# Patient Record
Sex: Female | Born: 1940 | Race: White | Hispanic: No | Marital: Married | Smoking: Never smoker
Health system: Southern US, Community
[De-identification: ages and names within clinical notes are randomized; demographics above are authoritative.]

## PROBLEM LIST (undated history)

## (undated) DIAGNOSIS — K449 Diaphragmatic hernia without obstruction or gangrene: Secondary | ICD-10-CM

## (undated) DIAGNOSIS — N3010 Interstitial cystitis (chronic) without hematuria: Secondary | ICD-10-CM

## (undated) DIAGNOSIS — G20A1 Parkinson's disease without dyskinesia, without mention of fluctuations: Secondary | ICD-10-CM

## (undated) DIAGNOSIS — N200 Calculus of kidney: Secondary | ICD-10-CM

## (undated) DIAGNOSIS — D649 Anemia, unspecified: Secondary | ICD-10-CM

## (undated) DIAGNOSIS — M545 Low back pain, unspecified: Secondary | ICD-10-CM

## (undated) DIAGNOSIS — R002 Palpitations: Secondary | ICD-10-CM

## (undated) DIAGNOSIS — T8131XA Disruption of external operation (surgical) wound, not elsewhere classified, initial encounter: Secondary | ICD-10-CM

## (undated) DIAGNOSIS — F988 Other specified behavioral and emotional disorders with onset usually occurring in childhood and adolescence: Secondary | ICD-10-CM

## (undated) DIAGNOSIS — Z8719 Personal history of other diseases of the digestive system: Secondary | ICD-10-CM

## (undated) DIAGNOSIS — G20 Parkinson's disease: Secondary | ICD-10-CM

## (undated) DIAGNOSIS — I639 Cerebral infarction, unspecified: Secondary | ICD-10-CM

## (undated) DIAGNOSIS — K225 Diverticulum of esophagus, acquired: Secondary | ICD-10-CM

## (undated) DIAGNOSIS — Z87440 Personal history of urinary (tract) infections: Secondary | ICD-10-CM

## (undated) DIAGNOSIS — J189 Pneumonia, unspecified organism: Secondary | ICD-10-CM

## (undated) DIAGNOSIS — R634 Abnormal weight loss: Secondary | ICD-10-CM

## (undated) DIAGNOSIS — K222 Esophageal obstruction: Secondary | ICD-10-CM

## (undated) DIAGNOSIS — K219 Gastro-esophageal reflux disease without esophagitis: Secondary | ICD-10-CM

## (undated) HISTORY — DX: Parkinson's disease: G20

## (undated) HISTORY — DX: Interstitial cystitis (chronic) without hematuria: N30.10

## (undated) HISTORY — DX: Low back pain: M54.5

## (undated) HISTORY — DX: Esophageal obstruction: K22.2

## (undated) HISTORY — PX: UPPER GASTROINTESTINAL ENDOSCOPY: SHX188

## (undated) HISTORY — PX: SEPTOPLASTY: SUR1290

## (undated) HISTORY — DX: Parkinson's disease without dyskinesia, without mention of fluctuations: G20.A1

## (undated) HISTORY — DX: Diverticulum of esophagus, acquired: K22.5

## (undated) HISTORY — DX: Personal history of urinary (tract) infections: Z87.440

## (undated) HISTORY — DX: Diaphragmatic hernia without obstruction or gangrene: K44.9

## (undated) HISTORY — DX: Other specified behavioral and emotional disorders with onset usually occurring in childhood and adolescence: F98.8

## (undated) HISTORY — PX: VAGINAL HYSTERECTOMY: SUR661

## (undated) HISTORY — DX: Anemia, unspecified: D64.9

## (undated) HISTORY — DX: Pneumonia, unspecified organism: J18.9

## (undated) HISTORY — DX: Disruption of external operation (surgical) wound, not elsewhere classified, initial encounter: T81.31XA

## (undated) HISTORY — DX: Palpitations: R00.2

## (undated) HISTORY — DX: Low back pain, unspecified: M54.50

## (undated) HISTORY — PX: ESOPHAGOGASTRODUODENOSCOPY: SHX1529

## (undated) HISTORY — PX: COLONOSCOPY: SHX174

## (undated) HISTORY — DX: Abnormal weight loss: R63.4

---

## 1998-02-15 ENCOUNTER — Other Ambulatory Visit: Admission: RE | Admit: 1998-02-15 | Discharge: 1998-02-15 | Payer: Self-pay | Admitting: Obstetrics and Gynecology

## 1999-02-01 ENCOUNTER — Encounter: Admission: RE | Admit: 1999-02-01 | Discharge: 1999-02-01 | Payer: Self-pay | Admitting: Neurosurgery

## 1999-02-01 ENCOUNTER — Encounter: Payer: Self-pay | Admitting: Neurosurgery

## 1999-02-03 ENCOUNTER — Encounter: Payer: Self-pay | Admitting: Neurosurgery

## 1999-02-03 ENCOUNTER — Inpatient Hospital Stay (HOSPITAL_COMMUNITY): Admission: RE | Admit: 1999-02-03 | Discharge: 1999-02-04 | Payer: Self-pay | Admitting: Neurosurgery

## 1999-05-30 ENCOUNTER — Other Ambulatory Visit: Admission: RE | Admit: 1999-05-30 | Discharge: 1999-05-30 | Payer: Self-pay | Admitting: Obstetrics and Gynecology

## 1999-12-22 ENCOUNTER — Encounter: Admission: RE | Admit: 1999-12-22 | Discharge: 1999-12-22 | Payer: Self-pay | Admitting: Neurosurgery

## 1999-12-22 ENCOUNTER — Encounter: Payer: Self-pay | Admitting: Neurosurgery

## 2000-08-03 ENCOUNTER — Encounter: Payer: Self-pay | Admitting: Obstetrics and Gynecology

## 2000-08-03 ENCOUNTER — Encounter: Admission: RE | Admit: 2000-08-03 | Discharge: 2000-08-03 | Payer: Self-pay | Admitting: Obstetrics and Gynecology

## 2000-10-05 ENCOUNTER — Ambulatory Visit (HOSPITAL_COMMUNITY): Admission: RE | Admit: 2000-10-05 | Discharge: 2000-10-05 | Payer: Self-pay | Admitting: *Deleted

## 2000-10-05 ENCOUNTER — Encounter: Payer: Self-pay | Admitting: *Deleted

## 2003-02-23 ENCOUNTER — Inpatient Hospital Stay (HOSPITAL_COMMUNITY): Admission: EM | Admit: 2003-02-23 | Discharge: 2003-02-24 | Payer: Self-pay | Admitting: Emergency Medicine

## 2003-07-05 ENCOUNTER — Ambulatory Visit (HOSPITAL_BASED_OUTPATIENT_CLINIC_OR_DEPARTMENT_OTHER): Admission: RE | Admit: 2003-07-05 | Discharge: 2003-07-05 | Payer: Self-pay | Admitting: Family Medicine

## 2003-08-07 ENCOUNTER — Encounter: Admission: RE | Admit: 2003-08-07 | Discharge: 2003-08-07 | Payer: Self-pay | Admitting: Family Medicine

## 2003-10-09 ENCOUNTER — Ambulatory Visit (HOSPITAL_COMMUNITY): Admission: RE | Admit: 2003-10-09 | Discharge: 2003-10-09 | Payer: Self-pay | Admitting: Otolaryngology

## 2005-02-07 ENCOUNTER — Ambulatory Visit: Payer: Self-pay | Admitting: Urology

## 2006-02-05 ENCOUNTER — Encounter: Admission: RE | Admit: 2006-02-05 | Discharge: 2006-02-05 | Payer: Self-pay | Admitting: Family Medicine

## 2006-02-10 ENCOUNTER — Emergency Department: Payer: Self-pay | Admitting: Emergency Medicine

## 2006-02-12 ENCOUNTER — Ambulatory Visit: Payer: Self-pay | Admitting: Family Medicine

## 2006-09-17 ENCOUNTER — Emergency Department: Payer: Self-pay | Admitting: Emergency Medicine

## 2007-01-04 ENCOUNTER — Ambulatory Visit: Payer: Self-pay | Admitting: Family Medicine

## 2007-01-11 ENCOUNTER — Ambulatory Visit: Payer: Self-pay | Admitting: Family Medicine

## 2009-08-18 ENCOUNTER — Ambulatory Visit: Payer: Self-pay | Admitting: Family Medicine

## 2011-01-30 ENCOUNTER — Encounter: Payer: Self-pay | Admitting: Internal Medicine

## 2011-02-22 ENCOUNTER — Encounter: Payer: Self-pay | Admitting: Internal Medicine

## 2011-02-22 ENCOUNTER — Ambulatory Visit (INDEPENDENT_AMBULATORY_CARE_PROVIDER_SITE_OTHER): Payer: Medicare Other | Admitting: Internal Medicine

## 2011-02-22 VITALS — BP 108/62 | HR 80 | Ht 65.5 in | Wt 195.2 lb

## 2011-02-22 DIAGNOSIS — K219 Gastro-esophageal reflux disease without esophagitis: Secondary | ICD-10-CM

## 2011-02-22 DIAGNOSIS — IMO0001 Reserved for inherently not codable concepts without codable children: Secondary | ICD-10-CM

## 2011-02-22 DIAGNOSIS — R6881 Early satiety: Secondary | ICD-10-CM

## 2011-02-22 DIAGNOSIS — R634 Abnormal weight loss: Secondary | ICD-10-CM

## 2011-02-22 NOTE — Progress Notes (Signed)
Subjective:    Patient ID: Stephanie [name], female    DOB: [birth year], 70 y.o.   MRN: [id number]  HPI This is a delightful 70 year old married white woman, a self-described tennis addict, who is here to discuss problems with reflux and early satiety. About 5 months ago, after a long history of good control of reflux using Prilosec 20 mg daily, she started having increasing regurgitation and reflux. She began belching more, and especially noticed this when she slept on her abdomen which she typically does. She tried diet modification. She began to notice early satiety and has lost about 12-15 pounds in the last several months. Sometimes she has a foul odor when she belches. Frankly she had been on Advil for a number of years, to help with her back pain, but her back pain felt better and she stopped Advil 5 months ago, about the time these problems began.  In the past she has had dysphagia and esophageal stricture dilated. She feels confident she has had a colonoscopy in the past 5 years though I don't have those records and cannot find them. She says there is minimal nausea and minimal diarrhea but overall no other GI symptoms, though I should say she does have some anorexia. She takes in minimal caffeine but says "I do eat chocolate".  .Review of Systems Positive for the back pain disappearing 5 months ago, she has some cough. She plays tennis without difficulty 5 days a week sometimes 2 times a day.  Allergies  Allergen Reactions  . Sulfa Antibiotics Rash   No outpatient prescriptions prior to visit.   Past Medical History  Diagnosis Date  . Interstitial cystitis   . Pneumonia   . Hx: UTI (urinary tract infection)    Past Surgical History  Procedure Date  . Back surgery     x 2  . Vaginal hysterectomy   . Cholecystectomy   . Septoplasty    History   Social History  . Marital Status: Married                 Social History Main Topics  . Smoking status: Never Smoker   .  Smokeless tobacco: Never Used  . Alcohol Use: Yes     4 ounces of wine daily  . Drug Use: No  .                 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 Colon cancer Neg Hx   . Ulcers Paternal Grandmother   . Ulcers Father   . Alcohol abuse Father           Objective:   Physical Exam General:  Well-developed, well-nourished and in no acute distress obese Eyes:  anicteric. ENT:   Mouth and posterior pharynx free of lesions.  Neck:   supple w/o thyromegaly or mass.  Lungs: Clear to auscultation bilaterally. Heart:  S1S2, no rubs, murmurs, gallops. Abdomen:  soft, non-tender, no hepatosplenomegaly, hernia, or mass and BS+. Small umbilical hernia Rectal: Lymph:  no cervical or supraclavicular adenopathy. Extremities:   no edema Skin   no rash. Neuro:  A&O x 3.  Psych:  appropriate mood and  Affect.   Data Reviewed: Prior operative notes hospitalization records        Assessment & Plan:  She has a constellation of early satiety and weight loss and increasing reflux symptoms. She needs an upper GI endoscopy  to look for the possibility of a gastric cancer, or other upper GI tract malignancy. It is statistically unlikely it certainly possible. She could have gastritis, peptic ulcer disease or some functional disturbance of the upper gut. We'll plan on possible esophageal dilation that she is not having overt dysphagia sometimes the symptoms can be a manifestation of an esophageal stricture though I don't think so.  The risks and benefits as well as alternatives of endoscopic procedure(s) have been discussed and reviewed. [name] questions answered. The patient agrees to proceed.

## 2011-02-22 NOTE — Patient Instructions (Signed)
You have been scheduled for an Endoscopy with separate instructions given.  

## 2011-02-23 ENCOUNTER — Ambulatory Visit (AMBULATORY_SURGERY_CENTER): Payer: Medicare Other | Admitting: Internal Medicine

## 2011-02-23 ENCOUNTER — Encounter: Payer: Self-pay | Admitting: Internal Medicine

## 2011-02-23 DIAGNOSIS — K259 Gastric ulcer, unspecified as acute or chronic, without hemorrhage or perforation: Secondary | ICD-10-CM

## 2011-02-23 DIAGNOSIS — R6881 Early satiety: Secondary | ICD-10-CM

## 2011-02-23 DIAGNOSIS — K2960 Other gastritis without bleeding: Secondary | ICD-10-CM

## 2011-02-23 DIAGNOSIS — K449 Diaphragmatic hernia without obstruction or gangrene: Secondary | ICD-10-CM

## 2011-02-23 DIAGNOSIS — K219 Gastro-esophageal reflux disease without esophagitis: Secondary | ICD-10-CM

## 2011-02-23 DIAGNOSIS — R634 Abnormal weight loss: Secondary | ICD-10-CM

## 2011-02-23 MED ORDER — SODIUM CHLORIDE 0.9 % IV SOLN: 500.0000 mL | INTRAVENOUS | Status: DC

## 2011-02-23 NOTE — Op Note (Signed)
[facility] [address] [place], [place]  [address]  ENDOSCOPY PROCEDURE REPORT  PATIENT:  Stephanie Bell, Stephanie Bell  MR#:  [id number] BIRTHDATE:  Jan 28, 1941, 70 yrs. old  GENDER:  female  ENDOSCOPIST:  Iva Boop, MD, [facility]  PROCEDURE DATE:  02/23/2011 PROCEDURE:  EGD, diagnostic [phone] ASA CLASS:  Class II INDICATIONS:  early satiety, weight loss, reflux symptoms despite therapy  MEDICATIONS:   These medications were titrated to patient response per physician's verbal order, Fentanyl 50 mcg IV, Versed 5 mg IV TOPICAL ANESTHETIC:  Cetacaine Spray  DESCRIPTION OF PROCEDURE:   After the risks benefits and alternatives of the procedure were thoroughly explained, informed consent was obtained.  The LB GIF-H180 [id number] endoscope was introduced through the mouth and advanced to the second portion of the duodenum, without limitations.  The instrument was slowly withdrawn as the mucosa was fully examined. <<PROCEDUREIMAGES>>  A hiatal hernia was found. It was 12 cm in size.  Multiple erosions were found in the body of the stomach. Linear erosions where diaphragm impinges upon gastric body (hiatal hernia). tortuous in the total esophagus.  Otherwise the examination was normal.    Retroflexed views revealed a hiatal hernia.    The scope was then withdrawn from the patient and the procedure completed.  COMPLICATIONS:  None  ENDOSCOPIC IMPRESSION: 1) 12 cm hiatal hernia 2) Erosions, multiple in the body of the stomach - related to diaphragmatic impingement ("Cameron's erosions") 3) Tortuous in the total esophagus 4) Otherwise normal examination RECOMMENDATIONS: Upper GI series to better characterize this hiatal hernia. Suspect she will need surgical evaluation to consider hiatal hernia repair.  Iva Boop, MD, [facility]  CC:  The Patient and [name], MD  n. Rosalie Doctor:   Iva Boop at 02/23/2011 03:09 PM  Everardo All, [phone]

## 2011-02-23 NOTE — Patient Instructions (Addendum)
You have a very large hiatal hernia. I think that may be the cause of your problems. My office will contact you about having a test called an upper GI series to get more information about this. It may be in your best interest to have this surgically repaired. I will let you know after the x-rays.  Iva Boop, MD, [facility]   FOLLOW DISCHARGE INSTRUCTIONS (BLUE & GREEN SHEETS).  Information on hiatal hernia given to you

## 2011-02-23 NOTE — Progress Notes (Signed)
Patient did not experience any of the following events: a burn prior to discharge; a fall within the facility; wrong site/side/patient/procedure/implant event; or a hospital transfer or hospital admission upon discharge from the facility. (G8907) Patient did not have preoperative order for IV antibiotic SSI prophylaxis. (G8918)  

## 2011-02-24 ENCOUNTER — Telehealth: Payer: Self-pay | Admitting: *Deleted

## 2011-02-24 NOTE — Telephone Encounter (Signed)
Follow up Call- Patient questions:  Do you have a fever, pain , or abdominal swelling? no Pain Score  0 *  Have you tolerated food without any problems? yes  Have you been able to return to your normal activities? yes  Do you have any questions about your discharge instructions: Diet   no Medications  no Follow up visit  no  Do you have questions or concerns about your Care? no  Actions: * If pain score is 4 or above: No action needed, pain <4.  Answered questions about the findings of the hiatal hernia and wanted to know if the Barium enema was invasive. Explained to her what the barium enema was and she verbalized understanding.

## 2011-02-27 ENCOUNTER — Telehealth: Payer: Self-pay | Admitting: *Deleted

## 2011-02-27 NOTE — Telephone Encounter (Signed)
Patient called in requesting a appointment with dr.magrinat patient has never seen the dr. Before patient stated she plays tennis with dr.magrinat and he stated for her to come in and see him explained to the patient that there is a process and I would get back with her after I talk to dr.magrinat

## 2011-02-28 ENCOUNTER — Other Ambulatory Visit: Payer: Medicare Other | Admitting: Lab

## 2011-02-28 ENCOUNTER — Ambulatory Visit: Payer: Medicare Other

## 2011-02-28 ENCOUNTER — Ambulatory Visit (HOSPITAL_BASED_OUTPATIENT_CLINIC_OR_DEPARTMENT_OTHER): Payer: Medicare Other | Admitting: Oncology

## 2011-02-28 VITALS — BP 135/78 | HR 80 | Temp 97.6°F | Ht 65.0 in | Wt 192.4 lb

## 2011-02-28 DIAGNOSIS — N61 Mastitis without abscess: Secondary | ICD-10-CM

## 2011-02-28 DIAGNOSIS — L0390 Cellulitis, unspecified: Secondary | ICD-10-CM

## 2011-02-28 NOTE — Progress Notes (Signed)
CC: Stephanie Bell    HPI: This is a 70 year old gets and the woman presenting today for concerns regarding a new chest wall mass.  The patient recently establish yourself with Stephanie Bell, who performed a colonoscopy and upper GI as detailed below. Shortly afterward, on December 6, she noted a new "bump" in the middle of her chest, which grew rapidly, was "black and blue and hot". She was very concerned that this might represent breast cancer. We were glad to be able to work her in today for further evaluation.  Past medical history:    Past Medical History  Diagnosis Date  . Interstitial cystitis   . Pneumonia   . Hx: UTI (urinary tract infection)    hiatal hernia with significant reflux symptoms, and history of esophageal stricture, dilated; history of peptic ulcer disease, H. pylori positive. History of nephrolithiasis, remote.  Past surgical history:     Past Surgical History  Procedure Date  . Back surgery     x 2  . Vaginal hysterectomy   . Cholecystectomy   . Septoplasty    also status post tonsillectomy and adenoidectomy and status post bilateral salpingo-oophorectomy at the time of her hysterectomy in 1992  Family history:    Family History  Problem Relation Age of Onset  . Colon cancer Neg Hx   . Ulcers Paternal Grandmother   . Ulcers Father   . Alcohol abuse Father    the patient's father died at the age of 76. He had a history of alcohol abuse. The patient's mother died at the age of 52 from "old age". The patient had no sisters. She had one brother who died from complications of chronic obstructive pulmonary disease March of 2011.  GYN history:  The patient is GX P3, when necessary age 51, first pregnancy to term age 35. She took Estratest before her hysterectomy, and not afterwards.  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 Her husband, Stephanie Bell, is a retired Chief Strategy Officer, working most recently at  [employer] for 21 years. Son Stephanie Bell, 52, lives in [place], where he used to work in Education officer, environmental, is now retired. Son Stephanie Bell, worked as a Chartered loss adjuster, is now retired. He is 50. Stephanie Bell, 47, lives in [place] where she works in [employer] The patient has 6 grandchildren she attends [place] [employer]  Health maintenance: Most recent mammogram more than a year ago. Never smoked. She used to drink a glass of wine a night, but has been avoiding that in the last few months because of reflux issues. She no longer gets Pap smears. Her bone densities have been uniformly normal. Most recent cholesterol was less than 200, and in any case she has a high HDL. She just had her colonoscopy and upper endoscopy under Stephanie Bell, showing no evidence of cancer. She has a living will in place although she has not reviewed it recently.  Allergies:  Allergies  Allergen Reactions  . Sulfa Antibiotics Rash    Current outpatient prescriptions:cholecalciferol (VITAMIN D) 1000 UNITS tablet, Take 2,000 Units by mouth daily.  , Disp: , Rfl: ;  methylphenidate (RITALIN) 10 MG tablet, 10 mg as needed. , Disp: , Rfl: ;  nitrofurantoin (MACRODANTIN) 100 MG capsule, Take 100 mg by mouth daily as needed.  , Disp: , Rfl: ;  omeprazole (PRILOSEC OTC) 20 MG tablet, Take 20 mg by mouth daily. , Disp: , Rfl:   ROS she has frequent urinary  tract infections, which she treats prophylactically. This has been evaluated with cystoscopy in the past, with no structural abnormality noted. She'll set has a reflux history, just evaluated by Stephanie Bell. She does have a significant hiatal hernia which may require surgical intervention. Otherwise an extensive review of systems today was noncontributory  Physical Exam: Middle-aged white woman who appears in no acute distress.  Blood pressure 135/78, pulse 80, temperature 97.6 F (36.4 C), height 5\' 5"  (1.651 m), weight 192 lb 6.4 oz (87.272 kg).  Sclerae unicteric Oropharynx  clear No peripheral adenopathy Lungs no rales or rhonchi Heart regular rate and rhythm Abd benign MSK no focal spinal tenderness, no peripheral edema; scars from lumbar laminectomy noted Neuro: nonfocal Breasts: In the medial aspect of the right breast there is an area that is indurated and swollen. There is frank pointing, with a slight purulent discharge. The erythematous area extends over 9 x 3 cm medially. The right breast itself is otherwise unremarkable as is the left breast. There is no nipple retraction or other suspicious masses.  LABS   No results found for this basename: wbc, rbc, hgb, hct, plt, mcv, mch, mchc, rdw, neutrabs, lymphsabs, monoabs, eosabs, basosabs      Chemistry   No results found for this basename: NA, K, CL, CO2, BUN, CREATININE, GLU   No results found for this basename: CALCIUM, ALKPHOS, AST, ALT, BILITOT     Studies:  Outside records reviewed.  Assessment: 70 year old [place] woman with an area of cellulitis involving the medial aspect of the right breast, emerging December 6, without apparent provocation.  Plan: We will go with Keflex 500 mg twice a day for 7 days. I will encourage the patient to use hot compresses and hot showers to help the drainage. Once the cellulitis has resolved, the patient should have bilateral mammography, which I expect to be unremarkable.  I am not making a return appointment for bed here, though of course I will be glad to see her at any point on an as-needed basis. I did ask her to check back with me regarding resolution of the current problem.  This plan is concordant with NCCN guidelines.   Stephanie Bell C 02/28/2011, 1:25 PM

## 2011-03-01 ENCOUNTER — Telehealth: Payer: Self-pay | Admitting: Internal Medicine

## 2011-03-01 ENCOUNTER — Ambulatory Visit: Payer: Medicare Other | Admitting: Oncology

## 2011-03-01 ENCOUNTER — Other Ambulatory Visit: Payer: Medicare Other | Admitting: Lab

## 2011-03-01 NOTE — Telephone Encounter (Signed)
Patient is scheduled for UGI 03/08/11 9:30, she is aware

## 2011-03-08 ENCOUNTER — Encounter: Payer: Self-pay | Admitting: Internal Medicine

## 2011-03-08 ENCOUNTER — Encounter: Payer: Self-pay | Admitting: *Deleted

## 2011-03-08 ENCOUNTER — Ambulatory Visit (HOSPITAL_COMMUNITY)
Admission: RE | Admit: 2011-03-08 | Discharge: 2011-03-08 | Disposition: A | Discharge status: Home / Self Care | Payer: Medicare Other | Source: Ambulatory Visit | Attending: Internal Medicine | Admitting: Internal Medicine

## 2011-03-08 ENCOUNTER — Telehealth: Payer: Self-pay | Admitting: Internal Medicine

## 2011-03-08 DIAGNOSIS — R1310 Dysphagia, unspecified: Secondary | ICD-10-CM | POA: Insufficient documentation

## 2011-03-08 DIAGNOSIS — K449 Diaphragmatic hernia without obstruction or gangrene: Secondary | ICD-10-CM | POA: Insufficient documentation

## 2011-03-08 NOTE — Progress Notes (Signed)
Quick Note:  Clarification - she is seeing Dr. Leonard Schwartz. Janee Morn tomorrow so he can discuss hernia repair with her (seeing him for other issue) - not sure he does lap hernia repairs  Manometry can wait til surgeon requests. ______

## 2011-03-08 NOTE — Progress Notes (Signed)
Quick Note:  Please let her know that this confirms that the hiatal hernia is large, it is twisting (next to esophagus) and is causing her problems. It will require surgery to repair this. Please make the referral for her - reason is to evaluate and treat paraesophageal hernia. Go ahead and schedule, and explain a pre-op esophageal manometry also, please ______

## 2011-03-08 NOTE — Progress Notes (Unsigned)
Per MD request this RN called to [facility] and scheduled pt to be seen by Dr Janee Morn 03/09/2011 at 345 to be at clinic at 315.  This RN informed Kendell Bane pt's information is updated in EPIC.  Pt given written instructions including address, directions and phone number of office.

## 2011-03-08 NOTE — Telephone Encounter (Signed)
If patient needs to be seen by a surgeon for her hiatal hernia, she wants to see Dr Elwyn Lade at [facility].  She has an appt tomorrow with him for another matter.  She wanted him to take a look at UGI results.  I have advised her that if he needs to take a look he will be able to see the results and I will call her with Dr Marvell Fuller recommendations.

## 2011-03-09 ENCOUNTER — Other Ambulatory Visit (INDEPENDENT_AMBULATORY_CARE_PROVIDER_SITE_OTHER): Payer: Self-pay | Admitting: General Surgery

## 2011-03-09 ENCOUNTER — Encounter (INDEPENDENT_AMBULATORY_CARE_PROVIDER_SITE_OTHER): Payer: Self-pay | Admitting: General Surgery

## 2011-03-09 ENCOUNTER — Ambulatory Visit (INDEPENDENT_AMBULATORY_CARE_PROVIDER_SITE_OTHER): Payer: Medicare Other | Admitting: General Surgery

## 2011-03-09 DIAGNOSIS — K449 Diaphragmatic hernia without obstruction or gangrene: Secondary | ICD-10-CM

## 2011-03-09 DIAGNOSIS — L089 Local infection of the skin and subcutaneous tissue, unspecified: Secondary | ICD-10-CM

## 2011-03-09 DIAGNOSIS — L723 Sebaceous cyst: Secondary | ICD-10-CM

## 2011-03-09 NOTE — Progress Notes (Signed)
Subjective:     Patient ID: Stephanie Bell, female   DOB: 1941-03-19, 70 y.o.   MRN: [id number]  HPI Patient is known to me status post laparoscopic cholecystectomy in the past. She developed a rash on her chest after endoscopy. She then developed an area of infection on her chest wall. This has been draining purulent material. She's also been undergoing workup for epigastric discomfort. Barium swallow revealed a hiatal hernia which is symptomatic. This would be addressed surgically. Dr. [name] has requested we have her see someone for a laparoscopic approach for that.  Review of Systems     Objective:   Physical Exam  Cardiovascular: Normal rate and normal heart sounds.   Pulmonary/Chest: Effort normal and breath sounds normal. No respiratory distress. She has no wheezes.  Abdominal: Soft. She exhibits no distension. There is no tenderness.  Chest wall near the sternum on the edge of the right breast has an inflamed infected sebaceous cyst. There are a couple of sinuses over the top on the skin. There was prepped in sterile fashion. Local was injected. The area was incised and drained. Purulent sebum was removed. I was also able to debride some of the cyst wall. The wound is cleaned out. It was packed with iodoform gauze. A gauze dressing was applied.     Assessment:     #1 infected sebaceous cyst #2 symptomatic hiatal hernia    Plan:     In regards to her cyst, instructions were given. She will continue her doxycycline. We will followup the culture. She will remove the packing and Saturday. We will have her seen back in our urgent clinic on Monday morning. He regards to her hiatal hernia, or have her see Dr. Daphine Deutscher next month to evaluate for surgery. She will need this infection to resolve prior to the procedure.

## 2011-03-09 NOTE — Patient Instructions (Signed)
Keep area covered with gauze. Remove packing Saturday.

## 2011-03-09 NOTE — Progress Notes (Signed)
Addended by: Latricia Heft on: 03/09/2011 04:00 PM   Modules accepted: Orders

## 2011-03-12 LAB — WOUND CULTURE
Gram Stain: NONE SEEN
Organism ID, Bacteria: NO GROWTH

## 2011-03-13 ENCOUNTER — Encounter (INDEPENDENT_AMBULATORY_CARE_PROVIDER_SITE_OTHER): Payer: Self-pay | Admitting: Surgery

## 2011-03-13 ENCOUNTER — Ambulatory Visit (INDEPENDENT_AMBULATORY_CARE_PROVIDER_SITE_OTHER): Payer: Medicare Other | Admitting: Surgery

## 2011-03-13 VITALS — BP 125/68 | HR 78 | Temp 98.6°F | Resp 18 | Ht 65.5 in | Wt 191.8 lb

## 2011-03-13 DIAGNOSIS — Z09 Encounter for follow-up examination after completed treatment for conditions other than malignant neoplasm: Secondary | ICD-10-CM

## 2011-03-13 NOTE — Progress Notes (Signed)
Subjective:     Patient ID: Stephanie Bell, female   DOB: 1940/08/30, 70 y.o.   MRN: [id number]  HPI She is here for a visit status post incision and drainage of infected sebaceous cyst on her chest by Dr. Janee Morn last week. She is still on antibiotics. 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 She has no complaints  Review of Systems     Objective:   Physical Exam On exam, the wound is clean. There is no erythema or purulence.    Assessment:     Patient status post incision and drainage of infected sebaceous cyst    Plan:     She will continue her local wound care. She is being set up to see Dr. Daphine Deutscher next month regarding her hiatal hernia. She will come back and see [place] should the cyst flareup

## 2011-04-07 ENCOUNTER — Other Ambulatory Visit (INDEPENDENT_AMBULATORY_CARE_PROVIDER_SITE_OTHER): Payer: Self-pay | Admitting: Surgery

## 2011-04-07 ENCOUNTER — Ambulatory Visit (INDEPENDENT_AMBULATORY_CARE_PROVIDER_SITE_OTHER): Payer: Medicare Other | Admitting: Surgery

## 2011-04-07 VITALS — BP 118/70 | HR 100 | Temp 97.0°F | Resp 16 | Ht 60.0 in | Wt 196.2 lb

## 2011-04-07 DIAGNOSIS — K440 Diaphragmatic hernia with obstruction, without gangrene: Secondary | ICD-10-CM

## 2011-04-07 NOTE — Patient Instructions (Signed)
Continue GER treatment until surgery

## 2011-04-07 NOTE — Progress Notes (Signed)
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 [name].  She is no stranger to our practice as we had done a laparoscopic cholecystectomy on her back in 2004 by Dr. Janee Morn and recently she had an infected sebaceous cyst between her breast that was drained by him as well. She is seen [name] because of pressure and swallowing issues and also chronic cough that probably is related to a large paraesophageal hiatal hernia. I reviewed her upper GI and she has probably a third of her stomach up in her chest. Prior to this appointment she has observed the procedure on U. Tube.  She is aware of the seriousness of the operation which I went ahead and described graphically to her including drawings and a booklet to we have for patient's. 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  She has a residual sebaceous cyst between her breasts and would like to have that excised at the same time as her hiatal hernia repair and probable Nissen fundoplication. We'll schedule both.  Past Medical History  Diagnosis Date  . Interstitial cystitis   . Pneumonia   . Hx: UTI (urinary tract infection)   . Paraesophageal hernia   . Weight loss   . Wound disruption, post-op, skin     Past Surgical History  Procedure Date  . Back surgery     x 2  . Vaginal hysterectomy   . Cholecystectomy   . Septoplasty     Current Outpatient Prescriptions  Medication Sig Dispense Refill  . cholecalciferol (VITAMIN D) 1000 UNITS tablet Take 2,000 Units by mouth daily.        . methylphenidate (RITALIN) 10 MG tablet 10 mg as needed.       . nitrofurantoin (MACRODANTIN) 100 MG capsule Take 100 mg by mouth daily as needed.        Marland Kitchen omeprazole (PRILOSEC OTC) 20 MG tablet Take 20  mg by mouth daily.       Marland Kitchen doxycycline (DORYX) 100 MG DR capsule Take 100 mg by mouth 2 (two) times daily.         Sulfa antibiotics Family History  Problem Relation Age of Onset  . Colon cancer Neg Hx   . Ulcers Paternal Grandmother   . Ulcers Father   . Alcohol abuse Father    Social History:   reports that she has never smoked. She has never used smokeless tobacco. She reports that she drinks alcohol. She reports that she does not use illicit drugs.   REVIEW OF SYSTEMS - PERTINENT POSITIVES ONLY: noncontributory  Physical Exam:   Blood pressure 118/70, pulse 100, temperature 97 F (36.1 C), resp. rate 16, height 5' (1.524 m), weight 196 lb 3.2 oz (88.996 kg). Body mass index is 38.32 kg/(m^2).  Gen:  WDWN white female NAD  Neurological: Alert and oriented to person, place, and time. Motor and sensory function is grossly intact  Head: Normocephalic and atraumatic.  Eyes: Conjunctivae are normal. Pupils are equal, round, and reactive to light. No scleral icterus.  Neck: Normal range of motion. Neck supple. No tracheal deviation or thyromegaly present.  Cardiovascular:  SR without murmurs or gallops.  No carotid bruits Respiratory: Effort normal.  No respiratory distress. No  chest wall tenderness. Breath sounds normal.  No wheezes, rales or rhonchi.  Sebaceous cyst remnant between breasts with punctum Abdomen:  nontender GU: Musculoskeletal: Normal range of motion. Extremities are nontender. No cyanosis, edema or clubbing noted Lymphadenopathy: No cervical, preauricular, postauricular or axillary adenopathy is present Skin: Skin is warm and dry. No rash noted. No diaphoresis. No erythema. No pallor. Pscyh: Normal mood and affect. Behavior is normal. Judgment and thought content normal.   LABORATORY RESULTS: No results found for this or any previous visit (from the past 48 hour(s)).  RADIOLOGY RESULTS: No results found.  Problem List: Patient Active Problem List  Diagnoses    . Paraesophageal hernia  . Infected sebaceous cyst  . Hiatal hernia    Assessment & Plan: Moderately large mixed3 hiatal hernia. Sebaceous cyst Plan laparoscopic repair of hiatal hernia and sebaceous cyst removal. Patient aware of the procedure might have to be done open for planned to do this laparoscopically.    [name], MD, [facility] [phone] beeper [phone]  04/07/2011 9:53 AM

## 2011-04-26 ENCOUNTER — Telehealth (INDEPENDENT_AMBULATORY_CARE_PROVIDER_SITE_OTHER): Payer: Self-pay | Admitting: Surgery

## 2011-05-08 ENCOUNTER — Encounter (HOSPITAL_COMMUNITY): Payer: Self-pay | Admitting: Pharmacy Technician

## 2011-05-09 ENCOUNTER — Encounter (HOSPITAL_COMMUNITY)
Admission: RE | Admit: 2011-05-09 | Discharge: 2011-05-09 | Disposition: A | Discharge status: Home / Self Care | Payer: Medicare Other | Source: Ambulatory Visit | Attending: Surgery | Admitting: Surgery

## 2011-05-09 ENCOUNTER — Encounter (HOSPITAL_COMMUNITY): Payer: Self-pay

## 2011-05-09 DIAGNOSIS — J189 Pneumonia, unspecified organism: Secondary | ICD-10-CM

## 2011-05-09 HISTORY — PX: CHOLECYSTECTOMY: SHX55

## 2011-05-09 HISTORY — PX: TONSILLECTOMY: SUR1361

## 2011-05-09 HISTORY — PX: BACK SURGERY: SHX140

## 2011-05-09 HISTORY — DX: Pneumonia, unspecified organism: J18.9

## 2011-05-09 LAB — SURGICAL PCR SCREEN
MRSA, PCR: NEGATIVE
Staphylococcus aureus: NEGATIVE

## 2011-05-09 LAB — CBC
HCT: 33.6 % — ABNORMAL LOW (ref 36.0–46.0)
Hemoglobin: 10.8 g/dL — ABNORMAL LOW (ref 12.0–15.0)
MCH: 23.4 pg — ABNORMAL LOW (ref 26.0–34.0)
MCHC: 32.1 g/dL (ref 30.0–36.0)
MCV: 72.7 fL — ABNORMAL LOW (ref 78.0–100.0)
Platelets: 152 10*3/uL (ref 150–400)
RBC: 4.62 MIL/uL (ref 3.87–5.11)
RDW: 15.3 % (ref 11.5–15.5)
WBC: 5.5 10*3/uL (ref 4.0–10.5)

## 2011-05-09 MED ORDER — CHLORHEXIDINE GLUCONATE 4 % EX LIQD
1.0000 "application " | Freq: Once | CUTANEOUS | Status: DC
  Filled 2011-05-09: qty 15

## 2011-05-09 NOTE — Patient Instructions (Signed)
20 Stephanie Bell  05/09/2011   Your procedure is scheduled on: 05-16-11  Report to [facility] at        0515 AM.  Call this number if you have problems the morning of surgery: [phone]   Remember: to bring copy  living will.   Do not eat food:After Midnight.  .  Take these medicines the morning of surgery with A SIP OF WATER: none.   Do not wear jewelry, make-up or nail polish.  Do not wear lotions, powders, or perfumes. You may wear deodorant.  Do not shave 48 hours prior to surgery.(no shaving of legs)  Do not bring valuables to the hospital.  Contacts, dentures or bridgework may not be worn into surgery.  Leave suitcase in the car. After surgery it may be brought to your room.  For patients admitted to the hospital, checkout time is 11:00 AM the day of discharge.   Patients discharged the day of surgery will not be allowed to drive home.  Name and phone number of your driver:   Special Instructions: CHG Shower Use Special Wash: 1/2 bottle night before surgery and 1/2 bottle morning of surgery.(donot use on face and genetalia) Follow Fleet enema instructions night before surgery.   Please read over the following fact sheets that you were given: MRSA Information

## 2011-05-09 NOTE — Pre-Procedure Instructions (Signed)
05-09-11 No EKG or CXR required per anesthesia.

## 2011-05-15 NOTE — H&P (Addendum)
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 [name]. She is no stranger to our practice as we had done a laparoscopic cholecystectomy on her back in 2004 by Dr. Janee Morn and recently she had an infected sebaceous cyst between her breast that was drained by him as well. She is seen [name] because of pressure and swallowing issues and also chronic cough that probably is related to a large paraesophageal hiatal hernia. I reviewed her upper GI and she has probably a third of her stomach up in her chest. Prior to this appointment she has observed the procedure on U. Tube. She is aware of the seriousness of the operation which I went ahead and described graphically to her including drawings and a booklet to we have for patient's. 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 She has a residual sebaceous cyst between her breasts and would like to have that excised at the same time as her hiatal hernia repair and probable Nissen fundoplication. We'll schedule both.  Past Medical History   Diagnosis  Date   .  Interstitial cystitis    .  Pneumonia    .  Hx: UTI (urinary tract infection)    .  Paraesophageal hernia    .  Weight loss    .  Wound disruption, post-op, skin     Past Surgical History   Procedure  Date   .  Back surgery      x 2   .  Vaginal hysterectomy    .  Cholecystectomy    .  Septoplasty     Current Outpatient Prescriptions   Medication  Sig  Dispense  Refill   .  cholecalciferol (VITAMIN D) 1000 UNITS tablet  Take 2,000 Units by mouth daily.     .  methylphenidate (RITALIN) 10 MG tablet  10 mg as needed.     .  nitrofurantoin (MACRODANTIN) 100 MG capsule  Take 100 mg by mouth daily as needed.     Marland Kitchen  omeprazole (PRILOSEC OTC)  20 MG tablet  Take 20 mg by mouth daily.     Marland Kitchen  doxycycline (DORYX) 100 MG DR capsule  Take 100 mg by mouth 2 (two) times daily.      Sulfa antibiotics  Family History   Problem  Relation  Age of Onset   .  Colon cancer  Neg Hx    .  Ulcers  Paternal Grandmother    .  Ulcers  Father    .  Alcohol abuse  Father     Social History: reports that she has never smoked. She has never used smokeless tobacco. She reports that she drinks alcohol. She reports that she does not use illicit drugs.  REVIEW OF SYSTEMS - PERTINENT POSITIVES ONLY:  noncontributory  Physical Exam:  Blood pressure 118/70, pulse 100, temperature 97 F (36.1 C), resp. rate 16, height 5' (1.524 m), weight 196 lb 3.2 oz (88.996 kg).  Body mass index is 38.32 kg/(m^2).  Gen: WDWN white female NAD  Neurological: Alert and oriented to person, place, and time. Motor and sensory function is grossly intact  Head: Normocephalic and atraumatic.  Eyes: Conjunctivae are normal. Pupils are equal, round, and reactive to light. No  scleral icterus.  Neck: Normal range of motion. Neck supple. No tracheal deviation or thyromegaly present.  Cardiovascular: SR without murmurs or gallops. No carotid bruits  Respiratory: Effort normal. No respiratory distress. No chest wall tenderness. Breath sounds normal. No wheezes, rales or rhonchi. Sebaceous cyst remnant between breasts with punctum  Abdomen: nontender  GU:  Musculoskeletal: Normal range of motion. Extremities are nontender. No cyanosis, edema or clubbing noted Lymphadenopathy: No cervical, preauricular, postauricular or axillary adenopathy is present Skin: Skin is warm and dry. No rash noted. No diaphoresis. No erythema. No pallor. Pscyh: Normal mood and affect. Behavior is normal. Judgment and thought content normal.  LABORATORY RESULTS:  No results found for this or any previous visit (from the past 48 hour(s)).  RADIOLOGY RESULTS:  No results found.  Problem List:  Patient Active  Problem List   Diagnoses   .  Paraesophageal hernia   .  Infected sebaceous cyst   .  Hiatal hernia    Assessment & Plan:  Moderately large mixed3 hiatal hernia. Sebaceous cyst  Plan laparoscopic repair of hiatal hernia and sebaceous cyst removal. Patient aware of the procedure might have to be done open for planned to do this laparoscopically.  [name], MD, [facility]  [phone] beeper  [phone]  There has been no change in the patient's past medical history or physical exam in the past 24 hours to the best of my knowledge. I examined the patient in the holding area and have made any changes to the history and physical exam report that is included above.   Expectations and outcome results have been discussed with the patient to include risks and benefits.  All questions have been answered and we will proceed with previously discussed procedure noted and signed in the consent form in the patient's record.    Adriana Quinby BMD FACS 7:32 AM  05/16/2011

## 2011-05-16 ENCOUNTER — Other Ambulatory Visit: Payer: Self-pay

## 2011-05-16 ENCOUNTER — Encounter (HOSPITAL_COMMUNITY): Payer: Self-pay | Admitting: *Deleted

## 2011-05-16 ENCOUNTER — Inpatient Hospital Stay (HOSPITAL_COMMUNITY): Payer: Medicare Other | Admitting: Anesthesiology

## 2011-05-16 ENCOUNTER — Inpatient Hospital Stay (HOSPITAL_COMMUNITY)
Admission: RE | Admit: 2011-05-16 | Discharge: 2011-05-18 | DRG: 328 | Disposition: A | Discharge status: Home / Self Care | Payer: Medicare Other | Source: Ambulatory Visit | Attending: Surgery | Admitting: Surgery

## 2011-05-16 ENCOUNTER — Encounter (HOSPITAL_COMMUNITY)
Admission: RE | Disposition: A | Discharge status: Home / Self Care | Payer: Self-pay | Source: Ambulatory Visit | Attending: Surgery

## 2011-05-16 ENCOUNTER — Encounter (HOSPITAL_COMMUNITY): Payer: Self-pay | Admitting: Anesthesiology

## 2011-05-16 DIAGNOSIS — Z8701 Personal history of pneumonia (recurrent): Secondary | ICD-10-CM

## 2011-05-16 DIAGNOSIS — K449 Diaphragmatic hernia without obstruction or gangrene: Principal | ICD-10-CM | POA: Diagnosis present

## 2011-05-16 DIAGNOSIS — R05 Cough: Secondary | ICD-10-CM | POA: Diagnosis present

## 2011-05-16 DIAGNOSIS — Z87440 Personal history of urinary (tract) infections: Secondary | ICD-10-CM

## 2011-05-16 DIAGNOSIS — K219 Gastro-esophageal reflux disease without esophagitis: Secondary | ICD-10-CM | POA: Diagnosis present

## 2011-05-16 DIAGNOSIS — N3010 Interstitial cystitis (chronic) without hematuria: Secondary | ICD-10-CM | POA: Diagnosis present

## 2011-05-16 DIAGNOSIS — Z01812 Encounter for preprocedural laboratory examination: Secondary | ICD-10-CM

## 2011-05-16 DIAGNOSIS — N6089 Other benign mammary dysplasias of unspecified breast: Secondary | ICD-10-CM | POA: Diagnosis present

## 2011-05-16 DIAGNOSIS — K429 Umbilical hernia without obstruction or gangrene: Secondary | ICD-10-CM | POA: Diagnosis present

## 2011-05-16 DIAGNOSIS — R059 Cough, unspecified: Secondary | ICD-10-CM | POA: Diagnosis present

## 2011-05-16 DIAGNOSIS — L723 Sebaceous cyst: Secondary | ICD-10-CM

## 2011-05-16 HISTORY — PX: IRRIGATION AND DEBRIDEMENT SEBACEOUS CYST: SHX5255

## 2011-05-16 HISTORY — PX: UMBILICAL HERNIA REPAIR: SHX196

## 2011-05-16 HISTORY — PX: HERNIA REPAIR: SHX51

## 2011-05-16 HISTORY — PX: LAPAROSCOPIC NISSEN FUNDOPLICATION: SHX1932

## 2011-05-16 LAB — CBC
HCT: 33.3 % — ABNORMAL LOW (ref 36.0–46.0)
Hemoglobin: 10.5 g/dL — ABNORMAL LOW (ref 12.0–15.0)
MCH: 23.3 pg — ABNORMAL LOW (ref 26.0–34.0)
MCHC: 31.5 g/dL (ref 30.0–36.0)

## 2011-05-16 SURGERY — FUNDOPLICATION, NISSEN, LAPAROSCOPIC
Anesthesia: General | Site: Chest | Wound class: Clean

## 2011-05-16 MED ORDER — CISATRACURIUM BESYLATE 2 MG/ML IV SOLN
INTRAVENOUS | Status: DC | PRN
  Administered 2011-05-16 (×2): 2 mg via INTRAVENOUS
  Administered 2011-05-16: 5 mg via INTRAVENOUS
  Administered 2011-05-16: 2 mg via INTRAVENOUS
  Administered 2011-05-16: 5 mg via INTRAVENOUS

## 2011-05-16 MED ORDER — DEXAMETHASONE SODIUM PHOSPHATE 10 MG/ML IJ SOLN
INTRAMUSCULAR | Status: DC | PRN
  Administered 2011-05-16: 10 mg via INTRAVENOUS

## 2011-05-16 MED ORDER — MORPHINE SULFATE 2 MG/ML IJ SOLN
2.0000 mg | INTRAMUSCULAR | Status: DC | PRN
  Administered 2011-05-16 – 2011-05-17 (×8): 2 mg via INTRAVENOUS
  Filled 2011-05-16 (×8): qty 1

## 2011-05-16 MED ORDER — BUPIVACAINE LIPOSOME 1.3 % IJ SUSP
20.0000 mL | Freq: Once | INTRAMUSCULAR | Status: DC
  Filled 2011-05-16: qty 20

## 2011-05-16 MED ORDER — GLYCOPYRROLATE 0.2 MG/ML IJ SOLN
INTRAMUSCULAR | Status: DC | PRN
  Administered 2011-05-16: .4 mg via INTRAVENOUS

## 2011-05-16 MED ORDER — ONDANSETRON HCL 4 MG/2ML IJ SOLN: 4.0000 mg | Freq: Four times a day (QID) | INTRAMUSCULAR | Status: DC | PRN

## 2011-05-16 MED ORDER — PROMETHAZINE HCL 25 MG/ML IJ SOLN
6.2500 mg | INTRAMUSCULAR | Status: DC | PRN
  Administered 2011-05-16: 6.25 mg via INTRAVENOUS

## 2011-05-16 MED ORDER — DROPERIDOL 2.5 MG/ML IJ SOLN
INTRAMUSCULAR | Status: DC | PRN
  Administered 2011-05-16: .5 mg via INTRAVENOUS

## 2011-05-16 MED ORDER — PROMETHAZINE HCL 25 MG/ML IJ SOLN
INTRAMUSCULAR | Status: AC
  Filled 2011-05-16: qty 1

## 2011-05-16 MED ORDER — LIDOCAINE HCL (CARDIAC) 20 MG/ML IV SOLN
INTRAVENOUS | Status: DC | PRN
  Administered 2011-05-16: 20 mg via INTRAVENOUS

## 2011-05-16 MED ORDER — RINGERS IRRIGATION IR SOLN
Status: DC | PRN
  Administered 2011-05-16: 1000 mL

## 2011-05-16 MED ORDER — ACETAMINOPHEN 10 MG/ML IV SOLN
INTRAVENOUS | Status: DC | PRN
  Administered 2011-05-16: 1000 mg via INTRAVENOUS

## 2011-05-16 MED ORDER — HEPARIN SODIUM (PORCINE) 5000 UNIT/ML IJ SOLN
5000.0000 [IU] | Freq: Three times a day (TID) | INTRAMUSCULAR | Status: DC
  Administered 2011-05-16 – 2011-05-18 (×5): 5000 [IU] via SUBCUTANEOUS
  Filled 2011-05-16 (×8): qty 1

## 2011-05-16 MED ORDER — KETAMINE HCL 10 MG/ML IJ SOLN
INTRAMUSCULAR | Status: DC | PRN
  Administered 2011-05-16 (×4): 5 mg via INTRAVENOUS

## 2011-05-16 MED ORDER — DEXTROSE 5 % IV SOLN
2.0000 g | INTRAVENOUS | Status: AC
  Administered 2011-05-16: 2 g via INTRAVENOUS
  Filled 2011-05-16: qty 2

## 2011-05-16 MED ORDER — KCL IN DEXTROSE-NACL 20-5-0.45 MEQ/L-%-% IV SOLN
INTRAVENOUS | Status: AC
  Filled 2011-05-16: qty 1000

## 2011-05-16 MED ORDER — KCL IN DEXTROSE-NACL 20-5-0.45 MEQ/L-%-% IV SOLN
INTRAVENOUS | Status: DC
  Administered 2011-05-16 – 2011-05-18 (×5): via INTRAVENOUS
  Filled 2011-05-16 (×7): qty 1000

## 2011-05-16 MED ORDER — HYDROMORPHONE HCL PF 1 MG/ML IJ SOLN
INTRAMUSCULAR | Status: DC | PRN
  Administered 2011-05-16: 0.5 mg via INTRAVENOUS
  Administered 2011-05-16 (×5): .2 mg via INTRAVENOUS
  Administered 2011-05-16: 0.5 mg via INTRAVENOUS

## 2011-05-16 MED ORDER — CEFOXITIN SODIUM-DEXTROSE 1-4 GM-% IV SOLR (PREMIX)
INTRAVENOUS | Status: AC
  Filled 2011-05-16: qty 100

## 2011-05-16 MED ORDER — FENTANYL CITRATE 0.05 MG/ML IJ SOLN
INTRAMUSCULAR | Status: DC | PRN
  Administered 2011-05-16: 50 ug via INTRAVENOUS
  Administered 2011-05-16: 25 ug via INTRAVENOUS
  Administered 2011-05-16: 75 ug via INTRAVENOUS
  Administered 2011-05-16: 25 ug via INTRAVENOUS
  Administered 2011-05-16: 75 ug via INTRAVENOUS

## 2011-05-16 MED ORDER — HEPARIN SODIUM (PORCINE) 5000 UNIT/ML IJ SOLN
5000.0000 [IU] | Freq: Once | INTRAMUSCULAR | Status: AC
  Administered 2011-05-16: 5000 [IU] via SUBCUTANEOUS

## 2011-05-16 MED ORDER — MORPHINE SULFATE 2 MG/ML IJ SOLN
1.0000 mg | INTRAMUSCULAR | Status: DC | PRN
  Administered 2011-05-16 (×3): 1 mg via INTRAVENOUS
  Filled 2011-05-16 (×3): qty 1

## 2011-05-16 MED ORDER — ACETAMINOPHEN 10 MG/ML IV SOLN
INTRAVENOUS | Status: AC
  Filled 2011-05-16: qty 100

## 2011-05-16 MED ORDER — HYDROMORPHONE HCL PF 1 MG/ML IJ SOLN: 0.2500 mg | INTRAMUSCULAR | Status: DC | PRN

## 2011-05-16 MED ORDER — SUCCINYLCHOLINE CHLORIDE 20 MG/ML IJ SOLN
INTRAMUSCULAR | Status: DC | PRN
  Administered 2011-05-16: 100 mg via INTRAVENOUS

## 2011-05-16 MED ORDER — ONDANSETRON HCL 4 MG PO TABS: 4.0000 mg | ORAL_TABLET | Freq: Four times a day (QID) | ORAL | Status: DC | PRN

## 2011-05-16 MED ORDER — LACTATED RINGERS IV SOLN
INTRAVENOUS | Status: DC | PRN
  Administered 2011-05-16 (×2): via INTRAVENOUS

## 2011-05-16 MED ORDER — ONDANSETRON HCL 4 MG/2ML IJ SOLN
INTRAMUSCULAR | Status: DC | PRN
  Administered 2011-05-16 (×2): 2 mg via INTRAVENOUS

## 2011-05-16 MED ORDER — EPHEDRINE SULFATE 50 MG/ML IJ SOLN
INTRAMUSCULAR | Status: DC | PRN
  Administered 2011-05-16 (×3): 5 mg via INTRAVENOUS
  Administered 2011-05-16: 10 mg via INTRAVENOUS

## 2011-05-16 MED ORDER — PROPOFOL 10 MG/ML IV EMUL
INTRAVENOUS | Status: DC | PRN
  Administered 2011-05-16: 150 mg via INTRAVENOUS

## 2011-05-16 MED ORDER — NEOSTIGMINE METHYLSULFATE 1 MG/ML IJ SOLN
INTRAMUSCULAR | Status: DC | PRN
  Administered 2011-05-16: 3 mg via INTRAVENOUS

## 2011-05-16 SURGICAL SUPPLY — 63 items
APL SKNCLS STERI-STRIP NONHPOA (GAUZE/BANDAGES/DRESSINGS) ×2
APPLIER CLIP ROT 10 11.4 M/L (STAPLE)
APR CLP MED LRG 11.4X10 (STAPLE)
BENZOIN TINCTURE PRP APPL 2/3 (GAUZE/BANDAGES/DRESSINGS) ×3 IMPLANT
CABLE HIGH FREQUENCY MONO STRZ (ELECTRODE) ×1 IMPLANT
CANISTER SUCTION 2500CC (MISCELLANEOUS) IMPLANT
CLAMP ENDO BABCK 10MM (STAPLE) IMPLANT
CLIP APPLIE ROT 10 11.4 M/L (STAPLE) IMPLANT
CLOSURE STERI STRIP 1/2 X4 (GAUZE/BANDAGES/DRESSINGS) ×1 IMPLANT
CLOTH BEACON ORANGE TIMEOUT ST (SAFETY) ×3 IMPLANT
COVER SURGICAL LIGHT HANDLE (MISCELLANEOUS) ×3 IMPLANT
DECANTER SPIKE VIAL GLASS SM (MISCELLANEOUS) ×3 IMPLANT
DEVICE SUT QUICK LOAD TK 5 (STAPLE) ×6 IMPLANT
DEVICE SUT TI-KNOT TK 5X26 (MISCELLANEOUS) ×1 IMPLANT
DEVICE SUTURE ENDOST 10MM (ENDOMECHANICALS) ×3 IMPLANT
DISSECTOR BLUNT TIP ENDO 5MM (MISCELLANEOUS) ×3 IMPLANT
DRAIN PENROSE 18X1/2 LTX STRL (DRAIN) ×3 IMPLANT
DRAPE LAPAROSCOPIC ABDOMINAL (DRAPES) ×3 IMPLANT
ELECT REM PT RETURN 9FT ADLT (ELECTROSURGICAL) ×3
ELECTRODE REM PT RTRN 9FT ADLT (ELECTROSURGICAL) ×2 IMPLANT
FELT TEFLON 4 X1 (Mesh General) ×3 IMPLANT
FILTER SMOKE EVAC LAPAROSHD (FILTER) ×1 IMPLANT
GLOVE BIOGEL M 8.0 STRL (GLOVE) ×3 IMPLANT
GLOVE BIOGEL PI IND STRL 7.0 (GLOVE) IMPLANT
GLOVE BIOGEL PI INDICATOR 7.0 (GLOVE)
GOWN STRL NON-REIN LRG LVL3 (GOWN DISPOSABLE) ×3 IMPLANT
GOWN STRL REIN XL XLG (GOWN DISPOSABLE) ×6 IMPLANT
GRASPER ENDO BABCOCK 10 (MISCELLANEOUS) IMPLANT
GRASPER ENDO BABCOCK 10MM (MISCELLANEOUS)
HAND ACTIVATED (MISCELLANEOUS) ×3 IMPLANT
KIT BASIN OR (CUSTOM PROCEDURE TRAY) ×3 IMPLANT
NS IRRIG 1000ML POUR BTL (IV SOLUTION) ×3 IMPLANT
PENCIL BUTTON HOLSTER BLD 10FT (ELECTRODE) IMPLANT
SCISSORS LAP 5X35 DISP (ENDOMECHANICALS) ×3 IMPLANT
SET IRRIG TUBING LAPAROSCOPIC (IRRIGATION / IRRIGATOR) ×3 IMPLANT
SLEEVE ADV FIXATION 5X100MM (TROCAR) IMPLANT
SLEEVE Z-THREAD 5X100MM (TROCAR) IMPLANT
SOLUTION ANTI FOG 6CC (MISCELLANEOUS) ×3 IMPLANT
SPONGE GAUZE 4X4 12PLY (GAUZE/BANDAGES/DRESSINGS) ×1 IMPLANT
STAPLER VISISTAT 35W (STAPLE) ×3 IMPLANT
STRIP CLOSURE SKIN 1/2X4 (GAUZE/BANDAGES/DRESSINGS) IMPLANT
SUT PROLENE 0 CT 1 CR/8 (SUTURE) ×1 IMPLANT
SUT SURGIDAC NAB ES-9 0 48 120 (SUTURE) ×17 IMPLANT
SUT VIC AB 4-0 SH 18 (SUTURE) ×3 IMPLANT
SYR 30ML LL (SYRINGE) ×3 IMPLANT
TAPE CLOTH SURG 4X10 WHT LF (GAUZE/BANDAGES/DRESSINGS) ×1 IMPLANT
TIP INNERVISION DETACH 40FR (MISCELLANEOUS) IMPLANT
TIP INNERVISION DETACH 50FR (MISCELLANEOUS) IMPLANT
TIP INNERVISION DETACH 56FR (MISCELLANEOUS) ×1 IMPLANT
TIPS INNERVISION DETACH 40FR (MISCELLANEOUS)
TRAY FOLEY CATH 14FRSI W/METER (CATHETERS) ×3 IMPLANT
TRAY LAP CHOLE (CUSTOM PROCEDURE TRAY) ×3 IMPLANT
TROCAR ADV FIXATION 11X100MM (TROCAR) IMPLANT
TROCAR ADV FIXATION 5X100MM (TROCAR) IMPLANT
TROCAR BLADELESS OPT 5 75 (ENDOMECHANICALS) ×1 IMPLANT
TROCAR HASSON GELL 12X100 (TROCAR) ×1 IMPLANT
TROCAR XCEL BLUNT TIP 100MML (ENDOMECHANICALS) IMPLANT
TROCAR XCEL NON-BLD 11X100MML (ENDOMECHANICALS) ×1 IMPLANT
TROCAR Z-THREAD FIOS 11X100 BL (TROCAR) ×2 IMPLANT
TROCAR Z-THREAD FIOS 5X100MM (TROCAR) ×5 IMPLANT
TROCAR Z-THREAD SLEEVE 11X100 (TROCAR) IMPLANT
TUBING FILTER THERMOFLATOR (ELECTROSURGICAL) ×3 IMPLANT
ventralex st hernia patch ×1 IMPLANT

## 2011-05-16 NOTE — Anesthesia Preprocedure Evaluation (Signed)
Anesthesia Evaluation  Patient identified by MRN, date of birth, ID band Patient awake    Reviewed: Allergy & Precautions, H&P , NPO status , Patient's Chart, lab work & pertinent test results, reviewed documented beta blocker date and time   Airway Mallampati: II TM Distance: >3 FB Neck ROM: Full    Dental  (+) Dental Advisory Given and Teeth Intact   Pulmonary neg pulmonary ROS,          Cardiovascular neg cardio ROS Regular Normal Denies cardiac symptoms   Neuro/Psych Negative Neurological ROS  Negative Psych ROS   GI/Hepatic Neg liver ROS, hiatal hernia,   Endo/Other  Negative Endocrine ROS  Renal/GU negative Renal ROS  Genitourinary negative   Musculoskeletal negative musculoskeletal ROS (+)   Abdominal   Peds negative pediatric ROS (+)  Hematology Anemia Hgb 10.8   Anesthesia Other Findings Upper front caps  Reproductive/Obstetrics negative OB ROS                           Anesthesia Physical Anesthesia Plan  ASA: II  Anesthesia Plan: General   Post-op Pain Management:    Induction: Intravenous and Cricoid pressure planned  Airway Management Planned: Oral ETT  Additional Equipment:   Intra-op Plan:   Post-operative Plan: Extubation in OR  Informed Consent: I have reviewed the patients History and Physical, chart, labs and discussed the procedure including the risks, benefits and alternatives for the proposed anesthesia with the patient or authorized representative who has indicated his/her understanding and acceptance.   Dental advisory given  Plan Discussed with: CRNA and Surgeon  Anesthesia Plan Comments:         Anesthesia Quick Evaluation

## 2011-05-16 NOTE — Anesthesia Procedure Notes (Signed)
Procedure Name: Intubation Date/Time: 05/16/2011 7:47 AM Performed by: Valeda Malm Pre-anesthesia Checklist: Patient identified Patient Re-evaluated:Patient Re-evaluated prior to inductionOxygen Delivery Method: Circle system utilized Preoxygenation: Pre-oxygenation with 100% oxygen Intubation Type: IV induction, Rapid sequence and Cricoid Pressure applied Laryngoscope Size: Mac and 4 Grade View: Grade III Tube type: Oral Tube size: 7.0 mm Number of attempts: 1 Airway Equipment and Method: Stylet Placement Confirmation: ETT inserted through vocal cords under direct vision and positive ETCO2 Tube secured with: Tape Dental Injury: Teeth and Oropharynx as per pre-operative assessment

## 2011-05-16 NOTE — Op Note (Signed)
Surgeon: Wenda Low, MD, FACS  Asst:  Ovidio Kin, MD, FACS  Anes:  general  Procedure: Laparoscopic take down and repair of hiatus hernia (type III with a third of stomach in chest), Nissen fundoplication over a #56 bougie, repair of umbilical hernia with a Bard medium Ventralex patch, excision of sebaceous cyst from between breasts  Diagnosis: Severe GERD, ventral hernia, chronic sebaceous cyst  Complications: None   EBL:   2 cc  Description of Procedure:  The patient was taken to room one and given general anesthesia.  PCMX prep and timeout perfromed.  The abdomen was entered with 0 degree optiview 5 mm through the left upper quadrant.  Standard trocar palcement with 5 mm except one 12 in the lower right position.    Dissection of the hiatus ensued and the stomach was taken down from the chest.  The excess sac was excised.  The posterior crura were closed with a total of 3 zero surgidecs on the Endo Stitch.  The posterior 2 had pledgets.  A 56 bougie was passed and the freed stomach was wrapped around the distal esophagus.  The Nissen was performed with 3 sutures of surgidec.  All sutures were secured with tyknnots.  The nathanson retractor was removed and the umbilical hernia was repaired by excising the sac and installing a medium ventralex patch secured by 0 prolene sutures around the perimenter.  The placement was checked laparoscopically.  Wounds were injected with Exparel and closed with 4-0 vicryl.  The sebaceous cyst was excised and closed with 4-0 vicryl and benzoin and steristrips.   [name], MD, [facility], [place] [phone]

## 2011-05-16 NOTE — Transfer of Care (Signed)
Immediate Anesthesia Transfer of Care Note  Patient: Everardo All  Procedure(s) Performed: Procedure(s) (LRB): LAPAROSCOPIC NISSEN FUNDOPLICATION (N/A) IRRIGATION AND DEBRIDEMENT SEBACEOUS CYST (N/A) HERNIA REPAIR UMBILICAL ADULT (N/A)  Patient Location: PACU  Anesthesia Type: General  Level of Consciousness: sedated  Airway & Oxygen Therapy: Patient Spontanous Breathing and Patient connected to face mask oxygen  Post-op Assessment: Report given to PACU RN and Post -op Vital signs reviewed and stable  Post vital signs: Reviewed and stable  Complications: No apparent anesthesia complications

## 2011-05-16 NOTE — Anesthesia Postprocedure Evaluation (Signed)
  Anesthesia Post-op Note  Patient: Stephanie Bell  Procedure(s) Performed: Procedure(s) (LRB): LAPAROSCOPIC NISSEN FUNDOPLICATION (N/A) IRRIGATION AND DEBRIDEMENT SEBACEOUS CYST (N/A) HERNIA REPAIR UMBILICAL ADULT (N/A)  Patient Location: PACU  Anesthesia Type: General  Level of Consciousness: oriented and sedated  Airway and Oxygen Therapy: Patient Spontanous Breathing and Patient connected to nasal cannula oxygen  Post-op Pain: mild  Post-op Assessment: Post-op Vital signs reviewed, Patient's Cardiovascular Status Stable, Respiratory Function Stable and Patent Airway  Post-op Vital Signs: stable  Complications: No apparent anesthesia complications

## 2011-05-17 ENCOUNTER — Inpatient Hospital Stay (HOSPITAL_COMMUNITY): Payer: Medicare Other

## 2011-05-17 LAB — COMPREHENSIVE METABOLIC PANEL
Alkaline Phosphatase: 74 U/L (ref 39–117)
BUN: 7 mg/dL (ref 6–23)
CO2: 28 mEq/L (ref 19–32)
Chloride: 101 mEq/L (ref 96–112)
GFR calc Af Amer: >90 mL/min (ref >90)
GFR calc non Af Amer: 86 mL/min — ABNORMAL LOW (ref >90)
Glucose, Bld: 152 mg/dL — ABNORMAL HIGH (ref 70–99)
Potassium: 4.2 mEq/L (ref 3.5–5.1)
Total Bilirubin: 0.5 mg/dL (ref 0.3–1.2)

## 2011-05-17 LAB — CBC
MCV: 73.9 fL — ABNORMAL LOW (ref 78.0–100.0)
Platelets: 143 10*3/uL — ABNORMAL LOW (ref 150–400)
RDW: 15.7 % — ABNORMAL HIGH (ref 11.5–15.5)
WBC: 11.2 10*3/uL — ABNORMAL HIGH (ref 4.0–10.5)

## 2011-05-17 MED ORDER — IOHEXOL 300 MG/ML  SOLN: 65.0000 mL | Freq: Once | INTRAMUSCULAR | Status: AC | PRN

## 2011-05-17 NOTE — Progress Notes (Signed)
Patient ID: Stephanie Bell, female   DOB: 1940-10-26, 71 y.o.   MRN: [id number] [facility] Progress Note:   1 Day Post-Op  Subjective: Mental status is alert and cheerful. She had some pain issues last night which were treated with titrating her IV pain meds. Is currently in no pain Objective: Vital signs in last 24 hours: Temp:  [96.9 F (36.1 C)-98.3 F (36.8 C)] 97.5 F (36.4 C) (02/27 0553) Pulse Rate:  [66-80] 79  (02/27 0553) Resp:  [7-20] 17  (02/27 0553) BP: (124-165)/(60-88) 124/66 mmHg (02/27 0553) SpO2:  [94 %-100 %] 97 % (02/27 0553) Weight:  [197 lb (89.359 kg)] 197 lb (89.359 kg) (02/26 1650)  Intake/Output from previous day: 02/26 0701 - 02/27 0700 In: 3000 [I.V.:3000] Out: 1230 [Urine:1200; Blood:30] Intake/Output this shift:    Physical Exam: Work of breathing is normal.  Pain is appropriate. Gastrografin swallow is pending. Pending on the outcome we will probably advance her to water only today.  Lab Results:  Results for orders placed during the hospital encounter of 05/16/11 (from the past 48 hour(s))  CBC     Status: Abnormal   Collection Time   05/16/11  5:05 PM      Component Value Range Comment   WBC 11.1 (*) 4.0 - 10.5 (K/uL)    RBC 4.51  3.87 - 5.11 (MIL/uL)    Hemoglobin 10.5 (*) 12.0 - 15.0 (g/dL)    HCT 41.3 (*) 24.4 - 46.0 (%)    MCV 73.8 (*) 78.0 - 100.0 (fL)    MCH 23.3 (*) 26.0 - 34.0 (pg)    MCHC 31.5  30.0 - 36.0 (g/dL)    RDW 01.0  27.2 - 53.6 (%)    Platelets 126 (*) 150 - 400 (K/uL)   CREATININE, SERUM     Status: Abnormal   Collection Time   05/16/11  5:05 PM      Component Value Range Comment   Creatinine, Ser 0.73  0.50 - 1.10 (mg/dL)    GFR calc non Af Amer 84 (*) >90 (mL/min)    GFR calc Af Amer >90  >90 (mL/min)   COMPREHENSIVE METABOLIC PANEL     Status: Abnormal   Collection Time   05/17/11  4:28 AM      Component Value Range Comment   Sodium 134 (*) 135 - 145 (mEq/L)    Potassium 4.2  3.5 - 5.1 (mEq/L)    Chloride 101  96 - 112 (mEq/L)    CO2 28  19 - 32 (mEq/L)    Glucose, Bld 152 (*) 70 - 99 (mg/dL)    BUN 7  6 - 23 (mg/dL)    Creatinine, Ser 6.44  0.50 - 1.10 (mg/dL)    Calcium 9.1  8.4 - 10.5 (mg/dL)    Total Protein 6.2  6.0 - 8.3 (g/dL)    Albumin 3.3 (*) 3.5 - 5.2 (g/dL)    AST 56 (*) 0 - 37 (U/L)    ALT 50 (*) 0 - 35 (U/L)    Alkaline Phosphatase 74  39 - 117 (U/L)    Total Bilirubin 0.5  0.3 - 1.2 (mg/dL)    GFR calc non Af Amer 86 (*) >90 (mL/min)    GFR calc Af Amer >90  >90 (mL/min)   CBC     Status: Abnormal   Collection Time   05/17/11  4:28 AM      Component Value Range Comment   WBC 11.2 (*) 4.0 - 10.5 (K/uL)  RBC 4.22  3.87 - 5.11 (MIL/uL)    Hemoglobin 9.8 (*) 12.0 - 15.0 (g/dL)    HCT 16.1 (*) 09.6 - 46.0 (%)    MCV 73.9 (*) 78.0 - 100.0 (fL)    MCH 23.2 (*) 26.0 - 34.0 (pg)    MCHC 31.4  30.0 - 36.0 (g/dL)    RDW 04.5 (*) 40.9 - 15.5 (%)    Platelets 143 (*) 150 - 400 (K/uL)     Radiology/Results: No results found.  Anti-infectives: Anti-infectives     Start     Dose/Rate Route Frequency Ordered Stop   05/16/11 0600   cefOXitin (MEFOXIN) 2 g in dextrose 5 % 50 mL IVPB        2 g 100 mL/hr over 30 Minutes Intravenous 60 min pre-op 05/16/11 0557 05/16/11 0745          Assessment/Plan: Problem List: Patient Active Problem List  Diagnoses  . Paraesophageal hernia  . Infected sebaceous cyst  . Hiatal hernia    Overall she looks good.  Hg  noted to be 9.8  await results of upper GI series. 1 Day Post-Op    LOS: 1 day   [name], MD, [facility] [phone] beeper [phone]  05/17/2011 8:21 AM

## 2011-05-18 MED ORDER — HYDROCODONE-ACETAMINOPHEN 7.5-500 MG/15ML PO SOLN: 10.0000 mL | ORAL | Status: AC | PRN

## 2011-05-18 MED ORDER — PROMETHAZINE HCL 25 MG RE SUPP: 25.0000 mg | Freq: Four times a day (QID) | RECTAL | Status: DC | PRN

## 2011-05-18 MED ORDER — HYDROCODONE-ACETAMINOPHEN 7.5-500 MG/15ML PO SOLN: 10.0000 mL | ORAL | Status: DC | PRN

## 2011-05-18 NOTE — Discharge Instructions (Signed)
Hiatal Hernia A hiatal hernia occurs when a part of the stomach slides above the diaphragm. The diaphragm is the thin muscle separating the belly (abdomen) from the chest. A hiatal hernia can be something you are born with or develop over time. Hiatal hernias may allow stomach acid to flow back into your esophagus, the tube which carries food from your mouth to your stomach. If this acid causes problems it is called GERD (gastro-esophageal reflux disease).  SYMPTOMS  Common symptoms of GERD are heartburn (burning in your chest). This is worse when lying down or bending over. It may also cause belching and indigestion. Some of the things which make GERD worse are:  Increased weight pushes on stomach making acid rise more easily.   Smoking markedly increases acid production.   Alcohol decreases lower esophageal sphincter pressure (valve between stomach and esophagus), allowing acid from stomach into esophagus.   Late evening meals and going to bed with a full stomach increases pressure.   Anything that causes an increase in acid production.   Lower esophageal sphincter incompetence.  DIAGNOSIS  Hiatal hernia is often diagnosed with x-rays of your stomach and small bowel. This is called an UGI (upper gastrointestinal x-ray). Sometimes a gastroscopic procedure is done. This is a procedure where your caregiver uses a flexible instrument to look into the stomach and small bowel. HOME CARE INSTRUCTIONS   Try to achieve and maintain an ideal body weight.   Avoid drinking alcoholic beverages.   Stop smoking.   Put the head of your bed on 4 to 6 inch blocks. This will keep your head and esophagus higher than your stomach. If you cannot use blocks, sleep with several pillows under your head and shoulders.   Over-the-counter medications will decrease acid production. Your caregiver can also prescribe medications for this. Take as directed.   1/2 to 1 teaspoon of an antacid taken every hour while  awake, with meals and at bedtime, will neutralize acid.   Do not take aspirin, ibuprofen (Advil or Motrin), or other nonsteroidal anti-inflammatory drugs.   Do not wear tight clothing around your chest or stomach.   Eat smaller meals and eat more frequently. This keeps your stomach from getting too full. Eat slowly.   Do not lie down for 2 or 3 hours after eating. Do not eat or drink anything 1 to 2 hours before going to bed.   Avoid caffeine beverages (colas, coffee, cocoa, tea), fatty foods, citrus fruits and all other foods and drinks that contain acid and that seem to increase the problems.   Avoid bending over, especially after eating. Also avoid straining during bowel movements or when urinating or lifting things. Anything that increases the pressure in your belly increases the amount of acid that may be pushed up into your esophagus.  SEEK IMMEDIATE MEDICAL CARE IF:  There is change in location (pain in arms, neck, jaw, teeth or back) of your pain, or the pain is getting worse.   You also experience nausea, vomiting, sweating (diaphoresis), or shortness of breath.   You develop continual vomiting, vomit blood or coffee ground material, have bright red blood in your stools, or have black tarry stools.  Some of these symptoms could signal other problems such as heart disease. MAKE SURE YOU:   Understand these instructions.   Monitor your condition.   Contact your caregiver if you are not doing well or are getting worse.  Document Released: 05/27/2003 Document Revised: 11/16/2010 Document Reviewed: 03/06/2005 [employer] Patient   Information 2012 [employer]. 

## 2011-05-18 NOTE — Progress Notes (Signed)
Incision care instructions given.  Instructed pt on showering.  Steri strip and derma bond care.

## 2011-05-18 NOTE — Discharge Summary (Signed)
Physician Discharge Summary  Patient ID: Stephanie Bell MRN: [id number] DOB/AGE: [birth year] 72 y.o.  Admit date: 05/16/2011 Discharge date: 05/18/2011  Admission Diagnoses:  GERD  Discharge Diagnoses:  same  Active Problems:  * No active hospital problems. *    Surgery:  Lap repair of large type III mixed hiatus hernia with Nissen over a 56 dilator  Discharged Condition: improved   Hospital Course:   Had Nissen.  PD 1 swallow looked good.  Ready for discharge on PD 2  Consults: none  Significant Diagnostic Studies: UGI. normal    Discharge Exam: Blood pressure 161/94, pulse 72, temperature 97.9 F (36.6 C), temperature source Oral, resp. rate 17, height 5\' 5"  (1.651 m), weight 197 lb (89.359 kg), SpO2 94.00%. Incisions bland  Disposition:   Discharge Orders    Future Appointments: Provider: Department: Dept Phone: Center:   06/02/2011 2:00 PM Valarie Merino, MD [facility] [phone] None     Future Orders Please Complete By Expires   Diet - low sodium heart healthy      Comments:   See post Nissen orders above.  Clears x 1 week then pureed x 4 weeks   Increase activity slowly      Discharge instructions      Scheduling Instructions:   Use phenergan if significant nausea.  Try to avoid vomitin   Comments:   Clear liquids for one week followed by pureed diet for 4 weeks   Driving Restrictions      Comments:   Drive when not taking pain meds   No wound care        Medication List  As of 05/18/2011  8:32 AM   STOP taking these medications         doxycycline 100 MG DR capsule      omeprazole 20 MG tablet         TAKE these medications         cholecalciferol 1000 UNITS tablet   Commonly known as: VITAMIN D   Take 2,000 Units by mouth daily.      HYDROcodone-acetaminophen 7.5-500 MG/15ML solution   Commonly known as: LORTAB   Take 10 mLs by mouth every 4 (four) hours as needed.      ibuprofen 200 MG tablet   Commonly known as: ADVIL,MOTRIN   Take 200-400 mg by mouth every 6 (six) hours as needed. Pain      methylphenidate 10 MG tablet   Commonly known as: RITALIN   Take 10 mg by mouth 2 (two) times daily as needed. When driving on long trips        nitrofurantoin 100 MG capsule   Commonly known as: MACRODANTIN   Take 100 mg by mouth daily.      promethazine 25 MG suppository   Commonly known as: PHENERGAN   Place 1 suppository (25 mg total) rectally every 6 (six) hours as needed.           Follow-up Information    Call Valarie Merino, MD.   Contact information:   [facility] [address] [place] [address] [phone]          Signed: Valarie Merino 05/18/2011, 8:32 AM

## 2011-05-19 ENCOUNTER — Encounter (HOSPITAL_COMMUNITY): Payer: Self-pay | Admitting: Surgery

## 2011-06-02 ENCOUNTER — Encounter (INDEPENDENT_AMBULATORY_CARE_PROVIDER_SITE_OTHER): Payer: Medicare Other | Admitting: Surgery

## 2011-06-05 ENCOUNTER — Emergency Department: Payer: Self-pay | Admitting: Emergency Medicine

## 2011-06-05 LAB — URINALYSIS, COMPLETE
Bacteria: NONE SEEN
Bilirubin,UR: NEGATIVE
Glucose,UR: NEGATIVE mg/dL (ref 0–75)
Ph: 8 (ref 4.5–8.0)
Protein: NEGATIVE
RBC,UR: <1 /HPF (ref 0–5)
Squamous Epithelial: NONE SEEN

## 2011-06-05 LAB — CBC WITH DIFFERENTIAL/PLATELET
Basophil #: 0 10*3/uL (ref 0.0–0.1)
Eosinophil #: 0.2 10*3/uL (ref 0.0–0.7)
Eosinophil %: 5.7 %
HCT: 33.1 % — ABNORMAL LOW (ref 35.0–47.0)
Lymphocyte #: 1.2 10*3/uL (ref 1.0–3.6)
MCH: 24.5 pg — ABNORMAL LOW (ref 26.0–34.0)
MCHC: 33.1 g/dL (ref 32.0–36.0)
MCV: 74 fL — ABNORMAL LOW (ref 80–100)
Monocyte %: 10.1 %
Platelet: 150 10*3/uL (ref 150–440)
RBC: 4.46 10*6/uL (ref 3.80–5.20)
RDW: 17.8 % — ABNORMAL HIGH (ref 11.5–14.5)

## 2011-06-05 LAB — TROPONIN I: Troponin-I: <0.02 ng/mL

## 2011-06-05 LAB — CK TOTAL AND CKMB (NOT AT ARMC)
CK, Total: 69 U/L (ref 21–215)
CK-MB: <0.5 ng/mL — ABNORMAL LOW (ref 0.5–3.6)

## 2011-06-05 LAB — COMPREHENSIVE METABOLIC PANEL
Albumin: 3.6 g/dL (ref 3.4–5.0)
Alkaline Phosphatase: 66 U/L (ref 50–136)
Anion Gap: 13 (ref 7–16)
BUN: 8 mg/dL (ref 7–18)
Calcium, Total: 9 mg/dL (ref 8.5–10.1)
Creatinine: 0.63 mg/dL (ref 0.60–1.30)
Glucose: 107 mg/dL — ABNORMAL HIGH (ref 65–99)
SGOT(AST): 30 U/L (ref 15–37)

## 2011-06-08 ENCOUNTER — Encounter (HOSPITAL_COMMUNITY): Payer: Self-pay

## 2011-06-08 ENCOUNTER — Emergency Department (HOSPITAL_COMMUNITY): Payer: Medicare Other

## 2011-06-08 ENCOUNTER — Other Ambulatory Visit: Payer: Self-pay

## 2011-06-08 ENCOUNTER — Ambulatory Visit (INDEPENDENT_AMBULATORY_CARE_PROVIDER_SITE_OTHER): Payer: Medicare Other | Admitting: Surgery

## 2011-06-08 ENCOUNTER — Emergency Department (HOSPITAL_COMMUNITY)
Admission: EM | Admit: 2011-06-08 | Discharge: 2011-06-08 | Disposition: A | Discharge status: Home / Self Care | Payer: Medicare Other | Attending: Emergency Medicine | Admitting: Emergency Medicine

## 2011-06-08 DIAGNOSIS — N3010 Interstitial cystitis (chronic) without hematuria: Secondary | ICD-10-CM | POA: Insufficient documentation

## 2011-06-08 DIAGNOSIS — R0600 Dyspnea, unspecified: Secondary | ICD-10-CM

## 2011-06-08 DIAGNOSIS — R0989 Other specified symptoms and signs involving the circulatory and respiratory systems: Secondary | ICD-10-CM | POA: Insufficient documentation

## 2011-06-08 DIAGNOSIS — Z9889 Other specified postprocedural states: Secondary | ICD-10-CM | POA: Insufficient documentation

## 2011-06-08 DIAGNOSIS — R0602 Shortness of breath: Secondary | ICD-10-CM | POA: Insufficient documentation

## 2011-06-08 DIAGNOSIS — R0609 Other forms of dyspnea: Secondary | ICD-10-CM | POA: Insufficient documentation

## 2011-06-08 DIAGNOSIS — Z09 Encounter for follow-up examination after completed treatment for conditions other than malignant neoplasm: Secondary | ICD-10-CM

## 2011-06-08 NOTE — Progress Notes (Signed)
I saw Stephanie Bell in the [facility] emergency room this morning. She had had a bout of procedure shortness of breath and what sounds like a panic attack in that she said her hands and feet when she felt like she was pass out. She was taken earlier this week by ambulance to [facility] when she felt like she couldn't the. I reviewed his CT scan report which showed no evidence of a pulmonary embolus, some bibasilar changes possibly atelectasis or pneumonia, and usual anticipated postsurgical changes from her recent Nissen fundoplication and hiatal hernia repair 3 weeks ago. In addition I were removed a sebaceous cyst of her chest 6 umbilical hernia.  Today she is feeling better. Her work of breathing looked normal. I told her not to worry about her morning office, with me and I would see her back in about 3 weeks. I discussed this with Dr. Patria Mane who was seeing her in the emergency department.  Impression three-week status post left Nissen and hiatal hernia repair, removal of sebaceous cyst of anterior chest, umbilical hernia repair. Plan return to the office in 3 weeks

## 2011-06-08 NOTE — ED Notes (Signed)
Pt c/o SOB, states dx with PNA on Monday, pt very anxious speaking in complete sentences no distress noted.

## 2011-06-08 NOTE — ED Provider Notes (Signed)
History     CSN: [id number]  Arrival date & time 06/08/11  4098   First MD Initiated Contact with Patient 06/08/11 0703      Chief Complaint  Patient presents with  . Shortness of Breath     HPI The patient reports transient shortness of breath 5 nights ago.  She was seen in outside ER was evaluated with chest x-ray labs EKG and CT scan all of which demonstrated possible atelectasis versus early bibasilar infiltrates.  She was started on antibiotics.  She reportedly did well over the past 3-4 days until last night when she would lay down at night and she became short of breath.  She has no dyspnea on exertion.  She reports she can lay down flat at this time without any difficulty in her breathing.  She denies orthopnea.  She denies chest pain fevers chills nausea vomiting or diaphoresis.  She has no prior cardiac history.  She denies recent illness.  The patient had a Nissen as well as umbilical hernia repair and debridement of a sebaceous cyst completed on 05/16/2011 by Dr. Daphine Deutscher.  She denies unilateral leg swelling.  She's had a prior DVT or pulmonary embolism.  She reports she is tolerating the antibiotics.   Past Medical History  Diagnosis Date  . Interstitial cystitis   . Hx: UTI (urinary tract infection)   . Paraesophageal hernia   . Weight loss   . Wound disruption, post-op, skin   . Pneumonia 05-09-11    2011 last time    Past Surgical History  Procedure Date  . Vaginal hysterectomy   . Septoplasty   . Back surgery 05-09-11    x 2 lumbar  . Cholecystectomy 05-09-11    laparoscopic  . Tonsillectomy 05-09-11    Tonsillectomy  . Kidney stone surgery 05-09-11    past hx. and cystoscopy for bladder issues  . Laparoscopic nissen fundoplication 05/16/2011    Procedure: LAPAROSCOPIC NISSEN FUNDOPLICATION;  Surgeon: Valarie Merino, MD;  Location: [facility];  Service: General;  Laterality: N/A;  will also remove sebaceous cyst of chest wall  . Irrigation and debridement sebaceous  cyst 05/16/2011    Procedure: IRRIGATION AND DEBRIDEMENT SEBACEOUS CYST;  Surgeon: Valarie Merino, MD;  Location: [facility];  Service: General;  Laterality: N/A;  middle of chest on sternum  . Umbilical hernia repair 05/16/2011    Procedure: HERNIA REPAIR UMBILICAL ADULT;  Surgeon: Valarie Merino, MD;  Location: [facility];  Service: General;  Laterality: N/A;  with mesh     Family History  Problem Relation Age of Onset  . Colon cancer Neg Hx   . Ulcers Paternal Grandmother   . Ulcers Father   . Alcohol abuse Father     History  Substance Use Topics  . Smoking status: Never Smoker   . Smokeless tobacco: Never Used  . Alcohol Use: Yes     4 ounces of wine daily    OB History    Grav Para Term Preterm Abortions TAB SAB Ect Mult Living                  Review of Systems  All other systems reviewed and are negative.    Allergies  Sulfa antibiotics  Home Medications   Current Outpatient Rx  Name Route Sig Dispense Refill  . VITAMIN D 1000 UNITS PO TABS Oral Take 2,000 Units by mouth daily.      . IBUPROFEN 200 MG PO TABS Oral Take 200-400  mg by mouth every 6 (six) hours as needed. Pain    . LEVOFLOXACIN 500 MG PO TABS Oral Take 500 mg by mouth daily.    . METHYLPHENIDATE HCL 10 MG PO TABS Oral Take 10 mg by mouth 2 (two) times daily as needed. When driving on long trips       BP 121/81  Pulse 87  Temp(Src) 97.8 F (36.6 C) (Oral)  Resp 20  SpO2 100%  Physical Exam  Nursing note and vitals reviewed. Constitutional: She is oriented to person, place, and time. She appears well-developed and well-nourished. No distress.  HENT:  Head: Normocephalic and atraumatic.  Eyes: EOM are normal.  Neck: Normal range of motion.  Cardiovascular: Normal rate, regular rhythm and normal heart sounds.   Pulmonary/Chest: Effort normal and breath sounds normal.  Abdominal: Soft. She exhibits no distension. There is no tenderness.  Musculoskeletal: Normal range of motion.    Neurological: She is alert and oriented to person, place, and time.  Skin: Skin is warm and dry.  Psychiatric: She has a normal mood and affect. Judgment normal.    ED Course  Procedures (including critical care time)   Date: 06/08/2011  Rate: 69  Rhythm: normal sinus rhythm  QRS Axis: normal  Intervals: normal  ST/T Wave abnormalities: normal  Conduction Disutrbances: none  Narrative Interpretation:   Old EKG Reviewed: No significant changes noted    Labs Reviewed - No data to display Dg Chest 2 View  06/08/2011  *RADIOLOGY REPORT*  Clinical Data: Short of breath  CHEST - 2 VIEW  Comparison: None.  Findings: Bronchitic changes and hyperaeration.  No consolidation. No pneumothorax or pleural effusion.  Upper normal heart size.  IMPRESSION: Bronchitic changes and hyperaeration.  Original Report Authenticated By: Donavan Burnet, M.D.     1. Dyspnea       MDM  Patient was seen several days ago and had a CT scan of her chest demonstrate no evidence of a pulmonary embolism and possible atelectasis in the bases.  His shortness breath is transient.  She has no shortness of breath at this time.  EKG and chest x-ray are normal.  I had attempted to obtain laboratory studies however her surgeon came and saw the patient and told the patient that she is having a panic attack and that she did not need a chest x-ray lab work.  The patient had to be convinced to have a chest x-ray.  She does not want any lab work completed.  My suspicion that this is ACS is very low.  Will DC home with close PCP in surgery followup.        Lyanne Co, MD 06/08/11 [phone]

## 2011-06-15 ENCOUNTER — Telehealth (INDEPENDENT_AMBULATORY_CARE_PROVIDER_SITE_OTHER): Payer: Self-pay | Admitting: General Surgery

## 2011-06-15 NOTE — Telephone Encounter (Signed)
Pt calling in with generalized questions about how to slowly progress her diet.  She also shared with me her 2 recent trips to the ER with panic attacks.  She seemed more in need for reassurance that her healing is on-track, which I tried to give her, than anything else.  She is waiting to hear from [facility] about her next appt with Dr. Daphine Deutscher.

## 2011-06-19 ENCOUNTER — Telehealth (INDEPENDENT_AMBULATORY_CARE_PROVIDER_SITE_OTHER): Payer: Self-pay | Admitting: General Surgery

## 2011-06-19 NOTE — Telephone Encounter (Signed)
Pt calling in with reports of severe gas pains.  She is post hiatal hernia repair.  Advised to avoid any foods that produce gas and all carbonated beverages.  Okay to try Gas-X, Tums or Rolaids.

## 2011-06-30 ENCOUNTER — Ambulatory Visit (INDEPENDENT_AMBULATORY_CARE_PROVIDER_SITE_OTHER): Payer: Medicare Other | Admitting: Surgery

## 2011-06-30 ENCOUNTER — Encounter (INDEPENDENT_AMBULATORY_CARE_PROVIDER_SITE_OTHER): Payer: Self-pay | Admitting: Surgery

## 2011-06-30 VITALS — BP 126/86 | HR 75 | Temp 96.8°F | Ht 66.0 in | Wt 174.6 lb

## 2011-06-30 DIAGNOSIS — Z09 Encounter for follow-up examination after completed treatment for conditions other than malignant neoplasm: Secondary | ICD-10-CM

## 2011-06-30 DIAGNOSIS — Z9889 Other specified postprocedural states: Secondary | ICD-10-CM

## 2011-06-30 NOTE — Progress Notes (Signed)
Stephanie Bell 71 y.o.  Body mass index is 28.18 kg/(m^2).  Patient Active Problem List  Diagnoses  . Paraesophageal hernia  . Infected sebaceous cyst  . Hiatal hernia  . S/P Nissen fundoplication (without gastrostomy tube) procedure    Allergies  Allergen Reactions  . Sulfa Antibiotics Rash    Past Surgical History  Procedure Date  . Vaginal hysterectomy   . Septoplasty   . Back surgery 05-09-11    x 2 lumbar  . Cholecystectomy 05-09-11    laparoscopic  . Tonsillectomy 05-09-11    Tonsillectomy  . Kidney stone surgery 05-09-11    past hx. and cystoscopy for bladder issues  . Laparoscopic nissen fundoplication 05/16/2011    Procedure: LAPAROSCOPIC NISSEN FUNDOPLICATION;  Surgeon: Valarie Merino, MD;  Location: [facility];  Service: General;  Laterality: N/A;  will also remove sebaceous cyst of chest wall  . Irrigation and debridement sebaceous cyst 05/16/2011    Procedure: IRRIGATION AND DEBRIDEMENT SEBACEOUS CYST;  Surgeon: Valarie Merino, MD;  Location: [facility];  Service: General;  Laterality: N/A;  middle of chest on sternum  . Umbilical hernia repair 05/16/2011    Procedure: HERNIA REPAIR UMBILICAL ADULT;  Surgeon: Valarie Merino, MD;  Location: [facility];  Service: General;  Laterality: N/A;  with mesh   . Hernia repair 05/16/2011    hiatal hernia   HEDRICK,JAMES, MD, MD No diagnosis found.  Doing very well.  Was having panic attacks postop.  Incisions and hernia repair intact.  Eating well at 6 weeks postop.  REturn 2 months [name], MD, [facility] [phone] beeper [phone]  06/30/2011 2:14 PM

## 2011-07-31 ENCOUNTER — Encounter: Payer: Self-pay | Admitting: Internal Medicine

## 2011-08-15 ENCOUNTER — Encounter: Payer: Self-pay | Admitting: Internal Medicine

## 2011-08-15 ENCOUNTER — Ambulatory Visit (AMBULATORY_SURGERY_CENTER): Payer: Medicare Other

## 2011-08-15 VITALS — Ht 66.0 in | Wt 172.8 lb

## 2011-08-15 DIAGNOSIS — Z1211 Encounter for screening for malignant neoplasm of colon: Secondary | ICD-10-CM

## 2011-08-15 DIAGNOSIS — Z86010 Personal history of colonic polyps: Secondary | ICD-10-CM

## 2011-08-15 MED ORDER — PEG-KCL-NACL-NASULF-NA ASC-C 100 G PO SOLR: 1.0000 | Freq: Once | ORAL | Status: AC

## 2011-08-18 ENCOUNTER — Ambulatory Visit (INDEPENDENT_AMBULATORY_CARE_PROVIDER_SITE_OTHER): Payer: Medicare Other | Admitting: Surgery

## 2011-08-18 ENCOUNTER — Encounter (INDEPENDENT_AMBULATORY_CARE_PROVIDER_SITE_OTHER): Payer: Self-pay | Admitting: Surgery

## 2011-08-18 VITALS — BP 136/86 | HR 72 | Temp 97.6°F | Resp 18 | Ht 66.0 in | Wt 174.0 lb

## 2011-08-18 DIAGNOSIS — Z09 Encounter for follow-up examination after completed treatment for conditions other than malignant neoplasm: Secondary | ICD-10-CM

## 2011-08-18 DIAGNOSIS — Z9889 Other specified postprocedural states: Secondary | ICD-10-CM

## 2011-08-18 NOTE — Progress Notes (Signed)
Patient is 13 weeks from surgery: Procedure: Laparoscopic take down and repair of hiatus hernia (type III with a third of stomach in chest), Nissen fundoplication over a #56 bougie, repair of umbilical hernia with a Bard medium Ventralex patch, excision of sebaceous cyst from between breasts    She reports some indigestion with certain foods and a lot of gas and bloating. She is taken some Zantac for that and urged her to try simethicone or TUMS and see if that helps his symptoms. She lost about 30 pounds looks good his back playing tennis. Her umbilical hernia and enters sebaceous cyst excision have all done nicely as well. I'll see her again in 6 weeks and see she's doing.

## 2011-08-18 NOTE — Patient Instructions (Signed)
Try Tums or simethicone for indigestion and note how it makes you feel

## 2011-08-29 ENCOUNTER — Encounter: Payer: Medicare Other | Admitting: Internal Medicine

## 2011-08-30 ENCOUNTER — Encounter: Payer: Medicare Other | Admitting: Internal Medicine

## 2011-09-01 ENCOUNTER — Telehealth: Payer: Self-pay | Admitting: Internal Medicine

## 2011-09-01 ENCOUNTER — Encounter: Payer: Medicare Other | Admitting: Internal Medicine

## 2011-09-01 ENCOUNTER — Encounter (INDEPENDENT_AMBULATORY_CARE_PROVIDER_SITE_OTHER): Payer: Medicare Other | Admitting: Surgery

## 2011-09-01 ENCOUNTER — Encounter: Payer: Self-pay | Admitting: Internal Medicine

## 2011-09-01 NOTE — Telephone Encounter (Signed)
No charge. 

## 2011-09-29 ENCOUNTER — Encounter: Payer: Self-pay | Admitting: Internal Medicine

## 2011-10-09 ENCOUNTER — Emergency Department (HOSPITAL_COMMUNITY)
Admission: EM | Admit: 2011-10-09 | Discharge: 2011-10-09 | Disposition: A | Discharge status: Home / Self Care | Payer: Medicare Other | Attending: Emergency Medicine | Admitting: Emergency Medicine

## 2011-10-09 ENCOUNTER — Emergency Department (HOSPITAL_COMMUNITY): Payer: Medicare Other

## 2011-10-09 ENCOUNTER — Encounter (HOSPITAL_COMMUNITY): Payer: Self-pay | Admitting: *Deleted

## 2011-10-09 DIAGNOSIS — R209 Unspecified disturbances of skin sensation: Secondary | ICD-10-CM | POA: Insufficient documentation

## 2011-10-09 DIAGNOSIS — R42 Dizziness and giddiness: Secondary | ICD-10-CM | POA: Insufficient documentation

## 2011-10-09 DIAGNOSIS — Z79899 Other long term (current) drug therapy: Secondary | ICD-10-CM | POA: Insufficient documentation

## 2011-10-09 DIAGNOSIS — R200 Anesthesia of skin: Secondary | ICD-10-CM

## 2011-10-09 DIAGNOSIS — N3010 Interstitial cystitis (chronic) without hematuria: Secondary | ICD-10-CM | POA: Insufficient documentation

## 2011-10-09 LAB — COMPREHENSIVE METABOLIC PANEL
ALT: 11 U/L (ref 0–35)
AST: 18 U/L (ref 0–37)
CO2: 27 mEq/L (ref 19–32)
Calcium: 9.3 mg/dL (ref 8.4–10.5)
Creatinine, Ser: 0.76 mg/dL (ref 0.50–1.10)
GFR calc Af Amer: >90 mL/min (ref >90)
GFR calc non Af Amer: 82 mL/min — ABNORMAL LOW (ref >90)
Glucose, Bld: 103 mg/dL — ABNORMAL HIGH (ref 70–99)
Sodium: 135 mEq/L (ref 135–145)
Total Protein: 7 g/dL (ref 6.0–8.3)

## 2011-10-09 LAB — CBC WITH DIFFERENTIAL/PLATELET
Basophils Absolute: 0 10*3/uL (ref 0.0–0.1)
Eosinophils Absolute: 0.2 10*3/uL (ref 0.0–0.7)
Eosinophils Relative: 3 % (ref 0–5)
HCT: 35.1 % — ABNORMAL LOW (ref 36.0–46.0)
Lymphocytes Relative: 19 % (ref 12–46)
MCH: 25 pg — ABNORMAL LOW (ref 26.0–34.0)
MCV: 76.3 fL — ABNORMAL LOW (ref 78.0–100.0)
Monocytes Absolute: 0.5 10*3/uL (ref 0.1–1.0)
Platelets: 160 10*3/uL (ref 150–400)
RDW: 14.2 % (ref 11.5–15.5)

## 2011-10-09 LAB — URINALYSIS, ROUTINE W REFLEX MICROSCOPIC
Bilirubin Urine: NEGATIVE
Glucose, UA: NEGATIVE mg/dL
Hgb urine dipstick: NEGATIVE
Ketones, ur: NEGATIVE mg/dL
Protein, ur: NEGATIVE mg/dL
Urobilinogen, UA: 1 mg/dL (ref 0.0–1.0)

## 2011-10-09 NOTE — ED Notes (Signed)
Dr. Jeraldine Loots at bedside. Pt states she feels a lot better like she is back to her normal self.

## 2011-10-09 NOTE — ED Provider Notes (Signed)
History     CSN: [id number]  Arrival date & time 10/09/11  2051   First MD Initiated Contact with Patient 10/09/11 2107      Chief Complaint  Patient presents with  . Numbness  . Dizziness    (Consider location/radiation/quality/duration/timing/severity/associated sxs/prior treatment) HPI The patient presents with concerns of right sided dysesthesia.  She notes over the past week she has had a URI like condition.  She has been taking Delsym as well as tussinex.  She notes that in the past 2 days her symptoms have generally been improving.  Today, approximately 3.5 hours prior to presentation the patient developed right sided dysesthesia, first in her distal right lateral foot, then ascending to the hip.  The sensation and was present in her shoulder and right face.  There is no sensation in her thorax, or abdomen. Symptoms lasted approximately 2 hours, on arrival the patient has no complaints.  She denies any ongoing, or prior weakness, pain, loss of proprioception, confusion, disorientation, fevers, chills or any other focal complaints. Notably, the patient was seen at urgent care, transferred here.  She has already taken two baby aspirin. Past Medical History  Diagnosis Date  . Interstitial cystitis   . Hx: UTI (urinary tract infection)   . Paraesophageal hernia   . Weight loss   . Wound disruption, post-op, skin   . Pneumonia 05-09-11    2011 last time    Past Surgical History  Procedure Date  . Vaginal hysterectomy   . Septoplasty   . Back surgery 05-09-11    x 2 lumbar  . Cholecystectomy 05-09-11    laparoscopic  . Tonsillectomy 05-09-11    Tonsillectomy  . Kidney stone surgery 05-09-11    past hx. and cystoscopy for bladder issues  . Laparoscopic nissen fundoplication 05/16/2011    Procedure: LAPAROSCOPIC NISSEN FUNDOPLICATION;  Surgeon: Valarie Merino, MD;  Location: [facility];  Service: General;  Laterality: N/A;  will also remove sebaceous cyst of chest wall  .  Irrigation and debridement sebaceous cyst 05/16/2011    Procedure: IRRIGATION AND DEBRIDEMENT SEBACEOUS CYST;  Surgeon: Valarie Merino, MD;  Location: [facility];  Service: General;  Laterality: N/A;  middle of chest on sternum  . Umbilical hernia repair 05/16/2011    Procedure: HERNIA REPAIR UMBILICAL ADULT;  Surgeon: Valarie Merino, MD;  Location: [facility];  Service: General;  Laterality: N/A;  with mesh   . Hernia repair 05/16/2011    hiatal hernia  . Colonoscopy     Family History  Problem Relation Age of Onset  . Colon cancer Neg Hx   . Ulcers Paternal Grandmother   . Ulcers Father   . Alcohol abuse Father     History  Substance Use Topics  . Smoking status: Never Smoker   . Smokeless tobacco: Never Used  . Alcohol Use: 1.0 oz/week    2 drink(s) per week     4 ounces of wine daily    OB History    Grav Para Term Preterm Abortions TAB SAB Ect Mult Living                  Review of Systems  Constitutional:       HPI  HENT:       HPI otherwise negative  Eyes: Negative.   Respiratory:       HPI, otherwise negative  Cardiovascular:       HPI, otherwise nmegative  Gastrointestinal: Negative for vomiting.  Genitourinary:  HPI, otherwise negative  Musculoskeletal:       HPI, otherwise negative  Skin: Negative.   Neurological: Negative for syncope.    Allergies  Sulfa antibiotics  Home Medications   Current Outpatient Rx  Name Route Sig Dispense Refill  . HYDROCOD POLST-CPM POLST ER 10-8 MG/5ML PO LQCR Oral Take 5 mLs by mouth every 12 (twelve) hours as needed. For cough/congestion.    Marland Kitchen DEXTROMETHORPHAN POLISTIREX ER 30 MG/5ML PO LQCR Oral Take 60 mg by mouth every 12 (twelve) hours as needed. For cough.    Marland Kitchen VITAMIN D 1000 UNITS PO TABS Oral Take 2,000 Units by mouth daily.      . IBUPROFEN 200 MG PO TABS Oral Take 200-400 mg by mouth every 6 (six) hours as needed. Pain    . METHYLPHENIDATE HCL 10 MG PO TABS Oral Take 10 mg by mouth 2 (two) times daily as  needed. When driving on long trips     . NITROFURANTOIN MACROCRYSTAL 100 MG PO CAPS Oral Take 100 mg by mouth as needed.      BP 147/90  Pulse 73  Resp 24  SpO2 90%  Physical Exam  Nursing note and vitals reviewed. Constitutional: She is oriented to person, place, and time. She appears well-developed and well-nourished. No distress.  HENT:  Head: Normocephalic and atraumatic.  Eyes: Conjunctivae and EOM are normal.  Cardiovascular: Normal rate and regular rhythm.   Pulmonary/Chest: Effort normal and breath sounds normal. No stridor. No respiratory distress.  Abdominal: She exhibits no distension.  Musculoskeletal: She exhibits no edema.  Neurological: She is alert and oriented to person, place, and time. She displays no atrophy and no tremor. No cranial nerve deficit or sensory deficit. She exhibits normal muscle tone. She displays no seizure activity. Coordination normal. GCS eye subscore is 4. GCS verbal subscore is 5. GCS motor subscore is 6.       Patient is symmetric strength in upper and lower extremities, no gross cerebellar changes, no facial asymmetry, no changes in speech  Skin: Skin is warm and dry.  Psychiatric: She has a normal mood and affect.    ED Course  Procedures (including critical care time)   Labs Reviewed  URINALYSIS, ROUTINE W REFLEX MICROSCOPIC  CBC WITH DIFFERENTIAL  COMPREHENSIVE METABOLIC PANEL  LACTIC ACID, PLASMA  CARDIAC PANEL(CRET KIN+CKTOT+MB+TROPI)   No results found.   No diagnosis found.   Cardiac 75 sinus rhythm normal Post oximetry 100% room air normal   Date: 10/09/2011  Rate: 76  Rhythm: normal sinus rhythm  QRS Axis: normal  Intervals: normal  ST/T Wave abnormalities: normal  Conduction Disutrbances: none  Narrative Interpretation: unremarkable   On reevaluation the patient notes that she felt asymptomatic.  MDM  This elderly F presents after several hours of unilateral dysesthesia.  Given her description of a change  in sensation, there was some immediate suspicion of TIA / CVA though the absence of Sx intermittently throughout her habitus would make this an atypical presentation.  The patient was in no distress, with no appreciable deficiencies on my exam.  The patient's ABC D2 score is 3 placing her at very low risk for acute stroke in the next 48 hours.  The patient would need an MRI to complete valuation of possible stroke, and the study is not currently available.  We, the patient, her husband, and I discussed all findings, thoughts, risks and benefits of overnight admission for MRI tomorrow versus outpatient followup tomorrow for MRI as an outpatient.  The patient elected to go home, she was provided return precautions, discharged in stable condition.  Gerhard Munch, MD 10/10/11 [phone]

## 2011-10-09 NOTE — ED Notes (Signed)
Pt states at 1745 drove to play tennis; within 15 mins developed right leg/thigh/buttock/arm/side of face numbness and felt bad all over; took two regular strength aspirin; pt then went to Urgent Care and was told to come to ER.   States continues to have dizziness and numbness on some areas on the right side.  Equal facial grimacing.  Lightheaded continues; feels weak all over; equal grips/equal strength arms/legs; no arm drift.  Cough x 1 wk

## 2011-10-09 NOTE — ED Notes (Signed)
Pt reports that at 17:45 she was driving to the club and when she got there and was getting out of her car began feeling very dizzy, nauseated, and began having numbness/tingling to the right side of her body. Husband and pt deny any slurred speech. No facial droop, slurred speech, or arm drift noted. Grip strength equal bilaterally. Pt reports feeling better now, but continues to have some tingling to right side. NAD noted at this time.

## 2011-10-10 ENCOUNTER — Ambulatory Visit: Payer: Self-pay | Admitting: Family Medicine

## 2011-10-11 ENCOUNTER — Encounter: Payer: Medicare Other | Admitting: Internal Medicine

## 2011-10-13 ENCOUNTER — Encounter (INDEPENDENT_AMBULATORY_CARE_PROVIDER_SITE_OTHER): Payer: Self-pay | Admitting: Surgery

## 2011-10-13 ENCOUNTER — Ambulatory Visit (INDEPENDENT_AMBULATORY_CARE_PROVIDER_SITE_OTHER): Payer: Medicare Other | Admitting: Surgery

## 2011-10-13 VITALS — BP 122/74 | HR 68 | Temp 97.6°F | Resp 14 | Ht 66.0 in | Wt 170.1 lb

## 2011-10-13 DIAGNOSIS — Z09 Encounter for follow-up examination after completed treatment for conditions other than malignant neoplasm: Secondary | ICD-10-CM

## 2011-10-13 DIAGNOSIS — Z9889 Other specified postprocedural states: Secondary | ICD-10-CM

## 2011-10-13 NOTE — Patient Instructions (Addendum)
Followup visit with Dr. Isabel Caprice for cystitis

## 2011-10-13 NOTE — Progress Notes (Signed)
Stephanie Bell 71 y.o.  Body mass index is 27.46 kg/(m^2).  Patient Active Problem List  Diagnosis  . Paraesophageal hernia  . Infected sebaceous cyst  . Hiatal hernia  . S/P Nissen fundoplication (without gastrostomy tube) procedure    Allergies  Allergen Reactions  . Sulfa Antibiotics Swelling and Rash    Past Surgical History  Procedure Date  . Vaginal hysterectomy   . Septoplasty   . Back surgery 05-09-11    x 2 lumbar  . Cholecystectomy 05-09-11    laparoscopic  . Tonsillectomy 05-09-11    Tonsillectomy  . Kidney stone surgery 05-09-11    past hx. and cystoscopy for bladder issues  . Laparoscopic nissen fundoplication 05/16/2011    Procedure: LAPAROSCOPIC NISSEN FUNDOPLICATION;  Surgeon: Valarie Merino, MD;  Location: [facility];  Service: General;  Laterality: N/A;  will also remove sebaceous cyst of chest wall  . Irrigation and debridement sebaceous cyst 05/16/2011    Procedure: IRRIGATION AND DEBRIDEMENT SEBACEOUS CYST;  Surgeon: Valarie Merino, MD;  Location: [facility];  Service: General;  Laterality: N/A;  middle of chest on sternum  . Umbilical hernia repair 05/16/2011    Procedure: HERNIA REPAIR UMBILICAL ADULT;  Surgeon: Valarie Merino, MD;  Location: [facility];  Service: General;  Laterality: N/A;  with mesh   . Hernia repair 05/16/2011    hiatal hernia  . Colonoscopy    HEDRICK,JAMES, MD No diagnosis found.  [name] came in today to discuss numerous medical problems she's had recently. She has had some cystitis with bleeding. She's had some refractory coughing and she had a neurologic episode that around her up in the ER for several hours. She is not complaining of any reflux issues and I don't have any recommendations for her regarding that other than she needs to get in to see a urologist sooner. Her urologist in [place] was going to see her in November I suggested she get in to see Dr. Isabel Caprice who she has seen before within the next few weeks. Matt B.  Daphine Deutscher, MD, [facility] [phone] beeper [phone]  10/13/2011 5:03 PM

## 2011-11-03 ENCOUNTER — Ambulatory Visit (AMBULATORY_SURGERY_CENTER): Payer: Medicare Other | Admitting: *Deleted

## 2011-11-03 VITALS — Ht 66.0 in | Wt 168.0 lb

## 2011-11-03 DIAGNOSIS — Z1211 Encounter for screening for malignant neoplasm of colon: Secondary | ICD-10-CM

## 2011-11-06 ENCOUNTER — Encounter: Payer: Self-pay | Admitting: Internal Medicine

## 2011-11-22 ENCOUNTER — Ambulatory Visit (INDEPENDENT_AMBULATORY_CARE_PROVIDER_SITE_OTHER): Payer: Medicare Other | Admitting: Surgery

## 2011-11-22 ENCOUNTER — Encounter (INDEPENDENT_AMBULATORY_CARE_PROVIDER_SITE_OTHER): Payer: Self-pay | Admitting: Surgery

## 2011-11-22 VITALS — BP 128/90 | HR 72 | Temp 97.6°F | Resp 18 | Ht 65.0 in | Wt 163.8 lb

## 2011-11-22 DIAGNOSIS — R110 Nausea: Secondary | ICD-10-CM

## 2011-11-22 DIAGNOSIS — Z09 Encounter for follow-up examination after completed treatment for conditions other than malignant neoplasm: Secondary | ICD-10-CM

## 2011-11-22 DIAGNOSIS — Z9889 Other specified postprocedural states: Secondary | ICD-10-CM

## 2011-11-22 MED ORDER — PROMETHAZINE HCL 6.25 MG/5ML PO SYRP: 12.5000 mg | ORAL_SOLUTION | Freq: Four times a day (QID) | ORAL | Status: DC | PRN

## 2011-11-22 NOTE — Progress Notes (Signed)
Whitley Yohe 71 y.o.  Body mass index is 27.26 kg/(m^2).  Patient Active Problem List  Diagnosis  . Paraesophageal hernia  . Infected sebaceous cyst  . Hiatal hernia  . S/P Nissen fundoplication (without gastrostomy tube) procedure    Allergies  Allergen Reactions  . Sulfa Antibiotics Swelling and Rash    Past Surgical History  Procedure Date  . Vaginal hysterectomy   . Septoplasty   . Back surgery 05-09-11    x 2 lumbar  . Cholecystectomy 05-09-11    laparoscopic  . Tonsillectomy 05-09-11    Tonsillectomy  . Kidney stone surgery 05-09-11    past hx. and cystoscopy for bladder issues  . Laparoscopic nissen fundoplication 05/16/2011    Procedure: LAPAROSCOPIC NISSEN FUNDOPLICATION;  Surgeon: Valarie Merino, MD;  Location: [facility];  Service: General;  Laterality: N/A;  will also remove sebaceous cyst of chest wall  . Irrigation and debridement sebaceous cyst 05/16/2011    Procedure: IRRIGATION AND DEBRIDEMENT SEBACEOUS CYST;  Surgeon: Valarie Merino, MD;  [facility]: [facility];  Service: General;  Laterality: N/A;  middle of chest on sternum  . Umbilical hernia repair 05/16/2011    Procedure: HERNIA REPAIR UMBILICAL ADULT;  Surgeon: Valarie Merino, MD;  [facility]: [facility];  Service: General;  Laterality: N/A;  with mesh   . Hernia repair 05/16/2011    hiatal hernia  . Colonoscopy    HEDRICK,JAMES, MD No diagnosis found.  Mr and Mrs Icard return today because of nausea.  She reports nausea with the smell of food and is not try D. March the last couple of days. She is passing some stool and gas although she describes her bowel movements as pellets. She denies any vomiting but just nausea. She's had a previous cholecystectomy.  On physical exam I don't feel any palpable abdominal wall hernias. She is not tachycardiac. Abdominal exam is nontender and she has bowel sounds. No rebound or guarding.  A CT scan would hopefully help [place] rule out any hernias in today we'll [place] look at her wrap.   Will try to schedule for tomorrow. [name], MD, [facility] [phone] beeper [phone]  11/22/2011 6:09 PM

## 2011-11-22 NOTE — Patient Instructions (Addendum)
Stay on Gatorade.  Will schedule CT abdomen/pelvis for tomorrow.  Nothing to eat after midnight

## 2011-11-23 ENCOUNTER — Encounter: Payer: Medicare Other | Admitting: Internal Medicine

## 2011-11-23 ENCOUNTER — Ambulatory Visit
Admission: RE | Admit: 2011-11-23 | Discharge: 2011-11-23 | Disposition: A | Discharge status: Home / Self Care | Payer: Medicare Other | Source: Ambulatory Visit | Attending: Surgery | Admitting: Surgery

## 2011-11-23 ENCOUNTER — Other Ambulatory Visit: Payer: Medicare Other

## 2011-11-23 DIAGNOSIS — R110 Nausea: Secondary | ICD-10-CM

## 2011-11-23 DIAGNOSIS — Z9889 Other specified postprocedural states: Secondary | ICD-10-CM

## 2011-11-23 MED ORDER — IOHEXOL 300 MG/ML  SOLN
100.0000 mL | Freq: Once | INTRAMUSCULAR | Status: AC | PRN
  Administered 2011-11-23: 100 mL via INTRAVENOUS

## 2011-11-23 MED ORDER — IOHEXOL 300 MG/ML  SOLN
10.0000 mL | Freq: Once | INTRAMUSCULAR | Status: AC | PRN
  Administered 2011-11-23: 10 mL via ORAL

## 2011-11-23 NOTE — Addendum Note (Signed)
Addended byLiliana Cline on: 11/23/2011 10:00 AM   Modules accepted: Orders

## 2011-11-24 ENCOUNTER — Other Ambulatory Visit (INDEPENDENT_AMBULATORY_CARE_PROVIDER_SITE_OTHER): Payer: Self-pay | Admitting: Surgery

## 2011-11-24 NOTE — Telephone Encounter (Signed)
PT CALLED REQUESTING A REFILL FOR PROMETHAZINE LIQUID THAT DR. MARTIN ORDERED AT LAST OFFICE VISIT. I REVIEWED THIS WITH DR. Warren Lacy AND HE OK'D REILL/ I CALLED [facility]/ [address], [address]/ [phone] AND APPROVED REFILL X1/ PT NOTIFIED/GY/PT [facility] APPT WITH DR.MARTIN 11-27-11.

## 2011-11-27 ENCOUNTER — Ambulatory Visit (INDEPENDENT_AMBULATORY_CARE_PROVIDER_SITE_OTHER): Payer: Medicare Other | Admitting: Surgery

## 2011-11-27 ENCOUNTER — Encounter (INDEPENDENT_AMBULATORY_CARE_PROVIDER_SITE_OTHER): Payer: Self-pay | Admitting: Surgery

## 2011-11-27 VITALS — BP 116/78 | HR 98 | Temp 96.8°F | Resp 18 | Ht 65.0 in | Wt 163.6 lb

## 2011-11-27 DIAGNOSIS — K449 Diaphragmatic hernia without obstruction or gangrene: Secondary | ICD-10-CM

## 2011-11-27 NOTE — Assessment & Plan Note (Signed)
With paroxsymal coughing and exercise, she appears to have pulled her wrap up into her chest.  Will get UGI to define

## 2011-11-27 NOTE — Patient Instructions (Signed)
Nissen Fundoplication Care After Please read the instructions outlined below and refer to this sheet for the next few weeks. These discharge instructions provide you with general information on caring for yourself after you leave the hospital. Your doctor may also give you specific instructions. While your treatment has been planned according to the most current medical practices available, unavoidable complications sometimes happen. If you have any problems or questions after discharge, please call your doctor. ACTIVITY  Take frequent rest periods throughout the day.   Take frequent walks throughout the day. This will help to prevent blood clots.   Continue to do your coughing and deep breathing exercises once you get home. This will help to prevent pneumonia.   No strenuous activities such as heavy lifting, pushing or pulling until after your follow-up visit with your doctor. Do not lift anything heavier than 10 pounds.   Talk with your caregiver about when you may return to work and your exercise routine.   You may shower 2 days after surgery. Pat incisions dry. Do not rub incisions with washcloth or towel.   Do not drive while taking prescription pain medication.  NUTRITION  Continue with a liquid diet, or the diet you were directed to take, until your first follow-up visit with your surgeon.   Drink fluids (6-8 glasses a day).   Call your caregiver for persistent nausea (feeling sick to your stomach), vomiting, bloating or difficulty swallowing.  ELIMINATION It is very important not to strain during bowel movements. If constipation should occur, you may:  Take a mild laxative (such as Milk of Magnesia).   Add fruit and bran to your diet.   Drink more fluids.   Call your caregiver if constipation is not relieved.  FEVER If you feel feverish or have shaking chills, take your temperature. If it is 102 F (38.9 C) or above, call your caregiver. The fever may mean there is an  infection. PAIN CONTROL  If a prescription was given for a pain reliever, please follow your caregiver's directions.   Only take over-the-counter or prescription medicines for pain, discomfort, or fever as directed by your caregiver.   If the pain is not relieved by your medicine, becomes worse, or you have difficulty breathing, call your doctor.  INCISION  It is normal for your cuts (incisions) from surgery to have a small amount of drainage for the first 1-2 days. Once the drainage has stopped, leave your incision(s) open to air.   Check your incision(s) and surrounding area daily for any redness, swelling, increased drainage or bleeding. If any of these are present or if the wound edges start to separate, call your doctor.   If you have small adhesive strips in place, they will peel and fall off. (If these strips are covered with a clear bandage, your doctor will tell you when to remove them.)   If you have staples, your caregiver will remove them at the follow-up appointment.  Document Released: 10/28/2003 Document Revised: 02/23/2011 Document Reviewed: 01/31/2007 [employer] Patient Information 2012 [employer]. 

## 2011-11-27 NOTE — Progress Notes (Signed)
Stephanie Bell 71 y.o.  Body mass index is 27.22 kg/(m^2).  Patient Active Problem List  Diagnosis  . Paraesophageal hernia  . Infected sebaceous cyst  . Hiatal hernia  . S/P Nissen fundoplication (without gastrostomy tube) procedure    Allergies  Allergen Reactions  . Sulfa Antibiotics Swelling and Rash    Past Surgical History  Procedure Date  . Vaginal hysterectomy   . Septoplasty   . Back surgery 05-09-11    x 2 lumbar  . Cholecystectomy 05-09-11    laparoscopic  . Tonsillectomy 05-09-11    Tonsillectomy  . Kidney stone surgery 05-09-11    past hx. and cystoscopy for bladder issues  . Laparoscopic nissen fundoplication 05/16/2011    Procedure: LAPAROSCOPIC NISSEN FUNDOPLICATION;  Surgeon: Valarie Merino, MD;  Location: [facility];  Service: General;  Laterality: N/A;  will also remove sebaceous cyst of chest wall  . Irrigation and debridement sebaceous cyst 05/16/2011    Procedure: IRRIGATION AND DEBRIDEMENT SEBACEOUS CYST;  Surgeon: Valarie Merino, MD;  Location: [facility];  Service: General;  Laterality: N/A;  middle of chest on sternum  . Umbilical hernia repair 05/16/2011    Procedure: HERNIA REPAIR UMBILICAL ADULT;  Surgeon: Valarie Merino, MD;  Location: [facility];  Service: General;  Laterality: N/A;  with mesh   . Hernia repair 05/16/2011    hiatal hernia  . Colonoscopy    HEDRICK,JAMES, MD 1. Paraesophageal hernia     CT scan suggests that portion of wrap is up in chest.  Need UGI to help define and will likely need repeat laparoscopy and repair of hiatus hernia.  Discussed with patient and her husband.  This may have been related to the coughing that she has done and may have been made worse by her intense tennis playing.  Will see back after UGI to discuss surgery. [name], MD, [facility] [phone] beeper [phone]  11/27/2011 9:54 AM

## 2011-11-28 ENCOUNTER — Ambulatory Visit
Admission: RE | Admit: 2011-11-28 | Discharge: 2011-11-28 | Disposition: A | Discharge status: Home / Self Care | Payer: Medicare Other | Source: Ambulatory Visit | Attending: Surgery | Admitting: Surgery

## 2011-11-28 DIAGNOSIS — K449 Diaphragmatic hernia without obstruction or gangrene: Secondary | ICD-10-CM

## 2011-11-30 ENCOUNTER — Telehealth (INDEPENDENT_AMBULATORY_CARE_PROVIDER_SITE_OTHER): Payer: Self-pay

## 2011-11-30 ENCOUNTER — Telehealth (INDEPENDENT_AMBULATORY_CARE_PROVIDER_SITE_OTHER): Payer: Self-pay | Admitting: General Surgery

## 2011-11-30 NOTE — Telephone Encounter (Signed)
Spoke with pt and informed her that she is not an emergency case for a hiatal hernia and it can wait until we see her on 12/06/11.  She was fine with this.  She did inform me that she had a better night then the one before so she was feeling a little better.

## 2011-11-30 NOTE — Telephone Encounter (Signed)
Patient called into office to report stomach discomfort with shortness of breath at nighttime, she states she's trying to eat but, continues to have nausea.  Patient would like to know if she's at risk for emergency surgery.  Patient would like to know her results of her UGI on 11/28/11.  Follow up appointment scheduled for 12/06/11 @ 11:30 w/Dr. Daphine Deutscher.

## 2011-12-06 ENCOUNTER — Encounter (INDEPENDENT_AMBULATORY_CARE_PROVIDER_SITE_OTHER): Payer: Self-pay | Admitting: Surgery

## 2011-12-06 ENCOUNTER — Ambulatory Visit (INDEPENDENT_AMBULATORY_CARE_PROVIDER_SITE_OTHER): Payer: Medicare Other | Admitting: Surgery

## 2011-12-06 ENCOUNTER — Other Ambulatory Visit (INDEPENDENT_AMBULATORY_CARE_PROVIDER_SITE_OTHER): Payer: Self-pay | Admitting: Surgery

## 2011-12-06 VITALS — BP 140/84 | HR 70 | Temp 97.4°F | Resp 16 | Ht 65.0 in | Wt 164.4 lb

## 2011-12-06 DIAGNOSIS — Z9889 Other specified postprocedural states: Secondary | ICD-10-CM

## 2011-12-06 DIAGNOSIS — Z09 Encounter for follow-up examination after completed treatment for conditions other than malignant neoplasm: Secondary | ICD-10-CM

## 2011-12-06 NOTE — Patient Instructions (Addendum)
   Will proceed with surgery to repair herniation above your wrap.  This will likely involve taking down the old wrap, repairing the diaphragm with a biologic mesh and revising the Nissen fundoplication.    Nissen Fundoplication Care After Please read the instructions outlined below and refer to this sheet for the next few weeks. These discharge instructions provide you with general information on caring for yourself after you leave the hospital. Your doctor may also give you specific instructions. While your treatment has been planned according to the most current medical practices available, unavoidable complications sometimes happen. If you have any problems or questions after discharge, please call your doctor. ACTIVITY  Take frequent rest periods throughout the day.   Take frequent walks throughout the day. This will help to prevent blood clots.   Continue to do your coughing and deep breathing exercises once you get home. This will help to prevent pneumonia.   No strenuous activities such as heavy lifting, pushing or pulling until after your follow-up visit with your doctor. Do not lift anything heavier than 10 pounds.   Talk with your caregiver about when you may return to work and your exercise routine.   You may shower 2 days after surgery. Pat incisions dry. Do not rub incisions with washcloth or towel.   Do not drive while taking prescription pain medication.  NUTRITION  Continue with a liquid diet, or the diet you were directed to take, until your first follow-up visit with your surgeon.   Drink fluids (6-8 glasses a day).   Call your caregiver for persistent nausea (feeling sick to your stomach), vomiting, bloating or difficulty swallowing.  ELIMINATION It is very important not to strain during bowel movements. If constipation should occur, you may:  Take a mild laxative (such as Milk of Magnesia).   Add fruit and bran to your diet.   Drink more fluids.   Call your  caregiver if constipation is not relieved.  FEVER If you feel feverish or have shaking chills, take your temperature. If it is 102 F (38.9 C) or above, call your caregiver. The fever may mean there is an infection. PAIN CONTROL  If a prescription was given for a pain reliever, please follow your caregiver's directions.   Only take over-the-counter or prescription medicines for pain, discomfort, or fever as directed by your caregiver.   If the pain is not relieved by your medicine, becomes worse, or you have difficulty breathing, call your doctor.  INCISION  It is normal for your cuts (incisions) from surgery to have a small amount of drainage for the first 1-2 days. Once the drainage has stopped, leave your incision(s) open to air.   Check your incision(s) and surrounding area daily for any redness, swelling, increased drainage or bleeding. If any of these are present or if the wound edges start to separate, call your doctor.   If you have small adhesive strips in place, they will peel and fall off. (If these strips are covered with a clear bandage, your doctor will tell you when to remove them.)   If you have staples, your caregiver will remove them at the follow-up appointment.  Document Released: 10/28/2003 Document Revised: 02/23/2011 Document Reviewed: 01/31/2007 [facility] Patient Information 2012 [place], [place].

## 2011-12-06 NOTE — Assessment & Plan Note (Signed)
Back to OR to try to repair diaphragm and re wrap patient

## 2011-12-06 NOTE — Progress Notes (Signed)
Chief Complaint:  Acid reflux  History of Present Illness:  Stephanie Bell is an 71 y.o. female who with repeat episodes of coughing and I think aggressive tennis play has caused her to herniated portion of her stomach and the wrap into her mediastinum. I spoke with her and her husband about this and she expressed her confidence in my caring for her. I told her that this would be the only way that we could try to get this resolved short taking medications. She wants to try to be able to get back on the tennis cords but I am concerned and expressed her that tennis may not be a good thing may have contributed to this recurring.  For the moment we will reschedule her for redo lap hiatal hernia repair and Nissen fundoplication. She's aware of the areas the chance of failure with this.  Past Medical History  Diagnosis Date  . Interstitial cystitis   . Hx: UTI (urinary tract infection)   . Weight loss   . Wound disruption, post-op, skin   . Pneumonia 05-09-11    2011 last time  . Paraesophageal hernia     Past Surgical History  Procedure Date  . Vaginal hysterectomy   . Septoplasty   . Back surgery 05-09-11    x 2 lumbar  . Cholecystectomy 05-09-11    laparoscopic  . Tonsillectomy 05-09-11    Tonsillectomy  . Kidney stone surgery 05-09-11    past hx. and cystoscopy for bladder issues  . Laparoscopic nissen fundoplication 05/16/2011    Procedure: LAPAROSCOPIC NISSEN FUNDOPLICATION;  Surgeon: Valarie Merino, MD;  Location: [facility];  Service: General;  Laterality: N/A;  will also remove sebaceous cyst of chest wall  . Irrigation and debridement sebaceous cyst 05/16/2011    Procedure: IRRIGATION AND DEBRIDEMENT SEBACEOUS CYST;  Surgeon: Valarie Merino, MD;  Location: [facility];  Service: General;  Laterality: N/A;  middle of chest on sternum  . Umbilical hernia repair 05/16/2011    Procedure: HERNIA REPAIR UMBILICAL ADULT;  Surgeon: Valarie Merino, MD;  Location: [facility];  Service: General;   Laterality: N/A;  with mesh   . Hernia repair 05/16/2011    hiatal hernia  . Colonoscopy     Current Outpatient Prescriptions  Medication Sig Dispense Refill  . aspirin 81 MG tablet Take 81 mg by mouth daily.      . cholecalciferol (VITAMIN D) 1000 UNITS tablet Take 2,000-3,000 Units by mouth daily.       . methylphenidate (RITALIN) 10 MG tablet Take 10 mg by mouth as needed. When driving on long trips       . nitrofurantoin (MACRODANTIN) 100 MG capsule Take 100 mg by mouth daily as needed. For bladder infections.      . promethazine (PHENERGAN) 6.25 MG/5ML syrup TAKE TWO TEASPOONSFULS BY MOUTH 4 TIMES DAILY AS NEEDED FOR NAUSEA  120 mL  0   Sulfa antibiotics Family History  Problem Relation Age of Onset  . Colon cancer Neg Hx   . Ulcers Paternal Grandmother   . Ulcers Father   . Alcohol abuse Father    Social History:   reports that she has never smoked. She has never used smokeless tobacco. She reports that she drinks about 4.2 ounces of alcohol per week. She reports that she does not use illicit drugs.   REVIEW OF SYSTEMS - PERTINENT POSITIVES ONLY: Noncontributory  Physical Exam:   Blood pressure 140/84, pulse 70, temperature 97.4 F (36.3 C), temperature  source Temporal, resp. rate 16, height 5\' 5"  (1.651 m), weight 164 lb 6 oz (74.56 kg). Body mass index is 27.35 kg/(m^2).  Gen:  WDWN WF NAD  Neurological: Alert and oriented to person, place, and time. Motor and sensory function is grossly intact  Head: Normocephalic and atraumatic.  Eyes: Conjunctivae are normal. Pupils are equal, round, and reactive to light. No scleral icterus.  Neck: Normal range of motion. Neck supple. No tracheal deviation or thyromegaly present.  Cardiovascular:  SR without murmurs or gallops.  No carotid bruits Respiratory: Effort normal.  No respiratory distress. No chest wall tenderness. Breath sounds normal.  No wheezes, rales or rhonchi.  Abdomen:  nontender GU: Musculoskeletal: Normal  range of motion. Extremities are nontender. No cyanosis, edema or clubbing noted Lymphadenopathy: No cervical, preauricular, postauricular or axillary adenopathy is present Skin: Skin is warm and dry. No rash noted. No diaphoresis. No erythema. No pallor. Pscyh: Normal mood and affect. Behavior is normal. Judgment and thought content normal.   LABORATORY RESULTS: No results found for this or any previous visit (from the past 48 hour(s)).  RADIOLOGY RESULTS: No results found.  Problem List: Patient Active Problem List  Diagnosis  . Paraesophageal hernia  . Infected sebaceous cyst  . Hiatal hernia  . S/P Nissen fundoplication (without gastrostomy tube) procedure    Assessment & Plan: Herniation of recent wrap (Feb 2013) that is symptomatic.  I have discussed and she wants to have repeat surgery.  I think that her coughing and lunges on the tennis court have promoted this.  Will likely need repeat surgery with mesh on diaphragm.      [name], MD, [facility] [phone] beeper [phone]  12/06/2011 12:01 PM

## 2011-12-12 ENCOUNTER — Other Ambulatory Visit: Payer: Self-pay | Admitting: Emergency Medicine

## 2011-12-12 ENCOUNTER — Telehealth: Payer: Self-pay | Admitting: *Deleted

## 2011-12-12 MED ORDER — CEPHALEXIN 500 MG PO CAPS: 500.0000 mg | ORAL_CAPSULE | Freq: Four times a day (QID) | ORAL | Status: DC

## 2011-12-12 NOTE — Telephone Encounter (Signed)
Patient called with concerns about area on right breast.  States red and painfull.  Denies any drainage.  Patient visiting family today and can not come into office.   Will prescribe Keflex per AB and have patient schedule appointment when back in town.

## 2011-12-12 NOTE — Telephone Encounter (Signed)
Patient called reporting she has a new area on her rt. Breast.  Started three to four days ago.  A nodule has come up on her right breast that's hot to touch, tender and sore.  Temperature = 97.0.  Reports her temperature goes down under when she is sick.  Denies drainage but it is red.  Would like Dr. Darnelle Catalan would like to see this.  Will notify providers and instructed her to call the surgeon she mentioned having seen for a previous problem with rt. breast.

## 2011-12-13 ENCOUNTER — Encounter (INDEPENDENT_AMBULATORY_CARE_PROVIDER_SITE_OTHER): Payer: Self-pay | Admitting: Surgery

## 2011-12-13 ENCOUNTER — Ambulatory Visit (HOSPITAL_BASED_OUTPATIENT_CLINIC_OR_DEPARTMENT_OTHER): Payer: Medicare Other | Admitting: Physician Assistant

## 2011-12-13 ENCOUNTER — Ambulatory Visit (INDEPENDENT_AMBULATORY_CARE_PROVIDER_SITE_OTHER): Payer: Medicare Other | Admitting: Surgery

## 2011-12-13 ENCOUNTER — Encounter: Payer: Self-pay | Admitting: Physician Assistant

## 2011-12-13 ENCOUNTER — Other Ambulatory Visit (HOSPITAL_BASED_OUTPATIENT_CLINIC_OR_DEPARTMENT_OTHER): Payer: Medicare Other | Admitting: Lab

## 2011-12-13 VITALS — BP 108/70 | HR 72 | Temp 98.0°F | Resp 16 | Ht 65.0 in | Wt 165.0 lb

## 2011-12-13 VITALS — BP 116/75 | HR 74 | Temp 97.8°F | Resp 20 | Ht 65.08 in | Wt 165.5 lb

## 2011-12-13 DIAGNOSIS — L089 Local infection of the skin and subcutaneous tissue, unspecified: Secondary | ICD-10-CM

## 2011-12-13 DIAGNOSIS — N6009 Solitary cyst of unspecified breast: Secondary | ICD-10-CM

## 2011-12-13 DIAGNOSIS — L0390 Cellulitis, unspecified: Secondary | ICD-10-CM

## 2011-12-13 DIAGNOSIS — C50919 Malignant neoplasm of unspecified site of unspecified female breast: Secondary | ICD-10-CM

## 2011-12-13 DIAGNOSIS — L723 Sebaceous cyst: Secondary | ICD-10-CM

## 2011-12-13 LAB — CBC WITH DIFFERENTIAL/PLATELET
Basophils Absolute: 0 10*3/uL (ref 0.0–0.1)
EOS%: 1.5 % (ref 0.0–7.0)
HCT: 37.9 % (ref 34.8–46.6)
HGB: 12.6 g/dL (ref 11.6–15.9)
MCH: 25.6 pg (ref 25.1–34.0)
MCV: 77.1 fL — ABNORMAL LOW (ref 79.5–101.0)
MONO%: 7.7 % (ref 0.0–14.0)
NEUT%: 65.3 % (ref 38.4–76.8)
lymph#: 1.4 10*3/uL (ref 0.9–3.3)

## 2011-12-13 NOTE — Progress Notes (Signed)
Subjective:     Patient ID: Stephanie Bell, female   DOB: [birth year], 71 y.o.   MRN: [id number]  HPI She is well known to our practice. She has developed what appears to be another infected sebaceous cyst at the excision site on the right chest. She has just been started on Keflex. She reports as flareup several days ago. She is scheduled to have a lap Nissen by Dr. Daphine Deutscher in November  Review of Systems     Objective:   Physical Exam There is indeed an area of fluctuance and erythema at the old incision on the right breast. I prepped this with Betadine, made an incision with a scalpel, and draining purulence from this area I then packed with gauze    Assessment:      infected sebaceous cyst    Plan:     Wound care instructions were given. I will have her follow up with Dr. Daphine Deutscher in the next week or 2.

## 2011-12-13 NOTE — Patient Instructions (Signed)
Remove packing in shower tomorrow a.m.  Repack with corner of a guaze daily

## 2011-12-13 NOTE — Progress Notes (Signed)
ID: Everardo All   DOB: 21-Nov-1940  MR#: [id number]  CSN#:[id number]  PCP: [name], MD GYN:  SU:  OTHER MD:   HISTORY OF PRESENT ILLNESS: Meleena noted a new "bump" in the middle of her chest in the summer 2012. She found it was growing rapidly and was hot. She 2 was concerned that this might represent breast cancer, and was subsequently evaluated by Dr. Darnelle Catalan.  This lesion was diagnosed as an infected sebaceous cyst. Dr. Janee Morn performed I and D in December 2012 and  the area healed well. While [name] was undergoing additional surgery in February of 2013 under the care of Dr. Daphine Deutscher, she had the sebaceous cyst excised, again with no complications.  INTERVAL HISTORY: [name] returns today for further evaluation of the area of redness on her right breast. She noted redness, tenderness, and swelling on the medial aspect of her right breast approximately 3 or 4 days ago. This is in the same area of the previous sebaceous cyst excision performed in February 2013.   She was out of town yesterday and could not make it into the office for evaluation, so we called in a prescription for Keflex which she started last night, 500 mg by mouth 4 times a day. She is already seeing a small amount of improvement after only 2 doses.   REVIEW OF SYSTEMS: [name] has had no fevers or chills. She denies any additional rashes or skin changes elsewhere. She does have some reflux, nausea, and problems eating due to a current issue with a hiatal hernia which is being followed by Dr. Daphine Deutscher. In fact she anticipates corrective surgery in November. She's had no cough or shortness of breath.  In fact a detailed review of systems is otherwise noncontributory.   PAST MEDICAL HISTORY: Past Medical History  Diagnosis Date  . Interstitial cystitis   . Hx: UTI (urinary tract infection)   . Weight loss   . Wound disruption, post-op, skin   . Pneumonia 05-09-11    2011 last time  . Paraesophageal hernia     PAST  SURGICAL HISTORY: Past Surgical History  Procedure Date  . Vaginal hysterectomy   . Septoplasty   . Back surgery 05-09-11    x 2 lumbar  . Cholecystectomy 05-09-11    laparoscopic  . Tonsillectomy 05-09-11    Tonsillectomy  . Kidney stone surgery 05-09-11    past hx. and cystoscopy for bladder issues  . Laparoscopic nissen fundoplication 05/16/2011    Procedure: LAPAROSCOPIC NISSEN FUNDOPLICATION;  Surgeon: Valarie Merino, MD;  Location: [facility];  Service: General;  Laterality: N/A;  will also remove sebaceous cyst of chest wall  . Irrigation and debridement sebaceous cyst 05/16/2011    Procedure: IRRIGATION AND DEBRIDEMENT SEBACEOUS CYST;  Surgeon: Valarie Merino, MD;  Location: [facility];  Service: General;  Laterality: N/A;  middle of chest on sternum  . Umbilical hernia repair 05/16/2011    Procedure: HERNIA REPAIR UMBILICAL ADULT;  Surgeon: Valarie Merino, MD;  Location: [facility];  Service: General;  Laterality: N/A;  with mesh   . Hernia repair 05/16/2011    hiatal hernia  . Colonoscopy     FAMILY HISTORY Family History  Problem Relation Age of Onset  . Colon cancer Neg Hx   . Ulcers Paternal Grandmother   . Ulcers Father   . Alcohol abuse Father     GYNECOLOGIC HISTORY: The patient is GX P3, when necessary age 8, first pregnancy to term age 51. She took [employer]  before her hysterectomy, and not afterwards.  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 Her husband, [name], is a retired Chief Strategy Officer, working most recently at [employer] for 21 years. Son [name], 52, lives in [place], where he used to work in Education officer, environmental, is now retired. Son [name], worked as a Chartered loss adjuster, is now retired. He is 50. [name], 47, lives in [place] where she works in [employer] The patient has 6 grandchildren she attends [place]    ADVANCED DIRECTIVES:  HEALTH MAINTENANCE: History  Substance Use Topics  .  Smoking status: Never Smoker   . Smokeless tobacco: Never Used  . Alcohol Use: 4.2 oz/week    7 Glasses of wine per week     4 ounces of wine daily    Mammogram: more than 1 year ago  Colonoscopy: Dr, [name], 2012  PAP:  Bone density:  Lipid panel:  Allergies  Allergen Reactions  . Sulfa Antibiotics Swelling and Rash    Current Outpatient Prescriptions  Medication Sig Dispense Refill  . cephALEXin (KEFLEX) 500 MG capsule Take 1 capsule (500 mg total) by mouth 4 (four) times daily.  40 capsule  0  . cholecalciferol (VITAMIN D) 1000 UNITS tablet Take 2,000-3,000 Units by mouth daily.       Marland Kitchen aspirin 81 MG tablet Take 81 mg by mouth daily.      . methylphenidate (RITALIN) 10 MG tablet Take 10 mg by mouth as needed. When driving on long trips       . nitrofurantoin (MACRODANTIN) 100 MG capsule Take 100 mg by mouth daily as needed. For bladder infections.      . promethazine (PHENERGAN) 6.25 MG/5ML syrup TAKE TWO TEASPOONSFULS BY MOUTH 4 TIMES DAILY AS NEEDED FOR NAUSEA  120 mL  0    OBJECTIVE: White female who appears comfortable and in no acute distress Filed Vitals:   12/13/11 1157  BP: 116/75  Pulse: 74  Temp: 97.8 F (36.6 C)  Resp: 20     Body mass index is 27.47 kg/(m^2).    ECOG FS: 1  Sclerae unicteric Oropharynx clear No peripheral adenopathy Lungs clear to auscultation with no rales or rhonchi Heart regular rate and rhythm Abd soft and nontender, with positive bowel sounds Breasts: There is an erythematous, slightly indurated area in the medial aspect of the right breast, in the incision area of the previous sebaceous cyst excision. The area is warm and tender to touch. No obvious drainage or discharge. The right breast is otherwise unremarkable.  LAB RESULTS: Lab Results  Component Value Date   WBC 5.7 12/13/2011   NEUTROABS 3.7 12/13/2011   HGB 12.6 12/13/2011   HCT 37.9 12/13/2011   MCV 77.1* 12/13/2011   PLT 143* 12/13/2011      Chemistry      Component  Value Date/Time   NA 135 10/09/2011 2119   K 3.9 10/09/2011 2119   CL 97 10/09/2011 2119   CO2 27 10/09/2011 2119   BUN 10 10/09/2011 2119   CREATININE 0.76 10/09/2011 2119      Component Value Date/Time   CALCIUM 9.3 10/09/2011 2119   ALKPHOS 93 10/09/2011 2119   AST 18 10/09/2011 2119   ALT 11 10/09/2011 2119   BILITOT 0.5 10/09/2011 2119       STUDIES: No applicable studies found.  ASSESSMENT: 71 y.o. [place] woman with   (1) a history of an infected sebaceous cyst involving the  medial aspect of the right breast, emerging February 23, 2011 without apparent provocation. I and D. was performed in December, followed by an excision of this sebaceous cyst in February 2013.   PLAN: Kasie will continue on Keflex as prescribed, 500 mg 4 times daily for total of 10 days. She's been scheduled to be seen by Dr. Rayburn Ma later this afternoon for further evaluation, to determine whether or not this will need additional treatment. She will call us back later this week with an update, but will call earlier with any changes or problems. Adilene voices understanding and agreement with this plan.   Anthone Prieur    12/13/2011

## 2011-12-20 ENCOUNTER — Encounter (INDEPENDENT_AMBULATORY_CARE_PROVIDER_SITE_OTHER): Payer: Self-pay | Admitting: Surgery

## 2011-12-20 ENCOUNTER — Ambulatory Visit (INDEPENDENT_AMBULATORY_CARE_PROVIDER_SITE_OTHER): Payer: Medicare Other | Admitting: Surgery

## 2011-12-20 VITALS — BP 132/76 | HR 74 | Temp 98.2°F | Resp 18 | Ht 65.0 in | Wt 167.6 lb

## 2011-12-20 DIAGNOSIS — N6089 Other benign mammary dysplasias of unspecified breast: Secondary | ICD-10-CM

## 2011-12-20 NOTE — Patient Instructions (Signed)
Thanks for your patience.  If you need further assistance after leaving the office, please call our office and speak with a [facility] nurse.  [phone].  If you want to leave a message for Dr. Maddalynn Barnard, please call his office phone at [phone]. 

## 2011-12-20 NOTE — Progress Notes (Signed)
Stephanie Bell 71 y.o.  Body mass index is 27.89 kg/(m^2).  Patient Active Problem List  Diagnosis  . Paraesophageal hernia  . Hiatal hernia  . S/P Nissen fundoplication (without gastrostomy tube) procedure  . Breast cancer    Allergies  Allergen Reactions  . Sulfa Antibiotics Swelling and Rash    Past Surgical History  Procedure Date  . Vaginal hysterectomy   . Septoplasty   . Back surgery 05-09-11    x 2 lumbar  . Cholecystectomy 05-09-11    laparoscopic  . Tonsillectomy 05-09-11    Tonsillectomy  . Kidney stone surgery 05-09-11    past hx. and cystoscopy for bladder issues  . Laparoscopic nissen fundoplication 05/16/2011    Procedure: LAPAROSCOPIC NISSEN FUNDOPLICATION;  Surgeon: Valarie Merino, MD;  Location: [facility];  Service: General;  Laterality: N/A;  will also remove sebaceous cyst of chest wall  . Irrigation and debridement sebaceous cyst 05/16/2011    Procedure: IRRIGATION AND DEBRIDEMENT SEBACEOUS CYST;  Surgeon: Valarie Merino, MD;  Location: [facility];  Service: General;  Laterality: N/A;  middle of chest on sternum  . Umbilical hernia repair 05/16/2011    Procedure: HERNIA REPAIR UMBILICAL ADULT;  Surgeon: Valarie Merino, MD;  Location: [facility];  Service: General;  Laterality: N/A;  with mesh   . Hernia repair 05/16/2011    hiatal hernia  . Colonoscopy    HEDRICK,JAMES, MD No diagnosis found.  Area of incised sebaceous cyst has healed.  She is still working to try to get her surgery earlier than Nov 22.  Asked to check weekly [name], MD, [facility] [phone] beeper [phone]  12/20/2011 6:25 PM

## 2011-12-31 ENCOUNTER — Other Ambulatory Visit (INDEPENDENT_AMBULATORY_CARE_PROVIDER_SITE_OTHER): Payer: Self-pay | Admitting: Surgery

## 2011-12-31 DIAGNOSIS — K219 Gastro-esophageal reflux disease without esophagitis: Secondary | ICD-10-CM

## 2012-01-07 ENCOUNTER — Emergency Department (HOSPITAL_COMMUNITY)
Admission: EM | Admit: 2012-01-07 | Discharge: 2012-01-07 | Disposition: A | Discharge status: Home / Self Care | Payer: Medicare Other | Attending: Emergency Medicine | Admitting: Emergency Medicine

## 2012-01-07 ENCOUNTER — Emergency Department (HOSPITAL_COMMUNITY): Payer: Medicare Other

## 2012-01-07 ENCOUNTER — Encounter (HOSPITAL_COMMUNITY): Payer: Self-pay | Admitting: *Deleted

## 2012-01-07 DIAGNOSIS — K219 Gastro-esophageal reflux disease without esophagitis: Secondary | ICD-10-CM | POA: Insufficient documentation

## 2012-01-07 DIAGNOSIS — Z9089 Acquired absence of other organs: Secondary | ICD-10-CM | POA: Insufficient documentation

## 2012-01-07 DIAGNOSIS — E876 Hypokalemia: Secondary | ICD-10-CM

## 2012-01-07 LAB — POCT I-STAT TROPONIN I: Troponin i, poc: 0 ng/mL (ref 0.00–0.08)

## 2012-01-07 LAB — CBC WITH DIFFERENTIAL/PLATELET
Basophils Absolute: 0 10*3/uL (ref 0.0–0.1)
HCT: 36.4 % (ref 36.0–46.0)
Lymphocytes Relative: 28 % (ref 12–46)
Lymphs Abs: 1.6 10*3/uL (ref 0.7–4.0)
MCV: 75.7 fL — ABNORMAL LOW (ref 78.0–100.0)
Monocytes Absolute: 0.6 10*3/uL (ref 0.1–1.0)
Neutro Abs: 3.5 10*3/uL (ref 1.7–7.7)
Platelets: 172 10*3/uL (ref 150–400)
RDW: 14.8 % (ref 11.5–15.5)

## 2012-01-07 LAB — COMPREHENSIVE METABOLIC PANEL
AST: 13 U/L (ref 0–37)
Albumin: 3.8 g/dL (ref 3.5–5.2)
Alkaline Phosphatase: 70 U/L (ref 39–117)
BUN: 10 mg/dL (ref 6–23)
Chloride: 103 mEq/L (ref 96–112)
Potassium: 3.3 mEq/L — ABNORMAL LOW (ref 3.5–5.1)
Sodium: 139 mEq/L (ref 135–145)
Total Protein: 6.5 g/dL (ref 6.0–8.3)

## 2012-01-07 MED ORDER — POTASSIUM CHLORIDE CRYS ER 20 MEQ PO TBCR
40.0000 meq | EXTENDED_RELEASE_TABLET | Freq: Once | ORAL | Status: AC
  Administered 2012-01-07: 40 meq via ORAL
  Filled 2012-01-07: qty 2

## 2012-01-07 NOTE — ED Provider Notes (Signed)
History     CSN: [id number]  Arrival date & time 01/07/12  1932   First MD Initiated Contact with Patient 01/07/12 2002      Chief Complaint  Patient presents with  . Abdominal Pain    (Consider location/radiation/quality/duration/timing/severity/associated sxs/prior treatment) HPI History provided by patient and prior chart.  Pt reports that she had a hiatal hernia repair 04/2011.  "Band ruptured" in July during a coughing a spell and she is scheduled to have procedure repeated in Nov.  Has had chronic nausea, early satiety and subsequent fatigue, generalized weakness and weight loss since then.  Comes to ED today because she developed regurgitation after a small meal at 5pm today.  Associated w/ nausea and SOB.  Took her simethicone and antacids and burning sensation in chest and nausea resolved, but she continued to feel SOB and heard a funny sound in her chest that sounded like the refluxed contents were spilling into her lungs.  Was afraid she would need an emergency tracheostomy en route to hospital.  She denies fever, cough, CP, abd pain, diarrhea and urinary sx.  Has had SOB over the past month, for which she has been evaluated by her PCP, but it was worse this evening.  No cardiac history.  Low risk PE and denies LE pain/edema.    Past Medical History  Diagnosis Date  . Interstitial cystitis   . Hx: UTI (urinary tract infection)   . Weight loss   . Wound disruption, post-op, skin   . Pneumonia 05-09-11    2011 last time  . Paraesophageal hernia     Past Surgical History  Procedure Date  . Vaginal hysterectomy   . Septoplasty   . Back surgery 05-09-11    x 2 lumbar  . Cholecystectomy 05-09-11    laparoscopic  . Tonsillectomy 05-09-11    Tonsillectomy  . Kidney stone surgery 05-09-11    past hx. and cystoscopy for bladder issues  . Laparoscopic nissen fundoplication 05/16/2011    Procedure: LAPAROSCOPIC NISSEN FUNDOPLICATION;  Surgeon: Valarie Merino, MD;  Location: [facility];  Service: General;  Laterality: N/A;  will also remove sebaceous cyst of chest wall  . Irrigation and debridement sebaceous cyst 05/16/2011    Procedure: IRRIGATION AND DEBRIDEMENT SEBACEOUS CYST;  Surgeon: Valarie Merino, MD;  Location: [facility];  Service: General;  Laterality: N/A;  middle of chest on sternum  . Umbilical hernia repair 05/16/2011    Procedure: HERNIA REPAIR UMBILICAL ADULT;  Surgeon: Valarie Merino, MD;  Location: [facility];  Service: General;  Laterality: N/A;  with mesh   . Hernia repair 05/16/2011    hiatal hernia  . Colonoscopy     Family History  Problem Relation Age of Onset  . Colon cancer Neg Hx   . Ulcers Paternal Grandmother   . Ulcers Father   . Alcohol abuse Father     History  Substance Use Topics  . Smoking status: Never Smoker   . Smokeless tobacco: Never Used  . Alcohol Use: 4.2 oz/week    7 Glasses of wine per week     4 ounces of wine daily    OB History    Grav Para Term Preterm Abortions TAB SAB Ect Mult Living                  Review of Systems  All other systems reviewed and are negative.    Allergies  Sulfa antibiotics  Home Medications   Current Outpatient  Rx  Name Route Sig Dispense Refill  . VITAMIN D 1000 UNITS PO TABS Oral Take 2,000-3,000 Units by mouth daily.     Marland Kitchen PROMETHAZINE HCL 6.25 MG/5ML PO SYRP Oral Take 12.5 mg by mouth 4 (four) times daily as needed. nausea    . SIMETHICONE 80 MG PO CHEW Oral Chew 80 mg by mouth once.      BP 140/104  Pulse 97  Temp 97.6 F (36.4 C)  Resp 24  SpO2 100%  Physical Exam  Nursing note and vitals reviewed. Constitutional: She is oriented to person, place, and time. She appears well-developed and well-nourished. No distress.       tearful  HENT:  Head: Normocephalic and atraumatic.  Eyes:       Normal appearance  Neck: Normal range of motion.  Cardiovascular: Normal rate and regular rhythm.   Pulmonary/Chest: Effort normal and breath sounds normal. No stridor. No  respiratory distress.       No coughing  Abdominal: Soft. Bowel sounds are normal. She exhibits no distension and no mass. There is no tenderness. There is no rebound and no guarding.  Genitourinary:       No CVA tenderness  Musculoskeletal: Normal range of motion.       No LE edema/ttp  Neurological: She is alert and oriented to person, place, and time.  Skin: Skin is warm and dry. No rash noted.  Psychiatric: Her behavior is normal.       Anxious appearing    ED Course  Procedures (including critical care time)  Date: 01/07/2012  Rate: 81  Rhythm: normal sinus rhythm  QRS Axis: normal  Intervals: normal  ST/T Wave abnormalities: normal  Conduction Disutrbances:none  Narrative Interpretation:   Old EKG Reviewed: unchanged   Labs Reviewed  CBC WITH DIFFERENTIAL - Abnormal; Notable for the following:    MCV 75.7 (*)     MCH 25.8 (*)     All other components within normal limits  COMPREHENSIVE METABOLIC PANEL - Abnormal; Notable for the following:    Potassium 3.3 (*)     Glucose, Bld 105 (*)     GFR calc non Af Amer 72 (*)     GFR calc Af Amer 83 (*)     All other components within normal limits  POCT I-STAT TROPONIN I   Dg Chest 2 View  01/07/2012  *RADIOLOGY REPORT*  Clinical Data: Abdominal pain  CHEST - 2 VIEW  Comparison: 11/23/2011 CT, 10/09/2011 chest radiograph  Findings: Mild hyperexpansion and interstitial coarsening.  No confluent airspace opacity.  No pleural effusion or pneumothorax. Aortic arch atherosclerosis. Small hiatal hernia.  Osteopenia.  IMPRESSION: Chronic changes.  No acute consolidation identified.   Original Report Authenticated By: Waneta Martins, M.D.      1. GERD (gastroesophageal reflux disease)   2. Hypokalemia       MDM  71yo F w/ h/o hiatal hernia repair, scheduled for revision next month d/t complications, presents w/ c/o regurgitation, nausea and SOB that started shortly after eating this evening.  She also had dizziness and  numbness, heard a funny sound in her throat and was afraid that her gastric contents were spilling into lungs.  All sx resolved w/ exception of SOB.  On exam, afebrile, VS w/in nml range, anxious appearing, no respiratory distress, lungs clear, abd benign, no signs of DVT.  CXR neg.  Labs sig for neg troponin and mild hypokalemia.  Results discussed w/ pt.  Her reflux is chronic and  sensation typical this evening but worse, and I suspect that patient became anxious which caused her SOB.  She has a prior h/o anxiety.  She has no cardiac history and reports that she never had exertional SOB/CP and her doctor is aware of the SOB she experiences in association w/ reflux.  Will treat hypokalemia and d/c home w/ strict return precautions.  Pt as well as Dr. Ranae Palms in agreement w/ assessment and plan.     Arie Sabina [place], [place] 01/07/12 2154

## 2012-01-07 NOTE — ED Notes (Signed)
Pt states she had hiatal hernia repair in February; "reruptured" in July; scheduled for repair again in November; for two hours pt having severe upper abd pain; feeling like fluid going into lungs; pale

## 2012-01-07 NOTE — ED Provider Notes (Signed)
Medical screening examination/treatment/procedure(s) were conducted as a shared visit with non-physician practitioner(s) and myself.  I personally evaluated the patient during the encounter   Stephanie Racer, MD 01/07/12 2338

## 2012-01-09 ENCOUNTER — Telehealth (INDEPENDENT_AMBULATORY_CARE_PROVIDER_SITE_OTHER): Payer: Self-pay

## 2012-01-09 NOTE — Telephone Encounter (Signed)
Pt called wanting to know if she can take halcion that was prescribed by her pcp to help with her sleep. I advised her to contact her pcp and review medication list for contraindications and any refills on this medication. Pt states she will call his office.

## 2012-01-12 ENCOUNTER — Encounter (INDEPENDENT_AMBULATORY_CARE_PROVIDER_SITE_OTHER): Payer: Self-pay

## 2012-01-15 ENCOUNTER — Other Ambulatory Visit (INDEPENDENT_AMBULATORY_CARE_PROVIDER_SITE_OTHER): Payer: Self-pay | Admitting: Surgery

## 2012-01-16 ENCOUNTER — Telehealth (INDEPENDENT_AMBULATORY_CARE_PROVIDER_SITE_OTHER): Payer: Self-pay | Admitting: General Surgery

## 2012-01-16 NOTE — Telephone Encounter (Signed)
Pt calling in advance of her lap nissen surgery in November.  She is concerned with inability to eat much, secondary to excessive gas.  Her weight is dropping considerably.  Pt states she has tried all the OTC medicines available.  Strongly advised pt to see her PCP as soon as possible.  She understands and will comply.

## 2012-02-01 ENCOUNTER — Encounter (HOSPITAL_COMMUNITY): Payer: Self-pay | Admitting: Pharmacy Technician

## 2012-02-02 ENCOUNTER — Telehealth (INDEPENDENT_AMBULATORY_CARE_PROVIDER_SITE_OTHER): Payer: Self-pay

## 2012-02-02 ENCOUNTER — Other Ambulatory Visit (INDEPENDENT_AMBULATORY_CARE_PROVIDER_SITE_OTHER): Payer: Self-pay

## 2012-02-02 DIAGNOSIS — K219 Gastro-esophageal reflux disease without esophagitis: Secondary | ICD-10-CM

## 2012-02-02 MED ORDER — PROMETHAZINE HCL 6.25 MG/5ML PO SYRP: 12.5000 mg | ORAL_SOLUTION | Freq: Four times a day (QID) | ORAL | Status: DC | PRN

## 2012-02-02 NOTE — Telephone Encounter (Signed)
Pt called to see if we received refill request on her promethazine. Pt advised we did receive request and it is with our urgent office MD Dr Biagio Quint to review.

## 2012-02-02 NOTE — Telephone Encounter (Signed)
The patient called to give a heads up that she is gonna need a refill on Promethazine 6.25/12ml.  Her Pharmacy should call from [facility] on [address] in [place].  She says it helps her rest.  I told her I would give Penni Bombard a heads up.  She knows Dr Daphine Deutscher is unavailable but Penni Bombard can check with another md.

## 2012-02-02 NOTE — Telephone Encounter (Signed)
Patient notified of refill Promethazine 6.25ML.  Refill request signed by Dr. Biagio Quint & faxed to [facility] [phone].

## 2012-02-05 ENCOUNTER — Telehealth (INDEPENDENT_AMBULATORY_CARE_PROVIDER_SITE_OTHER): Payer: Self-pay | Admitting: General Surgery

## 2012-02-05 ENCOUNTER — Other Ambulatory Visit (INDEPENDENT_AMBULATORY_CARE_PROVIDER_SITE_OTHER): Payer: Self-pay | Admitting: Surgery

## 2012-02-05 ENCOUNTER — Encounter (HOSPITAL_COMMUNITY): Payer: Self-pay

## 2012-02-05 ENCOUNTER — Encounter (HOSPITAL_COMMUNITY)
Admission: RE | Admit: 2012-02-05 | Discharge: 2012-02-05 | Disposition: A | Discharge status: Home / Self Care | Payer: Medicare Other | Source: Ambulatory Visit | Attending: Surgery | Admitting: Surgery

## 2012-02-05 DIAGNOSIS — K219 Gastro-esophageal reflux disease without esophagitis: Secondary | ICD-10-CM

## 2012-02-05 HISTORY — DX: Personal history of other diseases of the digestive system: Z87.19

## 2012-02-05 HISTORY — DX: Gastro-esophageal reflux disease without esophagitis: K21.9

## 2012-02-05 LAB — BASIC METABOLIC PANEL
BUN: 8 mg/dL (ref 6–23)
CO2: 27 mEq/L (ref 19–32)
Chloride: 101 mEq/L (ref 96–112)
GFR calc Af Amer: >90 mL/min (ref >90)
Potassium: 3.7 mEq/L (ref 3.5–5.1)

## 2012-02-05 LAB — SURGICAL PCR SCREEN: Staphylococcus aureus: NEGATIVE

## 2012-02-05 LAB — CBC
HCT: 37.9 % (ref 36.0–46.0)
Platelets: 140 10*3/uL — ABNORMAL LOW (ref 150–400)
RBC: 4.91 MIL/uL (ref 3.87–5.11)
RDW: 14.7 % (ref 11.5–15.5)
WBC: 6 10*3/uL (ref 4.0–10.5)

## 2012-02-05 MED ORDER — PROMETHAZINE HCL 6.25 MG/5ML PO SYRP: 12.5000 mg | ORAL_SOLUTION | Freq: Four times a day (QID) | ORAL | Status: DC | PRN

## 2012-02-05 NOTE — Patient Instructions (Signed)
20      Your procedure is scheduled on:  Friday 02/09/2012 at 0730 am  Report to [facility] at  0530 AM.  Call this number if you have problems the morning of surgery: [phone]   Remember:   Do not eat food or drink liquids after midnight!  Take these medicines the morning of surgery with A SIP OF WATER: Prilosec(Omeprazole)   Do not bring valuables to the hospital.  .  Leave suitcase in the car. After surgery it may be brought to your room.  For patients admitted to the hospital, checkout time is 11:00 AM the day of              Discharge.    Special Instructions: See [facility] Preparing  For Surgery Instruction Sheet. Do not wear jewelry, lotions powders, perfumes. Women do not shave legs or underarms for 12 hours before showers. Contacts, partial plates, or dentures may not be worn into surgery.                          Patients discharged the day of surgery will not be allowed to drive home.  If going home the same day of surgery, must have someone stay with you  first 24 hrs.at home and arrange for someone to drive you home from the Hospital.            YOUR DRIVER IS: Miles-spouse   Please read over the following fact sheets that you were given: MRSA INFORMATION,INCENTIVE SPIROMETRY SHEET, SLEEP APNEA SHEET                            Telford Nab.Munir Victorian,RN,BSN     [phone]

## 2012-02-05 NOTE — Telephone Encounter (Signed)
Spoke with pt and informed her that we refilled her promethazine.

## 2012-02-09 ENCOUNTER — Encounter (HOSPITAL_COMMUNITY): Payer: Self-pay | Admitting: Anesthesiology

## 2012-02-09 ENCOUNTER — Ambulatory Visit (HOSPITAL_COMMUNITY): Payer: Medicare Other | Admitting: Anesthesiology

## 2012-02-09 ENCOUNTER — Encounter (HOSPITAL_COMMUNITY): Payer: Self-pay | Admitting: *Deleted

## 2012-02-09 ENCOUNTER — Inpatient Hospital Stay (HOSPITAL_COMMUNITY)
Admission: RE | Admit: 2012-02-09 | Discharge: 2012-02-12 | DRG: 328 | Disposition: A | Discharge status: Home / Self Care | Payer: Medicare Other | Source: Ambulatory Visit | Attending: Surgery | Admitting: Surgery

## 2012-02-09 ENCOUNTER — Encounter (HOSPITAL_COMMUNITY): Payer: Self-pay | Admitting: Surgery

## 2012-02-09 ENCOUNTER — Telehealth (INDEPENDENT_AMBULATORY_CARE_PROVIDER_SITE_OTHER): Payer: Self-pay | Admitting: General Surgery

## 2012-02-09 ENCOUNTER — Encounter (HOSPITAL_COMMUNITY)
Admission: RE | Disposition: A | Discharge status: Home / Self Care | Payer: Self-pay | Source: Ambulatory Visit | Attending: Surgery

## 2012-02-09 DIAGNOSIS — K219 Gastro-esophageal reflux disease without esophagitis: Secondary | ICD-10-CM | POA: Diagnosis present

## 2012-02-09 DIAGNOSIS — Z87442 Personal history of urinary calculi: Secondary | ICD-10-CM

## 2012-02-09 DIAGNOSIS — I2510 Atherosclerotic heart disease of native coronary artery without angina pectoris: Secondary | ICD-10-CM | POA: Diagnosis present

## 2012-02-09 DIAGNOSIS — Z8701 Personal history of pneumonia (recurrent): Secondary | ICD-10-CM

## 2012-02-09 DIAGNOSIS — N6089 Other benign mammary dysplasias of unspecified breast: Secondary | ICD-10-CM | POA: Diagnosis present

## 2012-02-09 DIAGNOSIS — I252 Old myocardial infarction: Secondary | ICD-10-CM

## 2012-02-09 DIAGNOSIS — N3010 Interstitial cystitis (chronic) without hematuria: Secondary | ICD-10-CM | POA: Diagnosis present

## 2012-02-09 DIAGNOSIS — Z01812 Encounter for preprocedural laboratory examination: Secondary | ICD-10-CM

## 2012-02-09 DIAGNOSIS — K449 Diaphragmatic hernia without obstruction or gangrene: Secondary | ICD-10-CM

## 2012-02-09 DIAGNOSIS — K2100 Gastro-esophageal reflux disease with esophagitis, without bleeding: Secondary | ICD-10-CM

## 2012-02-09 DIAGNOSIS — D649 Anemia, unspecified: Secondary | ICD-10-CM | POA: Diagnosis present

## 2012-02-09 DIAGNOSIS — Z23 Encounter for immunization: Secondary | ICD-10-CM

## 2012-02-09 DIAGNOSIS — Z9089 Acquired absence of other organs: Secondary | ICD-10-CM

## 2012-02-09 DIAGNOSIS — Z90710 Acquired absence of both cervix and uterus: Secondary | ICD-10-CM

## 2012-02-09 DIAGNOSIS — Z79899 Other long term (current) drug therapy: Secondary | ICD-10-CM

## 2012-02-09 DIAGNOSIS — C50919 Malignant neoplasm of unspecified site of unspecified female breast: Secondary | ICD-10-CM | POA: Diagnosis present

## 2012-02-09 HISTORY — PX: LAPAROSCOPIC NISSEN FUNDOPLICATION: SHX1932

## 2012-02-09 HISTORY — PX: INSERTION OF MESH: SHX5868

## 2012-02-09 HISTORY — PX: HIATAL HERNIA REPAIR: SHX195

## 2012-02-09 LAB — CBC
HCT: 32.5 % — ABNORMAL LOW (ref 36.0–46.0)
MCH: 26.6 pg (ref 26.0–34.0)
MCV: 77.2 fL — ABNORMAL LOW (ref 78.0–100.0)
Platelets: 129 10*3/uL — ABNORMAL LOW (ref 150–400)
RBC: 4.21 MIL/uL (ref 3.87–5.11)
WBC: 11.2 10*3/uL — ABNORMAL HIGH (ref 4.0–10.5)

## 2012-02-09 LAB — CREATININE, SERUM: GFR calc Af Amer: >90 mL/min (ref >90)

## 2012-02-09 SURGERY — FUNDOPLICATION, NISSEN, LAPAROSCOPIC
Anesthesia: General | Site: Esophagus | Wound class: Clean Contaminated

## 2012-02-09 MED ORDER — OXYCODONE HCL 5 MG/5ML PO SOLN
5.0000 mg | Freq: Once | ORAL | Status: DC | PRN
  Filled 2012-02-09: qty 5

## 2012-02-09 MED ORDER — BUPIVACAINE LIPOSOME 1.3 % IJ SUSP
20.0000 mL | Freq: Once | INTRAMUSCULAR | Status: DC
  Filled 2012-02-09: qty 20

## 2012-02-09 MED ORDER — KCL IN DEXTROSE-NACL 20-5-0.45 MEQ/L-%-% IV SOLN
INTRAVENOUS | Status: DC
  Administered 2012-02-09 – 2012-02-12 (×7): via INTRAVENOUS
  Filled 2012-02-09 (×9): qty 1000

## 2012-02-09 MED ORDER — INFLUENZA VIRUS VACC SPLIT PF IM SUSP
0.5000 mL | INTRAMUSCULAR | Status: AC | PRN
  Administered 2012-02-12: 0.5 mL via INTRAMUSCULAR
  Filled 2012-02-09: qty 0.5

## 2012-02-09 MED ORDER — MEPERIDINE HCL 50 MG/ML IJ SOLN: 6.2500 mg | INTRAMUSCULAR | Status: DC | PRN

## 2012-02-09 MED ORDER — MORPHINE SULFATE 2 MG/ML IJ SOLN
1.0000 mg | INTRAMUSCULAR | Status: DC | PRN
  Administered 2012-02-09 – 2012-02-11 (×11): 1 mg via INTRAVENOUS
  Filled 2012-02-09 (×11): qty 1

## 2012-02-09 MED ORDER — DIPHENHYDRAMINE HCL 50 MG/ML IJ SOLN: 12.5000 mg | Freq: Four times a day (QID) | INTRAMUSCULAR | Status: DC | PRN

## 2012-02-09 MED ORDER — DEXTROSE 5 % IV SOLN
2.0000 g | INTRAVENOUS | Status: AC
  Administered 2012-02-09: 2 g via INTRAVENOUS

## 2012-02-09 MED ORDER — EPHEDRINE SULFATE 50 MG/ML IJ SOLN
INTRAMUSCULAR | Status: DC | PRN
  Administered 2012-02-09: 7.5 mg via INTRAVENOUS

## 2012-02-09 MED ORDER — GLYCOPYRROLATE 0.2 MG/ML IJ SOLN
INTRAMUSCULAR | Status: DC | PRN
  Administered 2012-02-09: 0.6 mg via INTRAVENOUS

## 2012-02-09 MED ORDER — DIPHENHYDRAMINE HCL 12.5 MG/5ML PO ELIX: 12.5000 mg | ORAL_SOLUTION | Freq: Four times a day (QID) | ORAL | Status: DC | PRN

## 2012-02-09 MED ORDER — LACTATED RINGERS IV SOLN
INTRAVENOUS | Status: DC | PRN
  Administered 2012-02-09: 1000 mL

## 2012-02-09 MED ORDER — OXYCODONE HCL 5 MG PO TABS: 5.0000 mg | ORAL_TABLET | Freq: Once | ORAL | Status: DC | PRN

## 2012-02-09 MED ORDER — KCL IN DEXTROSE-NACL 20-5-0.45 MEQ/L-%-% IV SOLN
INTRAVENOUS | Status: AC
  Filled 2012-02-09: qty 1000

## 2012-02-09 MED ORDER — ACETAMINOPHEN 10 MG/ML IV SOLN
INTRAVENOUS | Status: DC | PRN
  Administered 2012-02-09: 1000 mg via INTRAVENOUS

## 2012-02-09 MED ORDER — LACTATED RINGERS IV SOLN
INTRAVENOUS | Status: DC | PRN
  Administered 2012-02-09 (×3): via INTRAVENOUS

## 2012-02-09 MED ORDER — PROMETHAZINE HCL 25 MG/ML IJ SOLN
6.2500 mg | INTRAMUSCULAR | Status: DC | PRN
  Administered 2012-02-09: 6.25 mg via INTRAVENOUS

## 2012-02-09 MED ORDER — FENTANYL CITRATE 0.05 MG/ML IJ SOLN
INTRAMUSCULAR | Status: DC | PRN
  Administered 2012-02-09: 100 ug via INTRAVENOUS
  Administered 2012-02-09 (×2): 50 ug via INTRAVENOUS

## 2012-02-09 MED ORDER — NEOSTIGMINE METHYLSULFATE 1 MG/ML IJ SOLN
INTRAMUSCULAR | Status: DC | PRN
  Administered 2012-02-09: 4 mg via INTRAVENOUS

## 2012-02-09 MED ORDER — ACETAMINOPHEN 10 MG/ML IV SOLN
INTRAVENOUS | Status: AC
  Filled 2012-02-09: qty 100

## 2012-02-09 MED ORDER — SUCCINYLCHOLINE CHLORIDE 20 MG/ML IJ SOLN
INTRAMUSCULAR | Status: DC | PRN
  Administered 2012-02-09: 100 mg via INTRAVENOUS

## 2012-02-09 MED ORDER — HEPARIN SODIUM (PORCINE) 5000 UNIT/ML IJ SOLN
5000.0000 [IU] | Freq: Three times a day (TID) | INTRAMUSCULAR | Status: DC
  Administered 2012-02-09 – 2012-02-12 (×10): 5000 [IU] via SUBCUTANEOUS
  Filled 2012-02-09 (×12): qty 1

## 2012-02-09 MED ORDER — CISATRACURIUM BESYLATE (PF) 10 MG/5ML IV SOLN
INTRAVENOUS | Status: DC | PRN
  Administered 2012-02-09 (×2): 3 mg via INTRAVENOUS
  Administered 2012-02-09: 4 mg via INTRAVENOUS
  Administered 2012-02-09: 2 mg via INTRAVENOUS
  Administered 2012-02-09: 4 mg via INTRAVENOUS
  Administered 2012-02-09: 6 mg via INTRAVENOUS

## 2012-02-09 MED ORDER — PROMETHAZINE HCL 25 MG/ML IJ SOLN
INTRAMUSCULAR | Status: AC
  Filled 2012-02-09: qty 1

## 2012-02-09 MED ORDER — HYDROMORPHONE HCL PF 1 MG/ML IJ SOLN
0.2500 mg | INTRAMUSCULAR | Status: DC | PRN
  Administered 2012-02-09 (×2): 0.5 mg via INTRAVENOUS

## 2012-02-09 MED ORDER — CEFOXITIN SODIUM-DEXTROSE 1-4 GM-% IV SOLR (PREMIX)
INTRAVENOUS | Status: AC
  Filled 2012-02-09: qty 100

## 2012-02-09 MED ORDER — ONDANSETRON HCL 4 MG/2ML IJ SOLN
INTRAMUSCULAR | Status: DC | PRN
  Administered 2012-02-09: 4 mg via INTRAVENOUS

## 2012-02-09 MED ORDER — HYDROMORPHONE HCL PF 1 MG/ML IJ SOLN
INTRAMUSCULAR | Status: AC
  Filled 2012-02-09: qty 1

## 2012-02-09 MED ORDER — BUPIVACAINE LIPOSOME 1.3 % IJ SUSP
INTRAMUSCULAR | Status: DC | PRN
  Administered 2012-02-09: 20 mL

## 2012-02-09 MED ORDER — ACETAMINOPHEN 10 MG/ML IV SOLN: 1000.0000 mg | Freq: Once | INTRAVENOUS | Status: DC | PRN

## 2012-02-09 MED ORDER — HEPARIN SODIUM (PORCINE) 5000 UNIT/ML IJ SOLN
5000.0000 [IU] | Freq: Once | INTRAMUSCULAR | Status: AC
  Administered 2012-02-09: 5000 [IU] via SUBCUTANEOUS
  Filled 2012-02-09: qty 1

## 2012-02-09 MED ORDER — LIP MEDEX EX OINT
TOPICAL_OINTMENT | CUTANEOUS | Status: AC
  Filled 2012-02-09: qty 7

## 2012-02-09 MED ORDER — ONDANSETRON HCL 4 MG/2ML IJ SOLN
4.0000 mg | Freq: Four times a day (QID) | INTRAMUSCULAR | Status: DC | PRN
  Administered 2012-02-09 – 2012-02-12 (×11): 4 mg via INTRAVENOUS
  Filled 2012-02-09 (×12): qty 2

## 2012-02-09 MED ORDER — PANTOPRAZOLE SODIUM 40 MG IV SOLR
40.0000 mg | Freq: Every day | INTRAVENOUS | Status: DC
  Administered 2012-02-09 – 2012-02-11 (×3): 40 mg via INTRAVENOUS
  Filled 2012-02-09 (×4): qty 40

## 2012-02-09 MED ORDER — PROPOFOL 10 MG/ML IV BOLUS
INTRAVENOUS | Status: DC | PRN
  Administered 2012-02-09: 150 mg via INTRAVENOUS

## 2012-02-09 SURGICAL SUPPLY — 61 items
APL SKNCLS STERI-STRIP NONHPOA (GAUZE/BANDAGES/DRESSINGS) ×2
APPLIER CLIP ROT 10 11.4 M/L (STAPLE)
APR CLP MED LRG 11.4X10 (STAPLE)
BENZOIN TINCTURE PRP APPL 2/3 (GAUZE/BANDAGES/DRESSINGS) ×3 IMPLANT
CABLE HIGH FREQUENCY MONO STRZ (ELECTRODE) IMPLANT
CANISTER SUCTION 2500CC (MISCELLANEOUS) ×1 IMPLANT
CLAMP ENDO BABCK 10MM (STAPLE) IMPLANT
CLIP APPLIE ROT 10 11.4 M/L (STAPLE) IMPLANT
CLOTH BEACON ORANGE TIMEOUT ST (SAFETY) ×3 IMPLANT
COVER SURGICAL LIGHT HANDLE (MISCELLANEOUS) ×2 IMPLANT
DECANTER SPIKE VIAL GLASS SM (MISCELLANEOUS) ×3 IMPLANT
DEVICE SUT QUICK LOAD TK 5 (STAPLE) ×11 IMPLANT
DEVICE SUT TI-KNOT TK 5X26 (MISCELLANEOUS) ×1 IMPLANT
DEVICE SUTURE ENDOST 10MM (ENDOMECHANICALS) ×3 IMPLANT
DISSECTOR BLUNT TIP ENDO 5MM (MISCELLANEOUS) ×3 IMPLANT
DRAIN PENROSE 18X1/2 LTX STRL (DRAIN) ×3 IMPLANT
DRAPE LAPAROSCOPIC ABDOMINAL (DRAPES) ×3 IMPLANT
ELECT REM PT RETURN 9FT ADLT (ELECTROSURGICAL) ×3
ELECTRODE REM PT RTRN 9FT ADLT (ELECTROSURGICAL) ×2 IMPLANT
FELT TEFLON 4 X1 (Mesh General) ×3 IMPLANT
FILTER SMOKE EVAC LAPAROSHD (FILTER) IMPLANT
GLOVE BIOGEL M 8.0 STRL (GLOVE) ×4 IMPLANT
GLOVE BIOGEL PI IND STRL 7.0 (GLOVE) IMPLANT
GLOVE BIOGEL PI INDICATOR 7.0 (GLOVE) ×4
GOWN STRL NON-REIN LRG LVL3 (GOWN DISPOSABLE) ×2 IMPLANT
GOWN STRL REIN XL XLG (GOWN DISPOSABLE) ×9 IMPLANT
GRASPER ENDO BABCOCK 10 (MISCELLANEOUS) IMPLANT
GRASPER ENDO BABCOCK 10MM (MISCELLANEOUS)
HAND ACTIVATED (MISCELLANEOUS) ×3 IMPLANT
KIT BASIN OR (CUSTOM PROCEDURE TRAY) ×3 IMPLANT
MESH SURGISIS HERNIA ×1 IMPLANT
NS IRRIG 1000ML POUR BTL (IV SOLUTION) ×3 IMPLANT
PENCIL BUTTON HOLSTER BLD 10FT (ELECTRODE) IMPLANT
SCISSORS LAP 5X35 DISP (ENDOMECHANICALS) ×3 IMPLANT
SET IRRIG TUBING LAPAROSCOPIC (IRRIGATION / IRRIGATOR) ×3 IMPLANT
SLEEVE ADV FIXATION 5X100MM (TROCAR) IMPLANT
SLEEVE Z-THREAD 5X100MM (TROCAR) IMPLANT
SOLUTION ANTI FOG 6CC (MISCELLANEOUS) ×3 IMPLANT
STAPLER VISISTAT 35W (STAPLE) ×3 IMPLANT
STRIP CLOSURE SKIN 1/2X4 (GAUZE/BANDAGES/DRESSINGS) IMPLANT
SUT ETHIBOND 2 0 SH (SUTURE) ×15
SUT ETHIBOND 2 0 SH 36X2 (SUTURE) IMPLANT
SUT SURGIDAC NAB ES-9 0 48 120 (SUTURE) ×15 IMPLANT
SUT VIC AB 4-0 SH 18 (SUTURE) ×3 IMPLANT
SYR 30ML LL (SYRINGE) ×4 IMPLANT
TIP INNERVISION DETACH 40FR (MISCELLANEOUS) IMPLANT
TIP INNERVISION DETACH 50FR (MISCELLANEOUS) IMPLANT
TIP INNERVISION DETACH 56FR (MISCELLANEOUS) ×1 IMPLANT
TIPS INNERVISION DETACH 40FR (MISCELLANEOUS)
TRAY FOLEY CATH 14FRSI W/METER (CATHETERS) ×3 IMPLANT
TRAY LAP CHOLE (CUSTOM PROCEDURE TRAY) ×3 IMPLANT
TROCAR ADV FIXATION 11X100MM (TROCAR) IMPLANT
TROCAR ADV FIXATION 5X100MM (TROCAR) IMPLANT
TROCAR BLADELESS OPT 5 75 (ENDOMECHANICALS) ×3 IMPLANT
TROCAR XCEL BLUNT TIP 100MML (ENDOMECHANICALS) IMPLANT
TROCAR XCEL NON-BLD 11X100MML (ENDOMECHANICALS) IMPLANT
TROCAR Z-THREAD FIOS 11X100 BL (TROCAR) ×3 IMPLANT
TROCAR Z-THREAD FIOS 5X100MM (TROCAR) ×3 IMPLANT
TROCAR Z-THREAD SLEEVE 11X100 (TROCAR) IMPLANT
TUBING FILTER THERMOFLATOR (ELECTROSURGICAL) ×3 IMPLANT
WATER STERILE IRR 500ML POUR (IV SOLUTION) ×1 IMPLANT

## 2012-02-09 NOTE — Interval H&P Note (Signed)
History and Physical Interval Note:  02/09/2012 7:31 AM  Stephanie Bell  has presented today for surgery, with the diagnosis of hernintion of stomach   The various methods of treatment have been discussed with the patient and family. After consideration of risks, benefits and other options for treatment, the patient has consented to  Procedure(s) (LRB) with comments: LAPAROSCOPIC NISSEN FUNDOPLICATION (N/A) - Re-do Laparoscopic Nissen  LAPAROSCOPIC REPAIR OF HIATAL HERNIA (N/A) as a surgical intervention .  The patient's history has been reviewed, patient examined, no change in status, stable for surgery.  I have reviewed the patient's chart and labs.  Questions were answered to the patient's satisfaction.     Risa Auman B

## 2012-02-09 NOTE — Anesthesia Preprocedure Evaluation (Addendum)
Anesthesia Evaluation  Patient identified by MRN, date of birth, ID band Patient awake    Reviewed: Allergy & Precautions, H&P , NPO status , Patient's Chart, lab work & pertinent test results, reviewed documented beta blocker date and time   Airway Mallampati: II TM Distance: >3 FB Neck ROM: Full    Dental  (+) Dental Advisory Given, Teeth Intact and Caps   Pulmonary neg pulmonary ROS, pneumonia -, resolved,  breath sounds clear to auscultation  Pulmonary exam normal       Cardiovascular - CAD and - Past MI negative cardio ROS  Rhythm:Regular Rate:Normal  Denies cardiac symptoms   Neuro/Psych negative neurological ROS  negative psych ROS   GI/Hepatic Neg liver ROS, hiatal hernia, GERD-  ,  Endo/Other  negative endocrine ROS  Renal/GU negative Renal ROS  negative genitourinary   Musculoskeletal negative musculoskeletal ROS (+)   Abdominal   Peds negative pediatric ROS (+)  Hematology negative hematology ROS (+) Anemia Hgb 10.8   Anesthesia Other Findings Upper front caps  Reproductive/Obstetrics negative OB ROS                          Anesthesia Physical Anesthesia Plan  ASA: II  Anesthesia Plan: General   Post-op Pain Management:    Induction: Intravenous and Rapid sequence  Airway Management Planned: Oral ETT  Additional Equipment:   Intra-op Plan:   Post-operative Plan: Extubation in OR  Informed Consent: I have reviewed the patients History and Physical, chart, labs and discussed the procedure including the risks, benefits and alternatives for the proposed anesthesia with the patient or authorized representative who has indicated his/her understanding and acceptance.   Dental advisory given  Plan Discussed with: CRNA  Anesthesia Plan Comments:         Anesthesia Quick Evaluation                                   Anesthesia Evaluation  Patient identified by MRN,  date of birth, ID band Patient awake    Reviewed: Allergy & Precautions, H&P , NPO status , Patient's Chart, lab work & pertinent test results, reviewed documented beta blocker date and time   Airway Mallampati: II TM Distance: >3 FB Neck ROM: Full    Dental  (+) Dental Advisory Given and Teeth Intact   Pulmonary neg pulmonary ROS,          Cardiovascular neg cardio ROS Regular Normal Denies cardiac symptoms   Neuro/Psych Negative Neurological ROS  Negative Psych ROS   GI/Hepatic Neg liver ROS, hiatal hernia,   Endo/Other  Negative Endocrine ROS  Renal/GU negative Renal ROS  Genitourinary negative   Musculoskeletal negative musculoskeletal ROS (+)   Abdominal   Peds negative pediatric ROS (+)  Hematology Anemia Hgb 10.8   Anesthesia Other Findings Upper front caps  Reproductive/Obstetrics negative OB ROS                           Anesthesia Physical Anesthesia Plan  ASA: II  Anesthesia Plan: General   Post-op Pain Management:    Induction: Intravenous and Cricoid pressure planned  Airway Management Planned: Oral ETT  Additional Equipment:   Intra-op Plan:   Post-operative Plan: Extubation in OR  Informed Consent: I have reviewed the patients History and Physical, chart, labs and discussed the procedure including the risks, benefits  and alternatives for the proposed anesthesia with the patient or authorized representative who has indicated his/her understanding and acceptance.   Dental advisory given  Plan Discussed with: CRNA and Surgeon  Anesthesia Plan Comments:         Anesthesia Quick Evaluation

## 2012-02-09 NOTE — Transfer of Care (Signed)
Immediate Anesthesia Transfer of Care Note  Patient: Stephanie Bell  Procedure(s) Performed: Procedure(s) (LRB) with comments: LAPAROSCOPIC NISSEN FUNDOPLICATION (N/A) - Re-do Laparoscopic Nissen  LAPAROSCOPIC REPAIR OF HIATAL HERNIA (N/A) INSERTION OF MESH ()  Patient Location: PACU  Anesthesia Type:General  Level of Consciousness: awake, sedated and patient cooperative  Airway & Oxygen Therapy: Patient Spontanous Breathing and Patient connected to face mask oxygen  Post-op Assessment: Report given to PACU RN and Post -op Vital signs reviewed and stable  Post vital signs: Reviewed and stable  Complications: No apparent anesthesia complications

## 2012-02-09 NOTE — Anesthesia Postprocedure Evaluation (Signed)
Anesthesia Post Note  Patient: Stephanie Bell  Procedure(s) Performed: Procedure(s) (LRB): LAPAROSCOPIC NISSEN FUNDOPLICATION (N/A) LAPAROSCOPIC REPAIR OF HIATAL HERNIA (N/A) INSERTION OF MESH ()  Anesthesia type: General  Patient location: PACU  Post pain: Pain level controlled  Post assessment: Post-op Vital signs reviewed  Last Vitals: BP 160/83  Pulse 72  Temp 36.3 C  Resp 12  SpO2 99%  Post vital signs: Reviewed  Level of consciousness: sedated  Complications: No apparent anesthesia complications

## 2012-02-09 NOTE — Progress Notes (Signed)
Utilization review completed.  

## 2012-02-09 NOTE — Telephone Encounter (Signed)
LMOm letting pt know that her first PO will be on 12/12 at 12:10.

## 2012-02-09 NOTE — H&P (Signed)
Chief Complaint:  Recurrent GERD   History of Present Illness:  Stephanie Bell is an 71 y.o. female has a repair of a large type III hiatus hernia on 05/16/11.  She had the following: Laparoscopic take down and repair of hiatus hernia (type III with a third of stomach in chest), Nissen fundoplication over a #56 bougie, repair of umbilical hernia with a Bard medium Ventralex patch, excision of sebaceous cyst from between breasts UGI on 11/27/11 showed:  1. Small to moderate sized hiatal hernia in patient with  fundoplication. No obstruction to the passage of barium into the  stomach. A barium pill passes in the stomach without delay.  2. Moderate gastroesophageal reflux.  3. Mild tertiary contractions in the mid and distal esophagus.  4. Prominent cricopharyngeus muscle.  Her symptoms have partially returned and they are not satisfactorily controlled with PPIs.  Will return to OR for repair.   Past Medical History  Diagnosis Date  . Interstitial cystitis   . Hx: UTI (urinary tract infection)   . Weight loss   . Wound disruption, post-op, skin   . Pneumonia 05-09-11    2011 last time  . Paraesophageal hernia   . GERD (gastroesophageal reflux disease)   . H/O hiatal hernia     Past Surgical History  Procedure Date  . Vaginal hysterectomy   . Septoplasty   . Back surgery 05-09-11    x 2 lumbar  . Cholecystectomy 05-09-11    laparoscopic  . Tonsillectomy 05-09-11    Tonsillectomy  . Kidney stone surgery 05-09-11    past hx. and cystoscopy for bladder issues  . Laparoscopic nissen fundoplication 05/16/2011    Procedure: LAPAROSCOPIC NISSEN FUNDOPLICATION;  Surgeon: Valarie Merino, MD;  Location: [facility];  Service: General;  Laterality: N/A;  will also remove sebaceous cyst of chest wall  . Irrigation and debridement sebaceous cyst 05/16/2011    Procedure: IRRIGATION AND DEBRIDEMENT SEBACEOUS CYST;  Surgeon: Valarie Merino, MD;  Location: [facility];  Service: General;  Laterality: N/A;   middle of chest on sternum  . Umbilical hernia repair 05/16/2011    Procedure: HERNIA REPAIR UMBILICAL ADULT;  Surgeon: Valarie Merino, MD;  Location: [facility];  Service: General;  Laterality: N/A;  with mesh   . Hernia repair 05/16/2011    hiatal hernia  . Colonoscopy     Current Facility-Administered Medications  Medication Dose Route Frequency Provider Last Rate Last Dose  . cefOXitin (MEFOXIN) 2 g in dextrose 5 % 50 mL IVPB  2 g Intravenous 60 min Pre-Op Valarie Merino, MD      . [address] heparin injection 5,000 Units  5,000 Units Subcutaneous Once Valarie Merino, MD   5,000 Units at 02/09/12 0617   Sulfa antibiotics Family History  Problem Relation Age of Onset  . Colon cancer Neg Hx   . Ulcers Paternal Grandmother   . Ulcers Father   . Alcohol abuse Father    Social History:   reports that she has never smoked. She has never used smokeless tobacco. She reports that she does not drink alcohol or use illicit drugs.   REVIEW OF SYSTEMS - PERTINENT POSITIVES ONLY: Non contributory  Physical Exam:   Blood pressure 119/70, pulse 76, temperature 97.7 F (36.5 C), resp. rate 20, SpO2 100.00%. There is no height or weight on file to calculate BMI.  Gen:  WDWN WF NAD  Neurological: Alert and oriented to person, place, and time. Motor and sensory function is  grossly intact  Head: Normocephalic and atraumatic.  Eyes: Conjunctivae are normal. Pupils are equal, round, and reactive to light. No scleral icterus.  Neck: Normal range of motion. Neck supple. No tracheal deviation or thyromegaly present.  Cardiovascular:  SR without murmurs or gallops.  No carotid bruits Respiratory: Effort normal.  No respiratory distress. No chest wall tenderness. Breath sounds normal.  No wheezes, rales or rhonchi.  Abdomen:  nontender GU: Musculoskeletal: Normal range of motion. Extremities are nontender. No cyanosis, edema or clubbing noted Lymphadenopathy: No cervical, preauricular,  postauricular or axillary adenopathy is present Skin: Skin is warm and dry. No rash noted. No diaphoresis. No erythema. No pallor. Pscyh: Normal mood and affect. Behavior is normal. Judgment and thought content normal.   LABORATORY RESULTS: No results found for this or any previous visit (from the past 48 hour(s)).  RADIOLOGY RESULTS: No results found.  Problem List: Patient Active Problem List  Diagnosis  . Paraesophageal hernia  . Hiatal hernia  . S/P Nissen fundoplication (without gastrostomy tube) procedure  . Breast cancer  . Sebaceous cyst of breast    Assessment & Plan: Partial return of symptoms after lap Nissen.  Plan return to OR to repair    [name], MD, [facility] [phone] beeper [phone]  02/09/2012 6:44 AM

## 2012-02-09 NOTE — Care Management Note (Signed)
    Page 1 of 1   02/09/2012     2:21:09 PM   CARE MANAGEMENT NOTE 02/09/2012  Patient:  Stephanie Bell, Stephanie Bell   Account Number:  [id number]  Date Initiated:  02/09/2012  Documentation initiated by:  Lorenda Ishihara  Subjective/Objective Assessment:   71 yo female admitted s/p Laparoscopic Nissen Fundoplication. PTA lived at home with spouse.     Action/Plan:   home when stable   Anticipated DC Date:  02/12/2012   Anticipated DC Plan:  HOME/SELF CARE      DC Planning Services  CM consult      Choice offered to / List presented to:             Status of service:  Completed, signed off Medicare Important Message given?  NO (If response is "NO", the following Medicare IM given date fields will be blank) Date Medicare IM given:   Date Additional Medicare IM given:    Discharge Disposition:  HOME/SELF CARE  Per UR Regulation:  Reviewed for med. necessity/level of care/duration of stay  If discussed at Long Length of Stay Meetings, dates discussed:    Comments:

## 2012-02-09 NOTE — Brief Op Note (Signed)
02/09/2012  11:45 AM  PATIENT:  Stephanie Bell  71 y.o. female  PRE-OPERATIVE DIAGNOSIS:  hernintion of stomach   POST-OPERATIVE DIAGNOSIS:  hernintion of stomach  PROCEDURE:  Procedure(s) (LRB) with comments: LAPAROSCOPIC NISSEN FUNDOPLICATION (N/A) - Re-do Laparoscopic Nissen  LAPAROSCOPIC REPAIR OF HIATAL HERNIA (N/A) INSERTION OF MESH ()  SURGEON:  Surgeon(s) and Role:    * Valarie Merino, MD - Primary    * Kandis Cocking, MD - Assisting  PHYSICIAN ASSISTANT:   ASSISTANTS: Ovidio Kin, MD, FACS   ANESTHESIA:   general  EBL:  Total I/O In: 3400 [I.V.:3400] Out: 500 [Urine:500]  BLOOD ADMINISTERED:none  DRAINS: none   LOCAL MEDICATIONS USED:  MARCAINE     SPECIMEN:  No Specimen  DISPOSITION OF SPECIMEN:  N/A  COUNTS:  YES  TOURNIQUET:  * No tourniquets in log *  DICTATION: .Other Dictation: Dictation Number [id number]  PLAN OF CARE: Admit to inpatient   PATIENT DISPOSITION:  PACU - hemodynamically stable.   Delay start of Pharmacological VTE agent (>24hrs) due to surgical blood loss or risk of bleeding: no

## 2012-02-10 ENCOUNTER — Inpatient Hospital Stay (HOSPITAL_COMMUNITY): Payer: Medicare Other

## 2012-02-10 LAB — CBC
HCT: 30.1 % — ABNORMAL LOW (ref 36.0–46.0)
Hemoglobin: 10.4 g/dL — ABNORMAL LOW (ref 12.0–15.0)
MCV: 77.8 fL — ABNORMAL LOW (ref 78.0–100.0)
RBC: 3.87 MIL/uL (ref 3.87–5.11)
RDW: 14.9 % (ref 11.5–15.5)
WBC: 8.2 10*3/uL (ref 4.0–10.5)

## 2012-02-10 MED ORDER — PHENOL 1.4 % MT LIQD
1.0000 | OROMUCOSAL | Status: DC | PRN
  Filled 2012-02-10: qty 177

## 2012-02-10 MED ORDER — IOHEXOL 300 MG/ML  SOLN: 50.0000 mL | Freq: Once | INTRAMUSCULAR | Status: AC | PRN

## 2012-02-10 NOTE — Progress Notes (Signed)
Patient ID: Stephanie Bell, female   DOB: 28-Dec-1940, 71 y.o.   MRN: [id number] [facility] Progress Note:   1 Day Post-Op  Subjective: Mental status is clear Objective: Vital signs in last 24 hours: Temp:  [97.4 F (36.3 C)-100.1 F (37.8 C)] 98.5 F (36.9 C) (11/23 0950) Pulse Rate:  [67-89] 86  (11/23 0950) Resp:  [12-18] 18  (11/23 0950) BP: (114-180)/(67-88) 138/78 mmHg (11/23 0950) SpO2:  [94 %-100 %] 95 % (11/23 0950) Weight:  [156 lb (70.761 kg)] 156 lb (70.761 kg) (11/22 1330)  Intake/Output from previous day: 11/22 0701 - 11/23 0700 In: 5513.3 [I.V.:5513.3] Out: 1775 [Urine:1775] Intake/Output this shift:    Physical Exam: Work of breathing is normal.  UGI looks ok.  Will offer clears  Lab Results:  Results for orders placed during the hospital encounter of 02/09/12 (from the past 48 hour(s))  CBC     Status: Abnormal   Collection Time   02/09/12 12:40 PM      Component Value Range Comment   WBC 11.2 (*) 4.0 - 10.5 K/uL    RBC 4.21  3.87 - 5.11 MIL/uL    Hemoglobin 11.2 (*) 12.0 - 15.0 g/dL    HCT 40.9 (*) 81.1 - 46.0 %    MCV 77.2 (*) 78.0 - 100.0 fL    MCH 26.6  26.0 - 34.0 pg    MCHC 34.5  30.0 - 36.0 g/dL    RDW 91.4  78.2 - 95.6 %    Platelets 129 (*) 150 - 400 K/uL   CREATININE, SERUM     Status: Abnormal   Collection Time   02/09/12 12:40 PM      Component Value Range Comment   Creatinine, Ser 0.63  0.50 - 1.10 mg/dL    GFR calc non Af Amer 87 (*) >90 mL/min    GFR calc Af Amer >90  >90 mL/min   CBC     Status: Abnormal   Collection Time   02/10/12  4:35 AM      Component Value Range Comment   WBC 8.2  4.0 - 10.5 K/uL    RBC 3.87  3.87 - 5.11 MIL/uL    Hemoglobin 10.4 (*) 12.0 - 15.0 g/dL    HCT 21.3 (*) 08.6 - 46.0 %    MCV 77.8 (*) 78.0 - 100.0 fL    MCH 26.9  26.0 - 34.0 pg    MCHC 34.6  30.0 - 36.0 g/dL    RDW 57.8  46.9 - 62.9 %    Platelets SPECIMEN CHECKED FOR CLOTS  150 - 400 K/uL PLATELET CLUMPS NOTED ON SMEAR, COUNT  APPEARS ADEQUATE    Radiology/Results: Dg Ugi W/water Sol Cm  02/10/2012  *RADIOLOGY REPORT*  Clinical Data:  Status post redo Nissen fundoplication yesterday.  UPPER GI SERIES WITHOUT KUB  Technique:  Routine upper GI series was performed with  50 ml water- soluble contrast ( on the 300).  Fluoroscopy Time: 2.1 minutes  Comparison:  11/28/2011  Findings: The patient was only able to tolerate individual swallows, secondary to nausea.  Suspicion of small volume free intraperitoneal air, presumably postoperative.  No obstruction or contrast extravasation identified.  Normal proximal small bowel caliber.  Cholecystectomy clips.  IMPRESSION: No evidence of obstruction or contrast extravasation to suggest leak.  Exam of slightly decreased sensitivity, secondary the patient's ability to only take small swallows.   Original Report Authenticated By: Jeronimo Greaves, M.D.     Anti-infectives: Anti-infectives  Start     Dose/Rate Route Frequency Ordered Stop   02/09/12 0601   cefOXitin (MEFOXIN) 2 g in dextrose 5 % 50 mL IVPB        2 g 100 mL/hr over 30 Minutes Intravenous 60 min pre-op 02/09/12 0601 02/09/12 0750          Assessment/Plan: Problem List: Patient Active Problem List  Diagnosis  . Paraesophageal hernia  . Hiatal hernia  . S/P Nissen fundoplication (without gastrostomy tube) procedure  . Breast cancer  . Sebaceous cyst of breast    Swallow OK.  Will start clears PO 1 Day Post-Op    LOS: 1 day   [name], MD, [facility] [phone] beeper [phone]  02/10/2012 10:22 AM

## 2012-02-10 NOTE — Op Note (Signed)
NAMETORIANN, SPADONI NO.:  [id number]  MEDICAL RECORD NO.:  [id number]  LOCATION:  1522                         FACILITY:  [facility]  PHYSICIAN:  Thornton Park. Daphine Deutscher, MD  DATE OF BIRTH:  [birth year]  DATE OF PROCEDURE:  02/09/2012 DATE OF DISCHARGE:                              OPERATIVE REPORT   PREOPERATIVE DIAGNOSIS:  Recurrent symptoms of gastroesophageal reflux disease after prior repair of a hiatal hernia and Nissen done February. Symptoms recurred after a severe upper respiratory infection with a lot of coughing.  Swallow showed a small prolapse of gastric mucosa above the wrap and the chest.  SURGEON:  Thornton Park. Daphine Deutscher, MD  ASSISTANT:  Sandria Bales. Ezzard Standing, MD  ANESTHESIA:  General endotracheal.  PROCEDURE:  Laparoscopy with dissection of the foregut and takedown of prior Nissen, upper endoscopy to help identify the anatomy, closure of the hiatus with calibration to a 56 dilator.  Rewrapping with a 2 suture wrap taken from the prior wrap and closed over the 56 dilator.  DESCRIPTION OF PROCEDURE:  The patient was taken to room 1, given general anesthesia.  The abdomen was prepped with PCMX and draped sterilely.  I attempted to access the abdomen initially through the left upper quadrant through an old incision in the midline, but in both cases the Optiview did not get me in so I went to the right upper quadrant to another old incision and was able to access it that way.  The other trocars  were in even in the abdomen and so they were brought in.  Six trocar sites in toto were placed all 5s except for one 11 was slightly to the right of the midline.  Satira Mccallum was used to retract the liver. Sharp dissection was then used to take down the adhesions to the wrap between the wrap and the liver.  Then carried this up and  identified the right and left crura, and then dissected the hernia and where the wrap had herniated up into the chest.  When this was all freed I  then went to the midline and undid the 3 sutures holding the stomach together.  We went completely around the esophagogastric junction and I carefully took the 2 pledgets off the wrap, was able to free those up. When that was completed, I re-closed the posterior crura with interrupted Surgidac in tie knots and then put 1 suture anteriorly and waited until I passed a 56 bougie which completely occupied the resultant space.  This was then pulled back and with this closure, I then installed a piece of [employer] mesh with the eccentric opening which I laid down so that the waist of the mesh was anterior over the area which had previously recurred.  It was then brought down using limb and sutured to the crura and sutured up anteriorly.  The 56 dilator was then reinserted and I grasped the upper portion of the wrapped stomach and plicated with 2 sutures above the GE junction.  These were secured with tie knots, and then a third was used to plicate the stomach over to some of the fatty tissue to come over but still scarred down.  We felt  that we had a good 360 degree wrap that was above the EG junction in the abdomen.  There was no tension on the stomach and dissected areas.  The [facility] biological mesh seemed to be in good position.  We then removed the Nathanson retractor and irrigated and then injected all the port sites with Exparel, then closed with 4-0 Vicryl and Dermabond.     Thornton Park Daphine Deutscher, MD     MBM/MEDQ  D:  02/09/2012  T:  02/10/2012  Job:  [id number]

## 2012-02-10 NOTE — Progress Notes (Signed)
Pt up ad lib in room and to BR today. Moving well with steady gait. Encouraged to ambulate in hall several times today yet pt refused, " I"ll feel more like it tomorrow."

## 2012-02-11 NOTE — Progress Notes (Signed)
Patient ID: Stephanie Bell, female   DOB: 1941/03/01, 71 y.o.   MRN: [id number] [facility] Progress Note:   2 Days Post-Op  Subjective: Mental status is clear.   Objective: Vital signs in last 24 hours: Temp:  [98.3 F (36.8 C)-99.4 F (37.4 C)] 98.9 F (37.2 C) (11/24 0445) Pulse Rate:  [78-88] 87  (11/24 0445) Resp:  [18] 18  (11/24 0445) BP: (137-175)/(78-95) 137/87 mmHg (11/24 0445) SpO2:  [94 %-97 %] 96 % (11/24 0445)  Intake/Output from previous day: 11/23 0701 - 11/24 0700 In: 2860 [P.O.:260; I.V.:2600] Out: 3200 [Urine:3200] Intake/Output this shift:    Physical Exam: Work of breathing is normal.  Minimal pain.  Doing well  Lab Results:  Results for orders placed during the hospital encounter of 02/09/12 (from the past 48 hour(s))  CBC     Status: Abnormal   Collection Time   02/09/12 12:40 PM      Component Value Range Comment   WBC 11.2 (*) 4.0 - 10.5 K/uL    RBC 4.21  3.87 - 5.11 MIL/uL    Hemoglobin 11.2 (*) 12.0 - 15.0 g/dL    HCT 40.9 (*) 81.1 - 46.0 %    MCV 77.2 (*) 78.0 - 100.0 fL    MCH 26.6  26.0 - 34.0 pg    MCHC 34.5  30.0 - 36.0 g/dL    RDW 91.4  78.2 - 95.6 %    Platelets 129 (*) 150 - 400 K/uL   CREATININE, SERUM     Status: Abnormal   Collection Time   02/09/12 12:40 PM      Component Value Range Comment   Creatinine, Ser 0.63  0.50 - 1.10 mg/dL    GFR calc non Af Amer 87 (*) >90 mL/min    GFR calc Af Amer >90  >90 mL/min   CBC     Status: Abnormal   Collection Time   02/10/12  4:35 AM      Component Value Range Comment   WBC 8.2  4.0 - 10.5 K/uL    RBC 3.87  3.87 - 5.11 MIL/uL    Hemoglobin 10.4 (*) 12.0 - 15.0 g/dL    HCT 21.3 (*) 08.6 - 46.0 %    MCV 77.8 (*) 78.0 - 100.0 fL    MCH 26.9  26.0 - 34.0 pg    MCHC 34.6  30.0 - 36.0 g/dL    RDW 57.8  46.9 - 62.9 %    Platelets SPECIMEN CHECKED FOR CLOTS  150 - 400 K/uL PLATELET CLUMPS NOTED ON SMEAR, COUNT APPEARS ADEQUATE    Radiology/Results: Dg Ugi W/water Sol  Cm  02/10/2012  *RADIOLOGY REPORT*  Clinical Data:  Status post redo Nissen fundoplication yesterday.  UPPER GI SERIES WITHOUT KUB  Technique:  Routine upper GI series was performed with  50 ml water- soluble contrast ( on the 300).  Fluoroscopy Time: 2.1 minutes  Comparison:  11/28/2011  Findings: The patient was only able to tolerate individual swallows, secondary to nausea.  Suspicion of small volume free intraperitoneal air, presumably postoperative.  No obstruction or contrast extravasation identified.  Normal proximal small bowel caliber.  Cholecystectomy clips.  IMPRESSION: No evidence of obstruction or contrast extravasation to suggest leak.  Exam of slightly decreased sensitivity, secondary the patient's ability to only take small swallows.   Original Report Authenticated By: Jeronimo Greaves, M.D.     Anti-infectives: Anti-infectives     Start     Dose/Rate Route Frequency Ordered  Stop   02/09/12 0601   cefOXitin (MEFOXIN) 2 g in dextrose 5 % 50 mL IVPB        2 g 100 mL/hr over 30 Minutes Intravenous 60 min pre-op 02/09/12 0601 02/09/12 0750          Assessment/Plan: Problem List: Patient Active Problem List  Diagnosis  . Paraesophageal hernia  . Hiatal hernia  . S/P Nissen fundoplication (without gastrostomy tube) procedure  . Breast cancer  . Sebaceous cyst of breast    Advance to full liquids.  Decrease IV rate 2 Days Post-Op    LOS: 2 days   [name], MD, [facility] [phone] beeper [phone]  02/11/2012 7:43 AM

## 2012-02-12 ENCOUNTER — Telehealth (INDEPENDENT_AMBULATORY_CARE_PROVIDER_SITE_OTHER): Payer: Self-pay | Admitting: General Surgery

## 2012-02-12 ENCOUNTER — Encounter (HOSPITAL_COMMUNITY): Payer: Self-pay | Admitting: Surgery

## 2012-02-12 LAB — CBC WITH DIFFERENTIAL/PLATELET
Basophils Relative: 1 % (ref 0–1)
Eosinophils Absolute: 0.3 10*3/uL (ref 0.0–0.7)
Eosinophils Relative: 7 % — ABNORMAL HIGH (ref 0–5)
HCT: 30.3 % — ABNORMAL LOW (ref 36.0–46.0)
Hemoglobin: 10.3 g/dL — ABNORMAL LOW (ref 12.0–15.0)
Lymphs Abs: 1.2 10*3/uL (ref 0.7–4.0)
MCH: 26.8 pg (ref 26.0–34.0)
MCHC: 34 g/dL (ref 30.0–36.0)
MCV: 78.9 fL (ref 78.0–100.0)
Monocytes Absolute: 0.5 10*3/uL (ref 0.1–1.0)
Monocytes Relative: 10 % (ref 3–12)
Neutrophils Relative %: 55 % (ref 43–77)
RBC: 3.84 MIL/uL — ABNORMAL LOW (ref 3.87–5.11)

## 2012-02-12 MED ORDER — DIPHENHYDRAMINE HCL 12.5 MG/5ML PO ELIX: 12.5000 mg | ORAL_SOLUTION | Freq: Four times a day (QID) | ORAL | Status: DC | PRN

## 2012-02-12 MED ORDER — OXYCODONE-ACETAMINOPHEN 5-325 MG/5ML PO SOLN: 5.0000 mL | ORAL | Status: DC | PRN

## 2012-02-12 NOTE — Telephone Encounter (Signed)
Pt called in stating she has an appointment with Dr. Daphine Deutscher on 02/29/12/. She wanted to see if her husband could be scheduled to see Dr. Daphine Deutscher on the same day. Patient said she spoke to Dr. Daphine Deutscher who said he would see both of them at the same time. I advised a message would have to be sent to his nurse to see if that would be possible based on his schedule. She agreed.  Spouse is MRN # [id number] Rojelio Brenner Pita DOB 12/20/35.

## 2012-02-12 NOTE — Discharge Summary (Addendum)
Physician Discharge Summary  Patient ID: Stephanie Bell MRN: [id number] DOB/AGE: 11-26-1940 71 y.o.  Admit date: 02/09/2012 Discharge date: 02/12/2012  Admission Diagnoses:  Recurrent GERD with herniated Nissen  Discharge Diagnoses:  same  Active Problems:  * No active hospital problems. *    Surgery:  Redo repair of recurrent hiatus hernia and redo Nissen fundoplication  Discharged Condition: improved  Hospital Course:   Had surgery.  PD swallow OK.  Observed for following two nights to prevent Nausea/vomiting.  Ready for discharge Monday, 02/12/12  Consults: none  Significant Diagnostic Studies: UGI    Discharge Exam: Blood pressure 135/84, pulse 69, temperature 98.1 F (36.7 C), temperature source Oral, resp. rate 18, height 5\' 5"  (1.651 m), weight 156 lb (70.761 kg), SpO2 96.00%. Nontenderl; swallowing OK  Disposition: 01-Home or Self Care  Discharge Orders    Future Appointments: Provider: Department: Dept Phone: Center:   02/29/2012 12:10 PM Valarie Merino, MD [facility], [place] [phone] None     Future Orders Please Complete By Expires   Diet - low sodium heart healthy      Scheduling Instructions:   Pureed for 5 weeks with full liquids   Increase activity slowly      No dressing needed          Medication List     As of 02/12/2012 11:32 AM    TAKE these medications         cholecalciferol 1000 UNITS tablet   Commonly known as: VITAMIN D   Take 5,000 Units by mouth daily.      diphenhydrAMINE 12.5 MG/5ML elixir   Commonly known as: BENADRYL   Take 5 mLs (12.5 mg total) by mouth every 6 (six) hours as needed for itching.      nitrofurantoin 100 MG capsule   Commonly known as: MACRODANTIN   Take 100 mg by mouth 4 (four) times daily.      omeprazole 20 MG capsule   Commonly known as: PRILOSEC   Take 20 mg by mouth daily.      oxyCODONE-acetaminophen 5-325 MG/5ML solution   Commonly known as: ROXICET   Take 5 mLs by mouth  every 4 (four) hours as needed for pain.      promethazine 6.25 MG/5ML syrup   Commonly known as: PHENERGAN   Take 10 mLs (12.5 mg total) by mouth 4 (four) times daily as needed. nausea      Saline 0.65 % (SOLN) Soln   Place 1 spray into the nose daily as needed. For nasal congestion      simethicone 80 MG chewable tablet   Commonly known as: MYLICON   Chew 80 mg by mouth once.            Follow-up Information    Follow up with Jayvion Stefanski B, MD. In 4 weeks.   Contact information:   [address] [place] [address] [phone]          Signed: Valarie Merino 02/12/2012, 11:28 AM

## 2012-02-14 ENCOUNTER — Telehealth (INDEPENDENT_AMBULATORY_CARE_PROVIDER_SITE_OTHER): Payer: Self-pay

## 2012-02-14 NOTE — Telephone Encounter (Signed)
Spoke with pt and scheduled her husbands appointment

## 2012-02-23 ENCOUNTER — Telehealth (INDEPENDENT_AMBULATORY_CARE_PROVIDER_SITE_OTHER): Payer: Self-pay

## 2012-02-23 NOTE — Telephone Encounter (Signed)
The pt called wanting detailed instructions about what she can and can't do as far as activities.  She wants to know about house work too such as sweeping.  She had a redo repair of hiatal hernia on 11/25.

## 2012-02-27 ENCOUNTER — Telehealth (INDEPENDENT_AMBULATORY_CARE_PROVIDER_SITE_OTHER): Payer: Self-pay

## 2012-02-27 NOTE — Telephone Encounter (Signed)
Returned pt's call regarding house work.  Pt states that she swept her garage on Fri 12/6 and became nauseous.  She also stated that she had some upper left chest pain right after surgery, and that the nurse explained that it was probably just gas from Sx... Pt says she still has this pain and that she "wouldn't be surprised if the doctors cracked a rib or something."  She also stated that Dr. Michaell Cowing had spoken with her over the weekend and prescribed her a nausea suppository which seems to help.  She says she has had almost no pain since Sx.  She was very appreciative of my call and spoke highly of our staff.  She has an appointment with MM on Fri 12/13

## 2012-02-29 ENCOUNTER — Ambulatory Visit (INDEPENDENT_AMBULATORY_CARE_PROVIDER_SITE_OTHER): Payer: Medicare Other | Admitting: Surgery

## 2012-02-29 ENCOUNTER — Encounter (INDEPENDENT_AMBULATORY_CARE_PROVIDER_SITE_OTHER): Payer: Self-pay | Admitting: Surgery

## 2012-02-29 VITALS — BP 122/88 | HR 80 | Temp 97.0°F | Resp 16 | Ht 65.0 in | Wt 150.6 lb

## 2012-02-29 DIAGNOSIS — Z9889 Other specified postprocedural states: Secondary | ICD-10-CM

## 2012-02-29 DIAGNOSIS — Z09 Encounter for follow-up examination after completed treatment for conditions other than malignant neoplasm: Secondary | ICD-10-CM

## 2012-02-29 NOTE — Progress Notes (Signed)
Stephanie Bell 71 y.o.  Body mass index is 25.06 kg/(m^2).  Patient Active Problem List  Diagnosis  . S/P Nissen fundoplication (without gastrostomy tube) procedure  . Breast cancer  . Sebaceous cyst of breast    Allergies  Allergen Reactions  . Sulfa Antibiotics Swelling and Rash    Past Surgical History  Procedure Date  . Vaginal hysterectomy   . Septoplasty   . Back surgery 05-09-11    x 2 lumbar  . Cholecystectomy 05-09-11    laparoscopic  . Tonsillectomy 05-09-11    Tonsillectomy  . Kidney stone surgery 05-09-11    past hx. and cystoscopy for bladder issues  . Laparoscopic nissen fundoplication 05/16/2011    Procedure: LAPAROSCOPIC NISSEN FUNDOPLICATION;  Surgeon: Valarie Merino, MD;  Location: [facility];  Service: General;  Laterality: N/A;  will also remove sebaceous cyst of chest wall  . Irrigation and debridement sebaceous cyst 05/16/2011    Procedure: IRRIGATION AND DEBRIDEMENT SEBACEOUS CYST;  Surgeon: Valarie Merino, MD;  Location: [facility];  Service: General;  Laterality: N/A;  middle of chest on sternum  . Umbilical hernia repair 05/16/2011    Procedure: HERNIA REPAIR UMBILICAL ADULT;  Surgeon: Valarie Merino, MD;  Location: [facility];  Service: General;  Laterality: N/A;  with mesh   . Hernia repair 05/16/2011    hiatal hernia  . Colonoscopy   . Laparoscopic nissen fundoplication 02/09/2012    Procedure: LAPAROSCOPIC NISSEN FUNDOPLICATION;  Surgeon: Valarie Merino, MD;  Location: [facility];  Service: General;  Laterality: N/A;  Re-do Laparoscopic Nissen   . Hiatal hernia repair 02/09/2012    Procedure: LAPAROSCOPIC REPAIR OF HIATAL HERNIA;  Surgeon: Valarie Merino, MD;  Location: [facility];  Service: General;  Laterality: N/A;  . Insertion of mesh 02/09/2012    Procedure: INSERTION OF MESH;  Surgeon: Valarie Merino, MD;  Location: [facility];  Service: Windell Moment, MD No diagnosis found.  Doing well as far. Taking a very easy. Advancing diet slowly. She  is 3 weeks out from a redo Nissen fundoplication. Advise sheeting get in the hot tub. She can restart water aerobics. I'll see her back in 6 weeks [name], MD, [facility] [phone] beeper [phone]  02/29/2012 1:38 PM

## 2012-02-29 NOTE — Patient Instructions (Signed)
Thanks for your patience.  If you need further assistance after leaving the office, please call our office and speak with a [facility] nurse.  [phone].  If you want to leave a message for Dr. Temitope Griffing, please call his office phone at [phone]. 

## 2012-03-31 ENCOUNTER — Telehealth (INDEPENDENT_AMBULATORY_CARE_PROVIDER_SITE_OTHER): Payer: Self-pay | Admitting: General Surgery

## 2012-03-31 ENCOUNTER — Other Ambulatory Visit (INDEPENDENT_AMBULATORY_CARE_PROVIDER_SITE_OTHER): Payer: Self-pay | Admitting: General Surgery

## 2012-03-31 NOTE — Telephone Encounter (Signed)
She called complaining of extreme nausea that started about 3-4 days ago.  She felt something pull around the area of her redo hiatal hernia repair prior to having this.  I suggested a liquid diet and called in a prescription for Phenergan suppositories.  She may need an UGI.  Will discuss with Dr. Daphine Deutscher.

## 2012-04-01 ENCOUNTER — Telehealth (INDEPENDENT_AMBULATORY_CARE_PROVIDER_SITE_OTHER): Payer: Self-pay | Admitting: General Surgery

## 2012-04-01 NOTE — Telephone Encounter (Signed)
Patient called in stating that she has still been dealing with bad nausea and vomiting since surgery and the medication given last week does not seem to be helping. Patient is worried because she is starting to feel weak and having a hard time keeping anything down. She has been on liquids and very soft food (baby food). She cannot drink shakes or ensure because she will bring it back up. Patient wanted to know what testing could be done to figure out the cause of her nausea.   Per the notation on the chart by Dr. Abbey Chatters, she may need and UGI. I advised the patient that I would forward this information to Dr. Daphine Deutscher and his nurse and see if she can be scheduled for the UGI. Patient prefers to have the test done before coming to see Dr. Daphine Deutscher. She is worried, she cannot even take a multivitamin gummy because she brings that up as well. Right now the patient has a post op appointment to see Dr. Daphine Deutscher on 04/19/12 at 9:50.

## 2012-04-03 ENCOUNTER — Ambulatory Visit (INDEPENDENT_AMBULATORY_CARE_PROVIDER_SITE_OTHER): Payer: Medicare Other | Admitting: Surgery

## 2012-04-03 ENCOUNTER — Encounter (INDEPENDENT_AMBULATORY_CARE_PROVIDER_SITE_OTHER): Payer: Self-pay | Admitting: Surgery

## 2012-04-03 VITALS — BP 108/68 | HR 88 | Temp 97.4°F | Resp 14 | Ht 65.0 in | Wt 141.8 lb

## 2012-04-03 DIAGNOSIS — R110 Nausea: Secondary | ICD-10-CM

## 2012-04-03 NOTE — Progress Notes (Signed)
Stephanie Bell 72 y.o.  Body mass index is 23.60 kg/(m^2).  Patient Active Problem List  Diagnosis  . S/P Nissen fundoplication (without gastrostomy tube) procedure  . Breast cancer  . Sebaceous cyst of breast    Allergies  Allergen Reactions  . Sulfa Antibiotics Swelling and Rash    Past Surgical History  Procedure Date  . Vaginal hysterectomy   . Septoplasty   . Back surgery 05-09-11    x 2 lumbar  . Cholecystectomy 05-09-11    laparoscopic  . Tonsillectomy 05-09-11    Tonsillectomy  . Kidney stone surgery 05-09-11    past hx. and cystoscopy for bladder issues  . Laparoscopic nissen fundoplication 05/16/2011    Procedure: LAPAROSCOPIC NISSEN FUNDOPLICATION;  Surgeon: Valarie Merino, MD;  Location: [facility];  Service: General;  Laterality: N/A;  will also remove sebaceous cyst of chest wall  . Irrigation and debridement sebaceous cyst 05/16/2011    Procedure: IRRIGATION AND DEBRIDEMENT SEBACEOUS CYST;  Surgeon: Valarie Merino, MD;  Location: [facility];  Service: General;  Laterality: N/A;  middle of chest on sternum  . Umbilical hernia repair 05/16/2011    Procedure: HERNIA REPAIR UMBILICAL ADULT;  Surgeon: Valarie Merino, MD;  Location: [facility];  Service: General;  Laterality: N/A;  with mesh   . Hernia repair 05/16/2011    hiatal hernia  . Colonoscopy   . Laparoscopic nissen fundoplication 02/09/2012    Procedure: LAPAROSCOPIC NISSEN FUNDOPLICATION;  Surgeon: Valarie Merino, MD;  Location: [facility];  Service: General;  Laterality: N/A;  Re-do Laparoscopic Nissen   . Hiatal hernia repair 02/09/2012    Procedure: LAPAROSCOPIC REPAIR OF HIATAL HERNIA;  Surgeon: Valarie Merino, MD;  Location: [facility];  Service: General;  Laterality: N/A;  . Insertion of mesh 02/09/2012    Procedure: INSERTION OF MESH;  Surgeon: Valarie Merino, MD;  Location: [facility];  Service: Windell Moment, MD No diagnosis found.  [name] comes in today and is complaining of nausea. Since she  stayed  with her husband at the hospital after  his inguinal hernia repair she's been nauseated. She is afraid to eat.  She is taking Phenergan suppositories prescribed by Dr. Abbey Chatters.she describes a fall on her back on December 18 and wondered if she toward something. I think we will go and get an upper GI series on her to see what that looks like. [name], MD, [facility] [phone] beeper [phone]  04/03/2012 4:15 PM

## 2012-04-10 ENCOUNTER — Other Ambulatory Visit (INDEPENDENT_AMBULATORY_CARE_PROVIDER_SITE_OTHER): Payer: Self-pay | Admitting: Surgery

## 2012-04-10 ENCOUNTER — Ambulatory Visit
Admission: RE | Admit: 2012-04-10 | Discharge: 2012-04-10 | Disposition: A | Discharge status: Home / Self Care | Payer: Medicare Other | Source: Ambulatory Visit | Attending: Surgery | Admitting: Surgery

## 2012-04-10 DIAGNOSIS — R110 Nausea: Secondary | ICD-10-CM

## 2012-04-19 ENCOUNTER — Ambulatory Visit (INDEPENDENT_AMBULATORY_CARE_PROVIDER_SITE_OTHER): Payer: Medicare Other | Admitting: Surgery

## 2012-04-19 ENCOUNTER — Encounter (INDEPENDENT_AMBULATORY_CARE_PROVIDER_SITE_OTHER): Payer: Self-pay | Admitting: Surgery

## 2012-04-19 VITALS — BP 158/64 | HR 80 | Resp 16 | Ht 65.0 in | Wt 145.2 lb

## 2012-04-19 DIAGNOSIS — Z09 Encounter for follow-up examination after completed treatment for conditions other than malignant neoplasm: Secondary | ICD-10-CM

## 2012-04-19 DIAGNOSIS — Z9889 Other specified postprocedural states: Secondary | ICD-10-CM

## 2012-04-19 NOTE — Progress Notes (Signed)
Stephanie Bell 72 y.o.  Body mass index is 24.16 kg/(m^2).  Patient Active Problem List  Diagnosis  . S/P Nissen fundoplication (without gastrostomy tube) procedure  . Breast cancer  . Sebaceous cyst of breast    Allergies  Allergen Reactions  . Sulfa Antibiotics Swelling and Rash    Past Surgical History  Procedure Date  . Vaginal hysterectomy   . Septoplasty   . Back surgery 05-09-11    x 2 lumbar  . Cholecystectomy 05-09-11    laparoscopic  . Tonsillectomy 05-09-11    Tonsillectomy  . Kidney stone surgery 05-09-11    past hx. and cystoscopy for bladder issues  . Laparoscopic nissen fundoplication 05/16/2011    Procedure: LAPAROSCOPIC NISSEN FUNDOPLICATION;  Surgeon: Stephanie Merino, MD;  Location: [facility];  Service: General;  Laterality: N/A;  will also remove sebaceous cyst of chest wall  . Irrigation and debridement sebaceous cyst 05/16/2011    Procedure: IRRIGATION AND DEBRIDEMENT SEBACEOUS CYST;  Surgeon: Stephanie Merino, MD;  Location: [facility];  Service: General;  Laterality: N/A;  middle of chest on sternum  . Umbilical hernia repair 05/16/2011    Procedure: HERNIA REPAIR UMBILICAL ADULT;  Surgeon: Stephanie Merino, MD;  [facility]: [facility];  Service: General;  Laterality: N/A;  with mesh   . Hernia repair 05/16/2011    hiatal hernia  . Colonoscopy   . Laparoscopic nissen fundoplication 02/09/2012    Procedure: LAPAROSCOPIC NISSEN FUNDOPLICATION;  Surgeon: Stephanie Merino, MD;  [facility]: [facility];  Service: General;  Laterality: N/A;  Re-do Laparoscopic Nissen   . Hiatal hernia repair 02/09/2012    Procedure: LAPAROSCOPIC REPAIR OF HIATAL HERNIA;  Surgeon: Stephanie Merino, MD;  [facility];  Service: General;  Laterality: N/A;  . Insertion of mesh 02/09/2012    Procedure: INSERTION OF MESH;  Surgeon: Stephanie Merino, MD;  [facility];  Service: Windell Moment, MD No diagnosis found.  [name] comes in today and I reviewed her upper GI series. This  was essentially unchanged from her immediate postop 1. They did note that she was very constipated. I've encouraged her to use MiraLAX for constipation. She has been working with Stephanie Bell on her lower spine and then advised and I agree that she should work on her abdominal core muscles. I will see her again in 3 months. She may try to resume playing some tenderness although would not be terribly competitive in this effort. Plan return in 3 months Stephanie B. Daphine Deutscher, MD, [facility] [phone] beeper [phone]  04/19/2012 10:35 AM

## 2012-04-19 NOTE — Patient Instructions (Addendum)
May work on core exercises to build abdominal strength.   Use Miralax for constipation

## 2012-05-08 ENCOUNTER — Telehealth (INDEPENDENT_AMBULATORY_CARE_PROVIDER_SITE_OTHER): Payer: Self-pay | Admitting: General Surgery

## 2012-05-08 NOTE — Telephone Encounter (Signed)
Pt called to ask if okay to hit some tennis balls, not actually run about to play games.  She is S/P Nissan surgery and is receiving PT for her lower back at [facility] in [place].  Paged and updated Dr. Daphine Deutscher, who considers it too soon to be hitting tennis balls.  Called pt with his response and suggested she limit "strengthening her core" to daily walking 30-60 minutes.  She understands and stated her husband had suggested the try to hit some balls, but she wasn't sure she was ready for that yet.  I reminded her that she is rehab-ing not only from her Nissan repair, but also a low back injury and these will take quite a while to fully heal.

## 2012-06-28 ENCOUNTER — Encounter (INDEPENDENT_AMBULATORY_CARE_PROVIDER_SITE_OTHER): Payer: Medicare Other | Admitting: Surgery

## 2012-07-18 ENCOUNTER — Ambulatory Visit (INDEPENDENT_AMBULATORY_CARE_PROVIDER_SITE_OTHER): Payer: Medicare Other | Admitting: Surgery

## 2012-07-18 ENCOUNTER — Encounter (INDEPENDENT_AMBULATORY_CARE_PROVIDER_SITE_OTHER): Payer: Self-pay | Admitting: Surgery

## 2012-07-18 VITALS — BP 128/76 | HR 90 | Temp 98.0°F | Resp 16 | Ht 65.0 in | Wt 160.0 lb

## 2012-07-18 DIAGNOSIS — Z09 Encounter for follow-up examination after completed treatment for conditions other than malignant neoplasm: Secondary | ICD-10-CM

## 2012-07-18 DIAGNOSIS — Z9889 Other specified postprocedural states: Secondary | ICD-10-CM

## 2012-07-18 NOTE — Progress Notes (Signed)
Stephanie Bell 72 y.o.  Body mass index is 26.63 kg/(m^2).  Patient Active Problem List   Diagnosis Date Noted  . Sebaceous cyst of breast 12/20/2011  . Breast cancer 12/13/2011  . S/P Nissen fundoplication (without gastrostomy tube) procedure 06/08/2011    Allergies  Allergen Reactions  . Sulfa Antibiotics Swelling and Rash    Past Surgical History  Procedure Laterality Date  . Vaginal hysterectomy    . Septoplasty    . Back surgery  05-09-11    x 2 lumbar  . Cholecystectomy  05-09-11    laparoscopic  . Tonsillectomy  05-09-11    Tonsillectomy  . Kidney stone surgery  05-09-11    past hx. and cystoscopy for bladder issues  . Laparoscopic nissen fundoplication  05/16/2011    Procedure: LAPAROSCOPIC NISSEN FUNDOPLICATION;  Surgeon: Stephanie Merino, MD;  [facility];  Service: General;  Laterality: N/A;  will also remove sebaceous cyst of chest wall  . Irrigation and debridement sebaceous cyst  05/16/2011    Procedure: IRRIGATION AND DEBRIDEMENT SEBACEOUS CYST;  Surgeon: Stephanie Merino, MD;  [facility];  Service: General;  Laterality: N/A;  middle of chest on sternum  . Umbilical hernia repair  05/16/2011    Procedure: HERNIA REPAIR UMBILICAL ADULT;  Surgeon: Stephanie Merino, MD;  [facility];  Service: General;  Laterality: N/A;  with mesh   . Hernia repair  05/16/2011    hiatal hernia  . Colonoscopy    . Laparoscopic nissen fundoplication  02/09/2012    Procedure: LAPAROSCOPIC NISSEN FUNDOPLICATION;  Surgeon: Stephanie Merino, MD;  [facility];  Service: General;  Laterality: N/A;  Re-do Laparoscopic Nissen   . Hiatal hernia repair  02/09/2012    Procedure: LAPAROSCOPIC REPAIR OF HIATAL HERNIA;  Surgeon: Stephanie Merino, MD;  [facility];  Service: General;  Laterality: N/A;  . Insertion of mesh  02/09/2012    Procedure: INSERTION OF MESH;  Surgeon: Stephanie Merino, MD;  [facility];  Service: Stephanie Moment, MD No diagnosis  found.  She is doing great with water aerobics. This has helped her back immensely. She is not having any reflux issues. I told her she could gradually use in applying some non-competitive tennis. She Stephanie Bell her to be taking some RV trips. I'll plan to see her again in 6 months in routine followup. Stephanie B. Daphine Deutscher, MD, [facility] [phone] beeper [phone]  07/18/2012 10:28 AM

## 2012-07-29 ENCOUNTER — Encounter: Payer: Self-pay | Admitting: Internal Medicine

## 2012-09-05 ENCOUNTER — Ambulatory Visit (AMBULATORY_SURGERY_CENTER): Payer: Medicare Other | Admitting: *Deleted

## 2012-09-05 ENCOUNTER — Telehealth: Payer: Self-pay

## 2012-09-05 VITALS — Ht 65.0 in | Wt 161.4 lb

## 2012-09-05 DIAGNOSIS — Z1211 Encounter for screening for malignant neoplasm of colon: Secondary | ICD-10-CM

## 2012-09-05 MED ORDER — NA SULFATE-K SULFATE-MG SULF 17.5-3.13-1.6 GM/177ML PO SOLN: 1.0000 | Freq: Once | ORAL | Status: DC

## 2012-09-05 NOTE — Telephone Encounter (Signed)
Message copied by [place], Scarlett Portlock E on Thu Sep 05, 2012 12:26 PM ------      Message from: Alice Rieger      Created: Thu Sep 05, 2012 12:00 PM       Pt with history of previous colonoscopy 08/2006 reportedly with Dr. Laural Benes in [place]. On further thought from the patient she stated it may be Dr. Danise Edge with [facility]. Request for records release signed by patient and will give to PJ to request previous colonoscopy records for this patient.  ------

## 2012-09-05 NOTE — Progress Notes (Signed)
Denies allergies to eggs or soy products. Denies complications with anesthesia and sedation. 

## 2012-09-05 NOTE — Progress Notes (Signed)
Pt with history of previous colonoscopy 08/2006 reportedly with Dr. Laural Benes in [place]. On further thought from the patient she stated it may be Dr. Danise Edge with [facility]. Request for records release signed by patient and will give to PJ to request previous colonoscopy records for this patient.

## 2012-09-05 NOTE — Telephone Encounter (Signed)
Faxed ROI to Dr. Danise Edge at fax# [phone] to get GI records.

## 2012-09-06 ENCOUNTER — Encounter: Payer: Self-pay | Admitting: Internal Medicine

## 2012-09-11 ENCOUNTER — Encounter: Payer: Self-pay | Admitting: Internal Medicine

## 2012-09-12 ENCOUNTER — Telehealth: Payer: Self-pay

## 2012-09-12 ENCOUNTER — Telehealth: Payer: Self-pay | Admitting: *Deleted

## 2012-09-12 NOTE — Telephone Encounter (Signed)
The patient has been notified of this information and colonoscopy canceled for 10/16/12. Appointment made for 09/16/12 at 9:45 with Dr. [name] to discuss this further.

## 2012-09-12 NOTE — Telephone Encounter (Signed)
Message copied by [place], Chyla Schlender E on Thu Sep 12, 2012  7:28 PM ------      Message from: [name] E      Created: Wed Sep 11, 2012  4:54 PM       I did review records and would not recommend a routine repeat colonoscopy as she has not had adenomatous polyps            I suggest she see me in office and will have PJ call her about this and cancel the colonoscopy            [facility]      ----- Message -----         From: Alice Rieger, RN         Sent: 09/05/2012  12:00 PM           To: Iva Boop, MD            Dr. [name],            The previsit for the above named patient was completed. See notes, including progress notes about previous GI hx. Records release signed and given to PJ to request prior GI records. She does have complaints of abnormal bowel movements but states that has improved over the last two weeks with using All Bran. She relates a complicated history around her fundoplications. I left her colonoscopy as scheduled but wanted to check with you if you would prefer to see her in the office or proceed with direct colonoscopy October 16, 2012.       ------

## 2012-09-12 NOTE — Telephone Encounter (Signed)
Message copied by Alice Rieger on Thu Sep 12, 2012  7:10 AM ------      Message from: [name] E      Created: Wed Sep 11, 2012  4:54 PM       I did review records and would not recommend a routine repeat colonoscopy as she has not had adenomatous polyps            I suggest she see me in office and will have PJ call her about this and cancel the colonoscopy            CEG      ----- Message -----         From: Alice Rieger, RN         Sent: 09/05/2012  12:00 PM           To: Iva Boop, MD            Dr. [name],            The previsit for the above named patient was completed. See notes, including progress notes about previous GI hx. Records release signed and given to PJ to request prior GI records. She does have complaints of abnormal bowel movements but states that has improved over the last two weeks with using All Bran. She relates a complicated history around her fundoplications. I left her colonoscopy as scheduled but wanted to check with you if you would prefer to see her in the office or proceed with direct colonoscopy October 16, 2012.       ------

## 2012-09-12 NOTE — Telephone Encounter (Signed)
I did review records and would not recommend a routine repeat colonoscopy as she has not had adenomatous polyps  I suggest she see me in office and will have PJ call her about this and cancel the colonoscopy [facility]   ----- Message ----- From: Alice Rieger, RN Sent: 09/05/2012 12:00 PM To: Iva Boop, MD   Dr. [name], The previsit for the above named patient was completed. See notes, including progress notes about previous GI hx. Records release signed and given to PJ to request prior GI records. She does have complaints of abnormal bowel movements but states that has improved over the last two weeks with using All Bran. She relates a complicated history around her fundoplications. I left her colonoscopy as scheduled but wanted to check with you if you would prefer to see her in the office or proceed with direct colonoscopy October 16, 2012.

## 2012-09-16 ENCOUNTER — Encounter: Payer: Self-pay | Admitting: Internal Medicine

## 2012-09-16 ENCOUNTER — Other Ambulatory Visit: Payer: Self-pay | Admitting: Internal Medicine

## 2012-09-16 ENCOUNTER — Ambulatory Visit (INDEPENDENT_AMBULATORY_CARE_PROVIDER_SITE_OTHER): Payer: Medicare Other | Admitting: Internal Medicine

## 2012-09-16 VITALS — BP 130/80 | HR 68 | Ht 64.75 in | Wt 163.2 lb

## 2012-09-16 DIAGNOSIS — R194 Change in bowel habit: Secondary | ICD-10-CM

## 2012-09-16 DIAGNOSIS — R198 Other specified symptoms and signs involving the digestive system and abdomen: Secondary | ICD-10-CM

## 2012-09-16 MED ORDER — SOD PICOSULFATE-MAG OX-CIT ACD 10-3.5-12 MG-GM-GM PO PACK: 1.0000 | PACK | Freq: Once | ORAL | Status: DC

## 2012-09-16 NOTE — Progress Notes (Signed)
Subjective:    Patient ID: Stephanie Bell, female    DOB: 1941/02/17, 72 y.o.   MRN: [id number]  HPI The patient is a delightful woman I know from previous paraesophageal hiatal hernia problems, I had recommended surgery she had that and her repair slip so she had a redo with mesh. That has gone well. Since that time late last year she has been on a liquid diet for many months, and a slowly return to eating. Along the way she has noticed a change in bowel habits with constipation and small caliber stools. With the addition of some bulbar and she would have some normal caliber stools but not persistently. She has had 2 colonoscopies, one in 2002 and then one in 2008. On the initial when she had a diminutive rectal hyperplastic polyp. She had left-sided diverticulosis on the second colonoscopy and probably on the first as well. She has not had bleeding. She has lost a significant amount of weight though her diet changed. She was sent for a direct colonoscopy with the IV that this is repeat screening so we initially canceled the procedure. An office visit was requested so she is here. She is using a combination of milk of magnesia and a fiber and bran and also MiraLax at times to maintain regular bowel habits. Allergies  Allergen Reactions  . Sulfa Antibiotics Swelling and Rash   Outpatient Prescriptions Prior to Visit  Medication Sig Dispense Refill  . aspirin 81 MG tablet Take 81 mg by mouth daily.      . cholecalciferol (VITAMIN D) 1000 UNITS tablet Take 5,000 Units by mouth daily.       . methylphenidate (RITALIN) 10 MG tablet Take 10 mg by mouth 2 (two) times daily.      . Multiple Vitamin (MULTIVITAMIN) tablet Take 1 tablet by mouth daily.      . nitrofurantoin (MACRODANTIN) 100 MG capsule Take 100 mg by mouth 4 (four) times daily.      Marland Kitchen omeprazole (PRILOSEC) 20 MG capsule Take 20 mg by mouth daily.      . Saline 0.65 % (SOLN) SOLN Place 1 spray into the nose daily as needed. For nasal  congestion      . simethicone (MYLICON) 80 MG chewable tablet Chew 80 mg by mouth as needed.       . Na Sulfate-K Sulfate-Mg Sulf SOLN Take 1 kit by mouth once. suprep as directed. No substitutions.  354 mL  0  . benzonatate (TESSALON) 100 MG capsule Take 100 mg by mouth 3 (three) times daily as needed for cough.      . diphenhydrAMINE (BENADRYL) 12.5 MG/5ML elixir Take 5 mLs (12.5 mg total) by mouth every 6 (six) hours as needed for itching.  120 mL  1  . promethazine (PHENERGAN) 6.25 MG/5ML syrup Take 10 mLs (12.5 mg total) by mouth 4 (four) times daily as needed. nausea  120 mL  0   No facility-administered medications prior to visit.   Past Medical History  Diagnosis Date  . Interstitial cystitis   . Hx: UTI (urinary tract infection)   . Weight loss   . Wound disruption, post-op, skin   . Pneumonia 05-09-11    2011 last time  . Paraesophageal hernia   . GERD (gastroesophageal reflux disease)   . H/O hiatal hernia    Past Surgical History  Procedure Laterality Date  . Vaginal hysterectomy    . Septoplasty    . Back surgery  05-09-11  x 2 lumbar  . Cholecystectomy  05-09-11    laparoscopic  . Tonsillectomy  05-09-11    Tonsillectomy  . Kidney stone surgery  05-09-11    past hx. and cystoscopy for bladder issues  . Laparoscopic nissen fundoplication  05/16/2011    Procedure: LAPAROSCOPIC NISSEN FUNDOPLICATION;  Surgeon: Valarie Merino, MD;  [facility]: [facility];  Service: General;  Laterality: N/A;  will also remove sebaceous cyst of chest wall  . Irrigation and debridement sebaceous cyst  05/16/2011    Procedure: IRRIGATION AND DEBRIDEMENT SEBACEOUS CYST;  Surgeon: Valarie Merino, MD;  Location: [facility];  Service: General;  Laterality: N/A;  middle of chest on sternum  . Umbilical hernia repair  05/16/2011    Procedure: HERNIA REPAIR UMBILICAL ADULT;  Surgeon: Valarie Merino, MD;  [facility]: [facility];  Service: General;  Laterality: N/A;  with mesh   . Hernia repair  05/16/2011     hiatal hernia  . Colonoscopy  2002, 2008     rectal hyperplastic polyp 2002, diverticulosis 2008  . Laparoscopic nissen fundoplication  02/09/2012    Procedure: LAPAROSCOPIC NISSEN FUNDOPLICATION;  Surgeon: Valarie Merino, MD;  [facility]: [facility];  Service: General;  Laterality: N/A;  Re-do Laparoscopic Nissen   . Hiatal hernia repair  02/09/2012    Procedure: LAPAROSCOPIC REPAIR OF HIATAL HERNIA;  Surgeon: Valarie Merino, MD;  [facility];  Service: General;  Laterality: N/A;  . Insertion of mesh  02/09/2012    Procedure: INSERTION OF MESH;  Surgeon: Valarie Merino, MD;  [facility]: [facility];  Service: General;;   History   Social History  . Marital Status: Married    Spouse Name: N/A    Number of Children: N/A  . Years of Education: N/A   Social History Main Topics  . Smoking status: Never Smoker   . Smokeless tobacco: Never Used  . Alcohol Use: 1.8 oz/week    3 Glasses of wine per week  . Drug Use: No  . Sexually Active: Yes   Other Topics Concern  . None   Social History Narrative  . None   Family History  Problem Relation Age of Onset  . Colon cancer Neg Hx   . Ulcers Paternal Grandmother   . Ulcers Father   . Alcohol abuse Father     Review of Systems As above    Objective:   Physical Exam Well-developed well-nourished no acute distress       Assessment & Plan:   1. Change in bowel habits    1. Proceed with colonoscopy. 2. We will reschedule her and evaluate his change in bowel habits. 3. In order to minimize the chance of vomiting with her prep, will use Prep-o-Pik, a low-volume colonoscopy preparation.  CC: HEDRICK,JAMES, MD

## 2012-09-16 NOTE — Patient Instructions (Addendum)
You have been scheduled for a colonoscopy with propofol. Please follow written instructions given to you at your visit today.  Please use the prepopik sample we are going to obtain for you. If you use inhalers (even only as needed), please bring them with you on the day of your procedure. Your physician has requested that you go to www.[email or web address].com and enter the access code given to you at your visit today. This web site gives a general overview about your procedure. However, you should still follow specific instructions given to you by our office regarding your preparation for the procedure.  I appreciate the opportunity to care for you.

## 2012-09-17 ENCOUNTER — Encounter: Payer: Self-pay | Admitting: Internal Medicine

## 2012-09-19 ENCOUNTER — Encounter: Payer: Medicare Other | Admitting: Internal Medicine

## 2012-10-16 ENCOUNTER — Encounter: Payer: Self-pay | Admitting: Internal Medicine

## 2012-10-16 ENCOUNTER — Encounter: Payer: Medicare Other | Admitting: Internal Medicine

## 2012-10-16 ENCOUNTER — Ambulatory Visit (AMBULATORY_SURGERY_CENTER): Payer: Medicare Other | Admitting: Internal Medicine

## 2012-10-16 VITALS — BP 131/66 | HR 67 | Temp 97.5°F | Resp 23 | Ht 64.0 in | Wt 163.0 lb

## 2012-10-16 DIAGNOSIS — R198 Other specified symptoms and signs involving the digestive system and abdomen: Secondary | ICD-10-CM

## 2012-10-16 MED ORDER — SODIUM CHLORIDE 0.9 % IV SOLN
500.0000 mL | INTRAVENOUS | Status: DC
Start: 2012-10-16 — End: 2012-10-16

## 2012-10-16 NOTE — Op Note (Signed)
[facility] [address] [place], [address]   COLONOSCOPY PROCEDURE REPORT  PATIENT: Stephanie Bell, Stephanie Bell  MR#: [id number] BIRTHDATE: 1940-04-23 , 72  yrs. old GENDER: Female ENDOSCOPIST: Iva Boop, MD, [facility] PROCEDURE DATE:  10/16/2012 PROCEDURE:   Colonoscopy, diagnostic First Screening Colonoscopy - Avg.  risk and is 50 yrs.  old or older - No.  Prior Negative Screening - Now for repeat screening. N/A  History of Adenoma - Now for follow-up colonoscopy & has been > or = to 3 yrs.  N/A ASA CLASS:   Class II INDICATIONS:Change in bowel habits. MEDICATIONS: propofol (Diprivan) 200mg  IV, MAC sedation, administered by CRNA, and These medications were titrated to patient response per physician's verbal order  DESCRIPTION OF PROCEDURE:   After the risks benefits and alternatives of the procedure were thoroughly explained, informed consent was obtained.  A digital rectal exam revealed no abnormalities of the rectum.   The LB [id number]  endoscope was introduced through the anus and advanced to the cecum, which was identified by both the appendix and ileocecal valve. No adverse events experienced.   The quality of the prep was Prepopik good The instrument was then slowly withdrawn as the colon was fully examined.      COLON FINDINGS: There was a small nn-bleeding AVM in the cecum. otherwise there was a normal appearing cecum, ileocecal valve, and appendiceal orifice.  The ascending, hepatic flexure, transverse, splenic flexure, descending, sigmoid colon and rectum appeared unremarkable.  No polyps or cancers were seen.  Retroflexed views revealed internal/external hemorrhoids. The time to cecum=4 minutes 18 seconds.  Withdrawal time=7 minutes 53 seconds.  The scope was withdrawn and the procedure completed. COMPLICATIONS: There were no complications.  ENDOSCOPIC IMPRESSION: 1.   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   External  hemorrhoids 3.   Internal hemorrhoids  RECOMMENDATIONS: Continue fiber and MOM/MiraLx (recommend 2-3 tablespoons of Benefiber every day) If that is not working return to see Dr.  [name]   eSigned:  Iva Boop, MD, [facility] 10/16/2012 3:55 PM   cc: [name], MD, Luretha Murphy, MD, and The Patient

## 2012-10-16 NOTE — Progress Notes (Signed)
Patient did not experience any of the following events: a burn prior to discharge; a fall within the facility; wrong site/side/patient/procedure/implant event; or a hospital transfer or hospital admission upon discharge from the facility. (G8907) Patient did not have preoperative order for IV antibiotic SSI prophylaxis. (G8918)  

## 2012-10-16 NOTE — Patient Instructions (Addendum)
Your colon was normal. You do have so internal and external hemorrhoids.  Please continue fiber and MOM/MiraLax. Try Benefiber 2-3 tablespoons a day.  See me as needed.  I appreciate the opportunity to care for you. Iva Boop, MD, [facility]  Discharge instructions given with verbal understanding. Handout on hemorrhoids. Resume previous medications. YOU HAD AN ENDOSCOPIC PROCEDURE TODAY AT THE [facility]: Refer to the procedure report that was given to you for any specific questions about what was found during the examination.  If the procedure report does not answer your questions, please call your gastroenterologist to clarify.  If you requested that your care partner not be given the details of your procedure findings, then the procedure report has been included in a sealed envelope for you to review at your convenience later.  YOU SHOULD EXPECT: Some feelings of bloating in the abdomen. Passage of more gas than usual.  Walking can help get rid of the air that was put into your GI tract during the procedure and reduce the bloating. If you had a lower endoscopy (such as a colonoscopy or flexible sigmoidoscopy) you may notice spotting of blood in your stool or on the toilet paper. If you underwent a bowel prep for your procedure, then you may not have a normal bowel movement for a few days.  DIET: Your first meal following the procedure should be a light meal and then it is ok to progress to your normal diet.  A half-sandwich or bowl of soup is an example of a good first meal.  Heavy or fried foods are harder to digest and may make you feel nauseous or bloated.  Likewise meals heavy in dairy and vegetables can cause extra gas to form and this can also increase the bloating.  Drink plenty of fluids but you should avoid alcoholic beverages for 24 hours.  ACTIVITY: Your care partner should take you home directly after the procedure.  You should plan to take it easy, moving slowly for  the rest of the day.  You can resume normal activity the day after the procedure however you should NOT DRIVE or use heavy machinery for 24 hours (because of the sedation medicines used during the test).    SYMPTOMS TO REPORT IMMEDIATELY: A gastroenterologist can be reached at any hour.  During normal business hours, 8:30 AM to 5:00 PM Monday through Friday, call [phone].  After hours and on weekends, please call the GI answering service at [phone] who will take a message and have the physician on call contact you.   Following lower endoscopy (colonoscopy or flexible sigmoidoscopy):  Excessive amounts of blood in the stool  Significant tenderness or worsening of abdominal pains  Swelling of the abdomen that is new, acute  Fever of 100F or higher  FOLLOW UP: If any biopsies were taken you will be contacted by phone or by letter within the next 1-3 weeks.  Call your gastroenterologist if you have not heard about the biopsies in 3 weeks.  Our staff will call the home number listed on your records the next business day following your procedure to check on you and address any questions or concerns that you may have at that time regarding the information given to you following your procedure. This is a courtesy call and so if there is no answer at the home number and we have not heard from you through the emergency physician on call, we will assume that you have returned to  your regular daily activities without incident.  SIGNATURES/CONFIDENTIALITY: You and/or your care partner have signed paperwork which will be entered into your electronic medical record.  These signatures attest to the fact that that the information above on your After Visit Summary has been reviewed and is understood.  Full responsibility of the confidentiality of this discharge information lies with you and/or your care-partner. 

## 2012-10-17 ENCOUNTER — Telehealth: Payer: Self-pay

## 2012-10-17 NOTE — Telephone Encounter (Signed)
  Follow up Call-  Call back number 10/16/2012 02/23/2011  Post procedure Call Back phone  # [phone] ok mes  Permission to leave phone message Yes -     Patient questions:  Do you have a fever, pain , or abdominal swelling? no Pain Score  0 *  Have you tolerated food without any problems? yes  Have you been able to return to your normal activities? yes  Do you have any questions about your discharge instructions: Diet   no Medications  no Follow up visit  no  Do you have questions or concerns about your Care? no  Actions: * If pain score is 4 or above: No action needed, pain <4.

## 2012-10-23 ENCOUNTER — Other Ambulatory Visit: Payer: Self-pay

## 2013-01-06 ENCOUNTER — Telehealth (INDEPENDENT_AMBULATORY_CARE_PROVIDER_SITE_OTHER): Payer: Self-pay | Admitting: General Surgery

## 2013-01-06 NOTE — Telephone Encounter (Signed)
Patient called and wanted to know if Dr Daphine Deutscher would do labs and maybe a CT on her before her office visit on 01-17-13. Patient had a lab nissen and she states that she is not feeling well. Please call patient back on her cell [phone]

## 2013-01-06 NOTE — Telephone Encounter (Signed)
LMOM letting pt know that I am unsure if Dr. Daphine Deutscher wants her to have any labs or scans done prior to her next office visit.  Also said that if scans/labs need to be done, they would most likely be determined at next visit.

## 2013-01-17 ENCOUNTER — Encounter (INDEPENDENT_AMBULATORY_CARE_PROVIDER_SITE_OTHER): Payer: Self-pay | Admitting: Surgery

## 2013-01-17 ENCOUNTER — Ambulatory Visit (INDEPENDENT_AMBULATORY_CARE_PROVIDER_SITE_OTHER): Payer: Medicare Other | Admitting: Surgery

## 2013-01-17 VITALS — BP 122/88 | HR 72 | Temp 97.6°F | Resp 16 | Ht 65.0 in | Wt 166.4 lb

## 2013-01-17 DIAGNOSIS — K219 Gastro-esophageal reflux disease without esophagitis: Secondary | ICD-10-CM

## 2013-01-17 NOTE — Progress Notes (Signed)
Stephanie Bell 72 y.o.  Body mass index is 27.69 kg/(m^2).  Patient Active Problem List   Diagnosis Date Noted  . Sebaceous cyst of breast 12/20/2011  . Breast cancer 12/13/2011  . S/P Nissen fundoplication (without gastrostomy tube) procedure 06/08/2011    Allergies  Allergen Reactions  . Sulfa Antibiotics Swelling and Rash    Past Surgical History  Procedure Laterality Date  . Vaginal hysterectomy    . Septoplasty    . Back surgery  05-09-11    x 2 lumbar  . Cholecystectomy  05-09-11    laparoscopic  . Tonsillectomy  05-09-11    Tonsillectomy  . Kidney stone surgery  05-09-11    past hx. and cystoscopy for bladder issues  . Laparoscopic nissen fundoplication  05/16/2011    Procedure: LAPAROSCOPIC NISSEN FUNDOPLICATION;  Surgeon: Valarie Merino, MD;  Location: [facility];  Service: General;  Laterality: N/A;  will also remove sebaceous cyst of chest wall  . Irrigation and debridement sebaceous cyst  05/16/2011    Procedure: IRRIGATION AND DEBRIDEMENT SEBACEOUS CYST;  Surgeon: Valarie Merino, MD;  Location: [facility];  Service: General;  Laterality: N/A;  middle of chest on sternum  . Umbilical hernia repair  05/16/2011    Procedure: HERNIA REPAIR UMBILICAL ADULT;  Surgeon: Valarie Merino, MD;  Location: [facility];  Service: General;  Laterality: N/A;  with mesh   . Hernia repair  05/16/2011    hiatal hernia  . Colonoscopy  2002, 2008     rectal hyperplastic polyp 2002, diverticulosis 2008  . Laparoscopic nissen fundoplication  02/09/2012    Procedure: LAPAROSCOPIC NISSEN FUNDOPLICATION;  Surgeon: Valarie Merino, MD;  Location: [facility];  Service: General;  Laterality: N/A;  Re-do Laparoscopic Nissen   . Hiatal hernia repair  02/09/2012    Procedure: LAPAROSCOPIC REPAIR OF HIATAL HERNIA;  Surgeon: Valarie Merino, MD;  [facility]: [facility];  Service: General;  Laterality: N/A;  . Insertion of mesh  02/09/2012    Procedure: INSERTION OF MESH;  Surgeon: Valarie Merino, MD;  Location: [facility];  Service: Windell Moment, MD 1. GERD (gastroesophageal reflux disease)     Stephanie Bell returns today in followup and has managed to keep off probably 40 pounds over 60 that she lost after surgery. She looks really good and she's back playing some tenderness. She does have some redundant skin of her lower abdomen were her pannus is lying down and irritating decrease. This has to do with her weight loss. I suggested that she come in and when it's irritated habit photo documented and also have her primary care doctor manage this document as well and it is such a problem that we could request Medicare coverage for this.  She wakes up and about 2 or 3:00 and has to burp and has reflux it sounds like. She requested an upper GI grade with going ahead and doing that C. If she has more of a hiatal hernia. A month to see her back in about 3 months anyway gold when you see upper GI she was a looks like. Overall she seemed pleased. Her husband [name] is getting along well after his ventral hernia back playing tennis. I told her be okay to be playing tennis but did not play aggressive competitive tennis as I'm afraid she might reinjured herself. I could include her diaphragm but also her back. Since she has lost weight her back is much better. [name], MD, FACS  [facility] [phone] beeper [phone]  01/17/2013 10:13 AM

## 2013-01-17 NOTE — Patient Instructions (Signed)
Take Prilosec at bedtime to see if that prevents you from awakening with heartburn

## 2013-01-22 ENCOUNTER — Other Ambulatory Visit: Payer: Medicare Other

## 2013-01-23 ENCOUNTER — Other Ambulatory Visit: Payer: Self-pay

## 2013-01-29 ENCOUNTER — Other Ambulatory Visit (INDEPENDENT_AMBULATORY_CARE_PROVIDER_SITE_OTHER): Payer: Self-pay | Admitting: Surgery

## 2013-01-29 ENCOUNTER — Ambulatory Visit
Admission: RE | Admit: 2013-01-29 | Discharge: 2013-01-29 | Disposition: A | Discharge status: Home / Self Care | Payer: Medicare Other | Source: Ambulatory Visit | Attending: Surgery | Admitting: Surgery

## 2013-01-29 DIAGNOSIS — K219 Gastro-esophageal reflux disease without esophagitis: Secondary | ICD-10-CM

## 2013-03-28 ENCOUNTER — Ambulatory Visit (INDEPENDENT_AMBULATORY_CARE_PROVIDER_SITE_OTHER): Payer: Medicare Other | Admitting: Surgery

## 2013-03-28 VITALS — BP 128/72 | HR 78 | Temp 98.0°F | Resp 18 | Ht 65.0 in | Wt 175.0 lb

## 2013-03-28 DIAGNOSIS — Z09 Encounter for follow-up examination after completed treatment for conditions other than malignant neoplasm: Secondary | ICD-10-CM

## 2013-03-28 DIAGNOSIS — Z9889 Other specified postprocedural states: Secondary | ICD-10-CM

## 2013-03-28 NOTE — Patient Instructions (Signed)
Thanks for your patience.  If you need further assistance after leaving the office, please call our office and speak with a [facility] nurse.  [phone].  If you want to leave a message for Dr. Larrell Rapozo, please call his office phone at [phone]. 

## 2013-03-28 NOTE — Progress Notes (Signed)
Stephanie Bell 73 y.o.  Body mass index is 29.12 kg/(m^2).  Patient Active Problem List   Diagnosis Date Noted  . Sebaceous cyst of breast 12/20/2011  . Breast cancer 12/13/2011  . S/P Nissen fundoplication (without gastrostomy tube) procedure 06/08/2011    Allergies  Allergen Reactions  . Sulfa Antibiotics Swelling and Rash    Past Surgical History  Procedure Laterality Date  . Vaginal hysterectomy    . Septoplasty    . Back surgery  05-09-11    x 2 lumbar  . Cholecystectomy  05-09-11    laparoscopic  . Tonsillectomy  05-09-11    Tonsillectomy  . Kidney stone surgery  05-09-11    past hx. and cystoscopy for bladder issues  . Laparoscopic nissen fundoplication  06/26/8117    Procedure: LAPAROSCOPIC NISSEN FUNDOPLICATION;  Surgeon: Pedro Earls, MD;  [facility]: [facility];  Service: General;  Laterality: N/A;  will also remove sebaceous cyst of chest wall  . Irrigation and debridement sebaceous cyst  05/16/2011    Procedure: IRRIGATION AND DEBRIDEMENT SEBACEOUS CYST;  Surgeon: Pedro Earls, MD;  [facility]: [facility];  Service: General;  Laterality: N/A;  middle of chest on sternum  . Umbilical hernia repair  05/16/2011    Procedure: HERNIA REPAIR UMBILICAL ADULT;  Surgeon: Pedro Earls, MD;  [facility]: [facility];  Service: General;  Laterality: N/A;  with mesh   . Hernia repair  05/16/2011    hiatal hernia  . Colonoscopy  2002, 2008     rectal hyperplastic polyp 2002, diverticulosis 2008  . Laparoscopic nissen fundoplication  14/78/2956    Procedure: LAPAROSCOPIC NISSEN FUNDOPLICATION;  Surgeon: Pedro Earls, MD;  [facility]: [facility];  Service: General;  Laterality: N/A;  Re-do Laparoscopic Nissen   . Hiatal hernia repair  02/09/2012    Procedure: LAPAROSCOPIC REPAIR OF HIATAL HERNIA;  Surgeon: Pedro Earls, MD;  [facility];  Service: General;  Laterality: N/A;  . Insertion of mesh  02/09/2012    Procedure: INSERTION OF MESH;  Surgeon: Pedro Earls, MD;  [facility];  Service: Myrtie Cruise, MD No diagnosis found.  Doing well.  Occasionally she will have 2 am belching.  Advised to take Tums at bedtime.  Will see prn.  She is back to playing tennis and art.  She has regained 30 of the 60 lbs that she had lost.    Matt B. Hassell Done, MD, [facility] [phone] beeper [phone]  03/28/2013 4:05 PM

## 2013-05-01 DIAGNOSIS — M79643 Pain in unspecified hand: Secondary | ICD-10-CM | POA: Insufficient documentation

## 2013-06-02 DIAGNOSIS — M19049 Primary osteoarthritis, unspecified hand: Secondary | ICD-10-CM | POA: Insufficient documentation

## 2013-06-29 ENCOUNTER — Emergency Department (HOSPITAL_COMMUNITY): Payer: Medicare Other

## 2013-06-29 ENCOUNTER — Emergency Department (HOSPITAL_COMMUNITY)
Admission: EM | Admit: 2013-06-29 | Discharge: 2013-06-29 | Disposition: A | Discharge status: Home / Self Care | Payer: Medicare Other | Attending: Emergency Medicine | Admitting: Emergency Medicine

## 2013-06-29 ENCOUNTER — Encounter (HOSPITAL_COMMUNITY): Payer: Self-pay | Admitting: Emergency Medicine

## 2013-06-29 DIAGNOSIS — R109 Unspecified abdominal pain: Secondary | ICD-10-CM | POA: Insufficient documentation

## 2013-06-29 DIAGNOSIS — N1330 Unspecified hydronephrosis: Secondary | ICD-10-CM

## 2013-06-29 DIAGNOSIS — N3010 Interstitial cystitis (chronic) without hematuria: Secondary | ICD-10-CM | POA: Insufficient documentation

## 2013-06-29 DIAGNOSIS — Z87442 Personal history of urinary calculi: Secondary | ICD-10-CM | POA: Insufficient documentation

## 2013-06-29 DIAGNOSIS — N23 Unspecified renal colic: Secondary | ICD-10-CM

## 2013-06-29 DIAGNOSIS — Z79899 Other long term (current) drug therapy: Secondary | ICD-10-CM | POA: Insufficient documentation

## 2013-06-29 DIAGNOSIS — Z7982 Long term (current) use of aspirin: Secondary | ICD-10-CM | POA: Insufficient documentation

## 2013-06-29 DIAGNOSIS — Z8719 Personal history of other diseases of the digestive system: Secondary | ICD-10-CM | POA: Insufficient documentation

## 2013-06-29 DIAGNOSIS — R110 Nausea: Secondary | ICD-10-CM | POA: Insufficient documentation

## 2013-06-29 LAB — URINALYSIS, ROUTINE W REFLEX MICROSCOPIC
BILIRUBIN URINE: NEGATIVE
Glucose, UA: NEGATIVE mg/dL
HGB URINE DIPSTICK: NEGATIVE
KETONES UR: NEGATIVE mg/dL
Leukocytes, UA: NEGATIVE
Nitrite: NEGATIVE
Protein, ur: NEGATIVE mg/dL
SPECIFIC GRAVITY, URINE: 1.006 (ref 1.005–1.030)
UROBILINOGEN UA: 0.2 mg/dL (ref 0.0–1.0)
pH: 6.5 (ref 5.0–8.0)

## 2013-06-29 LAB — COMPREHENSIVE METABOLIC PANEL
ALK PHOS: 87 U/L (ref 39–117)
ALT: 11 U/L (ref 0–35)
AST: 17 U/L (ref 0–37)
Albumin: 4.1 g/dL (ref 3.5–5.2)
BUN: 12 mg/dL (ref 6–23)
CALCIUM: 9.9 mg/dL (ref 8.4–10.5)
CHLORIDE: 101 meq/L (ref 96–112)
CO2: 28 meq/L (ref 19–32)
Creatinine, Ser: 0.78 mg/dL (ref 0.50–1.10)
GFR calc Af Amer: >90 mL/min (ref >90)
GFR, EST NON AFRICAN AMERICAN: 81 mL/min — AB (ref >90)
Glucose, Bld: 95 mg/dL (ref 70–99)
POTASSIUM: 4.2 meq/L (ref 3.7–5.3)
SODIUM: 141 meq/L (ref 137–147)
Total Bilirubin: 0.8 mg/dL (ref 0.3–1.2)
Total Protein: 7.4 g/dL (ref 6.0–8.3)

## 2013-06-29 LAB — CBC WITH DIFFERENTIAL/PLATELET
BASOS ABS: 0 10*3/uL (ref 0.0–0.1)
Basophils Relative: 0 % (ref 0–1)
EOS PCT: 2 % (ref 0–5)
Eosinophils Absolute: 0.1 10*3/uL (ref 0.0–0.7)
HCT: 41.1 % (ref 36.0–46.0)
Hemoglobin: 13.9 g/dL (ref 12.0–15.0)
LYMPHS ABS: 1.7 10*3/uL (ref 0.7–4.0)
LYMPHS PCT: 26 % (ref 12–46)
MCH: 28.5 pg (ref 26.0–34.0)
MCHC: 33.8 g/dL (ref 30.0–36.0)
MCV: 84.2 fL (ref 78.0–100.0)
Monocytes Absolute: 0.6 10*3/uL (ref 0.1–1.0)
Monocytes Relative: 10 % (ref 3–12)
NEUTROS ABS: 3.9 10*3/uL (ref 1.7–7.7)
NEUTROS PCT: 62 % (ref 43–77)
PLATELETS: 173 10*3/uL (ref 150–400)
RBC: 4.88 MIL/uL (ref 3.87–5.11)
RDW: 13 % (ref 11.5–15.5)
WBC: 6.4 10*3/uL (ref 4.0–10.5)

## 2013-06-29 LAB — LIPASE, BLOOD: Lipase: 23 U/L (ref 11–59)

## 2013-06-29 MED ORDER — SODIUM CHLORIDE 0.9 % IV BOLUS (SEPSIS)
1000.0000 mL | Freq: Once | INTRAVENOUS | Status: AC
  Administered 2013-06-29: 1000 mL via INTRAVENOUS

## 2013-06-29 MED ORDER — ONDANSETRON 4 MG PO TBDP: ORAL_TABLET | ORAL | Status: DC

## 2013-06-29 MED ORDER — TRAMADOL HCL 50 MG PO TABS
50.0000 mg | ORAL_TABLET | Freq: Once | ORAL | Status: AC
  Administered 2013-06-29: 50 mg via ORAL
  Filled 2013-06-29: qty 1

## 2013-06-29 MED ORDER — TRAMADOL HCL 50 MG PO TABS: 50.0000 mg | ORAL_TABLET | Freq: Four times a day (QID) | ORAL | Status: DC | PRN

## 2013-06-29 MED ORDER — ONDANSETRON HCL 4 MG/2ML IJ SOLN
4.0000 mg | Freq: Once | INTRAMUSCULAR | Status: AC
  Administered 2013-06-29: 4 mg via INTRAVENOUS
  Filled 2013-06-29: qty 2

## 2013-06-29 MED ORDER — MORPHINE SULFATE 4 MG/ML IJ SOLN: 4.0000 mg | Freq: Once | INTRAMUSCULAR | Status: DC

## 2013-06-29 NOTE — ED Notes (Signed)
Pt complains of right sided flank pain for two days, no vomiting but very nauseated, hx of kidney stones years ago

## 2013-06-29 NOTE — Discharge Instructions (Signed)
Take tylenol or motrin for pain.   For severe pain, take tramadol as prescribed.   Take zofran for nausea.   Follow up with your urologist.   Return to ER if you have severe pain, vomiting.

## 2013-06-29 NOTE — ED Provider Notes (Signed)
CSN: [id number]     Arrival date & time 06/29/13  1931 History   First MD Initiated Contact with Patient 06/29/13 2037     Chief Complaint  Patient presents with  . Flank Pain     (Consider location/radiation/quality/duration/timing/severity/associated sxs/prior Treatment) The history is provided by the patient.  HAJA CREGO is a 73 y.o. female hx of interstitial cystitis, hysterectomy, kidney stone, cholecystectomy here with R flank pain. Intermittent right flank pain radiate down to right lower quadrant the last 2-3 days. Pain is worse today. Feels nauseated but didn't vomit. She states that some of her previous kidney stone . Denies any blood in her urine, but she did have some bladder spasm and took nitrofurantoin. Denies any constipation or diarrhea.    Past Medical History  Diagnosis Date  . Interstitial cystitis   . Hx: UTI (urinary tract infection)   . Weight loss   . Wound disruption, post-op, skin   . Pneumonia 05-09-11    2011 last time  . Paraesophageal hernia   . GERD (gastroesophageal reflux disease)   . H/O hiatal hernia    Past Surgical History  Procedure Laterality Date  . Vaginal hysterectomy    . Septoplasty    . Back surgery  05-09-11    x 2 lumbar  . Cholecystectomy  05-09-11    laparoscopic  . Tonsillectomy  05-09-11    Tonsillectomy  . Kidney stone surgery  05-09-11    past hx. and cystoscopy for bladder issues  . Laparoscopic nissen fundoplication  6/60/6301    Procedure: LAPAROSCOPIC NISSEN FUNDOPLICATION;  Surgeon: Pedro Earls, MD;  [facility]: [facility];  Service: General;  Laterality: N/A;  will also remove sebaceous cyst of chest wall  . Irrigation and debridement sebaceous cyst  05/16/2011    Procedure: IRRIGATION AND DEBRIDEMENT SEBACEOUS CYST;  Surgeon: Pedro Earls, MD;  [facility]: [facility];  Service: General;  Laterality: N/A;  middle of chest on sternum  . Umbilical hernia repair  05/16/2011    Procedure: HERNIA REPAIR UMBILICAL ADULT;   Surgeon: Pedro Earls, MD;  [facility]: [facility];  Service: General;  Laterality: N/A;  with mesh   . Hernia repair  05/16/2011    hiatal hernia  . Colonoscopy  2002, 2008     rectal hyperplastic polyp 2002, diverticulosis 2008  . Laparoscopic nissen fundoplication  60/12/9321    Procedure: LAPAROSCOPIC NISSEN FUNDOPLICATION;  Surgeon: Pedro Earls, MD;  [facility]: [facility];  Service: General;  Laterality: N/A;  Re-do Laparoscopic Nissen   . Hiatal hernia repair  02/09/2012    Procedure: LAPAROSCOPIC REPAIR OF HIATAL HERNIA;  Surgeon: Pedro Earls, MD;  [facility];  Service: General;  Laterality: N/A;  . Insertion of mesh  02/09/2012    Procedure: INSERTION OF MESH;  Surgeon: Pedro Earls, MD;  [facility]: [facility];  Service: General;;   Family History  Problem Relation Age of Onset  . Colon cancer Neg Hx   . Ulcers Paternal Grandmother   . Ulcers Father   . Alcohol abuse Father    History  Substance Use Topics  . Smoking status: Never Smoker   . Smokeless tobacco: Never Used  . Alcohol Use: 1.8 oz/week    3 Glasses of wine per week   OB History   Grav Para Term Preterm Abortions TAB SAB Ect Mult Living                 Review of Systems  Genitourinary:  Positive for flank pain.  All other systems reviewed and are negative.     Allergies  Sulfa antibiotics  Home Medications   Current Outpatient Rx  Name  Route  Sig  Dispense  Refill  . aspirin 81 MG tablet   Oral   Take 81 mg by mouth daily.         . cholecalciferol (VITAMIN D) 1000 UNITS tablet   Oral   Take 2,000 Units by mouth daily.          . methylphenidate (RITALIN) 10 MG tablet   Oral   Take 10 mg by mouth 2 (two) times daily.         . Multiple Vitamin (MULTIVITAMIN) tablet   Oral   Take 1 tablet by mouth daily.         . nitrofurantoin (MACRODANTIN) 100 MG capsule   Oral   Take 100 mg by mouth 4 (four) times daily.         . Saline 0.65 % (SOLN) SOLN   Nasal   Place 1  spray into the nose daily as needed. For nasal congestion         . simethicone (MYLICON) 80 MG chewable tablet   Oral   Chew 80 mg by mouth as needed.           BP 139/88  Pulse 70  Temp(Src) 98.3 F (36.8 C) (Oral)  Resp 20  SpO2 97% Physical Exam  Nursing note and vitals reviewed. Constitutional: She is oriented to person, place, and time.  Slightly uncomfortable   HENT:  Head: Normocephalic.  Mouth/Throat: Oropharynx is clear and moist.  Eyes: Conjunctivae are normal. Pupils are equal, round, and reactive to light.  Neck: Normal range of motion. Neck supple.  Cardiovascular: Normal rate, regular rhythm and normal heart sounds.   Pulmonary/Chest: Effort normal and breath sounds normal. No respiratory distress. She has no wheezes. She has no rales.  Abdominal: Soft. Bowel sounds are normal. She exhibits no distension. There is no tenderness. There is no rebound.  + R CVAT   Musculoskeletal: Normal range of motion. She exhibits no edema and no tenderness.  Neurological: She is alert and oriented to person, place, and time. No cranial nerve deficit. Coordination normal.  Skin: Skin is warm and dry.  Psychiatric: She has a normal mood and affect. Her behavior is normal. Judgment and thought content normal.    ED Course  Procedures (including critical care time) Labs Review Labs Reviewed  COMPREHENSIVE METABOLIC PANEL - Abnormal; Notable for the following:    GFR calc non Af Amer 81 (*)    All other components within normal limits  URINALYSIS, ROUTINE W REFLEX MICROSCOPIC - Abnormal; Notable for the following:    APPearance CLOUDY (*)    All other components within normal limits  CBC WITH DIFFERENTIAL  LIPASE, BLOOD   Imaging Review Ct Abdomen Pelvis Wo Contrast  06/29/2013   CLINICAL DATA:  Right flank pain with nausea. History of renal calculi.  EXAM: CT ABDOMEN AND PELVIS WITHOUT CONTRAST  TECHNIQUE: Multidetector CT imaging of the abdomen and pelvis was performed  following the standard protocol without IV contrast.  COMPARISON:  CT ABD W/CM dated 11/23/2011; CT ABD-PEL WO/W CM dated 10/24/2011  FINDINGS: There is evidence of mild dilatation of the right renal pelvis and proximal to mid right ureter. As the ureter is followed into the pelvis, no obstructing calculus is identified. A calcification in the right posterior pelvis lies outside of  the ureteral lumen and represents a stable phlebolith when compared to prior studies. No calculi are identified in the bladder or kidneys.  The gallbladder has been removed. Postsurgical changes are also seen at the GE junction related to Nissen fundoplication. Unenhanced appearance of the solid organs of the abdomen and bowel are unremarkable. No abnormal fluid collections are seen. No focal masses or enlarged lymph nodes are identified. Degenerative disc disease of the lower lumbar spine present with mild anterolisthesis of L4 on L5.  IMPRESSION: Mild dilatation of the right renal pelvis and right ureter without visible ureteral or bladder calculus.   Electronically Signed   By: Aletta Edouard M.D.   On: 06/29/2013 22:05     EKG Interpretation None      MDM   Final diagnoses:  None   CHARON SMEDBERG is a 73 y.o. female hx of kidney stone here with R flank pain. Likely renal colic. Will check labs, UA, CT ab/pel. Will reassess.   10:41 PM CT showed R hydro, no visualized calculus. Wants tramadol as she doesn't tolerate many pain meds in the past. Will give zofran for nausea. She has urology f/u. Will d/c home.    Wandra Arthurs, MD 06/29/13 [phone]

## 2013-09-24 ENCOUNTER — Telehealth (INDEPENDENT_AMBULATORY_CARE_PROVIDER_SITE_OTHER): Payer: Self-pay

## 2013-09-24 NOTE — Telephone Encounter (Signed)
Pt s/p Nissen fundoplication with Dr Hassell Done. Pt states since Monday 09/22/13, she has not been able to eat. Pt has had nausea and feels like she could throw up, but not able to since she has had her sx. Pt denies any fevers or chills. Pt states that she would like to come in to see Dr Hassell Done soon if that was possible. Informed pt that I would send Dr Hassell Done and his assistant a message and we would contact her as soon as we receive a response. Pt verbalized understanding and agrees with POC.

## 2013-09-29 ENCOUNTER — Encounter (HOSPITAL_COMMUNITY): Payer: Self-pay | Admitting: Emergency Medicine

## 2013-09-29 ENCOUNTER — Emergency Department (HOSPITAL_COMMUNITY)
Admission: EM | Admit: 2013-09-29 | Discharge: 2013-09-29 | Disposition: A | Discharge status: Home / Self Care | Payer: Medicare Other | Attending: Emergency Medicine | Admitting: Emergency Medicine

## 2013-09-29 ENCOUNTER — Emergency Department (HOSPITAL_COMMUNITY): Payer: Medicare Other

## 2013-09-29 DIAGNOSIS — Z8701 Personal history of pneumonia (recurrent): Secondary | ICD-10-CM | POA: Insufficient documentation

## 2013-09-29 DIAGNOSIS — R05 Cough: Secondary | ICD-10-CM

## 2013-09-29 DIAGNOSIS — Z7982 Long term (current) use of aspirin: Secondary | ICD-10-CM | POA: Insufficient documentation

## 2013-09-29 DIAGNOSIS — Z872 Personal history of diseases of the skin and subcutaneous tissue: Secondary | ICD-10-CM | POA: Insufficient documentation

## 2013-09-29 DIAGNOSIS — Z8719 Personal history of other diseases of the digestive system: Secondary | ICD-10-CM | POA: Insufficient documentation

## 2013-09-29 DIAGNOSIS — Z87442 Personal history of urinary calculi: Secondary | ICD-10-CM | POA: Insufficient documentation

## 2013-09-29 DIAGNOSIS — R059 Cough, unspecified: Secondary | ICD-10-CM | POA: Insufficient documentation

## 2013-09-29 DIAGNOSIS — R071 Chest pain on breathing: Secondary | ICD-10-CM | POA: Insufficient documentation

## 2013-09-29 DIAGNOSIS — R0789 Other chest pain: Secondary | ICD-10-CM

## 2013-09-29 DIAGNOSIS — Z79899 Other long term (current) drug therapy: Secondary | ICD-10-CM | POA: Insufficient documentation

## 2013-09-29 DIAGNOSIS — Z87440 Personal history of urinary (tract) infections: Secondary | ICD-10-CM | POA: Insufficient documentation

## 2013-09-29 HISTORY — DX: Calculus of kidney: N20.0

## 2013-09-29 LAB — I-STAT TROPONIN, ED: Troponin i, poc: 0.01 ng/mL (ref 0.00–0.08)

## 2013-09-29 LAB — CBC
HCT: 38.2 % (ref 36.0–46.0)
Hemoglobin: 13.2 g/dL (ref 12.0–15.0)
MCH: 28.8 pg (ref 26.0–34.0)
MCHC: 34.6 g/dL (ref 30.0–36.0)
MCV: 83.4 fL (ref 78.0–100.0)
Platelets: 144 10*3/uL — ABNORMAL LOW (ref 150–400)
RBC: 4.58 MIL/uL (ref 3.87–5.11)
RDW: 13.1 % (ref 11.5–15.5)
WBC: 4.8 10*3/uL (ref 4.0–10.5)

## 2013-09-29 LAB — BASIC METABOLIC PANEL
Anion gap: 12 (ref 5–15)
BUN: 8 mg/dL (ref 6–23)
CO2: 27 mEq/L (ref 19–32)
Calcium: 9.2 mg/dL (ref 8.4–10.5)
Chloride: 102 mEq/L (ref 96–112)
Creatinine, Ser: 0.78 mg/dL (ref 0.50–1.10)
GFR calc Af Amer: >90 mL/min (ref >90)
GFR calc non Af Amer: 80 mL/min — ABNORMAL LOW (ref >90)
Glucose, Bld: 93 mg/dL (ref 70–99)
Potassium: 3.8 mEq/L (ref 3.7–5.3)
Sodium: 141 mEq/L (ref 137–147)

## 2013-09-29 LAB — D-DIMER, QUANTITATIVE: D-Dimer, Quant: 0.4 ug/mL-FEU (ref 0.00–0.48)

## 2013-09-29 NOTE — ED Notes (Signed)
Pt states returned from beach last week b/c not feeling well.  Had noted pain below right breast.  Worse with breathing.  Pt states she has had productive cough.  States no fever, but her temp goes lower when sick.  Pt states no upper chest discomfort or pain down either arm.

## 2013-09-29 NOTE — ED Provider Notes (Signed)
CSN: [id number]     Arrival date & time 09/29/13  1141 History   First MD Initiated Contact with Patient 09/29/13 1235     Chief Complaint  Patient presents with  . Cough  . Chest Pain     (Consider location/radiation/quality/duration/timing/severity/associated sxs/prior Treatment) HPI 73 year old female presents with cough and chest pain on the right side of the last several days. She states she was at the beach when this started. She became very concerned because she's been told not to cough or vomit due to recurrent hiatal hernias that have been operated on multiple times. The patient does not feel nauseous, has not had vomiting, or abdominal pain or any symptoms similar to when she's had recurrent hiatal hernias. The right lower chest pain is over her lowest rib it feels worse with palpation, deep penetration, or cough. She did come back early from her trip because her PCP called her in chronic cough syrup but cannot recall them. Her cough has decreased significantly since then. No leg swelling or hemoptysis.  Past Medical History  Diagnosis Date  . Interstitial cystitis   . Hx: UTI (urinary tract infection)   . Weight loss   . Wound disruption, post-op, skin   . Pneumonia 05-09-11    2011 last time  . Paraesophageal hernia   . GERD (gastroesophageal reflux disease)   . H/O hiatal hernia   . Kidney stone    Past Surgical History  Procedure Laterality Date  . Vaginal hysterectomy    . Septoplasty    . Back surgery  05-09-11    x 2 lumbar  . Cholecystectomy  05-09-11    laparoscopic  . Tonsillectomy  05-09-11    Tonsillectomy  . Kidney stone surgery  05-09-11    past hx. and cystoscopy for bladder issues  . Laparoscopic nissen fundoplication  9/62/8366    Procedure: LAPAROSCOPIC NISSEN FUNDOPLICATION;  Surgeon: Pedro Earls, MD;  Location: [facility];  Service: General;  Laterality: N/A;  will also remove sebaceous cyst of chest wall  . Irrigation and debridement sebaceous cyst   05/16/2011    Procedure: IRRIGATION AND DEBRIDEMENT SEBACEOUS CYST;  Surgeon: Pedro Earls, MD;  [facility]: [facility];  Service: General;  Laterality: N/A;  middle of chest on sternum  . Umbilical hernia repair  05/16/2011    Procedure: HERNIA REPAIR UMBILICAL ADULT;  Surgeon: Pedro Earls, MD;  Location: [facility];  Service: General;  Laterality: N/A;  with mesh   . Hernia repair  05/16/2011    hiatal hernia  . Colonoscopy  2002, 2008     rectal hyperplastic polyp 2002, diverticulosis 2008  . Laparoscopic nissen fundoplication  29/47/6546    Procedure: LAPAROSCOPIC NISSEN FUNDOPLICATION;  Surgeon: Pedro Earls, MD;  [facility]: [facility];  Service: General;  Laterality: N/A;  Re-do Laparoscopic Nissen   . Hiatal hernia repair  02/09/2012    Procedure: LAPAROSCOPIC REPAIR OF HIATAL HERNIA;  Surgeon: Pedro Earls, MD;  [facility];  Service: General;  Laterality: N/A;  . Insertion of mesh  02/09/2012    Procedure: INSERTION OF MESH;  Surgeon: Pedro Earls, MD;  [facility]: [facility];  Service: General;;   Family History  Problem Relation Age of Onset  . Colon cancer Neg Hx   . Ulcers Paternal Grandmother   . Ulcers Father   . Alcohol abuse Father    History  Substance Use Topics  . Smoking status: Never Smoker   . Smokeless tobacco: Never Used  .  Alcohol Use: 1.8 oz/week    3 Glasses of wine per week   OB History   Grav Para Term Preterm Abortions TAB SAB Ect Mult Living                 Review of Systems  Respiratory: Positive for cough. Negative for shortness of breath.   Cardiovascular: Positive for chest pain. Negative for leg swelling.  Gastrointestinal: Negative for nausea, vomiting and abdominal pain.  All other systems reviewed and are negative.     Allergies  Sulfa antibiotics  Home Medications   Prior to Admission medications   Medication Sig Start Date End Date Taking? Authorizing Provider  aspirin 81 MG tablet Take 81 mg by mouth daily.   Yes  Historical Provider, MD  Cholecalciferol (VITAMIN D-3) 5000 UNITS TABS Take 20,000 Units by mouth daily.   Yes Historical Provider, MD  HYDROcodone-homatropine (HYCODAN) 5-1.5 MG/5ML syrup Take 5 mLs by mouth every 6 (six) hours as needed for cough.   Yes Historical Provider, MD  methylphenidate (RITALIN) 10 MG tablet Take 10 mg by mouth 2 (two) times daily as needed (traveling.).    Yes Historical Provider, MD  Multiple Vitamin (MULTIVITAMIN) tablet Take 1 tablet by mouth daily.   Yes Historical Provider, MD  nitrofurantoin (MACRODANTIN) 100 MG capsule Take 100 mg by mouth daily as needed (burning.).    Yes Historical Provider, MD  promethazine (PHENERGAN) 25 MG suppository Place 25 mg rectally every 6 (six) hours as needed for nausea or vomiting.   Yes Historical Provider, MD  Saline 0.65 % (SOLN) SOLN Place 1 spray into the nose daily as needed. For nasal congestion   Yes Historical Provider, MD  simethicone (MYLICON) 80 MG chewable tablet Chew 80 mg by mouth as needed.    Yes Historical Provider, MD   BP 135/77  Pulse 69  Temp(Src) 98 F (36.7 C) (Oral)  Resp 16  SpO2 97% Physical Exam  Nursing note and vitals reviewed. Constitutional: She is oriented to person, place, and time. She appears well-developed and well-nourished. No distress.  HENT:  Head: Normocephalic and atraumatic.  Right Ear: External ear normal.  Left Ear: External ear normal.  Nose: Nose normal.  Eyes: Right eye exhibits no discharge. Left eye exhibits no discharge.  Cardiovascular: Normal rate, regular rhythm and normal heart sounds.   Pulmonary/Chest: Effort normal and breath sounds normal. She exhibits tenderness.    Abdominal: Soft. She exhibits no distension. There is no tenderness.  Musculoskeletal: She exhibits no edema.  Neurological: She is alert and oriented to person, place, and time.  Skin: Skin is warm and dry.    ED Course  Procedures (including critical care time) Labs Review Labs Reviewed    CBC - Abnormal; Notable for the following:    Platelets 144 (*)    All other components within normal limits  BASIC METABOLIC PANEL - Abnormal; Notable for the following:    GFR calc non Af Amer 80 (*)    All other components within normal limits  D-DIMER, QUANTITATIVE  I-STAT TROPOININ, ED    Imaging Review Dg Chest 2 View  09/29/2013   CLINICAL DATA:  Cough and chest pain with difficulty swallowing  EXAM: CHEST  2 VIEW  COMPARISON:  PA and lateral chest of January 07, 2012  FINDINGS: The lungs are mildly hyperinflated with hemidiaphragm flattening. There is no pleural effusion nor pneumonia. The heart and pulmonary vascularity are normal. There is mild tortuosity of the descending thoracic aorta. There  are surgical clips in the region of the GE junction. The bony thorax is unremarkable.  IMPRESSION: COPD. No acute cardiopulmonary or acute mediastinal abnormality is demonstrated.   Electronically Signed   By: David  Martinique   On: 09/29/2013 12:35     EKG Interpretation   Date/Time:  Monday September 29 2013 11:57:52 EDT Ventricular Rate:  70 PR Interval:  185 QRS Duration: 86 QT Interval:  397 QTC Calculation: 428 R Axis:   7 Text Interpretation:  Sinus rhythm Low voltage, precordial leads  Borderline T wave abnormalities No significant change since 2013 Confirmed  by Galileah Piggee  MD, Urian Martenson ([id number]) on 09/29/2013 12:39:12 PM      MDM   Final diagnoses:  Cough  Chest wall pain    Patient's exam is benign. She has right inferior rib tenderness. No significant bruising. Likely a strain from her coughing. There is no evidence of pneumonia. She is low risk for pulmonary embolism, and has a negative d-dimer. I have very low suspicion for PE at this time. She is showing no signs of having recurrent hiatal hernia issues. This is not consistent with ACS. She declines pain medicine in the ED. At this point I feel she is stable for discharge.    Ephraim Hamburger, MD 09/29/13 [phone]

## 2013-09-29 NOTE — Discharge Instructions (Signed)

## 2013-09-29 NOTE — ED Notes (Signed)
Pt states the doctor has been in and she feels much better since hearing good news, states she was scared she has pneumonia or ruptured her hiatal hernia, states since she has neither she feels better.

## 2013-10-02 ENCOUNTER — Telehealth (INDEPENDENT_AMBULATORY_CARE_PROVIDER_SITE_OTHER): Payer: Self-pay

## 2013-10-02 NOTE — Telephone Encounter (Signed)
Follow up call to patient-  Patient reports she was out of town when she had difficulty swallowing foods.  Patient denies having any nausea/vomiting or fever.  Patient reports that she's back home now.  Patient states she starting having a coughing spell while at the beach.  Patient did not have her pain or cough medication with her.  Patient came home and was seen by her PCP.  Patient states she was hurting in her lower right lung and advised to go to Urgent Care per her General Practioner.  Patient states her imaging and lab results were all normal.  Patient states she's been playing tennis and has done very well.  Patient was seen in the ED for chest pain and cough.  Patient reports that she's feeling better now.  Patient advised tol call our office if her symptoms return or worsen.  Patient aware that I will forward the message to Dr. Hassell Done. Patient status post Lap Nissen Fundiplication on 58/25/1898.

## 2013-10-02 NOTE — Telephone Encounter (Signed)
Follow up call to patient- Patient reports she was out of town when she had difficulty swallowing foods. Patient denies having any nausea/vomiting or fever. Patient reports that she's back home now. Patient states she starting having a coughing spell while at the beach. Patient did not have her pain or cough medication with her. Patient came home and was seen by her PCP. Patient states she was hurting in her lower right lung and advised to go to Urgent Care per her General Practioner. Patient states her imaging and lab results were all normal. Patient states she's been playing tennis and has done very well. Patient was seen in the ED for chest pain and cough. Patient reports that she's feeling better now. Patient advised tol call our office if her symptoms return or worsen. Patient aware that I will forward the message to Dr. Hassell Done. Patient status post Lap Nissen Fundiplication on 25/63/8937

## 2013-10-02 NOTE — Telephone Encounter (Signed)
Message copied by Ivor Costa on Thu Oct 02, 2013  5:41 PM ------      Message from: Johnathan Hausen B      Created: Thu Oct 02, 2013 11:56 AM      Regarding: please call and check on this patient       Having symptoms of recurrent gERD after Nissen redo. Left message.  I may need to see her.  ------

## 2014-01-02 ENCOUNTER — Other Ambulatory Visit: Payer: Self-pay

## 2014-02-25 ENCOUNTER — Other Ambulatory Visit (INDEPENDENT_AMBULATORY_CARE_PROVIDER_SITE_OTHER): Payer: Self-pay

## 2014-02-25 DIAGNOSIS — R059 Cough, unspecified: Secondary | ICD-10-CM

## 2014-02-25 DIAGNOSIS — Z9889 Other specified postprocedural states: Secondary | ICD-10-CM

## 2014-02-25 DIAGNOSIS — R05 Cough: Secondary | ICD-10-CM

## 2014-02-27 ENCOUNTER — Other Ambulatory Visit (INDEPENDENT_AMBULATORY_CARE_PROVIDER_SITE_OTHER): Payer: Self-pay | Admitting: Surgery

## 2014-02-27 ENCOUNTER — Ambulatory Visit
Admission: RE | Admit: 2014-02-27 | Discharge: 2014-02-27 | Disposition: A | Discharge status: Home / Self Care | Payer: Medicare Other | Source: Ambulatory Visit | Attending: Surgery | Admitting: Surgery

## 2014-02-27 DIAGNOSIS — Z9889 Other specified postprocedural states: Secondary | ICD-10-CM

## 2014-02-27 DIAGNOSIS — R05 Cough: Secondary | ICD-10-CM

## 2014-02-27 DIAGNOSIS — R059 Cough, unspecified: Secondary | ICD-10-CM

## 2014-03-09 ENCOUNTER — Telehealth (INDEPENDENT_AMBULATORY_CARE_PROVIDER_SITE_OTHER): Payer: Self-pay

## 2014-03-09 NOTE — Telephone Encounter (Signed)
Pt called stating that she needs something for nausea. She had a Lap Nissen fundoplication on 12/10/28. I advised the pt that I would have to send a message seeing as the procedure was so long ago. The pt verbalized understanding. Please advise.

## 2014-08-10 ENCOUNTER — Other Ambulatory Visit (INDEPENDENT_AMBULATORY_CARE_PROVIDER_SITE_OTHER): Payer: Self-pay

## 2014-08-10 DIAGNOSIS — R1310 Dysphagia, unspecified: Secondary | ICD-10-CM

## 2014-08-10 DIAGNOSIS — K222 Esophageal obstruction: Secondary | ICD-10-CM

## 2014-08-10 DIAGNOSIS — Z9889 Other specified postprocedural states: Secondary | ICD-10-CM

## 2014-08-10 DIAGNOSIS — K225 Diverticulum of esophagus, acquired: Secondary | ICD-10-CM

## 2014-08-12 ENCOUNTER — Ambulatory Visit
Admission: RE | Admit: 2014-08-12 | Discharge: 2014-08-12 | Disposition: A | Discharge status: Home / Self Care | Payer: Medicare Other | Source: Ambulatory Visit | Attending: Surgery | Admitting: Surgery

## 2014-08-12 ENCOUNTER — Other Ambulatory Visit: Payer: Self-pay

## 2014-08-13 ENCOUNTER — Telehealth: Payer: Self-pay | Admitting: Internal Medicine

## 2014-08-13 NOTE — Telephone Encounter (Signed)
Patient has been seen by Dr. Hassell Done for worsening dysphagia.  He ordered an BS and she has esophageal stricture.  She will come in and see Tye Savoy RNP on 08/20/14.  Jearld Fenton will fax me notes

## 2014-08-14 NOTE — Telephone Encounter (Signed)
I spoke with Stephanie Bell from [facility] she has not been seen with them since 2015.  I will print off the last notes from Dr. Hassell Done

## 2014-08-20 ENCOUNTER — Ambulatory Visit: Payer: Medicare Other | Admitting: Nurse Practitioner

## 2014-08-25 ENCOUNTER — Encounter: Payer: Self-pay | Admitting: Physician Assistant

## 2014-08-25 ENCOUNTER — Ambulatory Visit (INDEPENDENT_AMBULATORY_CARE_PROVIDER_SITE_OTHER): Payer: Medicare Other | Admitting: Physician Assistant

## 2014-08-25 VITALS — BP 104/64 | HR 72 | Ht 65.0 in | Wt 177.0 lb

## 2014-08-25 DIAGNOSIS — K5900 Constipation, unspecified: Secondary | ICD-10-CM | POA: Diagnosis not present

## 2014-08-25 DIAGNOSIS — R1310 Dysphagia, unspecified: Secondary | ICD-10-CM

## 2014-08-25 DIAGNOSIS — R1013 Epigastric pain: Secondary | ICD-10-CM

## 2014-08-25 NOTE — Progress Notes (Signed)
Patient ID: Stephanie Bell, female   DOB: 03-Apr-1940, 74 y.o.   MRN: [id number]     History of Present Illness: Stephanie Bell is a pleasant 74 year old female known to Dr. Carlean Purl. She has a history of paraesophageal hiatal hernia and underwent a Nissen fundoplication in February 2013. Approximately 5 months postoperatively she states she had a coughing fit that "roomed my repair". In November 2013 she had a redo of her Nissen fundoplication with mesh. She has a long-standing history of constipation and small caliber stools. She had a colonoscopy in 2002 when she had a diminutive hyperplastic polyp. Repeat colonoscopy in 2008 showed left-sided diverticulosis. Her most recent colonoscopy was on July 30 of 2014 at which time she was noted to have a normal colon except for a small nonbleeding AVM in the cecum. She was noted to have external and internal hemorrhoids. She also had an EGD on 02/23/2011 which showed a 12 cm hiatal hernia and multiple erosions in the body of stomach related to diaphragmatic impingement.  He is here today because for the past several months she has been having progressive dysphagia to solids and occasionally liquids. She has no heartburn but she has epigastric pain on an empty stomach that is relieved for a short period of time with ingestion of food and then comes back. She also remains quite constipated and states more recently she is skipping 4-7 days between bowel movements. His family if she goes 5 or 6 days without a bowel movement she will take up to call lax and an enema. She has tried [place] lax in the past and says it provides no relief. She has tried milk of magnesia in the past and says it provides no relief.   Past Medical History  Diagnosis Date  . Interstitial cystitis   . Hx: UTI (urinary tract infection)   . Weight loss   . Wound disruption, post-op, skin   . Pneumonia 05-09-11    2011 last time  . Paraesophageal hernia   . GERD (gastroesophageal reflux  disease)   . H/O hiatal hernia   . Kidney stone     Past Surgical History  Procedure Laterality Date  . Vaginal hysterectomy    . Septoplasty    . Back surgery  05-09-11    x 2 lumbar  . Cholecystectomy  05-09-11    laparoscopic  . Tonsillectomy  05-09-11    Tonsillectomy  . Kidney stone surgery  05-09-11    past hx. and cystoscopy for bladder issues  . Laparoscopic nissen fundoplication  5/40/0867    Procedure: LAPAROSCOPIC NISSEN FUNDOPLICATION;  Surgeon: Pedro Earls, MD;  Location: [facility];  Service: General;  Laterality: N/A;  will also remove sebaceous cyst of chest wall  . Irrigation and debridement sebaceous cyst  05/16/2011    Procedure: IRRIGATION AND DEBRIDEMENT SEBACEOUS CYST;  Surgeon: Pedro Earls, MD;  Location: [facility];  Service: General;  Laterality: N/A;  middle of chest on sternum  . Umbilical hernia repair  05/16/2011    Procedure: HERNIA REPAIR UMBILICAL ADULT;  Surgeon: Pedro Earls, MD;  Location: [facility];  Service: General;  Laterality: N/A;  with mesh   . Hernia repair  05/16/2011    hiatal hernia  . Colonoscopy  2002, 2008     rectal hyperplastic polyp 2002, diverticulosis 2008  . Laparoscopic nissen fundoplication  61/95/0932    Procedure: LAPAROSCOPIC NISSEN FUNDOPLICATION;  Surgeon: Pedro Earls, MD;  Location: [facility];  Service: General;  Laterality: N/A;  Re-do Laparoscopic Nissen   . Hiatal hernia repair  02/09/2012    Procedure: LAPAROSCOPIC REPAIR OF HIATAL HERNIA;  Surgeon: Pedro Earls, MD;  Location: [facility];  Service: General;  Laterality: N/A;  . Insertion of mesh  02/09/2012    Procedure: INSERTION OF MESH;  Surgeon: Pedro Earls, MD;  Location: [facility];  Service: General;;   Family History  Problem Relation Age of Onset  . Colon cancer Neg Hx   . Ulcers Paternal Grandmother   . Ulcers Father   . Alcohol abuse Father    History  Substance Use Topics  . Smoking status: Never Smoker   . Smokeless tobacco: Never Used  .  Alcohol Use: 1.8 oz/week    3 Glasses of wine per week   Current Outpatient Prescriptions  Medication Sig Dispense Refill  . aspirin 81 MG tablet Take 81 mg by mouth daily.    . Cholecalciferol (VITAMIN D-3) 5000 UNITS TABS Take 20,000 Units by mouth daily.    . Cranberry-Cholecalciferol 4200-500 MG-UNIT CAPS Take 1 capsule by mouth daily.    Marland Kitchen HYDROcodone-homatropine (HYCODAN) 5-1.5 MG/5ML syrup Take 5 mLs by mouth every 6 (six) hours as needed for cough.    . methylphenidate (RITALIN) 10 MG tablet Take 10 mg by mouth 2 (two) times daily as needed (traveling.).     Marland Kitchen Multiple Vitamin (MULTIVITAMIN) tablet Take 1 tablet by mouth daily.    . ondansetron (ZOFRAN) 4 MG tablet Take 4 mg by mouth every 8 (eight) hours as needed for nausea or vomiting (Patient  only takes as needed.).    Marland Kitchen promethazine (PHENERGAN) 25 MG suppository Place 25 mg rectally every 6 (six) hours as needed for nausea or vomiting.    . Saline 0.65 % (SOLN) SOLN Place 1 spray into the nose daily as needed. For nasal congestion    . simethicone (MYLICON) 80 MG chewable tablet Chew 80 mg by mouth as needed.      No current facility-administered medications for this visit.   Allergies  Allergen Reactions  . Sulfa Antibiotics Swelling and Rash      Review of Systems: Per history of present illness otherwise negative    Studies:   Dg Ugi  W/kub  08/12/2014   CLINICAL DATA:  Feels like food is sticking, difficulty swallowing  EXAM: UPPER GI SERIES WITH KUB  TECHNIQUE: After obtaining a scout radiograph a routine upper GI series was performed using thin barium  FLUOROSCOPY TIME:  Radiation Exposure Index (as provided by the fluoroscopic device): 61 Gy per sq cm  If the device does not provide the exposure index:  Fluoroscopy Time (in minutes and seconds):  2 minutes 12 seconds  Number of Acquired Images:  COMPARISON:  Upper GI of 02/27/2014  FINDINGS: A preliminary film of the abdomen shows no bowel obstruction. Surgical  clips are noted around the gastroesophageal junction. Also clips are present in the right upper quadrant from prior cholecystectomy.  Initially rapid sequence spot films of cervical esophagus were performed. There appears to be a small right lateral pharyngeal diverticulum. There is some narrowing of the lower cervical esophagus on the frontal view. On the lateral view this may be due to a prominent cricopharyngeus muscle as well as some anterior osteophyte formation. Esophageal peristalsis is normal. There does appear to be a small hiatal hernia present with a small portion of the stomach above the hemidiaphragm. This appearance has not changed significantly compared to the upper GI from  02/27/2014. No definite gastroesophageal reflux could be demonstrated. A barium pill was given at the end of the study which did lodge in the distal esophagus just above the small hiatal hernia and did not pass after additional water and barium were given, suggesting a short segment distal esophageal stricture.  The remainder of the stomach is normal in contour and peristalsis. The duodenal bulb fills and the duodenal loop is in normal position.  IMPRESSION: 1. Small hiatal hernia extends above the left hemidiaphragm, not changed significantly compared to the upper GI from 02/27/2014. 2. The barium pill does lodge in the distal esophagus just above the small hiatal hernia indicating a short segment distal esophageal stricture. 3. Some narrowing of the lower cervical esophagus may be due to prominent cricopharyngeus muscle. 4. The remainder the stomach and duodenum are unremarkable. 5. Small right lateral pharyngeal diverticulum.   Electronically Signed   By: Ivar Drape M.D.   On: 08/12/2014 12:08     Physical Exam: General: Pleasant, well developed female in no acute distress Head: Normocephalic and atraumatic Eyes:  sclerae anicteric, conjunctiva pink  Ears: Normal auditory acuity Lungs: Clear throughout to  auscultation Heart: Regular rate and rhythm Abdomen: Soft, non distended, non-tender. No masses, no hepatomegaly. Normal bowel sounds Musculoskeletal: Symmetrical with no gross deformities  Extremities: No edema  Neurological: Alert oriented x 4, grossly nonfocal Psychological:  Alert and cooperative. Normal mood and affect  Assessment and Recommendations: 74 year old female with a history of hiatal hernia, status post Nissen fundoplication 2, now with epigastric pain and dysphagia. Upper GI reveals some narrowing of the lower cervical esophagus possibly due to a prominent cricopharyngeal muscle. It was also noted that the barium tablet lodged in the distal esophagus just above the small hiatal hernia suggestive of a short distal esophageal stricture. Patient is to be scheduled for EGD with possible dilation.The risks, benefits, and alternatives to endoscopy with possible biopsy and possible dilation were discussed with the patient and they consent to proceed.  In the meantime, as she has a known history of erosion she will be given a trial of ranitidine 150 mg at at bedtime.  #2 constipation. Patient has been instructed to use Benefiber 1 tablespoon in 8 ounces of water first thing in the morning and again before noon for 4 days. She will then increase to 2 tablespoons in 8 ounces water twice each morning. She will add P fruits to her diet. She will also try Dulcolax 2 tabs daily at bedtime if she skips 3 days between bowel movements.  Further recommendations will be made pending the findings of her EGD.     Lerlene Treadwell, Vita Barley PA-C 08/25/2014,

## 2014-08-25 NOTE — Patient Instructions (Signed)
You have been scheduled for an endoscopy. Please follow written instructions given to you at your visit today. If you use inhalers (even only as needed), please bring them with you on the day of your procedure. Your physician has requested that you go to www.startemmi.com and enter the access code given to you at your visit today. This web site gives a general overview about your procedure. However, you should still follow specific instructions given to you by our office regarding your preparation for the procedure.  Use Zantac 150mg  every night at bedtime Use Benefiber 1 tbsp in [place] water first thing in the morning and again before noon for 4 days. Then Increase to 2 Tbsp in 8 oz water each morning Use "P" Fruits Peaches,Prunes,Plums,Pinapple,Pear juice Use Dulcolax 2 tabs every night at bedtime if you skip 3 days without a Bowel Movement

## 2014-09-01 ENCOUNTER — Encounter: Payer: Self-pay | Admitting: Internal Medicine

## 2014-09-01 ENCOUNTER — Ambulatory Visit (AMBULATORY_SURGERY_CENTER): Payer: Medicare Other | Admitting: Internal Medicine

## 2014-09-01 VITALS — BP 126/80 | HR 83 | Temp 98.1°F | Resp 16 | Ht 65.0 in | Wt 177.0 lb

## 2014-09-01 DIAGNOSIS — K225 Diverticulum of esophagus, acquired: Secondary | ICD-10-CM

## 2014-09-01 DIAGNOSIS — K222 Esophageal obstruction: Secondary | ICD-10-CM

## 2014-09-01 DIAGNOSIS — R1310 Dysphagia, unspecified: Secondary | ICD-10-CM

## 2014-09-01 HISTORY — DX: Diverticulum of esophagus, acquired: K22.5

## 2014-09-01 MED ORDER — SODIUM CHLORIDE 0.9 % IV SOLN: 500.0000 mL | INTRAVENOUS | Status: DC

## 2014-09-01 NOTE — Patient Instructions (Addendum)
There was a stricture - narrow area in the esophagus that needed to be dilated. You may need a higher dose of omeprazole if you have been on the 20 mg for a while.  Please make an appointment to see me in august (call soon to get this). If your swallowing is not a lot better by July call me back.  I appreciate the opportunity to care for you. Gatha Mayer, MD, FACG YOU HAD AN ENDOSCOPIC PROCEDURE TODAY AT [facility]:   Refer to the procedure report that was given to you for any specific questions about what was found during the examination.  If the procedure report does not answer your questions, please call your gastroenterologist to clarify.  If you requested that your care partner not be given the details of your procedure findings, then the procedure report has been included in a sealed envelope for you to review at your convenience later.  YOU SHOULD EXPECT: Some feelings of bloating in the abdomen. Passage of more gas than usual.  Walking can help get rid of the air that was put into your GI tract during the procedure and reduce the bloating. If you had a lower endoscopy (such as a colonoscopy or flexible sigmoidoscopy) you may notice spotting of blood in your stool or on the toilet paper. If you underwent a bowel prep for your procedure, you may not have a normal bowel movement for a few days.  Please Note:  You might notice some irritation and congestion in your nose or some drainage.  This is from the oxygen used during your procedure.  There is no need for concern and it should clear up in a day or so.  SYMPTOMS TO REPORT IMMEDIATELY:    Following upper endoscopy (EGD)  Vomiting of blood or coffee ground material  New chest pain or pain under the shoulder blades  Painful or persistently difficult swallowing  New shortness of breath  Fever of 100F or higher  Black, tarry-looking stools  For urgent or emergent issues, a gastroenterologist can be  reached at any hour by calling [phone].   DIET: Your first meal following the procedure should be a small meal and then it is ok to progress to your normal diet. Heavy or fried foods are harder to digest and may make you feel nauseous or bloated.  Likewise, meals heavy in dairy and vegetables can increase bloating.  Drink plenty of fluids but you should avoid alcoholic beverages for 24 hours.  ACTIVITY:  You should plan to take it easy for the rest of today and you should NOT DRIVE or use heavy machinery until tomorrow (because of the sedation medicines used during the test).    FOLLOW UP: Our staff will call the number listed on your records the next business day following your procedure to check on you and address any questions or concerns that you may have regarding the information given to you following your procedure. If we do not reach you, we will leave a message.  However, if you are feeling well and you are not experiencing any problems, there is no need to return our call.  We will assume that you have returned to your regular daily activities without incident.  If any biopsies were taken you will be contacted by phone or by letter within the next 1-3 weeks.  Please call us at [phone] if you have not heard about the biopsies in 3 weeks.  SIGNATURES/CONFIDENTIALITY: You and/or your care partner have signed paperwork which will be entered into your electronic medical record.  These signatures attest to the fact that that the information above on your After Visit Summary has been reviewed and is understood.  Full responsibility of the confidentiality of this discharge information lies with you and/or your care-partner.

## 2014-09-01 NOTE — Progress Notes (Signed)
Patient awakening,vss,report to rn 

## 2014-09-01 NOTE — Progress Notes (Signed)
Called to room to assist during endoscopic procedure.  Patient ID and intended procedure confirmed with present staff. Received instructions for my participation in the procedure from the performing physician.  

## 2014-09-01 NOTE — Op Note (Signed)
[facility]  [address] [place], [address]   ENDOSCOPY PROCEDURE REPORT  PATIENT: Stephanie Bell, Stephanie Bell  MR#: [id number] BIRTHDATE: Aug 07, 1940 , 74  yrs. old GENDER: female ENDOSCOPIST: Gatha Mayer, MD, [facility] PROCEDURE DATE:  09/01/2014 PROCEDURE:  EGD w/ balloon dilation ASA CLASS:     Class III INDICATIONS:  dysphagia. MEDICATIONS: Propofol 200 mg IV and Monitored anesthesia care TOPICAL ANESTHETIC: none  DESCRIPTION OF PROCEDURE: After the risks benefits and alternatives of the procedure were thoroughly explained, informed consent was obtained.  The LB [id number] endoscope was introduced through the mouth and advanced to the second portion of the duodenum , Without limitations.  The instrument was slowly withdrawn as the mucosa was fully examined.    1) Ring-like stricture at 35 cm.  Dilated 15 and 16.5 mm with balloon and also partially dilated with scope initially. 2) Hiatal hernia 35-38 cm 3) Intact fundoplication 4) Otherwise normal.  Retroflexed views revealed as previously described. The scope was then withdrawn from the patient and the procedure completed.  COMPLICATIONS: There were no immediate complications.  ENDOSCOPIC IMPRESSION: 1) Ring-like stricture at 35 cm.  Dilated 15 and 16.5 mm with balloon and also partially dilated with scope initially. 2) Hiatal hernia 35-38 cm 3) Intact fundoplication 4) Otherwise normal  RECOMMENDATIONS: 1.  Clear liquids until 3 PM , then soft foods rest of day.  Resume prior diet tomorrow. 2.  Call for office visit to be seen in August 3.  Continue PPI    may need increased dose    eSigned:  Gatha Mayer, MD, Marval Regal 09/01/2014 2:11 PM    CC: The Patient, Kaylyn Lim, MD and [place] Pink, MD

## 2014-09-01 NOTE — Progress Notes (Signed)
Agree w/ Ms. Hvozdovic's note and mangement.  

## 2014-09-02 ENCOUNTER — Telehealth: Payer: Self-pay

## 2014-09-02 NOTE — Telephone Encounter (Signed)
  Follow up Call-  Call back number 09/01/2014 10/16/2012  Post procedure Call Back phone  # [phone]  Permission to leave phone message Yes Yes     Patient questions:  Do you have a fever, pain , or abdominal swelling? No. Pain Score  0 *  Have you tolerated food without any problems? Yes.    Have you been able to return to your normal activities? Yes.    Do you have any questions about your discharge instructions: Diet   No. Medications  No. Follow up visit  No.  Do you have questions or concerns about your Care? No.  Actions: * If pain score is 4 or above: No action needed, pain <4.     Per the pt her throat was "a little sore this morning".  I advised her she could try a warm water gargle, OTC sercrets to suck on.  Pt to call if sx do not resolve in the next couple of days. maw

## 2014-09-04 ENCOUNTER — Emergency Department (HOSPITAL_COMMUNITY)
Admission: EM | Admit: 2014-09-04 | Discharge: 2014-09-04 | Disposition: A | Discharge status: Home / Self Care | Payer: Medicare Other | Attending: Emergency Medicine | Admitting: Emergency Medicine

## 2014-09-04 ENCOUNTER — Emergency Department (HOSPITAL_COMMUNITY): Payer: Medicare Other

## 2014-09-04 ENCOUNTER — Encounter (HOSPITAL_COMMUNITY): Payer: Self-pay

## 2014-09-04 DIAGNOSIS — S20219A Contusion of unspecified front wall of thorax, initial encounter: Secondary | ICD-10-CM

## 2014-09-04 DIAGNOSIS — Y999 Unspecified external cause status: Secondary | ICD-10-CM | POA: Insufficient documentation

## 2014-09-04 DIAGNOSIS — Z87440 Personal history of urinary (tract) infections: Secondary | ICD-10-CM | POA: Diagnosis not present

## 2014-09-04 DIAGNOSIS — S199XXA Unspecified injury of neck, initial encounter: Secondary | ICD-10-CM | POA: Diagnosis not present

## 2014-09-04 DIAGNOSIS — Z7982 Long term (current) use of aspirin: Secondary | ICD-10-CM | POA: Insufficient documentation

## 2014-09-04 DIAGNOSIS — Z87442 Personal history of urinary calculi: Secondary | ICD-10-CM | POA: Diagnosis not present

## 2014-09-04 DIAGNOSIS — K219 Gastro-esophageal reflux disease without esophagitis: Secondary | ICD-10-CM | POA: Insufficient documentation

## 2014-09-04 DIAGNOSIS — Y9289 Other specified places as the place of occurrence of the external cause: Secondary | ICD-10-CM | POA: Diagnosis not present

## 2014-09-04 DIAGNOSIS — Z79899 Other long term (current) drug therapy: Secondary | ICD-10-CM | POA: Diagnosis not present

## 2014-09-04 DIAGNOSIS — Y939 Activity, unspecified: Secondary | ICD-10-CM | POA: Insufficient documentation

## 2014-09-04 DIAGNOSIS — S0990XA Unspecified injury of head, initial encounter: Secondary | ICD-10-CM

## 2014-09-04 DIAGNOSIS — Z8701 Personal history of pneumonia (recurrent): Secondary | ICD-10-CM | POA: Diagnosis not present

## 2014-09-04 DIAGNOSIS — W06XXXA Fall from bed, initial encounter: Secondary | ICD-10-CM | POA: Diagnosis not present

## 2014-09-04 NOTE — ED Notes (Signed)
Pt c/o head injury and bilateral ribcage pain after falling out of bed this morning.  Pain score 10/10 only w/ palpation.  Pt reports that she had a nightmare, fell out of bed, and hit head on nightstand.  A & Ox4.  Pt at neuro baseline.  Sts she took an 81mg  and 325mg  aspirin, because she was "afraid of blood clots."

## 2014-09-04 NOTE — Discharge Instructions (Signed)
Head Injury °You have received a head injury. It does not appear serious at this time. Headaches and vomiting are common following head injury. It should be easy to awaken from sleeping. Sometimes it is necessary for you to stay in the emergency department for a while for observation. Sometimes admission to the hospital may be needed. After injuries such as yours, most problems occur within the first 24 hours, but side effects may occur up to 7-10 days after the injury. It is important for you to carefully monitor your condition and contact your health care provider or seek immediate medical care if there is a change in your condition. °WHAT ARE THE TYPES OF HEAD INJURIES? °Head injuries can be as minor as a bump. Some head injuries can be more severe. More severe head injuries include: °· A jarring injury to the brain (concussion). °· A bruise of the brain (contusion). This mean there is bleeding in the brain that can cause swelling. °· A cracked skull (skull fracture). °· Bleeding in the brain that collects, clots, and forms a bump (hematoma). °WHAT CAUSES A HEAD INJURY? °A serious head injury is most likely to happen to someone who is in a car wreck and is not wearing a seat belt. Other causes of major head injuries include bicycle or motorcycle accidents, sports injuries, and falls. °HOW ARE HEAD INJURIES DIAGNOSED? °A complete history of the event leading to the injury and your current symptoms will be helpful in diagnosing head injuries. Many times, pictures of the brain, such as CT or MRI are needed to see the extent of the injury. Often, an overnight hospital stay is necessary for observation.  °WHEN SHOULD I SEEK IMMEDIATE MEDICAL CARE?  °You should get help right away if: °· You have confusion or drowsiness. °· You feel sick to your stomach (nauseous) or have continued, forceful vomiting. °· You have dizziness or unsteadiness that is getting worse. °· You have severe, continued headaches not relieved by  medicine. Only take over-the-counter or prescription medicines for pain, fever, or discomfort as directed by your health care provider. °· You do not have normal function of the arms or legs or are unable to walk. °· You notice changes in the black spots in the center of the colored part of your eye (pupil). °· You have a clear or bloody fluid coming from your nose or ears. °· You have a loss of vision. °During the next 24 hours after the injury, you must stay with someone who can watch you for the warning signs. This person should contact local emergency services (911 in the [place] if you have seizures, you become unconscious, or you are unable to wake up. °HOW CAN I PREVENT A HEAD INJURY IN THE FUTURE? °The most important factor for preventing major head injuries is avoiding motor vehicle accidents.  To minimize the potential for damage to your head, it is crucial to wear seat belts while riding in motor vehicles. Wearing helmets while bike riding and playing collision sports (like football) is also helpful. Also, avoiding dangerous activities around the house will further help reduce your risk of head injury.  °WHEN CAN I RETURN TO NORMAL ACTIVITIES AND ATHLETICS? °You should be reevaluated by your health care provider before returning to these activities. If you have any of the following symptoms, you should not return to activities or contact sports until 1 week after the symptoms have stopped: °· Persistent headache. °· Dizziness or vertigo. °· Poor attention and concentration. °· Confusion. °·   Memory problems.  Nausea or vomiting.  Fatigue or tire easily.  Irritability.  Intolerant of bright lights or loud noises.  Anxiety or depression.  Disturbed sleep. MAKE SURE YOU:   Understand these instructions.  Will watch your condition.  Will get help right away if you are not doing well or get worse. Document Released: 03/06/2005 Document Revised: 03/11/2013 Document Reviewed:  11/11/2012 [facility] Patient Information 2015 [place], [place]. This information is not intended to replace advice given to you by your health care provider. Make sure you discuss any questions you have with your health care provider.   Chest Contusion A chest contusion is a deep bruise on your chest area. Contusions are the result of an injury that caused bleeding under the skin. A chest contusion may involve bruising of the skin, muscles, or ribs. The contusion may turn blue, purple, or yellow. Minor injuries will give you a painless contusion, but more severe contusions may stay painful and swollen for a few weeks. CAUSES  A contusion is usually caused by a blow, trauma, or direct force to an area of the body. SYMPTOMS   Swelling and redness of the injured area.  Discoloration of the injured area.  Tenderness and soreness of the injured area.  Pain. DIAGNOSIS  The diagnosis can be made by taking a history and performing a physical exam. An X-ray, CT scan, or MRI may be needed to determine if there were any associated injuries, such as broken bones (fractures) or internal injuries. TREATMENT  Often, the best treatment for a chest contusion is resting, icing, and applying cold compresses to the injured area. Deep breathing exercises may be recommended to reduce the risk of pneumonia. Over-the-counter medicines may also be recommended for pain control. HOME CARE INSTRUCTIONS   Put ice on the injured area.  Put ice in a plastic bag.  Place a towel between your skin and the bag.  Leave the ice on for 15-20 minutes, 03-04 times a day.  Only take over-the-counter or prescription medicines as directed by your caregiver. Your caregiver may recommend avoiding anti-inflammatory medicines (aspirin, ibuprofen, and naproxen) for 48 hours because these medicines may increase bruising.  Rest the injured area.  Perform deep-breathing exercises as directed by your caregiver.  Stop smoking if you  smoke.  Do not lift objects over 5 pounds (2.3 kg) for 3 days or longer if recommended by your caregiver. SEEK IMMEDIATE MEDICAL CARE IF:   You have increased bruising or swelling.  You have pain that is getting worse.  You have difficulty breathing.  You have dizziness, weakness, or fainting.  You have blood in your urine or stool.  You cough up or vomit blood.  Your swelling or pain is not relieved with medicines. MAKE SURE YOU:   Understand these instructions.  Will watch your condition.  Will get help right away if you are not doing well or get worse. Document Released: 11/29/2000 Document Revised: 11/29/2011 Document Reviewed: 08/28/2011 [facility] Patient Information 2015 [place], [place]. This information is not intended to replace advice given to you by your health care provider. Make sure you discuss any questions you have with your health care provider.

## 2014-09-04 NOTE — ED Provider Notes (Signed)
CSN: [id number]     Arrival date & time 09/04/14  1231 History   First MD Initiated Contact with Patient 09/04/14 1531     Chief Complaint  Patient presents with  . Fall  . Head Injury  . Ribcage pain      (Consider location/radiation/quality/duration/timing/severity/associated sxs/prior Treatment) Patient is a 74 y.o. female presenting with fall and head injury. The history is provided by the patient. No language interpreter was used.  Fall This is a new problem. The current episode started 6 to 12 hours ago. The problem occurs rarely. The problem has not changed since onset.Associated symptoms include chest pain and headaches. Pertinent negatives include no abdominal pain and no shortness of breath. Nothing aggravates the symptoms. Nothing relieves the symptoms. She has tried ASA for the symptoms. The treatment provided no relief.  Head Injury Location:  Occipital Time since incident:  13 hours Mechanism of injury: fall   Pain details:    Quality:  Dull and aching   Severity:  Moderate   Duration:  13 hours   Timing:  Constant   Progression:  Unchanged Chronicity:  New Relieved by:  Nothing Worsened by:  Nothing tried Ineffective treatments: ASA. Associated symptoms: headache and neck pain   Associated symptoms: no blurred vision, no difficulty breathing, no disorientation, no double vision, no focal weakness, no memory loss, no nausea, no numbness, no seizures, no tinnitus and no vomiting   Associated symptoms comment:  Posterior chest wall pain Risk factors: aspirin and being elderly     Past Medical History  Diagnosis Date  . Interstitial cystitis   . Hx: UTI (urinary tract infection)   . Weight loss   . Wound disruption, post-op, skin   . Pneumonia 05-09-11    2011 last time  . Paraesophageal hernia   . GERD (gastroesophageal reflux disease)   . H/O hiatal hernia   . Kidney stone   . 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 Vaginal hysterectomy    . Septoplasty    . Back surgery  05-09-11    x 2 lumbar  . Cholecystectomy  05-09-11    laparoscopic  . Tonsillectomy  05-09-11    Tonsillectomy  . Kidney stone surgery  05-09-11    past hx. and cystoscopy for bladder issues  . Laparoscopic nissen fundoplication  3/82/5053    Procedure: LAPAROSCOPIC NISSEN FUNDOPLICATION;  Surgeon: Pedro Earls, MD;  Location: [facility];  Service: General;  Laterality: N/A;  will also remove sebaceous cyst of chest wall  . Irrigation and debridement sebaceous cyst  05/16/2011    Procedure: IRRIGATION AND DEBRIDEMENT SEBACEOUS CYST;  Surgeon: Pedro Earls, MD;  Location: [facility];  Service: General;  Laterality: N/A;  middle of chest on sternum  . Umbilical hernia repair  05/16/2011    Procedure: HERNIA REPAIR UMBILICAL ADULT;  Surgeon: Pedro Earls, MD;  Location: [facility];  Service: General;  Laterality: N/A;  with mesh   . Hernia repair  05/16/2011    hiatal hernia  . Colonoscopy  2002, 2008     rectal hyperplastic polyp 2002, diverticulosis 2008  . Laparoscopic nissen fundoplication  97/67/3419    Procedure: LAPAROSCOPIC NISSEN FUNDOPLICATION;  Surgeon: Pedro Earls, MD;  Location: [facility];  Service: General;  Laterality: N/A;  Re-do Laparoscopic Nissen   . Hiatal hernia repair  02/09/2012    Procedure: LAPAROSCOPIC REPAIR OF HIATAL HERNIA;  Surgeon: Pedro Earls,  MD;  Location: [facility];  Service: General;  Laterality: N/A;  . Insertion of mesh  02/09/2012    Procedure: INSERTION OF MESH;  Surgeon: Pedro Earls, MD;  Location: [facility];  Service: General;;   Family History  Problem Relation Age of Onset  . Colon cancer Neg Hx   . Ulcers Paternal Grandmother   . Ulcers Father   . Alcohol abuse Father    History  Substance Use Topics  . Smoking status: Never Smoker   . Smokeless tobacco: Never Used  . Alcohol Use: 1.8 oz/week    3 Glasses of wine per week   OB History    No data  available     Review of Systems  Constitutional: Negative for fever, chills, diaphoresis, activity change, appetite change and fatigue.  HENT: Negative for congestion, facial swelling, rhinorrhea, sore throat and tinnitus.   Eyes: Negative for blurred vision, double vision, photophobia and discharge.  Respiratory: Negative for cough, chest tightness and shortness of breath.   Cardiovascular: Positive for chest pain. Negative for palpitations and leg swelling.  Gastrointestinal: Negative for nausea, vomiting, abdominal pain and diarrhea.  Endocrine: Negative for polydipsia and polyuria.  Genitourinary: Negative for dysuria, frequency, difficulty urinating and pelvic pain.  Musculoskeletal: Positive for neck pain. Negative for back pain, arthralgias and neck stiffness.  Skin: Negative for color change and wound.  Allergic/Immunologic: Negative for immunocompromised state.  Neurological: Positive for headaches. Negative for focal weakness, seizures, facial asymmetry, weakness and numbness.  Hematological: Does not bruise/bleed easily.  Psychiatric/Behavioral: Negative for memory loss, confusion and agitation.      Allergies  Sulfa antibiotics  Home Medications   Prior to Admission medications   Medication Sig Start Date End Date Taking? Authorizing Provider  aspirin 325 MG tablet Take 325 mg by mouth once.   Yes Historical Provider, MD  aspirin 81 MG tablet Take 81 mg by mouth daily.   Yes Historical Provider, MD  Cholecalciferol (D-5000) 5000 UNITS TABS Take 1 tablet by mouth daily.    Yes Historical Provider, MD  Cranberry-Cholecalciferol 4200-500 MG-UNIT CAPS Take 1 capsule by mouth 2 (two) times daily.    Yes Historical Provider, MD  methylphenidate (RITALIN) 10 MG tablet Take 10 mg by mouth 2 (two) times daily as needed (traveling.).    Yes Historical Provider, MD  Multiple Vitamin (MULTIVITAMIN) tablet Take 1 tablet by mouth daily.   Yes Historical Provider, MD  omeprazole  (PRILOSEC) 20 MG capsule Take 20 mg by mouth daily.    Yes Historical Provider, MD  Saline 0.65 % (SOLN) SOLN Place 1 spray into the nose daily as needed (congestion). For nasal congestion   Yes Historical Provider, MD  simethicone (MYLICON) 80 MG chewable tablet Chew 80 mg by mouth daily as needed (indigestion).    Yes Historical Provider, MD  ondansetron (ZOFRAN) 4 MG tablet Take 4 mg by mouth every 8 (eight) hours as needed for nausea or vomiting.     Historical Provider, MD  promethazine (PHENERGAN) 25 MG suppository Place 25 mg rectally every 6 (six) hours as needed for nausea or vomiting.    Historical Provider, MD   BP 128/82 mmHg  Pulse 72  Temp(Src) 97.8 F (36.6 C) (Oral)  Resp 16  SpO2 95% Physical Exam  Constitutional: She is oriented to person, place, and time. She appears well-developed and well-nourished. No distress.  HENT:  Head: Normocephalic and atraumatic.    Mouth/Throat: No oropharyngeal exudate.  Eyes: Pupils are equal, round, and  reactive to light.  Neck: Normal range of motion. Neck supple.  Cardiovascular: Normal rate, regular rhythm and normal heart sounds.  Exam reveals no gallop and no friction rub.   No murmur heard. Pulmonary/Chest: Effort normal and breath sounds normal. No respiratory distress. She has no wheezes. She has no rales.    Abdominal: Soft. Bowel sounds are normal. She exhibits no distension and no mass. There is no tenderness. There is no rebound and no guarding.  Musculoskeletal: Normal range of motion. She exhibits no edema or tenderness.  Neurological: She is alert and oriented to person, place, and time.  Skin: Skin is warm and dry.  Psychiatric: She has a normal mood and affect.    ED Course  Procedures (including critical care time) Labs Review Labs Reviewed - No data to display  Imaging Review Dg Chest 2 View  09/04/2014   CLINICAL DATA:  Bilateral lower ribcage pain after falling today. Golden [place] out of the head to the floor.   EXAM: CHEST  2 VIEW  COMPARISON:  09/29/2013  FINDINGS: Heart size is normal. Mediastinal shadows are unremarkable except for calcification of the thoracic aorta. Lungs are clear. No pneumothorax or hemothorax. No visible rib fracture on the routine chest radiography.  IMPRESSION: No active cardiopulmonary disease. No bone abnormality seen on routine chest films.   Electronically Signed   By: Nelson Chimes M.D.   On: 09/04/2014 16:30   Ct Head Wo Contrast  09/04/2014   CLINICAL DATA:  Golden Circle out of bit head. Trauma to the head and neck. Pain.  EXAM: CT HEAD WITHOUT CONTRAST  CT CERVICAL SPINE WITHOUT CONTRAST  TECHNIQUE: Multidetector CT imaging of the head and cervical spine was performed following the standard protocol without intravenous contrast. Multiplanar CT image reconstructions of the cervical spine were also generated.  COMPARISON:  10/09/2011  FINDINGS: CT HEAD FINDINGS  The brain has normal appearance for age. No accelerated atrophy. No evidence of old or acute infarction, mass lesion, hemorrhage, hydrocephalus or extra-axial collection. No skull fracture. No fluid in the sinuses, middle ears or mastoids.  CT CERVICAL SPINE FINDINGS  No traumatic malalignment. No fracture. No soft tissue swelling. There is ordinary degenerative arthritis at the C1-2 articulation. There is ordinary degenerative facet arthritis on the left at C3-4 and C4-5. There is degenerative spondylosis at C5-6 and C6-7 with small endplate osteophytes.  IMPRESSION: Head CT:  Normal for age.  No traumatic finding.  Cervical spine CT: No traumatic finding. Ordinary degenerative changes as outlined above.   Electronically Signed   By: Nelson Chimes M.D.   On: 09/04/2014 16:33   Ct Cervical Spine Wo Contrast  09/04/2014   CLINICAL DATA:  Gold[place] Circle out of bit head. Trauma to the head and neck. Pain.  EXAM: CT HEAD WITHOUT CONTRAST  CT CERVICAL SPINE WITHOUT CONTRAST  TECHNIQUE: Multidetector CT imaging of the head and cervical spine was  performed following the standard protocol without intravenous contrast. Multiplanar CT image reconstructions of the cervical spine were also generated.  COMPARISON:  10/09/2011  FINDINGS: CT HEAD FINDINGS  The brain has normal appearance for age. No accelerated atrophy. No evidence of old or acute infarction, mass lesion, hemorrhage, hydrocephalus or extra-axial collection. No skull fracture. No fluid in the sinuses, middle ears or mastoids.  CT CERVICAL SPINE FINDINGS  No traumatic malalignment. No fracture. No soft tissue swelling. There is ordinary degenerative arthritis at the C1-2 articulation. There is ordinary degenerative facet arthritis on the left at C3-4 and C4-5.  There is degenerative spondylosis at C5-6 and C6-7 with small endplate osteophytes.  IMPRESSION: Head CT:  Normal for age.  No traumatic finding.  Cervical spine CT: No traumatic finding. Ordinary degenerative changes as outlined above.   Electronically Signed   By: Nelson Chimes M.D.   On: 09/04/2014 16:33     EKG Interpretation None      MDM   Final diagnoses:  Closed head injury without loss of consciousness, initial encounter  Chest wall contusion, unspecified laterality, initial encounter    Pt is a 74 y.o. female with Pmhx as above who presents with headache, neck pain and posterior chest wall pain after falling out of her bed in the middle of the night.  Patient states that she was having a dream callus were attacking her and woke up on the ground with her husband standing over her.  She thinks she hit her head and neck on the nightstand.  On physical exam, vital signs are stable.  Patient is in no acute distress.  She has mild abrasion to her occiput.  No reproducible posterior chest wall pain.  Lungs are clear.  No focal neuro findings.  She has taken an 81 mg aspirin followed by 325 mg aspirin several hours later because she called her PCPs office and they told her that they were worried about developing blood clots.    CT head C-spine and x-ray of chest are negative.  Epley patient is safe to be discharged home.  Nino Parsley Ogburn evaluation in the Emergency Department is complete. It has been determined that no acute conditions requiring further emergency intervention are present at this time. The patient/guardian have been advised of the diagnosis and plan. We have discussed signs and symptoms that warrant return to the ED, such as changes or worsening in symptoms, worsening pain, fever, headache, numbness, weakness, vomiting, confusion      Ernestina Patches, MD 09/04/14 2354

## 2014-09-04 NOTE — ED Notes (Signed)
Patient transported to X-ray 

## 2014-09-14 ENCOUNTER — Other Ambulatory Visit: Payer: Self-pay

## 2014-11-04 ENCOUNTER — Encounter: Payer: Self-pay | Admitting: Internal Medicine

## 2014-11-04 ENCOUNTER — Ambulatory Visit (INDEPENDENT_AMBULATORY_CARE_PROVIDER_SITE_OTHER): Payer: Medicare Other | Admitting: Internal Medicine

## 2014-11-04 ENCOUNTER — Other Ambulatory Visit: Payer: Self-pay

## 2014-11-04 VITALS — BP 112/70 | HR 80 | Ht 65.0 in | Wt 184.0 lb

## 2014-11-04 DIAGNOSIS — K222 Esophageal obstruction: Secondary | ICD-10-CM | POA: Diagnosis not present

## 2014-11-04 DIAGNOSIS — R110 Nausea: Secondary | ICD-10-CM

## 2014-11-04 DIAGNOSIS — K219 Gastro-esophageal reflux disease without esophagitis: Secondary | ICD-10-CM

## 2014-11-04 DIAGNOSIS — R142 Eructation: Secondary | ICD-10-CM | POA: Insufficient documentation

## 2014-11-04 HISTORY — DX: Esophageal obstruction: K22.2

## 2014-11-04 MED ORDER — ONDANSETRON 4 MG PO TBDP: 4.0000 mg | ORAL_TABLET | Freq: Three times a day (TID) | ORAL | Status: DC | PRN

## 2014-11-04 MED ORDER — ONDANSETRON HCL 4 MG PO TABS: 4.0000 mg | ORAL_TABLET | Freq: Three times a day (TID) | ORAL | Status: DC | PRN

## 2014-11-04 MED ORDER — OMEPRAZOLE-SODIUM BICARBONATE 20-1100 MG PO CAPS: 1.0000 | ORAL_CAPSULE | Freq: Every day | ORAL | Status: DC

## 2014-11-04 NOTE — Patient Instructions (Signed)
Please purchase zegerid over the counter and take one nightly.  We have sent the following medications to your pharmacy for you to pick up at your convenience: Generic zofran  Use Mylicon as needed to break up the gas bubbles.   Call us back if this doesn't help.    I appreciate the opportunity to care for you. Silvano Rusk, MD, [facility]

## 2014-11-04 NOTE — Progress Notes (Signed)
   Subjective:    Patient ID: Stephanie Bell, female    DOB: Sep 16, 1940, 74 y.o.   MRN: [id number] Cc: gas, belching, nausea, f/u esophageal stricture HPI The patient returns after she had dilation of an esophageal stricture. She is swallowing fine. However she continues to have belching in the morning and nausea and food not tasting well. She was visiting [facility] and had a lot of nausea was concerned she was going to vomit and went to the urgent care and was prescribed ondansetron after a promethazine shot and felt much better.  He really has had these symptoms even before the esophageal dilation but after her fundoplication. She belches frequently and it takes a while for her to feel right and to be able to eat in the morning and this distresses her. She does not recall taking cimetidine no notes on her med list. She does faithfully take her Prilosec OTC. She says the ondansetron has helped with the nausea quite a bit. She would like a refill. Medications, allergies, past medical history, past surgical history, family history and social history are reviewed and updated in the EMR.  Review of Systems As above    Objective:   Physical Exam BP 112/70 mmHg  Pulse 80  Ht 5\' 5"  (1.651 m)  Wt 184 lb (83.462 kg)  BMI 30.62 kg/m2 NAD     Assessment & Plan:   Esophageal stricture  Gastroesophageal reflux disease, esophagitis presence not specified  Nausea without vomiting  Belching   Esophageal stricture is successfully treated and her dysphagia is gone.  I suspect she has a combination of reflux and gas bloat after her fundoplication. I reassured her as best I could.  Therapy planned is: Zegerid OTC qhs will be added to her regimen. Ondansetron and Mylicon prn Call me back if not helping  I appreciate the opportunity to care for this patient. CC: [place] Pink, MD Johnathan Hausen M.D.

## 2014-11-04 NOTE — Telephone Encounter (Signed)
Per Dr Carlean Purl requested that we change her zofran to the OTD type, phoned it in to the pharmacy.

## 2014-12-21 ENCOUNTER — Encounter: Payer: Self-pay | Admitting: Emergency Medicine

## 2014-12-21 ENCOUNTER — Emergency Department
Admission: EM | Admit: 2014-12-21 | Discharge: 2014-12-21 | Disposition: A | Discharge status: Home / Self Care | Payer: Medicare Other | Attending: Internal Medicine | Admitting: Internal Medicine

## 2014-12-21 DIAGNOSIS — R002 Palpitations: Secondary | ICD-10-CM | POA: Diagnosis not present

## 2014-12-21 DIAGNOSIS — K222 Esophageal obstruction: Secondary | ICD-10-CM | POA: Diagnosis not present

## 2014-12-21 DIAGNOSIS — R000 Tachycardia, unspecified: Secondary | ICD-10-CM | POA: Diagnosis present

## 2014-12-21 DIAGNOSIS — T50905A Adverse effect of unspecified drugs, medicaments and biological substances, initial encounter: Secondary | ICD-10-CM | POA: Insufficient documentation

## 2014-12-21 DIAGNOSIS — K219 Gastro-esophageal reflux disease without esophagitis: Secondary | ICD-10-CM | POA: Diagnosis not present

## 2014-12-21 DIAGNOSIS — K449 Diaphragmatic hernia without obstruction or gangrene: Secondary | ICD-10-CM | POA: Insufficient documentation

## 2014-12-21 DIAGNOSIS — I214 Non-ST elevation (NSTEMI) myocardial infarction: Secondary | ICD-10-CM

## 2014-12-21 LAB — COMPREHENSIVE METABOLIC PANEL
ALT: 12 U/L — ABNORMAL LOW (ref 14–54)
ANION GAP: 8 (ref 5–15)
AST: 19 U/L (ref 15–41)
Albumin: 4.1 g/dL (ref 3.5–5.0)
Alkaline Phosphatase: 73 U/L (ref 38–126)
BUN: 7 mg/dL (ref 6–20)
CHLORIDE: 104 mmol/L (ref 101–111)
CO2: 26 mmol/L (ref 22–32)
Calcium: 9.1 mg/dL (ref 8.9–10.3)
Creatinine, Ser: 0.76 mg/dL (ref 0.44–1.00)
Glucose, Bld: 98 mg/dL (ref 65–99)
Potassium: 3.5 mmol/L (ref 3.5–5.1)
SODIUM: 138 mmol/L (ref 135–145)
Total Bilirubin: 1.1 mg/dL (ref 0.3–1.2)
Total Protein: 6.7 g/dL (ref 6.5–8.1)

## 2014-12-21 LAB — APTT: APTT: 29 s (ref 24–36)

## 2014-12-21 LAB — CBC
HCT: 40.8 % (ref 35.0–47.0)
Hemoglobin: 14 g/dL (ref 12.0–16.0)
MCH: 28.6 pg (ref 26.0–34.0)
MCHC: 34.5 g/dL (ref 32.0–36.0)
MCV: 83 fL (ref 80.0–100.0)
PLATELETS: 160 10*3/uL (ref 150–440)
RBC: 4.91 MIL/uL (ref 3.80–5.20)
RDW: 13.5 % (ref 11.5–14.5)
WBC: 5.5 10*3/uL (ref 3.6–11.0)

## 2014-12-21 LAB — TROPONIN I
TROPONIN I: 0.08 ng/mL — AB (ref <0.031)
TROPONIN I: 0.23 ng/mL — AB (ref <0.031)

## 2014-12-21 LAB — PROTIME-INR
INR: 0.96
PROTHROMBIN TIME: 13 s (ref 11.4–15.0)

## 2014-12-21 MED ORDER — HEPARIN BOLUS VIA INFUSION
4000.0000 [IU] | Freq: Once | INTRAVENOUS | Status: DC
  Filled 2014-12-21: qty 4000

## 2014-12-21 MED ORDER — HEPARIN (PORCINE) IN NACL 100-0.45 UNIT/ML-% IJ SOLN
12.0000 [IU]/kg/h | INTRAMUSCULAR | Status: DC
  Filled 2014-12-21: qty 250

## 2014-12-21 NOTE — ED Provider Notes (Signed)
[facility] Emergency Department Provider Note  ____________________________________________  Time seen: On arrival, via EMS  I have reviewed the triage vital signs and the nursing notes.   HISTORY  Chief Complaint Tachycardia    HPI Stephanie Bell is a 74 y.o. female who presents from her dentist office after an episode of palpitations. Patient reports she had a numbing medication injected into her left lower gums. Approximate 5-10 minutes later she developed a racing heart felt numb in both of her lower extremities and had the urge to urinate. She denies chest pain. She denies shortness of breath. She felt better on arrival to the emergency department. She has no history of coronary artery disease. No history of arrhythmias no history of allergic reactions.     Past Medical History  Diagnosis Date  . Interstitial cystitis   . Hx: UTI (urinary tract infection)   . Weight loss   . Wound disruption, post-op, skin   . Pneumonia 05-09-11    2011 last time  . Paraesophageal hernia   . GERD (gastroesophageal reflux disease)   . H/O hiatal hernia   . Kidney stone   . 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 Esophageal stricture 11/04/2014    Patient Active Problem List   Diagnosis Date Noted  . Esophageal stricture 11/04/2014  . GERD (gastroesophageal reflux disease) 11/04/2014  . Nausea without vomiting 11/04/2014  . Belching 11/04/2014  . 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 Sebaceous cyst of breast 12/20/2011  . Breast cancer ([place]) 12/13/2011  . S/P Nissen fundoplication (without gastrostomy tube) procedure 06/08/2011    Past Surgical History  Procedure Laterality Date  . Vaginal hysterectomy    . Septoplasty    . Back surgery  05-09-11    x 2 lumbar  . Cholecystectomy  05-09-11    laparoscopic  . Tonsillectomy  05-09-11    Tonsillectomy  . Kidney stone surgery  05-09-11    past hx. and cystoscopy for bladder issues   . Laparoscopic nissen fundoplication  1/61/0960    Procedure: LAPAROSCOPIC NISSEN FUNDOPLICATION;  Surgeon: Pedro Earls, MD;  Location: [facility];  Service: General;  Laterality: N/A;  will also remove sebaceous cyst of chest wall  . Irrigation and debridement sebaceous cyst  05/16/2011    Procedure: IRRIGATION AND DEBRIDEMENT SEBACEOUS CYST;  Surgeon: Pedro Earls, MD;  Location: [facility];  Service: General;  Laterality: N/A;  middle of chest on sternum  . Umbilical hernia repair  05/16/2011    Procedure: HERNIA REPAIR UMBILICAL ADULT;  Surgeon: Pedro Earls, MD;  Location: [facility];  Service: General;  Laterality: N/A;  with mesh   . Hernia repair  05/16/2011    hiatal hernia  . Colonoscopy  2002, 2008     rectal hyperplastic polyp 2002, diverticulosis 2008  . Laparoscopic nissen fundoplication  45/40/9811    Procedure: LAPAROSCOPIC NISSEN FUNDOPLICATION;  Surgeon: Pedro Earls, MD;  Location: [facility];  Service: General;  Laterality: N/A;  Re-do Laparoscopic Nissen   . Hiatal hernia repair  02/09/2012    Procedure: LAPAROSCOPIC REPAIR OF HIATAL HERNIA;  Surgeon: Pedro Earls, MD;  Location: [facility];  Service: General;  Laterality: N/A;  . Insertion of mesh  02/09/2012    Procedure: INSERTION OF MESH;  Surgeon: Pedro Earls, MD;  Location: [facility];  Service: General;;  . Esophagogastroduodenoscopy      Current Outpatient Rx  Name  Route  Sig  Dispense  Refill  . aspirin  325 MG tablet   Oral   Take 325 mg by mouth once.         Marland Kitchen aspirin 81 MG tablet   Oral   Take 81 mg by mouth daily.         . Cholecalciferol (D-5000) 5000 UNITS TABS   Oral   Take 1 tablet by mouth daily.          . Cranberry-Cholecalciferol 4200-500 MG-UNIT CAPS   Oral   Take 1 capsule by mouth 2 (two) times daily.          . methylphenidate (RITALIN) 10 MG tablet   Oral   Take 10 mg by mouth 2 (two) times daily as needed (traveling.).          Marland Kitchen Multiple Vitamin (MULTIVITAMIN)  tablet   Oral   Take 1 tablet by mouth daily.         Marland Kitchen omeprazole (PRILOSEC) 20 MG capsule   Oral   Take 20 mg by mouth daily.          Earney Navy Bicarbonate (ZEGERID OTC) 20-1100 MG CAPS capsule   Oral   Take 1 capsule by mouth daily before breakfast.   28 each   0   . ondansetron (ZOFRAN-ODT) 4 MG disintegrating tablet   Oral   Take 1 tablet (4 mg total) by mouth every 8 (eight) hours as needed for nausea or vomiting.   30 tablet   11   . promethazine (PHENERGAN) 25 MG suppository   Rectal   Place 25 mg rectally every 6 (six) hours as needed for nausea or vomiting.         . Saline 0.65 % (SOLN) SOLN   Nasal   Place 1 spray into the nose daily as needed (congestion). For nasal congestion         . simethicone (MYLICON) 80 MG chewable tablet   Oral   Chew 80 mg by mouth daily as needed (indigestion).            Allergies Sulfa antibiotics  Family History  Problem Relation Age of Onset  . Colon cancer Neg Hx   . Ulcers Paternal Grandmother   . Ulcers Father   . Alcohol abuse Father     Social History Social History  Substance Use Topics  . Smoking status: Never Smoker   . Smokeless tobacco: Never Used  . Alcohol Use: 4.2 oz/week    7 Glasses of wine per week    Review of Systems  Constitutional: Negative for fever. Eyes: Negative for visual changes. ENT: Negative for sore throat Cardiovascular: Negative for chest pain. Racing heart Respiratory: Negative for shortness of breath. Gastrointestinal: Negative for abdominal pain, vomiting and diarrhea. Genitourinary: Negative for dysuria. Musculoskeletal: Negative for back pain. Skin: Negative for rash. Neurological: Negative for headaches or focal weakness. Positive for tingling in the lower extremities Psychiatric: Mild anxiety    ____________________________________________   PHYSICAL EXAM:  VITAL SIGNS: ED Triage Vitals  Enc Vitals Group     BP 12/21/14 1157 174/116 mmHg      Pulse Rate 12/21/14 1157 74     Resp 12/21/14 1157 15     Temp 12/21/14 1157 98 F (36.7 C)     Temp Source 12/21/14 1157 Oral     SpO2 12/21/14 1157 99 %     Weight 12/21/14 1157 175 lb (79.379 kg)     Height 12/21/14 1157 5\' 5"  (1.651 m)     Head Cir --  Peak Flow --      Pain Score 12/21/14 1157 3     Pain Loc --      Pain Edu? --      Excl. in [place]? --      Constitutional: Alert and oriented. Well appearing and in no distress. Eyes: Conjunctivae are normal.  ENT   Head: Normocephalic and atraumatic.   Mouth/Throat: Mucous membranes are moist. Cardiovascular: Normal rate, regular rhythm. Normal and symmetric distal pulses are present in all extremities. No murmurs, rubs, or gallops. Respiratory: Normal respiratory effort without tachypnea nor retractions. Breath sounds are clear and equal bilaterally.  Gastrointestinal: Soft and non-tender in all quadrants. No distention. There is no CVA tenderness. Genitourinary: deferred Musculoskeletal: Nontender with normal range of motion in all extremities. No lower extremity tenderness nor edema. Neurologic:  Normal speech and language. No gross focal neurologic deficits are appreciated. Skin:  Skin is warm, dry and intact. No rash noted. Psychiatric: Mood and affect are normal. Patient exhibits appropriate insight and judgment.  ____________________________________________    LABS (pertinent positives/negatives)  Labs Reviewed  COMPREHENSIVE METABOLIC PANEL - Abnormal; Notable for the following:    ALT 12 (*)    All other components within normal limits  TROPONIN I - Abnormal; Notable for the following:    Troponin I 0.08 (*)    All other components within normal limits  CBC  TROPONIN I    ____________________________________________   EKG  ED ECG REPORT I, Lavonia Drafts, the attending physician, personally viewed and interpreted this ECG.  Date: 12/21/2014 EKG Time: 11:52 AM Rate: 68 Rhythm: normal  sinus rhythm QRS Axis: normal Intervals: normal ST/T Wave abnormalities: normal Conduction Disutrbances: none Narrative Interpretation: unremarkable   ____________________________________________    RADIOLOGY I have personally reviewed any xrays that were ordered on this patient: None  ____________________________________________   PROCEDURES  Procedure(s) performed: none  Critical Care performed: yes  CRITICAL CARE Performed by: Lavonia Drafts   Total critical care time:30 min  Critical care time was exclusive of separately billable procedures and treating other patients.  Critical care was necessary to treat or prevent imminent or life-threatening deterioration.  Critical care was time spent personally by me on the following activities: development of treatment plan with patient and/or surrogate as well as nursing, discussions with consultants, evaluation of patient's response to treatment, examination of patient, obtaining history from patient or surrogate, ordering and performing treatments and interventions, ordering and review of laboratory studies, ordering and review of radiographic studies, pulse oximetry and re-evaluation of patient's condition.   ____________________________________________   INITIAL IMPRESSION / ASSESSMENT AND PLAN / ED COURSE  Pertinent labs & imaging results that were available during my care of the patient were reviewed by me and considered in my medical decision making (see chart for details).  Patient with episode of racing heart after lidocaine injection. Is not clear whether these are related. Differential includes reaction from injection, anxiety, arrhythmia. Patient feels and appears well now  Patient with elevated troponin. I discussed this with her and she is adamant about not wanting to come into the hospital despite my recommendation. I was able to convince her to stay in the emergency department for a second troponin to  evaluate whether it is increasing or decreasing as it may be related to strain.  ----------------------------------------- 3:20 PM on 12/21/2014 -----------------------------------------  Second troponin is significant elevated. At this point I have been able to convince the patient to stay in the hospital as I suspect she  has had an NSTEMI. I will heparinize the patient   ____________________________________________   FINAL CLINICAL IMPRESSION(S) / ED DIAGNOSES  Final diagnoses:  NSTEMI (non-ST elevated myocardial infarction) (HCC)     Lavonia Drafts, MD 12/21/14 1525

## 2014-12-21 NOTE — ED Provider Notes (Addendum)
Consulting hospitalist, Dr. Earleen Newport, advised me that he was discharging this patient home. I did not see this patient, previous provider Dr. Corky Downs had recommended the patient for hospital admission/observation due to borderline elevated troponin with an episode of palpitations.  Please see Dr. Pasty Arch note and discharge instructions.  I did change the disposition in the computer to discharge.  [name] Roca, MD 12/21/14 1707  [name] Roca, MD 12/21/14 [phone]

## 2014-12-21 NOTE — Progress Notes (Signed)
ANTICOAGULATION CONSULT NOTE - Initial Consult  Pharmacy Consult for heparin gtt dosing and monitoring Indication: chest pain/ACS  Allergies  Allergen Reactions  . Sulfa Antibiotics Swelling and Rash    Patient Measurements: Height: 5\' 5"  (165.1 cm) Weight: 175 lb (79.379 kg) IBW/kg (Calculated) : 57 Heparin Dosing Weight: 73.7 kg  Vital Signs: Temp: 98 F (36.7 C) (10/03 1157) Temp Source: Oral (10/03 1157) BP: 135/94 mmHg (10/03 1500) Pulse Rate: 66 (10/03 1500)  Labs:  Recent Labs  12/21/14 1207 12/21/14 1437  HGB 14.0  --   HCT 40.8  --   PLT 160  --   CREATININE 0.76  --   TROPONINI 0.08* 0.23*    Estimated Creatinine Clearance: 63.3 mL/min (by C-G formula based on Cr of 0.76).   Medical History: Past Medical History  Diagnosis Date  . Interstitial cystitis   . Hx: UTI (urinary tract infection)   . Weight loss   . Wound disruption, post-op, skin   . Pneumonia 05-09-11    2011 last time  . Paraesophageal hernia   . GERD (gastroesophageal reflux disease)   . H/O hiatal hernia   . Kidney stone   . 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 Esophageal stricture 11/04/2014    Medications:  Infusions:  . heparin      Assessment: Pharmacy consulted to dose and monitor heparin gtt on this 74 year old female who presented to the ED complaining of palpitations and numbness of lower extremities. Dosing per ACS/chest pain protocol. Patient was not taking any oral anticoagulants prior to admission.  Troponin 0.23 ng/mL Heparin dosing weight = 73.7 kg  Baseline labs obtained in ED on 10/3: Plt: 160 INR and APTT: pending  Goal of Therapy:  Heparin level 0.3-0.7 units/ml Monitor platelets by anticoagulation protocol: Yes   Plan:  Give 4000 units bolus x 1   (~54 units/kg) Start heparin infusion at 900 units/hr    (rate of 12 units/kg/hr) Check heparin level in 8 hours and daily while on heparin Continue to monitor H&H and platelets  Thank you  for the consult.  Darylene Price Tydus Sanmiguel 12/21/2014,4:08 PM

## 2014-12-21 NOTE — Discharge Instructions (Signed)
Elevated Troponin borderline - secondary to demand ischemia from palpitations with allergic reaction

## 2014-12-21 NOTE — H&P (Signed)
[facility] [facility] [place] NAME: Stephanie Bell    MR#:  [id number]  DATE OF BIRTH:  05-Mar-1941  DATE OF ADMISSION:  12/21/2014  PRIMARY CARE PHYSICIAN: Maryland Pink, MD   REQUESTING/REFERRING PHYSICIAN: Lavonia Drafts  CHIEF COMPLAINT:   Chief Complaint  Patient presents with  . Tachycardia    HISTORY OF PRESENT ILLNESS:  Stephanie Bell  is a 74 y.o. female with a known history of gastroesophageal reflux disease with hiatal hernia repair and esophageal stricture. She was at her dentist office today around 10 AM. Her dentist know and her jaw and 5-10 minutes later she had an extreme fast heartbeat in her legs were shaking and her feet felt numb. The nurse lowered her head back. The dentist came back in her entire body was jerking and she had a hurting in her bilateral arms. She felt woozy when they lifted her up. She was unable to go to the bathroom initially. EMTs were called. In the ER she was found to have a borderline troponin and the second troponin that was slightly higher. The patient does not have any chest pain or shortness of breath. This past week she played 3 hours of competitive tournament Tennis without chest pain or shortness of breath.  PAST MEDICAL HISTORY:   Past Medical History  Diagnosis Date  . Interstitial cystitis   . Hx: UTI (urinary tract infection)   . Weight loss   . Wound disruption, post-op, skin   . Pneumonia 05-09-11    2011 last time  . Paraesophageal hernia   . GERD (gastroesophageal reflux disease)   . H/O hiatal hernia   . Kidney stone   . 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 Esophageal stricture 11/04/2014    PAST SURGICAL HISTORY:   Past Surgical History  Procedure Laterality Date  . Vaginal hysterectomy    . Septoplasty    . Back surgery  05-09-11    x 2 lumbar  . Cholecystectomy  05-09-11    laparoscopic  . Tonsillectomy  05-09-11    Tonsillectomy  . Kidney stone surgery   05-09-11    past hx. and cystoscopy for bladder issues  . Laparoscopic nissen fundoplication  0/24/0973    Procedure: LAPAROSCOPIC NISSEN FUNDOPLICATION;  Surgeon: Pedro Earls, MD;  Location: [facility];  Service: General;  Laterality: N/A;  will also remove sebaceous cyst of chest wall  . Irrigation and debridement sebaceous cyst  05/16/2011    Procedure: IRRIGATION AND DEBRIDEMENT SEBACEOUS CYST;  Surgeon: Pedro Earls, MD;  Location: [facility];  Service: General;  Laterality: N/A;  middle of chest on sternum  . Umbilical hernia repair  05/16/2011    Procedure: HERNIA REPAIR UMBILICAL ADULT;  Surgeon: Pedro Earls, MD;  Location: [facility];  Service: General;  Laterality: N/A;  with mesh   . Hernia repair  05/16/2011    hiatal hernia  . Colonoscopy  2002, 2008     rectal hyperplastic polyp 2002, diverticulosis 2008  . Laparoscopic nissen fundoplication  53/29/9242    Procedure: LAPAROSCOPIC NISSEN FUNDOPLICATION;  Surgeon: Pedro Earls, MD;  [facility];  Service: General;  Laterality: N/A;  Re-do Laparoscopic Nissen   . Hiatal hernia repair  02/09/2012    Procedure: LAPAROSCOPIC REPAIR OF HIATAL HERNIA;  Surgeon: Pedro Earls, MD;  Location: [facility];  Service: General;  Laterality: N/A;  . Insertion of mesh  02/09/2012    Procedure: INSERTION OF MESH;  Surgeon:  Pedro Earls, MD;  Location: [facility];  Service: General;;  . Esophagogastroduodenoscopy      SOCIAL HISTORY:   Social History  Substance Use Topics  . Smoking status: Never Smoker   . Smokeless tobacco: Never Used  . Alcohol Use: 4.2 oz/week    7 Glasses of wine per week    FAMILY HISTORY:   Family History  Problem Relation Age of Onset  . Colon cancer Neg Hx   . Ulcers Paternal Grandmother   . Ulcers Father   . Alcohol abuse Father   . Alzheimer's disease Mother     DRUG ALLERGIES:   Allergies  Allergen Reactions  . Sulfa Antibiotics Swelling and Rash    REVIEW OF SYSTEMS:  CONSTITUTIONAL: No  fever, fatigue or weakness. Weight loss of 60 pounds after hiatal hernia surgery but gained 30 back. EYES: No blurred or double vision.  EARS, NOSE, AND THROAT: No tinnitus or ear pain. No sore throat. Dysphagia to solids RESPIRATORY: No cough, shortness of breath, wheezing or hemoptysis.  CARDIOVASCULAR: No chest pain, orthopnea, edema.  GASTROINTESTINAL: No nausea, vomiting, diarrhea or abdominal pain. No blood in bowel movements GENITOURINARY: No dysuria, hematuria.  ENDOCRINE: No polyuria, nocturia,  HEMATOLOGY: No anemia, easy bruising or bleeding SKIN: No rash or lesion. MUSCULOSKELETAL: No joint pain or arthritis.   NEUROLOGIC: Positive for tingling and numbness of legs.  PSYCHIATRY: No anxiety or depression.   MEDICATIONS AT HOME:   Prior to Admission medications   Medication Sig Start Date End Date Taking? Authorizing Provider  aspirin EC 81 MG tablet Take 81 mg by mouth daily.   Yes Historical Provider, MD  Cholecalciferol (VITAMIN D3) 5000 UNITS CAPS Take 5,000 Units by mouth daily.   Yes Historical Provider, MD  CRANBERRY PO Take 2 capsules by mouth daily.   Yes Historical Provider, MD  methylphenidate (RITALIN) 10 MG tablet Take 10 mg by mouth 2 (two) times daily as needed (when pt is traveling).    Yes Historical Provider, MD  Omeprazole-Sodium Bicarbonate (ZEGERID OTC) 20-1100 MG CAPS capsule Take 1 capsule by mouth daily before breakfast. Patient not taking: Reported on 12/21/2014 11/04/14   Gatha Mayer, MD  ondansetron (ZOFRAN-ODT) 4 MG disintegrating tablet Take 1 tablet (4 mg total) by mouth every 8 (eight) hours as needed for nausea or vomiting. Patient not taking: Reported on 12/21/2014 11/04/14   Gatha Mayer, MD      VITAL SIGNS:  Blood pressure 135/94, pulse 66, temperature 98 F (36.7 C), temperature source Oral, resp. rate 10, height 5\' 5"  (1.651 m), weight 79.379 kg (175 lb), SpO2 95 %.  PHYSICAL EXAMINATION:  GENERAL:  74 y.o.-year-old patient lying in  the bed with no acute distress.  EYES: Pupils equal, round, reactive to light and accommodation. No scleral icterus. Extraocular muscles intact.  HEENT: Head atraumatic, normocephalic. Oropharynx and nasopharynx clear.  NECK:  Supple, no jugular venous distention. No thyroid enlargement, no tenderness.  LUNGS: Normal breath sounds bilaterally, no wheezing, rales,rhonchi or crepitation. No use of accessory muscles of respiration.  CARDIOVASCULAR: S1, S2 normal. No murmurs, rubs, or gallops.  ABDOMEN: Soft, nontender, nondistended. Bowel sounds present. No organomegaly or mass.  EXTREMITIES: Trace edema, no cyanosis, or clubbing.  NEUROLOGIC: Cranial nerves II through XII are intact. Muscle strength 5/5 in all extremities. Sensation intact. Gait not checked.  PSYCHIATRIC: The patient is alert and oriented x 3.  SKIN: No rash, lesion, or ulcer.   LABORATORY PANEL:   CBC  Recent  Labs Lab 12/21/14 1207  WBC 5.5  HGB 14.0  HCT 40.8  PLT 160   ------------------------------------------------------------------------------------------------------------------  Chemistries   Recent Labs Lab 12/21/14 1207  NA 138  K 3.5  CL 104  CO2 26  GLUCOSE 98  BUN 7  CREATININE 0.76  CALCIUM 9.1  AST 19  ALT 12*  ALKPHOS 73  BILITOT 1.1   ------------------------------------------------------------------------------------------------------------------  Cardiac Enzymes  Recent Labs Lab 12/21/14 1437  TROPONINI 0.23*   ------------------------------------------------------------------------------------------------------------------  EKG:   Normal sinus rhythm flattening of T waves laterally  IMPRESSION AND PLAN:   1. Allergic reaction secondary to dental procedure causing severe palpitations which then caused demand ischemia and borderline elevated troponin. The patient does not have any chest pain or shortness of breath. She would like to go home. I put in the discharge order  home. She takes an aspirin on a daily basis. Since she has good exercise capacity playing tennis, I do not think this is a heart attack. Patient is currently feeling fine and the reaction is subsided. 2. Gastroesophageal reflux disease, hiatal hernia history and esophageal stricture- patient take Zegerid at home   All the records are reviewed and case discussed with ED provider. Management plans discussed with the patient, family and they are in agreement.  CODE STATUS: Full code  TOTAL TIME TAKING CARE OF THIS PATIENT: 60 minutes.    Loletha Grayer M.D on 12/21/2014 at 4:18 PM  Between 7am to 6pm - Pager - [phone]  After 6pm call admission pager [facility]  Office  [phone]  CC: Primary care physician; Maryland Pink, MD

## 2014-12-21 NOTE — ED Notes (Signed)
Pt was at dentist office for dental procedure when she was injected with numbing medication, Scandonest 2%, into her gums x2 injections .  Pt reports having bilateral leg numbness, racing heart and sudden urge to urinate.  Pt also reports nausea directly after receiving medication.  On arrival, pt slightly anxious and with headache.

## 2014-12-21 NOTE — ED Notes (Signed)
MD at bedside. 

## 2015-01-22 ENCOUNTER — Ambulatory Visit (INDEPENDENT_AMBULATORY_CARE_PROVIDER_SITE_OTHER): Payer: Medicare Other | Admitting: Cardiovascular Disease

## 2015-01-22 ENCOUNTER — Encounter: Payer: Self-pay | Admitting: Cardiovascular Disease

## 2015-01-22 VITALS — BP 124/90 | HR 76 | Ht 65.0 in | Wt 179.8 lb

## 2015-01-22 DIAGNOSIS — R7989 Other specified abnormal findings of blood chemistry: Secondary | ICD-10-CM | POA: Diagnosis not present

## 2015-01-22 DIAGNOSIS — I471 Supraventricular tachycardia: Secondary | ICD-10-CM | POA: Diagnosis not present

## 2015-01-22 DIAGNOSIS — R778 Other specified abnormalities of plasma proteins: Secondary | ICD-10-CM

## 2015-01-22 NOTE — Progress Notes (Signed)
Cardiology Office Note   Date:  01/22/2015   ID:  Stephanie Bell, Stephanie Bell September 03, 1940, MRN [id number]  PCP:  Maryland Pink, MD  Cardiologist:   Thayer Headings, MD   Chief Complaint  Patient presents with  . Chest Pain   Problem List 1. Tachycardia  2. Troponin leak - following dental procedure with Lidocaine with Epi   History of Present Illness: Stephanie Bell is a 74 y.o. female who presents for evaluation of a mild Troponin leak following a dental procedure with Lidocaine with Epi.  On Oct. 3 - had a dental procedure Plays [employer] regularly .  Plays without any complications.  Played this past week for several hours without difficulty .   Very active, no CP or dyspnea Eats a reasonable diet.        Past Medical History  Diagnosis Date  . Interstitial cystitis   . Hx: UTI (urinary tract infection)   . Weight loss   . Wound disruption, post-op, skin   . Pneumonia 05-09-11    2011 last time  . Paraesophageal hernia   . GERD (gastroesophageal reflux disease)   . H/O hiatal hernia   . Kidney stone   . 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 Esophageal stricture 11/04/2014    Past Surgical History  Procedure Laterality Date  . Vaginal hysterectomy    . Septoplasty    . Back surgery  05-09-11    x 2 lumbar  . Cholecystectomy  05-09-11    laparoscopic  . Tonsillectomy  05-09-11    Tonsillectomy  . Kidney stone surgery  05-09-11    past hx. and cystoscopy for bladder issues  . Laparoscopic nissen fundoplication  3/95/3202    Procedure: LAPAROSCOPIC NISSEN FUNDOPLICATION;  Surgeon: Pedro Earls, MD;  Location: [facility];  Service: General;  Laterality: N/A;  will also remove sebaceous cyst of chest wall  . Irrigation and debridement sebaceous cyst  05/16/2011    Procedure: IRRIGATION AND DEBRIDEMENT SEBACEOUS CYST;  Surgeon: Pedro Earls, MD;  Location: [facility];  Service: General;  Laterality: N/A;  middle of chest on sternum  .  Umbilical hernia repair  05/16/2011    Procedure: HERNIA REPAIR UMBILICAL ADULT;  Surgeon: Pedro Earls, MD;  Location: [facility];  Service: General;  Laterality: N/A;  with mesh   . Hernia repair  05/16/2011    hiatal hernia  . Colonoscopy  2002, 2008     rectal hyperplastic polyp 2002, diverticulosis 2008  . Laparoscopic nissen fundoplication  33/43/5686    Procedure: LAPAROSCOPIC NISSEN FUNDOPLICATION;  Surgeon: Pedro Earls, MD;  Location: [facility];  Service: General;  Laterality: N/A;  Re-do Laparoscopic Nissen   . Hiatal hernia repair  02/09/2012    Procedure: LAPAROSCOPIC REPAIR OF HIATAL HERNIA;  Surgeon: Pedro Earls, MD;  Location: [facility];  Service: General;  Laterality: N/A;  . Insertion of mesh  02/09/2012    Procedure: INSERTION OF MESH;  Surgeon: Pedro Earls, MD;  Location: [facility];  Service: General;;  . Esophagogastroduodenoscopy       Current Outpatient Prescriptions  Medication Sig Dispense Refill  . aspirin EC 81 MG tablet Take 81 mg by mouth daily.    . Cholecalciferol (VITAMIN D3) 5000 UNITS CAPS Take 5,000 Units by mouth daily.    Marland Kitchen CRANBERRY PO Take 2 capsules by mouth daily.    . methylphenidate (RITALIN) 10 MG tablet Take 10 mg by mouth 2 (two)  times daily as needed (when pt is traveling).     . ondansetron (ZOFRAN-ODT) 4 MG disintegrating tablet Take 1 tablet (4 mg total) by mouth every 8 (eight) hours as needed for nausea or vomiting. 30 tablet 11   No current facility-administered medications for this visit.    Allergies:   Sulfa antibiotics    Social History:  The patient  reports that she has never smoked. She has never used smokeless tobacco. She reports that she drinks about 4.2 oz of alcohol per week. She reports that she does not use illicit drugs.   Family History:  The patient's family history includes Alcohol abuse in her father; Alzheimer's disease in her mother; Ulcers in her father and paternal grandmother. There is no history of Colon  cancer.    ROS:  Please see the history of present illness.    Review of Systems: Constitutional:  denies fever, chills, diaphoresis, appetite change and fatigue.  HEENT: denies photophobia, eye pain, redness, hearing loss, ear pain, congestion, sore throat, rhinorrhea, sneezing, neck pain, neck stiffness and tinnitus.  Respiratory: denies SOB, DOE, cough, chest tightness, and wheezing.  Cardiovascular: denies chest pain, palpitations and leg swelling.  Gastrointestinal: denies nausea, vomiting, abdominal pain, diarrhea, constipation, blood in stool.  Genitourinary: denies dysuria, urgency, frequency, hematuria, flank pain and difficulty urinating.  Musculoskeletal: denies  myalgias, back pain, joint swelling, arthralgias and gait problem.   Skin: denies pallor, rash and wound.  Neurological: denies dizziness, seizures, syncope, weakness, light-headedness, numbness and headaches.   Hematological: denies adenopathy, easy bruising, personal or family bleeding history.  Psychiatric/ Behavioral: denies suicidal ideation, mood changes, confusion, nervousness, sleep disturbance and agitation.       All other systems are reviewed and negative.    PHYSICAL EXAM: VS:  BP 124/90 mmHg  Pulse 76  Ht 5\' 5"  (1.651 m)  Wt 179 lb 12.8 oz (81.557 kg)  BMI 29.92 kg/m2 , BMI Body mass index is 29.92 kg/(m^2). GEN: Well nourished, well developed, in no acute distress HEENT: normal Neck: no JVD, carotid bruits, or masses Cardiac: RRR; no murmurs, rubs, or gallops,no edema  Respiratory:  clear to auscultation bilaterally, normal work of breathing GI: soft, nontender, nondistended, + BS MS: no deformity or atrophy Skin: warm and dry, no rash Neuro:  Strength and sensation are intact Psych: normal   EKG:  EKG is not ordered today.    Recent Labs: 12/21/2014: ALT 12*; BUN 7; Creatinine, Ser 0.76; Hemoglobin 14.0; Platelets 160; Potassium 3.5; Sodium 138    Lipid Panel No results found for:  CHOL, TRIG, HDL, CHOLHDL, VLDL, LDLCALC, LDLDIRECT    Wt Readings from Last 3 Encounters:  01/22/15 179 lb 12.8 oz (81.557 kg)  12/21/14 175 lb (79.379 kg)  11/04/14 184 lb (83.462 kg)      Other studies Reviewed: Additional studies/ records that were reviewed today include: . Review of the above records demonstrates:    ASSESSMENT AND PLAN:  1.  Troponin elevation:  Bryona had an acute reaction to lidocaine with epinephrine. She developed tachycardia as well as probable coronary spasm. She had a mild troponin elevation.  She's been a full recovery and has not had any further episodes of chest discomfort.  She plays competitive tennis at least once or twice week. She's not had any previous episodes of chest discomfort.  For further evaluation would like to do an echocardiogram to violate her left ventricular function. I would also like to do an exercise Myoview study to evaluate her  for the possibility of coronary ischemia. We will see her on an as-needed basis.  Current medicines are reviewed at length with the patient today.  The patient does not have concerns regarding medicines.  The following changes have been made:  no change  Labs/ tests ordered today include:  No orders of the defined types were placed in this encounter.     Disposition:   FU with me as needed.      Nahser, Wonda Cheng, MD  01/22/2015 10:16 AM    [facility] [place], [place], [place]  [address] Phone: [phone]; Fax: [phone]   [facility]  [address] [place], [place]  [address] [phone]   Fax [phone]

## 2015-01-22 NOTE — Patient Instructions (Signed)
Medication Instructions:  Your physician recommends that you continue on your current medications as directed. Please refer to the Current Medication list given to you today.   Labwork: None Ordered   Testing/Procedures: Your physician has requested that you have an echocardiogram. Echocardiography is a painless test that uses sound waves to create images of your heart. It provides your doctor with information about the size and shape of your heart and how well your heart's chambers and valves are working. This procedure takes approximately one hour. There are no restrictions for this procedure.  Your physician has requested that you have an exercise stress myoview. For further information please visit [email or web address]. Please follow instruction sheet, as given.   Follow-Up: Your physician recommends that you schedule a follow-up appointment in: as needed with Dr. Acie Fredrickson.   If you need a refill on your cardiac medications before your next appointment, please call your pharmacy.   Thank you for choosing [facility]! Christen Bame, RN [phone]

## 2015-01-27 ENCOUNTER — Telehealth (HOSPITAL_COMMUNITY): Payer: Self-pay | Admitting: *Deleted

## 2015-01-27 NOTE — Telephone Encounter (Signed)
Patient given detailed instructions per Myocardial Perfusion Study Information Sheet for the test on 01/29/15 at 715 Patient notified to arrive 15 minutes early and that it is imperative to arrive on time for appointment to keep from having the test rescheduled.  If you need to cancel or reschedule your appointment, please call the office within 24 hours of your appointment. Failure to do so may result in a cancellation of your appointment, and a $50 no show fee. Patient verbalized understanding.Cresenciano Lick Aaryan Essman,RN

## 2015-01-29 ENCOUNTER — Other Ambulatory Visit: Payer: Self-pay

## 2015-01-29 ENCOUNTER — Ambulatory Visit (HOSPITAL_BASED_OUTPATIENT_CLINIC_OR_DEPARTMENT_OTHER): Payer: Medicare Other

## 2015-01-29 ENCOUNTER — Ambulatory Visit (HOSPITAL_COMMUNITY): Payer: Medicare Other | Attending: Cardiology

## 2015-01-29 DIAGNOSIS — I071 Rheumatic tricuspid insufficiency: Secondary | ICD-10-CM | POA: Insufficient documentation

## 2015-01-29 DIAGNOSIS — I371 Nonrheumatic pulmonary valve insufficiency: Secondary | ICD-10-CM | POA: Diagnosis not present

## 2015-01-29 DIAGNOSIS — I471 Supraventricular tachycardia: Secondary | ICD-10-CM | POA: Diagnosis present

## 2015-01-29 DIAGNOSIS — R7989 Other specified abnormal findings of blood chemistry: Secondary | ICD-10-CM | POA: Diagnosis not present

## 2015-01-29 DIAGNOSIS — R079 Chest pain, unspecified: Secondary | ICD-10-CM | POA: Diagnosis not present

## 2015-01-29 DIAGNOSIS — R778 Other specified abnormalities of plasma proteins: Secondary | ICD-10-CM

## 2015-01-29 DIAGNOSIS — I517 Cardiomegaly: Secondary | ICD-10-CM | POA: Diagnosis not present

## 2015-01-29 DIAGNOSIS — I340 Nonrheumatic mitral (valve) insufficiency: Secondary | ICD-10-CM | POA: Insufficient documentation

## 2015-01-29 DIAGNOSIS — R002 Palpitations: Secondary | ICD-10-CM | POA: Diagnosis not present

## 2015-01-29 LAB — MYOCARDIAL PERFUSION IMAGING
CHL CUP NUCLEAR SRS: 0
CHL CUP NUCLEAR SSS: 3
LV dias vol: 60 mL
LV sys vol: 18 mL
Peak HR: 103 {beats}/min
RATE: 0.3
Rest HR: 72 {beats}/min
SDS: 3
TID: 0.93

## 2015-01-29 MED ORDER — REGADENOSON 0.4 MG/5ML IV SOLN
0.4000 mg | Freq: Once | INTRAVENOUS | Status: AC
  Administered 2015-01-29: 0.4 mg via INTRAVENOUS

## 2015-01-29 MED ORDER — TECHNETIUM TC 99M SESTAMIBI GENERIC - CARDIOLITE
30.7000 | Freq: Once | INTRAVENOUS | Status: AC | PRN
  Administered 2015-01-29: 30.7 via INTRAVENOUS

## 2015-01-29 MED ORDER — TECHNETIUM TC 99M SESTAMIBI GENERIC - CARDIOLITE
10.7000 | Freq: Once | INTRAVENOUS | Status: AC | PRN
  Administered 2015-01-29: 11 via INTRAVENOUS

## 2015-02-09 ENCOUNTER — Telehealth: Payer: Self-pay | Admitting: Cardiovascular Disease

## 2015-02-09 NOTE — Telephone Encounter (Signed)
Left message for patient to call back  

## 2015-02-09 NOTE — Telephone Encounter (Signed)
New MEssage  Pt requested to speak w. RN- would not specify nature of the call.

## 2015-02-09 NOTE — Telephone Encounter (Signed)
Spoke with patient who states she got a blood pressure monitor and most recent readings have been [id number] systolic, [id number] diastolic.  She states she had one reading 169/102 mmHg.  She is concerned because she has never had high blood pressure in the past.  BP at time of office visit She states she is exercising and does not eat a high sodium diet.  I advised her that I will forward to Dr. Acie Fredrickson for advice.  She verbalized understanding and agreement.

## 2015-02-09 NOTE — Telephone Encounter (Signed)
Please ask her to monitor her BP for several weeks.  BP was normal at her office visit  She may continue to exercise and she should watch her salt intake  Report back to [place] with several weeks of BP readings.

## 2015-02-09 NOTE — Telephone Encounter (Signed)
Follow up      Pt is calling to talk to the nurse.  She would not tell me what she wanted. However, she did say she was not having an emergency.  Please call

## 2015-02-10 NOTE — Telephone Encounter (Signed)
Called patient about Dr. Acie Fredrickson advisement. Patient will watch her salt intake and will report her BPs in a several weeks.

## 2015-02-21 ENCOUNTER — Emergency Department (HOSPITAL_COMMUNITY): Payer: Medicare Other

## 2015-02-21 ENCOUNTER — Observation Stay (HOSPITAL_COMMUNITY)
Admission: EM | Admit: 2015-02-21 | Discharge: 2015-02-22 | Disposition: A | Discharge status: Home / Self Care | Payer: Medicare Other | Attending: Internal Medicine | Admitting: Internal Medicine

## 2015-02-21 ENCOUNTER — Encounter (HOSPITAL_COMMUNITY): Payer: Self-pay | Admitting: Emergency Medicine

## 2015-02-21 DIAGNOSIS — K219 Gastro-esophageal reflux disease without esophagitis: Secondary | ICD-10-CM | POA: Diagnosis present

## 2015-02-21 DIAGNOSIS — K222 Esophageal obstruction: Secondary | ICD-10-CM

## 2015-02-21 DIAGNOSIS — K225 Diverticulum of esophagus, acquired: Secondary | ICD-10-CM

## 2015-02-21 DIAGNOSIS — Z7982 Long term (current) use of aspirin: Secondary | ICD-10-CM | POA: Insufficient documentation

## 2015-02-21 DIAGNOSIS — Z90710 Acquired absence of both cervix and uterus: Secondary | ICD-10-CM | POA: Diagnosis not present

## 2015-02-21 DIAGNOSIS — R0602 Shortness of breath: Secondary | ICD-10-CM | POA: Insufficient documentation

## 2015-02-21 DIAGNOSIS — E785 Hyperlipidemia, unspecified: Secondary | ICD-10-CM | POA: Insufficient documentation

## 2015-02-21 DIAGNOSIS — Z9889 Other specified postprocedural states: Secondary | ICD-10-CM | POA: Diagnosis present

## 2015-02-21 DIAGNOSIS — I252 Old myocardial infarction: Secondary | ICD-10-CM | POA: Insufficient documentation

## 2015-02-21 DIAGNOSIS — Z79899 Other long term (current) drug therapy: Secondary | ICD-10-CM | POA: Diagnosis not present

## 2015-02-21 DIAGNOSIS — R0789 Other chest pain: Secondary | ICD-10-CM | POA: Diagnosis not present

## 2015-02-21 DIAGNOSIS — R079 Chest pain, unspecified: Secondary | ICD-10-CM

## 2015-02-21 DIAGNOSIS — Z9049 Acquired absence of other specified parts of digestive tract: Secondary | ICD-10-CM | POA: Diagnosis not present

## 2015-02-21 LAB — LIPID PANEL
Cholesterol: 204 mg/dL — ABNORMAL HIGH (ref 0–200)
HDL: 50 mg/dL (ref >40)
LDL CALC: 131 mg/dL — AB (ref 0–99)
TRIGLYCERIDES: 117 mg/dL (ref <150)
Total CHOL/HDL Ratio: 4.1 RATIO
VLDL: 23 mg/dL (ref 0–40)

## 2015-02-21 LAB — BASIC METABOLIC PANEL
Anion gap: 8 (ref 5–15)
BUN: 8 mg/dL (ref 6–20)
CALCIUM: 9.2 mg/dL (ref 8.9–10.3)
CO2: 24 mmol/L (ref 22–32)
CREATININE: 0.72 mg/dL (ref 0.44–1.00)
Chloride: 108 mmol/L (ref 101–111)
GFR calc Af Amer: >60 mL/min (ref >60)
GFR calc non Af Amer: >60 mL/min (ref >60)
GLUCOSE: 113 mg/dL — AB (ref 65–99)
Potassium: 3.4 mmol/L — ABNORMAL LOW (ref 3.5–5.1)
Sodium: 140 mmol/L (ref 135–145)

## 2015-02-21 LAB — COMPREHENSIVE METABOLIC PANEL
ALBUMIN: 3.5 g/dL (ref 3.5–5.0)
ALK PHOS: 57 U/L (ref 38–126)
ALT: 14 U/L (ref 14–54)
AST: 20 U/L (ref 15–41)
Anion gap: 4 — ABNORMAL LOW (ref 5–15)
BUN: 8 mg/dL (ref 6–20)
CALCIUM: 8.7 mg/dL — AB (ref 8.9–10.3)
CO2: 26 mmol/L (ref 22–32)
CREATININE: 0.65 mg/dL (ref 0.44–1.00)
Chloride: 108 mmol/L (ref 101–111)
GFR calc Af Amer: >60 mL/min (ref >60)
GFR calc non Af Amer: >60 mL/min (ref >60)
GLUCOSE: 107 mg/dL — AB (ref 65–99)
Potassium: 3.3 mmol/L — ABNORMAL LOW (ref 3.5–5.1)
SODIUM: 138 mmol/L (ref 135–145)
Total Bilirubin: 0.9 mg/dL (ref 0.3–1.2)
Total Protein: 5.9 g/dL — ABNORMAL LOW (ref 6.5–8.1)

## 2015-02-21 LAB — CBC
HCT: 37.9 % (ref 36.0–46.0)
HCT: 35.7 % — ABNORMAL LOW (ref 36.0–46.0)
HEMOGLOBIN: 13.2 g/dL (ref 12.0–15.0)
Hemoglobin: 12.3 g/dL (ref 12.0–15.0)
MCH: 28.8 pg (ref 26.0–34.0)
MCH: 29 pg (ref 26.0–34.0)
MCHC: 34.8 g/dL (ref 30.0–36.0)
MCHC: 34.5 g/dL (ref 30.0–36.0)
MCV: 82.6 fL (ref 78.0–100.0)
MCV: 84.2 fL (ref 78.0–100.0)
PLATELETS: 136 10*3/uL — AB (ref 150–400)
PLATELETS: 128 10*3/uL — AB (ref 150–400)
RBC: 4.59 MIL/uL (ref 3.87–5.11)
RBC: 4.24 MIL/uL (ref 3.87–5.11)
RDW: 13.1 % (ref 11.5–15.5)
RDW: 13.2 % (ref 11.5–15.5)
WBC: 6.2 10*3/uL (ref 4.0–10.5)
WBC: 5.3 10*3/uL (ref 4.0–10.5)

## 2015-02-21 LAB — TROPONIN I: Troponin I: <0.03 ng/mL (ref <0.031)

## 2015-02-21 LAB — PROTIME-INR
INR: 0.97 (ref 0.00–1.49)
PROTHROMBIN TIME: 13.1 s (ref 11.6–15.2)

## 2015-02-21 LAB — GLUCOSE, CAPILLARY: Glucose-Capillary: 104 mg/dL — ABNORMAL HIGH (ref 65–99)

## 2015-02-21 LAB — APTT: aPTT: 30 seconds (ref 24–37)

## 2015-02-21 LAB — I-STAT TROPONIN, ED: TROPONIN I, POC: 0.01 ng/mL (ref 0.00–0.08)

## 2015-02-21 LAB — BRAIN NATRIURETIC PEPTIDE: B Natriuretic Peptide: 92.8 pg/mL (ref 0.0–100.0)

## 2015-02-21 MED ORDER — POTASSIUM CHLORIDE CRYS ER 20 MEQ PO TBCR
40.0000 meq | EXTENDED_RELEASE_TABLET | Freq: Once | ORAL | Status: AC
  Administered 2015-02-21: 40 meq via ORAL
  Filled 2015-02-21: qty 2

## 2015-02-21 MED ORDER — PANTOPRAZOLE SODIUM 40 MG PO TBEC
40.0000 mg | DELAYED_RELEASE_TABLET | Freq: Every day | ORAL | Status: DC
  Filled 2015-02-21: qty 1

## 2015-02-21 MED ORDER — HYDRALAZINE HCL 20 MG/ML IJ SOLN
10.0000 mg | Freq: Four times a day (QID) | INTRAMUSCULAR | Status: DC | PRN
Start: 2015-02-21 — End: 2015-02-22

## 2015-02-21 MED ORDER — ACETAMINOPHEN 650 MG RE SUPP: 650.0000 mg | Freq: Four times a day (QID) | RECTAL | Status: DC | PRN

## 2015-02-21 MED ORDER — SODIUM CHLORIDE 0.9 % IJ SOLN
3.0000 mL | Freq: Two times a day (BID) | INTRAMUSCULAR | Status: DC
  Administered 2015-02-21 (×2): 3 mL via INTRAVENOUS

## 2015-02-21 MED ORDER — ADULT MULTIVITAMIN W/MINERALS CH
1.0000 | ORAL_TABLET | Freq: Every day | ORAL | Status: DC
Start: 2015-02-21 — End: 2015-02-22
  Filled 2015-02-21 (×2): qty 1

## 2015-02-21 MED ORDER — ASPIRIN 325 MG PO TABS
325.0000 mg | ORAL_TABLET | Freq: Every day | ORAL | Status: DC
  Administered 2015-02-21: 325 mg via ORAL
  Filled 2015-02-21 (×2): qty 1

## 2015-02-21 MED ORDER — ONDANSETRON 4 MG PO TBDP: 4.0000 mg | ORAL_TABLET | Freq: Three times a day (TID) | ORAL | Status: DC | PRN

## 2015-02-21 MED ORDER — NITROGLYCERIN 0.4 MG SL SUBL
0.4000 mg | SUBLINGUAL_TABLET | Freq: Once | SUBLINGUAL | Status: DC
  Filled 2015-02-21: qty 1

## 2015-02-21 MED ORDER — NITROGLYCERIN 0.4 MG SL SUBL: 0.4000 mg | SUBLINGUAL_TABLET | SUBLINGUAL | Status: DC | PRN

## 2015-02-21 MED ORDER — ALUM & MAG HYDROXIDE-SIMETH 200-200-20 MG/5ML PO SUSP: 30.0000 mL | Freq: Four times a day (QID) | ORAL | Status: DC | PRN

## 2015-02-21 MED ORDER — MORPHINE SULFATE (PF) 2 MG/ML IV SOLN: 2.0000 mg | INTRAVENOUS | Status: DC | PRN

## 2015-02-21 MED ORDER — ATORVASTATIN CALCIUM 10 MG PO TABS
10.0000 mg | ORAL_TABLET | Freq: Every day | ORAL | Status: DC
  Filled 2015-02-21 (×2): qty 1

## 2015-02-21 MED ORDER — ACETAMINOPHEN 325 MG PO TABS: 650.0000 mg | ORAL_TABLET | Freq: Four times a day (QID) | ORAL | Status: DC | PRN

## 2015-02-21 MED ORDER — HEPARIN SODIUM (PORCINE) 5000 UNIT/ML IJ SOLN
5000.0000 [IU] | Freq: Three times a day (TID) | INTRAMUSCULAR | Status: DC
  Administered 2015-02-21: 5000 [IU] via SUBCUTANEOUS
  Filled 2015-02-21 (×5): qty 1

## 2015-02-21 MED ORDER — CRANBERRY 300 MG PO TABS: 1.0000 | ORAL_TABLET | Freq: Every day | ORAL | Status: DC

## 2015-02-21 MED ORDER — VITAMIN D 1000 UNITS PO TABS
5000.0000 [IU] | ORAL_TABLET | Freq: Every day | ORAL | Status: DC
  Administered 2015-02-21: 5000 [IU] via ORAL
  Filled 2015-02-21 (×2): qty 5

## 2015-02-21 NOTE — H&P (Addendum)
[facility] History and Physical  Stephanie Bell [id number] DOB: 1940/09/21 DOA: 02/21/2015  Referring physician: ED physician PCP: Maryland Pink, MD  Specialists:   Chief Complaint: chest tightness  HPI: Stephanie Bell is a 74 y.o. female with PMH of hiatal hernia, esophageal stricture, esophageal Zenker diverticula, s/p of LAPAROSCOPIC NISSEN FUNDOPLICATION, SVT, interstitial cystitis, who presents with chest tightness.  Patient reports that she was waken up by chest tightness about 11:30 last night. It was associated with mild shortness of breath and nausea. No fever, chills, cough. It lasted for about 3 hours, and then resolved spontaneously. Currently no chest tightness, shortness of breath, chest pain. Patient denies abdominal pain, diarrhea, symptoms of UTI, unilateral weakness.  In ED, patient was found to have negative troponin, WBC 6.2, temperature normal, no tachycardia, potassium is 3.4, renal function okay. Chest x-ray showed hyperinflation, but no infiltration. Patient is admitted to inpatient for further evaluation treatment.  Where does patient live?   At home  Can patient participate in ADLs?  Yes    Review of Systems:   General: no fevers, chills, no changes in body weight, has fatigue HEENT: no blurry vision, hearing changes or sore throat Pulm: no dyspnea, coughing, wheezing CV: has chest tightness, no palpitations Abd: has nausea, no vomiting, abdominal pain, diarrhea, constipation GU: no dysuria, burning on urination, increased urinary frequency, hematuria  Ext: no leg edema Neuro: no unilateral weakness, numbness, or tingling, no vision change or hearing loss Skin: no rash MSK: No muscle spasm, no deformity, no limitation of range of movement in spin Heme: No easy bruising.  Travel history: No recent long distant travel.  Allergy:  Allergies  Allergen Reactions  . Epinephrine Hcl Palpitations and Other (See Comments)    tachycardia   . Sulfa  Antibiotics Swelling and Rash    Past Medical History  Diagnosis Date  . Interstitial cystitis   . Hx: UTI (urinary tract infection)   . Weight loss   . Wound disruption, post-op, skin   . Pneumonia 05-09-11    2011 last time  . Paraesophageal hernia   . GERD (gastroesophageal reflux disease)   . H/O hiatal hernia   . Kidney stone   . 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 Esophageal stricture 11/04/2014    Past Surgical History  Procedure Laterality Date  . Vaginal hysterectomy    . Septoplasty    . Back surgery  05-09-11    x 2 lumbar  . Cholecystectomy  05-09-11    laparoscopic  . Tonsillectomy  05-09-11    Tonsillectomy  . Kidney stone surgery  05-09-11    past hx. and cystoscopy for bladder issues  . Laparoscopic nissen fundoplication  0000000    Procedure: LAPAROSCOPIC NISSEN FUNDOPLICATION;  Surgeon: Pedro Earls, MD;  Location: [facility];  Service: General;  Laterality: N/A;  will also remove sebaceous cyst of chest wall  . Irrigation and debridement sebaceous cyst  05/16/2011    Procedure: IRRIGATION AND DEBRIDEMENT SEBACEOUS CYST;  Surgeon: Pedro Earls, MD;  Location: [facility];  Service: General;  Laterality: N/A;  middle of chest on sternum  . Umbilical hernia repair  05/16/2011    Procedure: HERNIA REPAIR UMBILICAL ADULT;  Surgeon: Pedro Earls, MD;  Location: [facility];  Service: General;  Laterality: N/A;  with mesh   . Hernia repair  05/16/2011    hiatal hernia  . Colonoscopy  2002, 2008     rectal hyperplastic polyp 2002, diverticulosis 2008  .  Laparoscopic nissen fundoplication  [id number]    Procedure: LAPAROSCOPIC NISSEN FUNDOPLICATION;  Surgeon: Pedro Earls, MD;  Location: [facility];  Service: General;  Laterality: N/A;  Re-do Laparoscopic Nissen   . Hiatal hernia repair  02/09/2012    Procedure: LAPAROSCOPIC REPAIR OF HIATAL HERNIA;  Surgeon: Pedro Earls, MD;  Location: [facility];  Service: General;  Laterality: N/A;  . Insertion of mesh   02/09/2012    Procedure: INSERTION OF MESH;  Surgeon: Pedro Earls, MD;  Location: [facility];  Service: General;;  . Esophagogastroduodenoscopy      Social History:  reports that she has never smoked. She has never used smokeless tobacco. She reports that she drinks about 4.2 oz of alcohol per week. She reports that she does not use illicit drugs.  Family History:  Family History  Problem Relation Age of Onset  . Colon cancer Neg Hx   . Ulcers Paternal Grandmother   . Ulcers Father   . Alcohol abuse Father   . Alzheimer's disease Mother      Prior to Admission medications   Medication Sig Start Date End Date Taking? Authorizing Provider  aspirin 325 MG tablet Take 325 mg by mouth once.   Yes Historical Provider, MD  aspirin EC 81 MG tablet Take 81 mg by mouth daily.   Yes Historical Provider, MD  Cholecalciferol (VITAMIN D3) 5000 UNITS CAPS Take 5,000 Units by mouth daily.   Yes Historical Provider, MD  CRANBERRY PO Take 2 capsules by mouth daily.   Yes Historical Provider, MD  ibuprofen (ADVIL,MOTRIN) 200 MG tablet Take 400 mg by mouth every 6 (six) hours as needed for moderate pain.   Yes Historical Provider, MD  Multiple Vitamin (MULTIVITAMIN WITH MINERALS) TABS tablet Take 1 tablet by mouth daily.   Yes Historical Provider, MD  ondansetron (ZOFRAN-ODT) 4 MG disintegrating tablet Take 1 tablet (4 mg total) by mouth every 8 (eight) hours as needed for nausea or vomiting. 11/04/14  Yes Gatha Mayer, MD    Physical Exam: Filed Vitals:   02/21/15 0110  BP: 159/99  Pulse: 64  Temp: 97.9 F (36.6 C)  TempSrc: Oral  Resp: 18  SpO2: 98%   General: Not in acute distress HEENT:       Eyes: PERRL, EOMI, no scleral icterus.       ENT: No discharge from the ears and nose, no pharynx injection, no tonsillar enlargement.        Neck: No JVD, no bruit, no mass felt. Heme: No neck lymph node enlargement. Cardiac: S1/S2, RRR, No murmurs, No gallops or rubs. Pulm:  No rales, wheezing,  rhonchi or rubs. Abd: Soft, nondistended, nontender, no rebound pain, no organomegaly, BS present. Ext: No pitting leg edema bilaterally. 2+DP/PT pulse bilaterally. Musculoskeletal: No joint deformities, No joint redness or warmth, no limitation of ROM in spin. Skin: No rashes.  Neuro: Alert, oriented X3, cranial nerves II-XII grossly intact, muscle strength 5/5 in all extremities, sensation to light touch intact. Psych: Patient is not psychotic, no suicidal or hemocidal ideation.  Labs on Admission:  Basic Metabolic Panel:  Recent Labs Lab 02/21/15 0138  NA 140  K 3.4*  CL 108  CO2 24  GLUCOSE 113*  BUN 8  CREATININE 0.72  CALCIUM 9.2   Liver Function Tests: No results for input(s): AST, ALT, ALKPHOS, BILITOT, PROT, ALBUMIN in the last 168 hours. No results for input(s): LIPASE, AMYLASE in the last 168 hours. No results for input(s): AMMONIA in  the last 168 hours. CBC:  Recent Labs Lab 02/21/15 0138  WBC 6.2  HGB 13.2  HCT 37.9  MCV 82.6  PLT 136*   Cardiac Enzymes: No results for input(s): CKTOTAL, CKMB, CKMBINDEX, TROPONINI in the last 168 hours.  BNP (last 3 results) No results for input(s): BNP in the last 8760 hours.  ProBNP (last 3 results) No results for input(s): PROBNP in the last 8760 hours.  CBG: No results for input(s): GLUCAP in the last 168 hours.  Radiological Exams on Admission: Dg Chest 2 View  02/21/2015  CLINICAL DATA:  Central chest pain which awoke her from sleep. Dyspnea. EXAM: CHEST  2 VIEW COMPARISON:  09/04/2014 FINDINGS: There is moderate hyperinflation. The lungs are clear. There is no pleural effusion. Hilar, mediastinal and cardiac contours are unremarkable and unchanged. Pulmonary vasculature is normal. IMPRESSION: Hyperinflation. Electronically Signed   By: Andreas Newport M.D.   On: 02/21/2015 02:14    EKG: Independently reviewed.  QTC 431, diffused T-wave flattening, which has no significant change compared with previous EKG  on 12/21/14.  Assessment/Plan Principal Problem:   Chest tightness Active Problems:   S/P Nissen fundoplication (without gastrostomy tube) procedure   Zenker's hypopharyngeal diverticulum   Esophageal stricture   GERD (gastroesophageal reflux disease)  Chest tightness: very atypical. Her chest tightness and SOB have resolved. Currently no chest pain, shortness of breath. Patient has very significant history of esophageal problems, which could be the cause of tight tightness, but still need to do chest pain r/o.   - will admit to Tele bed for observation.  - cycle CE q6 x3 and repeat her EKG in the am  - Nitroglycerin, Morphine, and aspirin - Risk factor stratification: will check FLP and A1C  - Consider cardiology consult if test positive for CEs  - 2d echo - Protonix and Mylanta for acid reflux  GERD: -Protonix  Esophageal stricture: Patient has been followed up by GI, dr. Carlean Purl. Last seen was on 11/04/14. -Follow-up with the GI   DVT ppx: SQ Heparin   Code Status: Full code Family Communication:  Yes, patient's husband at bed side Disposition Plan: Admit to inpatient   Date of Service 02/21/2015    Ivor Costa [facility] Pager [phone]  If 7PM-7AM, please contact night-coverage [email or web address] Password [email or web address] 02/21/2015, 5:11 AM

## 2015-02-21 NOTE — ED Provider Notes (Signed)
CSN: [id number]     Arrival date & time 02/21/15  0108 History  By signing my name below, I, Stephania Fragmin, attest that this documentation has been prepared under the direction and in the presence of Orpah Greek, MD. Electronically Signed: Stephania Fragmin, ED Scribe. 02/21/2015. 2:34 AM.   Chief Complaint  Patient presents with  . Chest Pain   The history is provided by the patient and medical records. No language interpreter was used.    HPI Comments: Stephanie Bell is a 74 y.o. female who presents to the Emergency Department complaining of intermittent tight central chest pain that woke her up from sleep PTA. She also complains of associated SOB and nausea. She states that she had been to the ED 10/3 (for NSTEMI, per medical records) after receiving a lidocaine with epinephrine shot at the dentist's office, and began developing tachycardia. She followed up with cardiologist Dr. Acie Fredrickson, who ordered an echocardiogram and stress test. She reports that she has had intermittent chest tightness since. Patient states she was at a party this morning and felt like she had to lie down; she reports she had chest tightness at that time. She states she took an aspirin 325 mg about 12 hours, at 1 PM, although she normally takes a daily aspirin 81 mg. Patient states she has been able to play tennis without pain recently.   Past Medical History  Diagnosis Date  . Interstitial cystitis   . Hx: UTI (urinary tract infection)   . Weight loss   . Wound disruption, post-op, skin   . Pneumonia 05-09-11    2011 last time  . Paraesophageal hernia   . GERD (gastroesophageal reflux disease)   . H/O hiatal hernia   . Kidney stone   . 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 Esophageal stricture 11/04/2014   Past Surgical History  Procedure Laterality Date  . Vaginal hysterectomy    . Septoplasty    . Back surgery  05-09-11    x 2 lumbar  . Cholecystectomy  05-09-11    laparoscopic  . Tonsillectomy   05-09-11    Tonsillectomy  . Kidney stone surgery  05-09-11    past hx. and cystoscopy for bladder issues  . Laparoscopic nissen fundoplication  0000000    Procedure: LAPAROSCOPIC NISSEN FUNDOPLICATION;  Surgeon: Pedro Earls, MD;  Location: [facility];  Service: General;  Laterality: N/A;  will also remove sebaceous cyst of chest wall  . Irrigation and debridement sebaceous cyst  05/16/2011    Procedure: IRRIGATION AND DEBRIDEMENT SEBACEOUS CYST;  Surgeon: Pedro Earls, MD;  Location: [facility];  Service: General;  Laterality: N/A;  middle of chest on sternum  . Umbilical hernia repair  05/16/2011    Procedure: HERNIA REPAIR UMBILICAL ADULT;  Surgeon: Pedro Earls, MD;  Location: [facility];  Service: General;  Laterality: N/A;  with mesh   . Hernia repair  05/16/2011    hiatal hernia  . Colonoscopy  2002, 2008     rectal hyperplastic polyp 2002, diverticulosis 2008  . Laparoscopic nissen fundoplication  123456    Procedure: LAPAROSCOPIC NISSEN FUNDOPLICATION;  Surgeon: Pedro Earls, MD;  Location: [facility];  Service: General;  Laterality: N/A;  Re-do Laparoscopic Nissen   . Hiatal hernia repair  02/09/2012    Procedure: LAPAROSCOPIC REPAIR OF HIATAL HERNIA;  Surgeon: Pedro Earls, MD;  Location: [facility];  Service: General;  Laterality: N/A;  . Insertion of mesh  02/09/2012    Procedure:  INSERTION OF MESH;  Surgeon: Pedro Earls, MD;  Location: [facility];  Service: General;;  . Esophagogastroduodenoscopy     Family History  Problem Relation Age of Onset  . Colon cancer Neg Hx   . Ulcers Paternal Grandmother   . Ulcers Father   . Alcohol abuse Father   . Alzheimer's disease Mother    Social History  Substance Use Topics  . Smoking status: Never Smoker   . Smokeless tobacco: Never Used  . Alcohol Use: 4.2 oz/week    7 Glasses of wine per week   OB History    No data available     Review of Systems  Respiratory: Positive for shortness of breath.   Cardiovascular:  Positive for chest pain.  Gastrointestinal: Positive for nausea.  All other systems reviewed and are negative.   Allergies  Sulfa antibiotics  Home Medications   Prior to Admission medications   Medication Sig Start Date End Date Taking? Authorizing Provider  aspirin EC 81 MG tablet Take 81 mg by mouth daily.    Historical Provider, MD  Cholecalciferol (VITAMIN D3) 5000 UNITS CAPS Take 5,000 Units by mouth daily.    Historical Provider, MD  CRANBERRY PO Take 2 capsules by mouth daily.    Historical Provider, MD  methylphenidate (RITALIN) 10 MG tablet Take 10 mg by mouth 2 (two) times daily as needed (when pt is traveling).     Historical Provider, MD  ondansetron (ZOFRAN-ODT) 4 MG disintegrating tablet Take 1 tablet (4 mg total) by mouth every 8 (eight) hours as needed for nausea or vomiting. 11/04/14   Gatha Mayer, MD   BP 159/99 mmHg  Pulse 64  Temp(Src) 97.9 F (36.6 C) (Oral)  Resp 18  SpO2 98% Physical Exam  Constitutional: She is oriented to person, place, and time. She appears well-developed and well-nourished. No distress.  HENT:  Head: Normocephalic and atraumatic.  Right Ear: Hearing normal.  Left Ear: Hearing normal.  Nose: Nose normal.  Mouth/Throat: Oropharynx is clear and moist and mucous membranes are normal.  Eyes: Conjunctivae and EOM are normal. Pupils are equal, round, and reactive to light.  Neck: Normal range of motion. Neck supple.  Cardiovascular: Regular rhythm, S1 normal and S2 normal.  Exam reveals no gallop and no friction rub.   No murmur heard. Pulmonary/Chest: Effort normal and breath sounds normal. No respiratory distress. She exhibits no tenderness.  Abdominal: Soft. Normal appearance and bowel sounds are normal. There is no hepatosplenomegaly. There is no tenderness. There is no rebound, no guarding, no tenderness at McBurney's point and negative Murphy's sign. No hernia.  Musculoskeletal: Normal range of motion.  Neurological: She is alert  and oriented to person, place, and time. She has normal strength. No cranial nerve deficit or sensory deficit. Coordination normal. GCS eye subscore is 4. GCS verbal subscore is 5. GCS motor subscore is 6.  Skin: Skin is warm, dry and intact. No rash noted. No cyanosis.  Psychiatric: She has a normal mood and affect. Her speech is normal and behavior is normal. Thought content normal.  Nursing note and vitals reviewed.   ED Course  Procedures (including critical care time)  DIAGNOSTIC STUDIES: Oxygen Saturation is 98% on RA, normal by my interpretation.    COORDINATION OF CARE: 1:41 AM - Discussed treatment plan with pt at bedside which includes probable overnight stay at hospital. Pt verbalized understanding and agreed to plan.   Labs Review Labs Reviewed  BASIC METABOLIC PANEL - Abnormal; Notable  for the following:    Potassium 3.4 (*)    Glucose, Bld 113 (*)    All other components within normal limits  CBC - Abnormal; Notable for the following:    Platelets 136 (*)    All other components within normal limits  I-STAT TROPOININ, ED    Imaging Review Dg Chest 2 View  02/21/2015  CLINICAL DATA:  Central chest pain which awoke her from sleep. Dyspnea. EXAM: CHEST  2 VIEW COMPARISON:  09/04/2014 FINDINGS: There is moderate hyperinflation. The lungs are clear. There is no pleural effusion. Hilar, mediastinal and cardiac contours are unremarkable and unchanged. Pulmonary vasculature is normal. IMPRESSION: Hyperinflation. Electronically Signed   By: Andreas Newport M.D.   On: 02/21/2015 02:14   I have personally reviewed and evaluated these images and lab results as part of my medical decision-making.   EKG Interpretation   Date/Time:  Sunday February 21 2015 01:15:10 EST Ventricular Rate:  64 PR Interval:  175 QRS Duration: 92 QT Interval:  418 QTC Calculation: 431 R Axis:   13 Text Interpretation:  Sinus rhythm Low voltage, precordial leads  Borderline T abnormalities,  diffuse leads No significant change since last  tracing Confirmed by Jim Lundin  MD, Rodman Recupero [phone]) on 02/21/2015 1:57:44  AM      MDM   Final diagnoses:  None   chest pain  Presents to the ER for evaluation of chest pain. Patient reports that she developed a tightness in the center of her chest that awakened her from sleep. She did have some mild nausea and shortness of breath initially, but this has resolved. Patient did take a full-strength aspirin prior to arrival in the ER.  The patient's records reveals that she did have an event last month where she went into SVT after receiving a lidocaine with epinephrine injection at the dentist. She did have positive troponins during that hospitalization. Subsequent evaluation by cardiology felt that she had troponin leak secondary to the SVT, not likely to be secondary to blockages. Since she is now experiencing additional chest pain without SVT, however, cardiac etiology should be considered. Patient will be observed on the hospitalist service for serial enzymes, possible cardiology consultation in the morning.  I personally performed the services described in this documentation, which was scribed in my presence. The recorded information has been reviewed and is accurate.    Orpah Greek, MD 02/21/15 [phone]

## 2015-02-21 NOTE — Evaluation (Signed)
Physical Therapy Evaluation Patient Details Name: Stephanie Bell MRN: [id number] DOB: [birth year] Today's Date: 02/21/2015   History of Present Illness  Stephanie Bell is a 74 y.o. female with PMH of hiatal hernia, esophageal stricture, esophageal Zenker diverticula, s/p of LAPAROSCOPIC NISSEN FUNDOPLICATION, SVT, interstitial cystitis, who presents with chest tightness.  Clinical Impression  Patient evaluated by Physical Therapy with no further acute PT needs identified. All education has been completed and the patient has no further questions.  See below for any follow-up Physical Therapy or equipment needs. PT is signing off. Thank you for this referral.     Follow Up Recommendations No PT follow up    Equipment Recommendations  None recommended by PT    Recommendations for Other Services       Precautions / Restrictions Precautions Precautions: None      Mobility  Bed Mobility Overal bed mobility: Independent                Transfers Overall transfer level: Independent                  Ambulation/Gait Ambulation/Gait assistance: Modified independent (Device/Increase time) Ambulation Distance (Feet): 200 Feet Assistive device: None Gait Pattern/deviations: WFL(Within Functional Limits)     General Gait Details: pt guarded initially d/t lack of sleep and not having  been up and amb, no physical assist  Stairs            Wheelchair Mobility    Modified Rankin (Stroke Patients Only)       Balance Overall balance assessment: No apparent balance deficits (not formally assessed)                                           Pertinent Vitals/Pain Pain Assessment: No/denies pain    Home Living Family/patient expects to be discharged to:: Private residence Living Arrangements: Spouse/significant other   Type of Home: House Home Access: Stairs to enter   [employer] of Steps: 5 Home Layout: Two level;Able to  live on main level with bedroom/bathroom Home Equipment: Gilford Rile - 2 wheels;Cane - single point;Crutches Additional Comments: pt is very active, plays tennis twice a week and does water aerobics    Prior Function Level of Independence: Independent               Hand Dominance        Extremity/Trunk Assessment   Upper Extremity Assessment: Overall WFL for tasks assessed           Lower Extremity Assessment: Overall WFL for tasks assessed         Communication   Communication: No difficulties  Cognition Arousal/Alertness: Awake/alert Behavior During Therapy: WFL for tasks assessed/performed Overall Cognitive Status: Within Functional Limits for tasks assessed                      General Comments      Exercises        Assessment/Plan    PT Assessment Patent does not need any further PT services  PT Diagnosis Difficulty walking   PT Problem List    PT Treatment Interventions     PT Goals (Current goals can be found in the Care Plan section) Acute Rehab PT Goals Patient Stated Goal: to get back home and enjoy the holidays PT Goal Formulation: All assessment and education complete, DC therapy  Frequency     Barriers to discharge        Co-evaluation               End of Session   Activity Tolerance: Patient tolerated treatment well Patient left: in bed;with call bell/phone within reach;with family/visitor present Nurse Communication: Mobility status         Time: 1047-1106 PT Time Calculation (min) (ACUTE ONLY): 19 min   Charges:   PT Evaluation $Initial PT Evaluation Tier I: 1 Procedure     PT G Codes:        Abayomi Pattison 03-14-15, 11:26 AM

## 2015-02-21 NOTE — Progress Notes (Signed)
   [facility]                                                                              Patient Demographics  Stephanie Bell, is a 74 y.o. female, DOB - [birth year], [id number]  Admit date - 02/21/2015   Admitting Physician Ivor Costa, MD  Outpatient Primary MD for the patient is Maryland Pink, MD  LOS -    Chief Complaint  Patient presents with  . Chest Pain      HPI on 02/21/2015 by Dr. Ivor Costa Stephanie Bell is a 74 y.o. female with PMH of hiatal hernia, esophageal stricture, esophageal Zenker diverticula, s/p of LAPAROSCOPIC NISSEN FUNDOPLICATION, SVT, interstitial cystitis, who presents with chest tightness. Patient reports that she was waken up by chest tightness about 11:30 last night. It was associated with mild shortness of breath and nausea. No fever, chills, cough. It lasted for about 3 hours, and then resolved spontaneously. Currently no chest tightness, shortness of breath, chest pain. Patient denies abdominal pain, diarrhea, symptoms of UTI, unilateral weakness. In ED, patient was found to have negative troponin, WBC 6.2, temperature normal, no tachycardia, potassium is 3.4, renal function okay. Chest x-ray showed hyperinflation, but no infiltration. Patient is admitted to inpatient for further evaluation treatment.  Assessment & Plan   Patient admitted earlier this morning by Dr. Ivor Costa.  Please see full H&P.  Agree with current assessment and plan.  Chest tightness -Patient has been experiencing tachycardia with this chest tightness since October 2016 -Troponin negative -EKG shows no ischemic changes -Continue to cycle troponins -Echocardiogram on 01/29/2015 showed EF 123456, grade 2 diastolic dysfunction -[id number] Nuc med study: EF 70%, patient has mild ST abnormalities at baseline, study is normal low risk -Lipid panel: TC 204, TG 117, HDL 50, LDL 131 -Troponins will rule in, will consult cardiology  GERD  -Continue PPI  History of  Esophageal stricture  -Patient also Dr. Carlean Purl and was last seen on 11/04/2014  Code Status: Full  Family Communication: Husband at bedside.  Disposition Plan: Admitted for observation  Time Spent in minutes   30 minutes  Procedures  None  Consults   None  DVT Prophylaxis  Heparin   Ladiamond Gallina D.O. on 02/21/2015 at 11:14 AM  Between 7am to 7pm - Pager - [phone]  After 7pm go to [email or web address] - password [email or web address]  And look for the night coverage person covering for me after hours  [facility] Office  [phone]

## 2015-02-21 NOTE — ED Notes (Signed)
Pt from home c/o central chest that woke her from her sleep. She reports shortness of breath and nausea.

## 2015-02-21 NOTE — Progress Notes (Signed)
PHARMACIST - PHYSICIAN ORDER COMMUNICATION  CONCERNING: P&T Medication Policy on Herbal Medications  DESCRIPTION:  This patient's order for:  Cranberry  has been noted.  This product(s) is classified as an "herbal" or natural product. Due to a lack of definitive safety studies or FDA approval, nonstandard manufacturing practices, plus the potential risk of unknown drug-drug interactions while on inpatient medications, the Pharmacy and Therapeutics Committee does not permit the use of "herbal" or natural products of this type within [facility].   ACTION TAKEN: The pharmacy department is unable to verify this order at this time and your patient has been informed of this safety policy. Please reevaluate patient's clinical condition at discharge and address if the herbal or natural product(s) should be resumed at that time.  Thanks Dorrene German 02/21/2015 6:24 AM

## 2015-02-22 ENCOUNTER — Other Ambulatory Visit: Payer: Self-pay

## 2015-02-22 DIAGNOSIS — K222 Esophageal obstruction: Secondary | ICD-10-CM | POA: Diagnosis not present

## 2015-02-22 DIAGNOSIS — R0789 Other chest pain: Secondary | ICD-10-CM | POA: Diagnosis not present

## 2015-02-22 DIAGNOSIS — E785 Hyperlipidemia, unspecified: Secondary | ICD-10-CM

## 2015-02-22 DIAGNOSIS — K219 Gastro-esophageal reflux disease without esophagitis: Secondary | ICD-10-CM | POA: Diagnosis not present

## 2015-02-22 DIAGNOSIS — R0602 Shortness of breath: Secondary | ICD-10-CM | POA: Diagnosis not present

## 2015-02-22 LAB — CBC
HEMATOCRIT: 36.8 % (ref 36.0–46.0)
Hemoglobin: 12.3 g/dL (ref 12.0–15.0)
MCH: 28.7 pg (ref 26.0–34.0)
MCHC: 33.4 g/dL (ref 30.0–36.0)
MCV: 85.8 fL (ref 78.0–100.0)
PLATELETS: 124 10*3/uL — AB (ref 150–400)
RBC: 4.29 MIL/uL (ref 3.87–5.11)
RDW: 13.3 % (ref 11.5–15.5)
WBC: 4.8 10*3/uL (ref 4.0–10.5)

## 2015-02-22 LAB — BASIC METABOLIC PANEL
Anion gap: 4 — ABNORMAL LOW (ref 5–15)
BUN: 8 mg/dL (ref 6–20)
CO2: 29 mmol/L (ref 22–32)
Calcium: 8.9 mg/dL (ref 8.9–10.3)
Chloride: 107 mmol/L (ref 101–111)
Creatinine, Ser: 0.8 mg/dL (ref 0.44–1.00)
GFR calc Af Amer: >60 mL/min (ref >60)
GLUCOSE: 98 mg/dL (ref 65–99)
POTASSIUM: 4.1 mmol/L (ref 3.5–5.1)
Sodium: 140 mmol/L (ref 135–145)

## 2015-02-22 LAB — HEMOGLOBIN A1C
HEMOGLOBIN A1C: 5.2 % (ref 4.8–5.6)
Mean Plasma Glucose: 103 mg/dL

## 2015-02-22 NOTE — Progress Notes (Addendum)
OT Cancellation Note  Patient Details Name: Stephanie Bell MRN: [id number] DOB: 07/09/1940   Cancelled Treatment:     Reason pt not seen for OT: Orders received and chart reviewed & screened. Per review, pt is Independent and Mod I for all functional mobility and transfers w/ no needs at this time per PT and plans to d/c home w/ family/spouse PRN assist when medically able. Will sign off acute OT at this time. Thank you for this referral.  Almyra Deforest, OTR/L 02/22/2015, 8:23 AM

## 2015-02-22 NOTE — Discharge Summary (Signed)
Physician Discharge Summary  Stephanie Bell [id number] DOB: October 15, 1940 DOA: 02/21/2015  PCP: Maryland Pink, MD  Admit date: 02/21/2015 Discharge date: 02/22/2015  Time spent: 45 minutes  Recommendations for Outpatient Follow-up:  Patient will be discharged to home.  Patient will need to follow up with primary care provider within one week of discharge.   Follow up with Dr. Acie Fredrickson , cardiologist, discuss cholesterol and hypertension.  Patient should continue medications as prescribed.  Patient should follow a heart healty diet. Resume activity as tolerated.  Discharge Diagnoses:  Chest tightness Hyperlipidemia GERD History of Esophageal stricture  Discharge Condition: Stable  Diet recommendation: Heart healthy  Filed Weights   02/21/15 0640  Weight: 83.054 kg (183 lb 1.6 oz)    History of present illness:  on 02/21/2015 by Dr. Ivor Costa Stephanie Bell is a 74 y.o. female with PMH of hiatal hernia, esophageal stricture, esophageal Zenker diverticula, s/p of LAPAROSCOPIC NISSEN FUNDOPLICATION, SVT, interstitial cystitis, who presents with chest tightness. Patient reports that she was waken up by chest tightness about 11:30 last night. It was associated with mild shortness of breath and nausea. No fever, chills, cough. It lasted for about 3 hours, and then resolved spontaneously. Currently no chest tightness, shortness of breath, chest pain. Patient denies abdominal pain, diarrhea, symptoms of UTI, unilateral weakness. In ED, patient was found to have negative troponin, WBC 6.2, temperature normal, no tachycardia, potassium is 3.4, renal function okay. Chest x-ray showed hyperinflation, but no infiltration. Patient is admitted to inpatient for further evaluation treatment.  Hospital Course:  Chest tightness -Patient has been experiencing tachycardia with this chest tightness since October 2016 since having a dental procedure -EKG shows no ischemic changes -Cycled troponins and  remained negative -Echocardiogram on 01/29/2015 showed EF 123456, grade 2 diastolic dysfunction -[id number] Nuc med study: EF 70%, patient has mild ST abnormalities at baseline, study is normal, low risk -Lipid panel: TC 204, TG 117, HDL 50, LDL 131  Hyperlipidemia -started on lipitor 10mg  QHS -Patient does not wish to take this medication until speaking to her cardiologist.  GERD  -Continue PPI  History of Esophageal stricture  -Patient also Dr. Carlean Purl and was last seen on 11/04/2014  Procedures  None  Consults  None  Discharge Exam: Filed Vitals:   02/21/15 2038 02/22/15 0623  BP: 152/89 139/75  Pulse: 63 76  Temp: 98.2 F (36.8 C) 97.9 F (36.6 C)  Resp: 18 18     General: Well developed, well nourished, NAD, appears stated age  HEENT: NCAT, mucous membranes moist.  Cardiovascular: S1 S2 auscultated, no rubs, murmurs or gallops. Regular rate and rhythm.  Respiratory: Clear to auscultation bilaterally with equal chest rise  Abdomen: Soft, nontender, nondistended, + bowel sounds  Extremities: warm dry without cyanosis clubbing or edema  Neuro: AAOx3, nonfocal  Psych: Normal affect and demeanor  Discharge Instructions      Discharge Instructions    Discharge instructions    Complete by:  As directed   Patient will be discharged to home.  Patient will need to follow up with primary care provider within one week of discharge.  Follow up with Dr. Acie Fredrickson , cardiologist, discuss cholesterol and hypertension. Patient should continue medications as prescribed.  Patient should follow a heart healty diet. Resume activity as tolerated.            Medication List    TAKE these medications        aspirin EC 81 MG tablet  Take 81 mg  by mouth daily.     CRANBERRY PO  Take 2 capsules by mouth daily.     ibuprofen 200 MG tablet  Commonly known as:  ADVIL,MOTRIN  Take 400 mg by mouth every 6 (six) hours as needed for moderate pain.     multivitamin with  minerals Tabs tablet  Take 1 tablet by mouth daily.     ondansetron 4 MG disintegrating tablet  Commonly known as:  ZOFRAN-ODT  Take 1 tablet (4 mg total) by mouth every 8 (eight) hours as needed for nausea or vomiting.     Vitamin D3 5000 UNITS Caps  Take 5,000 Units by mouth daily.       Allergies  Allergen Reactions  . Epinephrine Hcl Palpitations and Other (See Comments)    tachycardia   . Sulfa Antibiotics Swelling and Rash   Follow-up Information    Follow up with Maryland Pink, MD. Schedule an appointment as soon as possible for a visit in 1 week.   Specialty:  Family Medicine   Why:  Hospital follow up   Contact information:   [address] [place] [address] [phone]       Follow up with Nahser, Wonda Cheng, MD. Schedule an appointment as soon as possible for a visit in 1 week.   Specialty:  Cardiology   Why:  Hypertension, cholesterol   Contact information:   [place] [address] [phone]        The results of significant diagnostics from this hospitalization (including imaging, microbiology, ancillary and laboratory) are listed below for reference.    Significant Diagnostic Studies: Dg Chest 2 View  02/21/2015  CLINICAL DATA:  Central chest pain which awoke her from sleep. Dyspnea. EXAM: CHEST  2 VIEW COMPARISON:  09/04/2014 FINDINGS: There is moderate hyperinflation. The lungs are clear. There is no pleural effusion. Hilar, mediastinal and cardiac contours are unremarkable and unchanged. Pulmonary vasculature is normal. IMPRESSION: Hyperinflation. Electronically Signed   By: Andreas Newport M.D.   On: 02/21/2015 02:14    Microbiology: No results found for this or any previous visit (from the past 240 hour(s)).   Labs: Basic Metabolic Panel:  Recent Labs Lab 02/21/15 0138 02/21/15 0531 02/22/15 0424  NA 140 138 140  K 3.4* 3.3* 4.1  CL 108 108 107  CO2 24 26 29   GLUCOSE 113* 107* 98    BUN 8 8 8   CREATININE 0.72 0.65 0.80  CALCIUM 9.2 8.7* 8.9   Liver Function Tests:  Recent Labs Lab 02/21/15 0531  AST 20  ALT 14  ALKPHOS 57  BILITOT 0.9  PROT 5.9*  ALBUMIN 3.5   No results for input(s): LIPASE, AMYLASE in the last 168 hours. No results for input(s): AMMONIA in the last 168 hours. CBC:  Recent Labs Lab 02/21/15 0138 02/21/15 0531 02/22/15 0424  WBC 6.2 5.3 4.8  HGB 13.2 12.3 12.3  HCT 37.9 35.7* 36.8  MCV 82.6 84.2 85.8  PLT 136* 128* 124*   Cardiac Enzymes:  Recent Labs Lab 02/21/15 0531 02/21/15 1209 02/21/15 1815  TROPONINI <0.03 <0.03 <0.03   BNP: BNP (last 3 results)  Recent Labs  02/21/15 0531  BNP 92.8    ProBNP (last 3 results) No results for input(s): PROBNP in the last 8760 hours.  CBG:  Recent Labs Lab 02/21/15 0814  GLUCAP 104*       Signed:  Cristal Ford  [facility] 02/22/2015, 8:32 AM

## 2015-02-22 NOTE — Discharge Instructions (Signed)
Nonspecific Chest Pain  °Chest pain can be caused by many different conditions. There is always a chance that your pain could be related to something serious, such as a heart attack or a blood clot in your lungs. Chest pain can also be caused by conditions that are not life-threatening. If you have chest pain, it is very important to follow up with your health care provider. °CAUSES  °Chest pain can be caused by: °· Heartburn. °· Pneumonia or bronchitis. °· Anxiety or stress. °· Inflammation around your heart (pericarditis) or lung (pleuritis or pleurisy). °· A blood clot in your lung. °· A collapsed lung (pneumothorax). It can develop suddenly on its own (spontaneous pneumothorax) or from trauma to the chest. °· Shingles infection (varicella-zoster virus). °· Heart attack. °· Damage to the bones, muscles, and cartilage that make up your chest wall. This can include: °¨ Bruised bones due to injury. °¨ Strained muscles or cartilage due to frequent or repeated coughing or overwork. °¨ Fracture to one or more ribs. °¨ Sore cartilage due to inflammation (costochondritis). °RISK FACTORS  °Risk factors for chest pain may include: °· Activities that increase your risk for trauma or injury to your chest. °· Respiratory infections or conditions that cause frequent coughing. °· Medical conditions or overeating that can cause heartburn. °· Heart disease or family history of heart disease. °· Conditions or health behaviors that increase your risk of developing a blood clot. °· Having had chicken pox (varicella zoster). °SIGNS AND SYMPTOMS °Chest pain can feel like: °· Burning or tingling on the surface of your chest or deep in your chest. °· Crushing, pressure, aching, or squeezing pain. °· Dull or sharp pain that is worse when you move, cough, or take a deep breath. °· Pain that is also felt in your back, neck, shoulder, or arm, or pain that spreads to any of these areas. °Your chest pain may come and go, or it may stay  constant. °DIAGNOSIS °Lab tests or other studies may be needed to find the cause of your pain. Your health care provider may have you take a test called an ambulatory ECG (electrocardiogram). An ECG records your heartbeat patterns at the time the test is performed. You may also have other tests, such as: °· Transthoracic echocardiogram (TTE). During echocardiography, sound waves are used to create a picture of all of the heart structures and to look at how blood flows through your heart. °· Transesophageal echocardiogram (TEE). This is a more advanced imaging test that obtains images from inside your body. It allows your health care provider to see your heart in finer detail. °· Cardiac monitoring. This allows your health care provider to monitor your heart rate and rhythm in real time. °· Holter monitor. This is a portable device that records your heartbeat and can help to diagnose abnormal heartbeats. It allows your health care provider to track your heart activity for several days, if needed. °· Stress tests. These can be done through exercise or by taking medicine that makes your heart beat more quickly. °· Blood tests. °· Imaging tests. °TREATMENT  °Your treatment depends on what is causing your chest pain. Treatment may include: °· Medicines. These may include: °¨ Acid blockers for heartburn. °¨ Anti-inflammatory medicine. °¨ Pain medicine for inflammatory conditions. °¨ Antibiotic medicine, if an infection is present. °¨ Medicines to dissolve blood clots. °¨ Medicines to treat coronary artery disease. °· Supportive care for conditions that do not require medicines. This may include: °¨ Resting. °¨ Applying heat   or cold packs to injured areas. °¨ Limiting activities until pain decreases. °HOME CARE INSTRUCTIONS °· If you were prescribed an antibiotic medicine, finish it all even if you start to feel better. °· Avoid any activities that bring on chest pain. °· Do not use any tobacco products, including  cigarettes, chewing tobacco, or electronic cigarettes. If you need help quitting, ask your health care provider. °· Do not drink alcohol. °· Take medicines only as directed by your health care provider. °· Keep all follow-up visits as directed by your health care provider. This is important. This includes any further testing if your chest pain does not go away. °· If heartburn is the cause for your chest pain, you may be told to keep your head raised (elevated) while sleeping. This reduces the chance that acid will go from your stomach into your esophagus. °· Make lifestyle changes as directed by your health care provider. These may include: °¨ Getting regular exercise. Ask your health care provider to suggest some activities that are safe for you. °¨ Eating a heart-healthy diet. A registered dietitian can help you to learn healthy eating options. °¨ Maintaining a healthy weight. °¨ Managing diabetes, if necessary. °¨ Reducing stress. °SEEK MEDICAL CARE IF: °· Your chest pain does not go away after treatment. °· You have a rash with blisters on your chest. °· You have a fever. °SEEK IMMEDIATE MEDICAL CARE IF:  °· Your chest pain is worse. °· You have an increasing cough, or you cough up blood. °· You have severe abdominal pain. °· You have severe weakness. °· You faint. °· You have chills. °· You have sudden, unexplained chest discomfort. °· You have sudden, unexplained discomfort in your arms, back, neck, or jaw. °· You have shortness of breath at any time. °· You suddenly start to sweat, or your skin gets clammy. °· You feel nauseous or you vomit. °· You suddenly feel light-headed or dizzy. °· Your heart begins to beat quickly, or it feels like it is skipping beats. °These symptoms may represent a serious problem that is an emergency. Do not wait to see if the symptoms will go away. Get medical help right away. Call your local emergency services (911 in the [place] Do not drive yourself to the hospital. °  °This  information is not intended to replace advice given to you by your health care provider. Make sure you discuss any questions you have with your health care provider. °  °Document Released: 12/14/2004 Document Revised: 03/27/2014 Document Reviewed: 10/10/2013 °Elsevier Interactive Patient Education ©2016 [employer] ° °

## 2015-03-09 NOTE — Progress Notes (Signed)
Cardiology Office Note   Date:  03/11/2015   ID:  Shenetta, Mook [birth year], MRN [id number]  PCP:  [place] Pink, MD  Cardiologist:  Dr. Acie Fredrickson   Chest pain.    History of Present Illness: Stephanie Bell is a 74 y.o. female  with a known history of GERD with hiatal hernia repair and esophageal stricture and recent elevated troponin in the setting of acute reaction to dental procedure who presents for post hospital follow up.   On 12/21/14 was seen as the dentist. She had an acute reaction to lidocaine with epinephrine ( she has a previous allergy to epinephrine from 20 years ago). She developed tachycardia as well as numbness, arm pain and presyncope. She was brought to the hospital via EMS. Troponin 0.08--> 0.23. She had no chest pain or SOB and reported playing completive tennis with no symptoms.   She was seen by Dr Acie Fredrickson 01/22/15 for follow up. He felt that episode was likely due to coronary vasospasm, but set up 2D ECHO and nuclear stress test. He felt like she could be seen on a PRN basis. Echocardiogram on 01/29/2015 showed EF 123456, grade 2 diastolic dysfunction, mild MR no RWMAs. 03/30/2014 Nuc med study: EF 70%, patient has mild ST abnormalities at baseline, study is normal, low risk.  She was recently admitted to [facility] 02/21/15 for chest pain. She ruled out and was discharged the following day on 02/22/15.   Today she presents to clinic for follow up. She has been having some chest pain periodically. She has some this AM. It's like someone has "got a hold of her heart and stopping it from beating." Sometimes in the early AM she has some sharp stabbing pain under her right breast. She has been keeping a BP long with some elevated numbers (SBP: 174, 168 ). She has continued to play competitive tennis with no exertional sx. She does admit to quite a bit of anxiety surrounding her allergic reaction at the dentist.    Past Medical History  Diagnosis Date  . Interstitial  cystitis   . Hx: UTI (urinary tract infection)   . Weight loss   . Wound disruption, post-op, skin   . Pneumonia 05-09-11    2011 last time  . Paraesophageal hernia   . GERD (gastroesophageal reflux disease)   . H/O hiatal hernia   . Kidney stone   . 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 Esophageal stricture 11/04/2014    Past Surgical History  Procedure Laterality Date  . Vaginal hysterectomy    . Septoplasty    . Back surgery  05-09-11    x 2 lumbar  . Cholecystectomy  05-09-11    laparoscopic  . Tonsillectomy  05-09-11    Tonsillectomy  . Kidney stone surgery  05-09-11    past hx. and cystoscopy for bladder issues  . Laparoscopic nissen fundoplication  0000000    Procedure: LAPAROSCOPIC NISSEN FUNDOPLICATION;  Surgeon: Pedro Earls, MD;  Location: [facility];  Service: General;  Laterality: N/A;  will also remove sebaceous cyst of chest wall  . Irrigation and debridement sebaceous cyst  05/16/2011    Procedure: IRRIGATION AND DEBRIDEMENT SEBACEOUS CYST;  Surgeon: Pedro Earls, MD;  Location: [facility];  Service: General;  Laterality: N/A;  middle of chest on sternum  . Umbilical hernia repair  05/16/2011    Procedure: HERNIA REPAIR UMBILICAL ADULT;  Surgeon: Pedro Earls, MD;  Location: [facility];  Service: General;  Laterality: N/A;  with mesh   . Hernia repair  05/16/2011    hiatal hernia  . Colonoscopy  2002, 2008     rectal hyperplastic polyp 2002, diverticulosis 2008  . Laparoscopic nissen fundoplication  [id number]    Procedure: LAPAROSCOPIC NISSEN FUNDOPLICATION;  Surgeon: Pedro Earls, MD;  Location: [facility];  Service: General;  Laterality: N/A;  Re-do Laparoscopic Nissen   . Hiatal hernia repair  02/09/2012    Procedure: LAPAROSCOPIC REPAIR OF HIATAL HERNIA;  Surgeon: Pedro Earls, MD;  Location: [facility];  Service: General;  Laterality: N/A;  . Insertion of mesh  02/09/2012    Procedure: INSERTION OF MESH;  Surgeon: Pedro Earls, MD;  Location: [facility];  Service: General;;  . Esophagogastroduodenoscopy       Current Outpatient Prescriptions  Medication Sig Dispense Refill  . aspirin EC 81 MG tablet Take 81 mg by mouth daily.    . Cholecalciferol (VITAMIN D3) 5000 UNITS CAPS Take 5,000 Units by mouth daily.    Marland Kitchen CRANBERRY PO Take 2 capsules by mouth daily.    Marland Kitchen ibuprofen (ADVIL,MOTRIN) 200 MG tablet Take 400 mg by mouth every 6 (six) hours as needed for moderate pain.    . Multiple Vitamin (MULTIVITAMIN WITH MINERALS) TABS tablet Take 1 tablet by mouth daily.    . ondansetron (ZOFRAN-ODT) 4 MG disintegrating tablet Take 1 tablet (4 mg total) by mouth every 8 (eight) hours as needed for nausea or vomiting. 30 tablet 11   No current facility-administered medications for this visit.    Allergies:   Epinephrine hcl and Sulfa antibiotics    Social History:  The patient  reports that she has never smoked. She has never used smokeless tobacco. She reports that she drinks about 4.2 oz of alcohol per week. She reports that she does not use illicit drugs.   Family History:  The patient's family history includes Alcohol abuse in her father; Alzheimer's disease in her mother; Ulcers in her father and paternal grandmother. There is no history of Colon cancer.    ROS:  Please see the history of present illness.   Otherwise, review of systems are positive for NONE.   All other systems are reviewed and negative.    PHYSICAL EXAM: VS:  BP 118/80 mmHg  Pulse 77  Ht 5\' 5"  (1.651 m)  Wt 180 lb (81.647 kg)  BMI 29.95 kg/m2 , BMI Body mass index is 29.95 kg/(m^2). GEN: Well nourished, well developed, in no acute distress HEENT: normal Neck: no JVD, carotid bruits, or masses Cardiac: RRR; no murmurs, rubs, or gallops,no edema  Respiratory:  clear to auscultation bilaterally, normal work of breathing GI: soft, nontender, nondistended, + BS MS: no deformity or atrophy Skin: warm and dry, no rash Neuro:  Strength and sensation are intact Psych:  euthymic mood, full affect   EKG:  EKG is ordered today. The ekg ordered today demonstrates NSR HR 77; low voltage QRS.    Recent Labs: 02/21/2015: ALT 14; B Natriuretic Peptide 92.8 02/22/2015: BUN 8; Creatinine, Ser 0.80; Hemoglobin 12.3; Platelets 124*; Potassium 4.1; Sodium 140    Lipid Panel    Component Value Date/Time   CHOL 204* 02/21/2015 0531   TRIG 117 02/21/2015 0531   HDL 50 02/21/2015 0531   CHOLHDL 4.1 02/21/2015 0531   VLDL 23 02/21/2015 0531   LDLCALC 131* 02/21/2015 0531      Wt Readings from Last 3 Encounters:  03/11/15 180 lb (81.647 kg)  02/21/15 183 lb 1.6 oz (  83.054 kg)  01/22/15 179 lb 12.8 oz (81.557 kg)      Other studies Reviewed: Additional studies/ records that were reviewed today include: 2D ECHO, ett nuclear stress test Review of the above records demonstrates: see HPI   ASSESSMENT AND PLAN:  DEETTE DULWORTH is a 74 y.o. female  with a known history of GERD with hiatal hernia repair and esophageal stricture and recent elevated troponin in the setting of acute reaction to dental procedure who presents for post hospital follow up.   Chest pain- elevated troponin felt to be due to coronary vasospasm in the setting of tachycardia after a bad reaction to dental procedure w/ epinephrine ( known allergy). Recent reassuring ECHO and Ett nuclear stress test. Recently admitted for chest pain and ruled out. Provided reassurance. Her chest pain is atypical. We discussed only way to rule out CAD 100% is cath. She does not want this. She has very few RF for CAD.   HTN: home BP with some high numbers. Will start Lisinopril 5mg  and she will continue to monitor her BP.    Current medicines are reviewed at length with the patient today.  The patient does not have concerns regarding medicines.  The following changes have been made: start lisinopril 5mg  po qd.   Labs/ tests ordered today include:   Orders Placed This Encounter  Procedures  . EKG 12-Lead       Disposition:   FU with Dr. Acie Fredrickson in 3 months and then PRN after that if she is doing okay.   Renea Ee  03/11/2015 9:20 AM    [facility], [place], [place]  [address] Phone: [phone]; Fax: [phone]

## 2015-03-11 ENCOUNTER — Encounter: Payer: Self-pay | Admitting: Physician Assistant

## 2015-03-11 ENCOUNTER — Ambulatory Visit (INDEPENDENT_AMBULATORY_CARE_PROVIDER_SITE_OTHER): Payer: Medicare Other | Admitting: Physician Assistant

## 2015-03-11 VITALS — BP 118/80 | HR 77 | Ht 65.0 in | Wt 180.0 lb

## 2015-03-11 DIAGNOSIS — R0789 Other chest pain: Secondary | ICD-10-CM

## 2015-03-11 MED ORDER — LISINOPRIL 5 MG PO TABS: 5.0000 mg | ORAL_TABLET | Freq: Every day | ORAL | Status: DC

## 2015-03-11 NOTE — Patient Instructions (Signed)
Medication Instructions:  Your physician has recommended you make the following change in your medication:  1.  START Lisinopril 5 mg taking 1 tablet daily    Labwork: None ordered  Testing/Procedures: None ordered  Follow-Up: Your physician recommends that you schedule a follow-up appointment in: 3 MONTHS  WITH DR. Acie Fredrickson   Any Other Special Instructions Will Be Listed Below (If Applicable).     If you need a refill on your cardiac medications before your next appointment, please call your pharmacy.

## 2015-03-25 DIAGNOSIS — J029 Acute pharyngitis, unspecified: Secondary | ICD-10-CM | POA: Diagnosis not present

## 2015-03-25 DIAGNOSIS — J189 Pneumonia, unspecified organism: Secondary | ICD-10-CM | POA: Diagnosis not present

## 2015-03-25 DIAGNOSIS — R05 Cough: Secondary | ICD-10-CM | POA: Diagnosis not present

## 2015-03-25 DIAGNOSIS — J069 Acute upper respiratory infection, unspecified: Secondary | ICD-10-CM | POA: Diagnosis not present

## 2015-03-31 DIAGNOSIS — J189 Pneumonia, unspecified organism: Secondary | ICD-10-CM | POA: Diagnosis not present

## 2015-04-02 DIAGNOSIS — R0789 Other chest pain: Secondary | ICD-10-CM | POA: Diagnosis not present

## 2015-04-03 DIAGNOSIS — R0781 Pleurodynia: Secondary | ICD-10-CM | POA: Diagnosis not present

## 2015-04-05 DIAGNOSIS — J189 Pneumonia, unspecified organism: Secondary | ICD-10-CM | POA: Diagnosis not present

## 2015-04-21 DIAGNOSIS — Z8701 Personal history of pneumonia (recurrent): Secondary | ICD-10-CM | POA: Diagnosis not present

## 2015-04-21 DIAGNOSIS — R918 Other nonspecific abnormal finding of lung field: Secondary | ICD-10-CM | POA: Diagnosis not present

## 2015-05-14 ENCOUNTER — Emergency Department: Payer: PPO

## 2015-05-14 ENCOUNTER — Encounter: Payer: Self-pay | Admitting: Emergency Medicine

## 2015-05-14 ENCOUNTER — Inpatient Hospital Stay
Admission: EM | Admit: 2015-05-14 | Discharge: 2015-05-16 | DRG: 066 | Disposition: A | Discharge status: Home Health | Payer: PPO | Attending: Specialist | Admitting: Specialist

## 2015-05-14 DIAGNOSIS — Z7982 Long term (current) use of aspirin: Secondary | ICD-10-CM

## 2015-05-14 DIAGNOSIS — Z882 Allergy status to sulfonamides status: Secondary | ICD-10-CM | POA: Diagnosis not present

## 2015-05-14 DIAGNOSIS — I63332 Cerebral infarction due to thrombosis of left posterior cerebral artery: Secondary | ICD-10-CM | POA: Diagnosis not present

## 2015-05-14 DIAGNOSIS — Z87442 Personal history of urinary calculi: Secondary | ICD-10-CM | POA: Diagnosis not present

## 2015-05-14 DIAGNOSIS — Z820 Family history of epilepsy and other diseases of the nervous system: Secondary | ICD-10-CM | POA: Diagnosis not present

## 2015-05-14 DIAGNOSIS — I672 Cerebral atherosclerosis: Secondary | ICD-10-CM | POA: Diagnosis not present

## 2015-05-14 DIAGNOSIS — E785 Hyperlipidemia, unspecified: Secondary | ICD-10-CM | POA: Diagnosis not present

## 2015-05-14 DIAGNOSIS — I6381 Other cerebral infarction due to occlusion or stenosis of small artery: Secondary | ICD-10-CM | POA: Diagnosis present

## 2015-05-14 DIAGNOSIS — K219 Gastro-esophageal reflux disease without esophagitis: Secondary | ICD-10-CM | POA: Diagnosis not present

## 2015-05-14 DIAGNOSIS — G8311 Monoplegia of lower limb affecting right dominant side: Secondary | ICD-10-CM | POA: Diagnosis present

## 2015-05-14 DIAGNOSIS — I10 Essential (primary) hypertension: Secondary | ICD-10-CM | POA: Diagnosis present

## 2015-05-14 DIAGNOSIS — I708 Atherosclerosis of other arteries: Secondary | ICD-10-CM | POA: Diagnosis not present

## 2015-05-14 DIAGNOSIS — I6359 Cerebral infarction due to unspecified occlusion or stenosis of other cerebral artery: Secondary | ICD-10-CM | POA: Diagnosis not present

## 2015-05-14 DIAGNOSIS — I639 Cerebral infarction, unspecified: Principal | ICD-10-CM | POA: Diagnosis present

## 2015-05-14 DIAGNOSIS — R200 Anesthesia of skin: Secondary | ICD-10-CM | POA: Diagnosis not present

## 2015-05-14 DIAGNOSIS — I63513 Cerebral infarction due to unspecified occlusion or stenosis of bilateral middle cerebral arteries: Secondary | ICD-10-CM | POA: Diagnosis not present

## 2015-05-14 LAB — BASIC METABOLIC PANEL
ANION GAP: 7 (ref 5–15)
BUN: 10 mg/dL (ref 6–20)
CHLORIDE: 103 mmol/L (ref 101–111)
CO2: 25 mmol/L (ref 22–32)
Calcium: 8.6 mg/dL — ABNORMAL LOW (ref 8.9–10.3)
Creatinine, Ser: 0.92 mg/dL (ref 0.44–1.00)
GFR calc non Af Amer: 59 mL/min — ABNORMAL LOW (ref >60)
Glucose, Bld: 109 mg/dL — ABNORMAL HIGH (ref 65–99)
POTASSIUM: 3.7 mmol/L (ref 3.5–5.1)
Sodium: 135 mmol/L (ref 135–145)

## 2015-05-14 LAB — CBC WITH DIFFERENTIAL/PLATELET
BASOS ABS: 0 10*3/uL (ref 0–0.1)
Basophils Relative: 1 %
Eosinophils Absolute: 0.3 10*3/uL (ref 0–0.7)
Eosinophils Relative: 5 %
HEMATOCRIT: 39 % (ref 35.0–47.0)
Hemoglobin: 13.4 g/dL (ref 12.0–16.0)
LYMPHS PCT: 20 %
Lymphs Abs: 1.3 10*3/uL (ref 1.0–3.6)
MCH: 28.8 pg (ref 26.0–34.0)
MCHC: 34.5 g/dL (ref 32.0–36.0)
MCV: 83.5 fL (ref 80.0–100.0)
MONO ABS: 0.6 10*3/uL (ref 0.2–0.9)
Monocytes Relative: 9 %
NEUTROS ABS: 4.1 10*3/uL (ref 1.4–6.5)
Neutrophils Relative %: 65 %
Platelets: 137 10*3/uL — ABNORMAL LOW (ref 150–440)
RBC: 4.67 MIL/uL (ref 3.80–5.20)
RDW: 13.7 % (ref 11.5–14.5)
WBC: 6.4 10*3/uL (ref 3.6–11.0)

## 2015-05-14 LAB — TROPONIN I: Troponin I: <0.03 ng/mL (ref <0.031)

## 2015-05-14 MED ORDER — AMLODIPINE BESYLATE 5 MG PO TABS
5.0000 mg | ORAL_TABLET | Freq: Every day | ORAL | Status: DC
  Administered 2015-05-14 – 2015-05-16 (×3): 5 mg via ORAL
  Filled 2015-05-14 (×3): qty 1

## 2015-05-14 MED ORDER — VITAMIN D 1000 UNITS PO TABS
5000.0000 [IU] | ORAL_TABLET | Freq: Every day | ORAL | Status: DC
  Administered 2015-05-14 – 2015-05-16 (×3): 5000 [IU] via ORAL
  Filled 2015-05-14 (×3): qty 5

## 2015-05-14 MED ORDER — HYDROCHLOROTHIAZIDE 25 MG PO TABS
25.0000 mg | ORAL_TABLET | Freq: Every day | ORAL | Status: DC
  Administered 2015-05-14 – 2015-05-16 (×3): 25 mg via ORAL
  Filled 2015-05-14 (×3): qty 1

## 2015-05-14 MED ORDER — ASPIRIN 81 MG PO CHEW: 324.0000 mg | CHEWABLE_TABLET | Freq: Once | ORAL | Status: DC

## 2015-05-14 MED ORDER — CLOPIDOGREL BISULFATE 75 MG PO TABS
75.0000 mg | ORAL_TABLET | Freq: Every day | ORAL | Status: DC
Start: 2015-05-14 — End: 2015-05-16
  Administered 2015-05-14 – 2015-05-16 (×3): 75 mg via ORAL
  Filled 2015-05-14 (×3): qty 1

## 2015-05-14 MED ORDER — CRANBERRY-VITAMIN C 84-20 MG PO CAPS: 2.0000 | ORAL_CAPSULE | Freq: Every day | ORAL | Status: DC

## 2015-05-14 MED ORDER — ADULT MULTIVITAMIN W/MINERALS CH
1.0000 | ORAL_TABLET | Freq: Every day | ORAL | Status: DC
  Administered 2015-05-15 – 2015-05-16 (×2): 1 via ORAL
  Filled 2015-05-14 (×3): qty 1

## 2015-05-14 MED ORDER — ONDANSETRON HCL 4 MG/2ML IJ SOLN: 4.0000 mg | Freq: Four times a day (QID) | INTRAMUSCULAR | Status: DC | PRN

## 2015-05-14 MED ORDER — ACETAMINOPHEN 325 MG PO TABS: 650.0000 mg | ORAL_TABLET | Freq: Four times a day (QID) | ORAL | Status: DC | PRN

## 2015-05-14 MED ORDER — ONDANSETRON HCL 4 MG PO TABS: 4.0000 mg | ORAL_TABLET | Freq: Four times a day (QID) | ORAL | Status: DC | PRN

## 2015-05-14 MED ORDER — ACETAMINOPHEN 650 MG RE SUPP: 650.0000 mg | Freq: Four times a day (QID) | RECTAL | Status: DC | PRN

## 2015-05-14 NOTE — H&P (Signed)
[facility] NAME: Stephanie Bell    MR#:  [id number]  DATE OF BIRTH:  [birth year]  DATE OF ADMISSION:  05/14/2015  PRIMARY CARE PHYSICIAN: Maryland Pink, MD   REQUESTING/REFERRING PHYSICIAN: Dr. Nance Pear  CHIEF COMPLAINT:   Chief Complaint  Patient presents with  . Extremity Weakness    Right leg    HISTORY OF PRESENT ILLNESS:  Stephanie Bell  is a 75 y.o. female with a known history of hypertension, GERD, history of hiatal hernia status post repair, esophageal stricture, Zenker's diverticulum, paraesophageal hernia, who presents to the hospital today due to right foot and leg numbness. Patient says she woke up this morning around 9 AM and felt that her right foot was numb and not acting appropriately. She attempted to dorsiflex her foot while coming down the stairs but it did  not cooperate with her. She denied any headache, focal weakness, dizziness, nausea, vomiting or any other associated symptoms. She took some aspirin at home given to her by her husband and presented to the ER for further evaluation. 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 Hospitalist services were contacted further treatment and evaluation.  PAST MEDICAL HISTORY:   Past Medical History  Diagnosis Date  . Interstitial cystitis   . Hx: UTI (urinary tract infection)   . Weight loss   . Wound disruption, post-op, skin   . Pneumonia 05-09-11    2011 last time  . Paraesophageal hernia   . GERD (gastroesophageal reflux disease)   . H/O hiatal hernia   . Kidney stone   . 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 Esophageal stricture 11/04/2014    PAST SURGICAL HISTORY:   Past Surgical History  Procedure Laterality Date  . Vaginal hysterectomy    . Septoplasty    . Back surgery  05-09-11    x 2 lumbar  . Cholecystectomy  05-09-11    laparoscopic  . Tonsillectomy   05-09-11    Tonsillectomy  . Kidney stone surgery  05-09-11    past hx. and cystoscopy for bladder issues  . Laparoscopic nissen fundoplication  0000000    Procedure: LAPAROSCOPIC NISSEN FUNDOPLICATION;  Surgeon: Pedro Earls, MD;  Location: [facility];  Service: General;  Laterality: N/A;  will also remove sebaceous cyst of chest wall  . Irrigation and debridement sebaceous cyst  05/16/2011    Procedure: IRRIGATION AND DEBRIDEMENT SEBACEOUS CYST;  Surgeon: Pedro Earls, MD;  Location: [facility];  Service: General;  Laterality: N/A;  middle of chest on sternum  . Umbilical hernia repair  05/16/2011    Procedure: HERNIA REPAIR UMBILICAL ADULT;  Surgeon: Pedro Earls, MD;  Location: [facility];  Service: General;  Laterality: N/A;  with mesh   . Hernia repair  05/16/2011    hiatal hernia  . Colonoscopy  2002, 2008     rectal hyperplastic polyp 2002, diverticulosis 2008  . Laparoscopic nissen fundoplication  [id number]    Procedure: LAPAROSCOPIC NISSEN FUNDOPLICATION;  Surgeon: Pedro Earls, MD;  Location: [facility];  Service: General;  Laterality: N/A;  Re-do Laparoscopic Nissen   . Hiatal hernia repair  02/09/2012    Procedure: LAPAROSCOPIC REPAIR OF HIATAL HERNIA;  Surgeon: Pedro Earls, MD;  Location: [facility];  Service: General;  Laterality: N/A;  . Insertion of mesh  02/09/2012    Procedure: INSERTION OF MESH;  Surgeon: Pedro Earls, MD;  Location: [facility];  Service: General;;  . Esophagogastroduodenoscopy      SOCIAL HISTORY:   Social History  Substance Use Topics  . Smoking status: Never Smoker   . Smokeless tobacco: Never Used  . Alcohol Use: 0.6 oz/week    1 Glasses of wine per week     Comment: Daily 4oz    FAMILY HISTORY:   Family History  Problem Relation Age of Onset  . Colon cancer Neg Hx   . Ulcers Paternal Grandmother   . Ulcers Father   . Alcohol abuse Father   . Alzheimer's disease Mother     DRUG ALLERGIES:   Allergies  Allergen Reactions  .  Epinephrine Hcl Palpitations and Other (See Comments)    Reaction:  Tachycardia   . Sulfa Antibiotics Swelling and Rash    REVIEW OF SYSTEMS:   Review of Systems  Constitutional: Negative for fever and weight loss.  HENT: Negative for congestion, nosebleeds and tinnitus.   Eyes: Negative for blurred vision, double vision and redness.  Respiratory: Negative for cough, hemoptysis and shortness of breath.   Cardiovascular: Negative for chest pain, orthopnea, leg swelling and PND.  Gastrointestinal: Negative for nausea, vomiting, abdominal pain, diarrhea and melena.  Genitourinary: Negative for dysuria, urgency and hematuria.  Musculoskeletal: Negative for joint pain and falls.  Neurological: Positive for tingling and sensory change (right foot). Negative for dizziness, focal weakness, seizures, weakness and headaches.  Endo/Heme/Allergies: Negative for polydipsia. Does not bruise/bleed easily.  Psychiatric/Behavioral: Negative for depression and memory loss. The patient is not nervous/anxious.     MEDICATIONS AT HOME:   Prior to Admission medications   Medication Sig Start Date End Date Taking? Authorizing Provider  aspirin EC 81 MG tablet Take 81 mg by mouth daily.   Yes Historical Provider, MD  Cholecalciferol (VITAMIN D3) 5000 UNITS CAPS Take 5,000 Units by mouth daily.   Yes Historical Provider, MD  Cranberry-Vitamin C 84-20 MG CAPS Take 2 capsules by mouth daily.   Yes Historical Provider, MD  ibuprofen (ADVIL,MOTRIN) 200 MG tablet Take 200 mg by mouth every 6 (six) hours as needed for headache or mild pain.    Yes Historical Provider, MD  Multiple Vitamin (MULTIVITAMIN WITH MINERALS) TABS tablet Take 1 tablet by mouth daily.   Yes Historical Provider, MD  ondansetron (ZOFRAN-ODT) 4 MG disintegrating tablet Take 1 tablet (4 mg total) by mouth every 8 (eight) hours as needed for nausea or vomiting. 11/04/14  Yes Gatha Mayer, MD  lisinopril (PRINIVIL,ZESTRIL) 5 MG tablet Take 1  tablet (5 mg total) by mouth daily. Patient not taking: Reported on 05/14/2015 03/11/15   Eileen Stanford, PA-C      VITAL SIGNS:  Blood pressure 156/88, pulse 70, temperature 98.2 F (36.8 C), temperature source Oral, resp. rate 20, height 5\' 6"  (1.676 m), weight 81.647 kg (180 lb), SpO2 97 %.  PHYSICAL EXAMINATION:  Physical Exam  GENERAL:  75 y.o.-year-old patient lying in the bed in no acute distress.  EYES: Pupils equal, round, reactive to light and accommodation. No scleral icterus. Extraocular muscles intact.  HEENT: Head atraumatic, normocephalic. Oropharynx and nasopharynx clear. No oropharyngeal erythema, moist oral mucosa  NECK:  Supple, no jugular venous distention. No thyroid enlargement, no tenderness.  LUNGS: Normal breath sounds bilaterally, no wheezing, rales, rhonchi. No use of accessory muscles of respiration.  CARDIOVASCULAR: S1, S2 RRR. No murmurs, rubs, gallops, clicks.  ABDOMEN: Soft, nontender, nondistended. Bowel sounds present. No organomegaly or mass.  EXTREMITIES: No pedal  edema, cyanosis, or clubbing. + 2 pedal & radial pulses b/l.   NEUROLOGIC: Cranial nerves II through XII are intact. No focal Motor or sensory deficits appreciated b/l PSYCHIATRIC: The patient is alert and oriented x 3. Good affect.  SKIN: No obvious rash, lesion, or ulcer.   LABORATORY PANEL:   CBC  Recent Labs Lab 05/14/15 1604  WBC 6.4  HGB 13.4  HCT 39.0  PLT 137*   ------------------------------------------------------------------------------------------------------------------  Chemistries   Recent Labs Lab 05/14/15 1604  NA 135  K 3.7  CL 103  CO2 25  GLUCOSE 109*  BUN 10  CREATININE 0.92  CALCIUM 8.6*   ------------------------------------------------------------------------------------------------------------------  Cardiac Enzymes  Recent Labs Lab 05/14/15 1604  TROPONINI <0.03    ------------------------------------------------------------------------------------------------------------------  RADIOLOGY:  Mr Jodene Nam Head Wo Contrast  05/14/2015  CLINICAL DATA:  Right leg numbness upon awakening.  Weakness. EXAM: MRI HEAD WITHOUT CONTRAST MRA HEAD WITHOUT CONTRAST TECHNIQUE: Multiplanar, multiecho pulse sequences of the brain and surrounding structures were obtained without intravenous contrast. Angiographic images of the head were obtained using MRA technique without contrast. COMPARISON:  None. 10/10/2011 FINDINGS: MRI HEAD FINDINGS Calvarium and upper cervical spine: No focal marrow signal abnormality. Orbits: Negative. Sinuses and Mastoids: Mucosal thickening on the left, overall mild. No effusion. Brain: Ovoid 1 cm area of restricted diffusion in the lateral left thalamus, extending into the posterior limb internal capsule. No hemorrhagic conversion. No other territory of acute infarct. No evidence of major vessel occlusion. Elsewhere, microvascular ischemic change is mild/expected for age. No previous infarct. No chronic hemorrhagic foci. MRA HEAD FINDINGS Symmetric carotid and vertebral arteries. Intact circle Willis with small anterior communicating artery. Symmetric vertebrobasilar branching. Symmetric poor signal in distal vertebral and proximal basilar arteries is considered artifactual. These vessels have normal signal on T2 weighted conventional imaging. Atherosclerotic type narrowings: Distal left M1 segment high-grade tandem stenoses. Moderate right proximal M2 segment stenosis. Bilateral M3 narrowing, potentially overestimated due to artifact (poor signal in the bilateral A2 segments appears artifactual). Bilateral atherosclerotic irregularity of the proximal PCA. IMPRESSION: 1. 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 2. No acute arterial finding. 3. Intracranial atherosclerosis as described. No asymmetric disease in the proximal left PCA to correlate  with #1. Electronically Signed   By: Monte Fantasia M.D.   On: 05/14/2015 17:45   Mr Brain Wo Contrast  05/14/2015  CLINICAL DATA:  Right leg numbness upon awakening.  Weakness. EXAM: MRI HEAD WITHOUT CONTRAST MRA HEAD WITHOUT CONTRAST TECHNIQUE: Multiplanar, multiecho pulse sequences of the brain and surrounding structures were obtained without intravenous contrast. Angiographic images of the head were obtained using MRA technique without contrast. COMPARISON:  None. 10/10/2011 FINDINGS: MRI HEAD FINDINGS Calvarium and upper cervical spine: No focal marrow signal abnormality. Orbits: Negative. Sinuses and Mastoids: Mucosal thickening on the left, overall mild. No effusion. Brain: Ovoid 1 cm area of restricted diffusion in the lateral left thalamus, extending into the posterior limb internal capsule. No hemorrhagic conversion. No other territory of acute infarct. No evidence of major vessel occlusion. Elsewhere, microvascular ischemic change is mild/expected for age. No previous infarct. No chronic hemorrhagic foci. MRA HEAD FINDINGS Symmetric carotid and vertebral arteries. Intact circle Willis with small anterior communicating artery. Symmetric vertebrobasilar branching. Symmetric poor signal in distal vertebral and proximal basilar arteries is considered artifactual. These vessels have normal signal on T2 weighted conventional imaging. Atherosclerotic type narrowings: Distal left M1 segment high-grade tandem stenoses. Moderate right proximal M2 segment stenosis. Bilateral M3 narrowing, potentially overestimated due to  artifact (poor signal in the bilateral A2 segments appears artifactual). Bilateral atherosclerotic irregularity of the proximal PCA. IMPRESSION: 1. 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 2. No acute arterial finding. 3. Intracranial atherosclerosis as described. No asymmetric disease in the proximal left PCA to correlate with #1. Electronically Signed   By: Monte Fantasia  M.D.   On: 05/14/2015 17:45     IMPRESSION AND PLAN:   75 year old female with past medical history of hiatal hernia, paraesophageal hernia, Zenker's diverticulum, hypertension, GERD who presents to the hospital due to right foot numbness and tingling.  #1 acute CVA-this is a cause of patient's right foot numbness tingling and mild weakness. -Patient's MRI of the brain was positive for acute left thalamic lacunar infarct. -Patient is on aspirin but I will switch her to Plavix. Check a lipid profile. -We'll get a carotid duplex, and two-dimensional echocardiogram. I will consult neurology for the morning.  #2 essential hypertension-patient is currently not taking any antihypertensives. She was on lisinopril but could not tolerate it. -I will start her on some low-dose HCTZ and Norvasc and follow blood pressure.  #3 history of GERD, paraesophageal hernia repair-continue Zofran as needed.   All the records are reviewed and case discussed with ED provider. Management plans discussed with the patient, family and they are in agreement.  CODE STATUS: Full  TOTAL TIME TAKING CARE OF THIS PATIENT: 45 minutes.    Henreitta Leber M.D on 05/14/2015 at 7:28 PM  Between 7am to 6pm - Pager - [phone]  After 6pm go to [email or web address] - password [email or web address] [facility]  [facility]  Office  [phone]  CC: Primary care physician; Maryland Pink, MD

## 2015-05-14 NOTE — ED Notes (Signed)
Pt states she played tennis for 3 hours yesterday and woke up today and has numbness to right leg. Pt states she feels like "it is not working right" Pt states she is trying to move and raise her leg up and is unable to do so. Pt denies any other symptoms at this time.

## 2015-05-14 NOTE — ED Provider Notes (Signed)
[facility] Emergency Department Provider Note   ____________________________________________  Time seen: ~1530  I have reviewed the triage vital signs and the nursing notes.   HISTORY  Chief Complaint Extremity Weakness   History limited by: Not Limited   HPI Stephanie Bell is a 75 y.o. female who presents to the emergency department today because of concerns for right leg numbness and weakness. She states she first noticed this symptom upon awakening this morning. She noticed she was having a hard time using the leg as she was walking down steps. She felt like she was dragging the leg. She also describes symptoms numbness to the right leg. Patient states the symptoms have been somewhat constant since she woke up this morning. She denies symptoms last night. States she did place tennis yesterday. Patient also states that she underwent a pedicure a manicure yesterday when her back was exposed to a massage chair which might have injured it. The patient denies similar symptoms in the past. Denies any headache. Denies any chest pain palpitations. Denies any fevers.    Past Medical History  Diagnosis Date  . Interstitial cystitis   . Hx: UTI (urinary tract infection)   . Weight loss   . Wound disruption, post-op, skin   . Pneumonia 05-09-11    2011 last time  . Paraesophageal hernia   . GERD (gastroesophageal reflux disease)   . H/O hiatal hernia   . Kidney stone   . 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 Esophageal stricture 11/04/2014    Patient Active Problem List   Diagnosis Date Noted  . Chest tightness 02/21/2015  . Elevated troponin 01/22/2015  . Esophageal stricture 11/04/2014  . GERD (gastroesophageal reflux disease) 11/04/2014  . Nausea without vomiting 11/04/2014  . Belching 11/04/2014  . 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 Sebaceous cyst of breast 12/20/2011  . S/P Nissen fundoplication (without gastrostomy  tube) procedure 06/08/2011    Past Surgical History  Procedure Laterality Date  . Vaginal hysterectomy    . Septoplasty    . Back surgery  05-09-11    x 2 lumbar  . Cholecystectomy  05-09-11    laparoscopic  . Tonsillectomy  05-09-11    Tonsillectomy  . Kidney stone surgery  05-09-11    past hx. and cystoscopy for bladder issues  . Laparoscopic nissen fundoplication  0000000    Procedure: LAPAROSCOPIC NISSEN FUNDOPLICATION;  Surgeon: Pedro Earls, MD;  Location: [facility];  Service: General;  Laterality: N/A;  will also remove sebaceous cyst of chest wall  . Irrigation and debridement sebaceous cyst  05/16/2011    Procedure: IRRIGATION AND DEBRIDEMENT SEBACEOUS CYST;  Surgeon: Pedro Earls, MD;  Location: [facility];  Service: General;  Laterality: N/A;  middle of chest on sternum  . Umbilical hernia repair  05/16/2011    Procedure: HERNIA REPAIR UMBILICAL ADULT;  Surgeon: Pedro Earls, MD;  Location: [facility];  Service: General;  Laterality: N/A;  with mesh   . Hernia repair  05/16/2011    hiatal hernia  . Colonoscopy  2002, 2008     rectal hyperplastic polyp 2002, diverticulosis 2008  . Laparoscopic nissen fundoplication  123456    Procedure: LAPAROSCOPIC NISSEN FUNDOPLICATION;  Surgeon: Pedro Earls, MD;  Location: [facility];  Service: General;  Laterality: N/A;  Re-do Laparoscopic Nissen   . Hiatal hernia repair  02/09/2012    Procedure: LAPAROSCOPIC REPAIR OF HIATAL HERNIA;  Surgeon: Pedro Earls, MD;  Location: [facility];  Service: General;  Laterality: N/A;  . Insertion of mesh  02/09/2012    Procedure: INSERTION OF MESH;  Surgeon: Pedro Earls, MD;  Location: [facility];  Service: General;;  . Esophagogastroduodenoscopy      Current Outpatient Rx  Name  Route  Sig  Dispense  Refill  . aspirin EC 81 MG tablet   Oral   Take 81 mg by mouth daily.         . Cholecalciferol (VITAMIN D3) 5000 UNITS CAPS   Oral   Take 5,000 Units by mouth daily.         Marland Kitchen  CRANBERRY PO   Oral   Take 2 capsules by mouth daily.         Marland Kitchen ibuprofen (ADVIL,MOTRIN) 200 MG tablet   Oral   Take 400 mg by mouth every 6 (six) hours as needed for moderate pain.         Marland Kitchen lisinopril (PRINIVIL,ZESTRIL) 5 MG tablet   Oral   Take 1 tablet (5 mg total) by mouth daily.   90 tablet   3   . Multiple Vitamin (MULTIVITAMIN WITH MINERALS) TABS tablet   Oral   Take 1 tablet by mouth daily.         . ondansetron (ZOFRAN-ODT) 4 MG disintegrating tablet   Oral   Take 1 tablet (4 mg total) by mouth every 8 (eight) hours as needed for nausea or vomiting.   30 tablet   11     Allergies Epinephrine hcl and Sulfa antibiotics  Family History  Problem Relation Age of Onset  . Colon cancer Neg Hx   . Ulcers Paternal Grandmother   . Ulcers Father   . Alcohol abuse Father   . Alzheimer's disease Mother     Social History Social History  Substance Use Topics  . Smoking status: Never Smoker   . Smokeless tobacco: Never Used  . Alcohol Use: 0.6 oz/week    1 Glasses of wine per week     Comment: Daily 4oz    Review of Systems  Constitutional: Negative for fever. Cardiovascular: Negative for chest pain. Respiratory: Negative for shortness of breath. Gastrointestinal: Negative for abdominal pain, vomiting and diarrhea. Neurological: Negative for headaches. Positive for right leg weakness and numbness.  10-point ROS otherwise negative.  ____________________________________________   PHYSICAL EXAM:  VITAL SIGNS: ED Triage Vitals  Enc Vitals Group     BP 05/14/15 1358 152/95 mmHg     Pulse Rate 05/14/15 1358 74     Resp 05/14/15 1358 18     Temp 05/14/15 1358 98.2 F (36.8 C)     Temp Source 05/14/15 1358 Oral     SpO2 05/14/15 1358 99 %     Weight 05/14/15 1358 180 lb (81.647 kg)     Height 05/14/15 1358 5\' 6"  (1.676 m)   Constitutional: Alert and oriented. Well appearing and in no distress. Eyes: Conjunctivae are normal. PERRL. Normal  extraocular movements. ENT   Head: Normocephalic and atraumatic.   Nose: No congestion/rhinnorhea.   Mouth/Throat: Mucous membranes are moist.   Neck: No stridor. Hematological/Lymphatic/Immunilogical: No cervical lymphadenopathy. Cardiovascular: Normal rate, regular rhythm.  No murmurs, rubs, or gallops. Respiratory: Normal respiratory effort without tachypnea nor retractions. Breath sounds are clear and equal bilaterally. No wheezes/rales/rhonchi. Gastrointestinal: Soft and nontender. No distention. There is no CVA tenderness. Genitourinary: Deferred Musculoskeletal: Normal range of motion in all extremities. No joint effusions.  No lower extremity tenderness nor edema. Neurologic:  Normal speech  and language. No gross focal neurologic deficits are appreciated.  Skin:  Skin is warm, dry and intact. No rash noted. Psychiatric: Mood and affect are normal. Speech and behavior are normal. Patient exhibits appropriate insight and judgment.  ____________________________________________    LABS (pertinent positives/negatives)  Labs Reviewed  CBC WITH DIFFERENTIAL/PLATELET - Abnormal; Notable for the following:    Platelets 137 (*)    All other components within normal limits  BASIC METABOLIC PANEL - Abnormal; Notable for the following:    Glucose, Bld 109 (*)    Calcium 8.6 (*)    GFR calc non Af Amer 59 (*)    All other components within normal limits  TROPONIN I  '   ____________________________________________   EKG  Apolonio Schneiders, attending physician, personally viewed and interpreted this EKG  EKG Time: 1558 Rate: 74 Rhythm: normal sinus rhythm Axis: left axis deviation Intervals: qtc 435 QRS: narrow ST changes: no st elevation Impression: abnormal ekg \  ____________________________________________    RADIOLOGY  MRI/MRA brain IMPRESSION: 1. 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 2. No acute arterial finding. 3.  Intracranial atherosclerosis as described. No asymmetric disease in the proximal left PCA to correlate with #1.  ____________________________________________   PROCEDURES  Procedure(s) performed: None  Critical Care performed: No  ____________________________________________   INITIAL IMPRESSION / ASSESSMENT AND PLAN / ED COURSE  Pertinent labs & imaging results that were available during my care of the patient were reviewed by me and considered in my medical decision making (see chart for details).  Patient presented to the emergency department today because of concerns for right leg weakness and numbness. She did experience this when she woke up. Thus her last well known time was when she went to bed yesterday. On exam no focal neuro deficits appreciated. No drift in the upper or lower extremities. MRI however was obtained which did show an acute lacunar infarct. Because of this patient will be admitted to the hospitalist service. Patient does state that she took 2 325 aspirin today.  ____________________________________________   FINAL CLINICAL IMPRESSION(S) / ED DIAGNOSES  Final diagnoses:  CVA (cerebral infarction)     Nance Pear, MD 05/14/15 [phone]

## 2015-05-14 NOTE — Progress Notes (Signed)
PHARMACIST - PHYSICIAN ORDER COMMUNICATION  CONCERNING: P&T Medication Policy on Herbal Medications  DESCRIPTION:  This patient's order for:  Cranberry & VitC  has been noted.  This product(s) is classified as an "herbal" or natural product. Due to a lack of definitive safety studies or FDA approval, nonstandard manufacturing practices, plus the potential risk of unknown drug-drug interactions while on inpatient medications, the Pharmacy and Therapeutics Committee does not permit the use of "herbal" or natural products of this type within [facility].   ACTION TAKEN: The pharmacy department is unable to verify this order at this time and your patient has been informed of this safety policy. Please reevaluate patient's clinical condition at discharge and address if the herbal or natural product(s) should be resumed at that time.

## 2015-05-15 ENCOUNTER — Inpatient Hospital Stay: Payer: PPO

## 2015-05-15 ENCOUNTER — Observation Stay
Admit: 2015-05-15 | Discharge: 2015-05-15 | Disposition: A | Discharge status: Home / Self Care | Payer: PPO | Attending: Specialist | Admitting: Specialist

## 2015-05-15 DIAGNOSIS — K219 Gastro-esophageal reflux disease without esophagitis: Secondary | ICD-10-CM | POA: Diagnosis not present

## 2015-05-15 DIAGNOSIS — I672 Cerebral atherosclerosis: Secondary | ICD-10-CM | POA: Diagnosis not present

## 2015-05-15 DIAGNOSIS — E785 Hyperlipidemia, unspecified: Secondary | ICD-10-CM | POA: Diagnosis not present

## 2015-05-15 DIAGNOSIS — I639 Cerebral infarction, unspecified: Secondary | ICD-10-CM | POA: Diagnosis not present

## 2015-05-15 DIAGNOSIS — I708 Atherosclerosis of other arteries: Secondary | ICD-10-CM | POA: Diagnosis not present

## 2015-05-15 DIAGNOSIS — G8311 Monoplegia of lower limb affecting right dominant side: Secondary | ICD-10-CM | POA: Diagnosis not present

## 2015-05-15 DIAGNOSIS — Z882 Allergy status to sulfonamides status: Secondary | ICD-10-CM | POA: Diagnosis not present

## 2015-05-15 DIAGNOSIS — I63332 Cerebral infarction due to thrombosis of left posterior cerebral artery: Secondary | ICD-10-CM | POA: Diagnosis not present

## 2015-05-15 DIAGNOSIS — I6359 Cerebral infarction due to unspecified occlusion or stenosis of other cerebral artery: Secondary | ICD-10-CM | POA: Diagnosis not present

## 2015-05-15 DIAGNOSIS — Z820 Family history of epilepsy and other diseases of the nervous system: Secondary | ICD-10-CM | POA: Diagnosis not present

## 2015-05-15 DIAGNOSIS — R200 Anesthesia of skin: Secondary | ICD-10-CM | POA: Diagnosis not present

## 2015-05-15 DIAGNOSIS — I10 Essential (primary) hypertension: Secondary | ICD-10-CM | POA: Diagnosis not present

## 2015-05-15 DIAGNOSIS — Z87442 Personal history of urinary calculi: Secondary | ICD-10-CM | POA: Diagnosis not present

## 2015-05-15 DIAGNOSIS — Z7982 Long term (current) use of aspirin: Secondary | ICD-10-CM | POA: Diagnosis not present

## 2015-05-15 DIAGNOSIS — I63513 Cerebral infarction due to unspecified occlusion or stenosis of bilateral middle cerebral arteries: Secondary | ICD-10-CM | POA: Diagnosis not present

## 2015-05-15 LAB — LIPID PANEL
CHOL/HDL RATIO: 4.9 ratio
Cholesterol: 210 mg/dL — ABNORMAL HIGH (ref 0–200)
HDL: 43 mg/dL (ref >40)
LDL CALC: 138 mg/dL — AB (ref 0–99)
Triglycerides: 147 mg/dL (ref <150)
VLDL: 29 mg/dL (ref 0–40)

## 2015-05-15 MED ORDER — HYDROCHLOROTHIAZIDE 25 MG PO TABS: 25.0000 mg | ORAL_TABLET | Freq: Every day | ORAL | Status: DC

## 2015-05-15 MED ORDER — BENZONATATE 100 MG PO CAPS
100.0000 mg | ORAL_CAPSULE | Freq: Three times a day (TID) | ORAL | Status: DC | PRN
  Administered 2015-05-15: 100 mg via ORAL
  Filled 2015-05-15: qty 1

## 2015-05-15 MED ORDER — ENOXAPARIN SODIUM 40 MG/0.4ML ~~LOC~~ SOLN
40.0000 mg | SUBCUTANEOUS | Status: DC
  Filled 2015-05-15: qty 0.4

## 2015-05-15 MED ORDER — CLOPIDOGREL BISULFATE 75 MG PO TABS: 75.0000 mg | ORAL_TABLET | Freq: Every day | ORAL | Status: DC

## 2015-05-15 MED ORDER — HYDROCOD POLST-CPM POLST ER 10-8 MG/5ML PO SUER
5.0000 mL | Freq: Two times a day (BID) | ORAL | Status: DC | PRN
  Administered 2015-05-15: 5 mL via ORAL
  Filled 2015-05-15: qty 5

## 2015-05-15 MED ORDER — ATORVASTATIN CALCIUM 40 MG PO TABS: 40.0000 mg | ORAL_TABLET | Freq: Every day | ORAL | Status: DC

## 2015-05-15 MED ORDER — BISACODYL 5 MG PO TBEC
10.0000 mg | DELAYED_RELEASE_TABLET | Freq: Every day | ORAL | Status: DC
  Administered 2015-05-15: 03:00:00 10 mg via ORAL
  Filled 2015-05-15 (×2): qty 2

## 2015-05-15 MED ORDER — ATORVASTATIN CALCIUM 20 MG PO TABS
40.0000 mg | ORAL_TABLET | Freq: Every day | ORAL | Status: DC
  Administered 2015-05-15: 40 mg via ORAL
  Filled 2015-05-15: qty 2

## 2015-05-15 NOTE — Evaluation (Signed)
Physical Therapy Evaluation Patient Details Name: Stephanie Bell MRN: [id number] DOB: 1940/04/06 Today's Date: 05/15/2015   History of Present Illness  75 yo female with onset of L lacunar infarct was cleared for carotid involvement, has evolving RUE numbness and sensory losses on RLE with normal strength.  PMHx:  CVA, HTN, back surgery  Clinical Impression  Pt is demonstrating mobility issues with sensory loss R side that has worsened since coming in per pt and husband.  Talked with nursing about the concerns and will continue to see her daily if she is going to remain in [place].  Pt has climbed steps but is not feeling good about her control of the movement, strength in LE's was [facility].    Follow Up Recommendations SNF    Equipment Recommendations  Rolling walker with 5" wheels    Recommendations for Other Services Rehab consult     Precautions / Restrictions Precautions Precautions: Fall Restrictions Weight Bearing Restrictions: No      Mobility  Bed Mobility Overal bed mobility: Needs Assistance Bed Mobility: Supine to Sit     Supine to sit: Min guard     General bed mobility comments: using HOB elevated and   Transfers Overall transfer level: Needs assistance Equipment used: Rolling walker (2 wheeled);1 person hand held assist Transfers: Sit to/from [facility] Sit to Stand: Min guard;Min assist Stand pivot transfers: Min guard;Min assist       General transfer comment: cued hand placement and help to plan movement of walker  Ambulation/Gait Ambulation/Gait assistance: Min assist;Min guard Ambulation Distance (Feet): 30 Feet Assistive device: Rolling walker (2 wheeled);1 person hand held assist Gait Pattern/deviations: Decreased stance time - right;Decreased stride length;Wide base of support;Shuffle;Step-to pattern;Step-through pattern Gait velocity: reduced Gait velocity interpretation: Below normal speed for age/gender    Stairs Stairs:  Yes Stairs assistance: Min assist Stair Management: Two rails;Step to pattern (treated RLE as "bad" leg) Number of Stairs: 4 (both directions) General stair comments: Pt is fearful of the task  Wheelchair Mobility    Modified Rankin (Stroke Patients Only)       Balance Overall balance assessment: Needs assistance Sitting-balance support: Feet supported Sitting balance-Leahy Scale: Fair   Postural control: Posterior lean Standing balance support: Bilateral upper extremity supported Standing balance-Leahy Scale: Poor Standing balance comment: struggles with RLE being trail limb                             Pertinent Vitals/Pain Pain Assessment: No/denies pain    Home Living Family/patient expects to be discharged to:: Private residence Living Arrangements: Spouse/significant other Available Help at Discharge: Family;Available 24 hours/day Type of Home: House Home Access: Stairs to enter   [employer] of Steps: 5 Home Layout: Two level;Able to live on main level with bedroom/bathroom Home Equipment: Gilford Rile - 2 wheels;Cane - single point;Crutches Additional Comments: pt is very active, plays tennis twice a week and does water aerobics    Prior Function Level of Independence: Independent               Hand Dominance   Dominant Hand: Right    Extremity/Trunk Assessment   Upper Extremity Assessment: RUE deficits/detail RUE Deficits / Details: grip is a problem by end of session, poor sensation         Lower Extremity Assessment: RLE deficits/detail RLE Deficits / Details: sensation decreased and trouble placing the foot as a result    Cervical / Trunk Assessment:  Normal  Communication   Communication: Expressive difficulties;Other (comment) (Slurred speech)  Cognition Arousal/Alertness: Awake/alert Behavior During Therapy: WFL for tasks assessed/performed Overall Cognitive Status: Within Functional Limits for tasks assessed                       General Comments General comments (skin integrity, edema, etc.): Pt is planning to go home with husband but worried about her symptoms.  Spoke with nursing about pt report that her hand is worse than yesterday and speech more slurred     Exercises        Assessment/Plan    PT Assessment Patient needs continued PT services  PT Diagnosis Difficulty walking;Hemiplegia dominant side   PT Problem List Decreased activity tolerance;Decreased balance;Decreased mobility;Decreased coordination;Decreased knowledge of use of DME;Cardiopulmonary status limiting activity;Impaired sensation  PT Treatment Interventions DME instruction;Gait training;Stair training;Functional mobility training;Therapeutic activities;Therapeutic exercise;Balance training;Neuromuscular re-education;Patient/family education   PT Goals (Current goals can be found in the Care Plan section) Acute Rehab PT Goals Patient Stated Goal: to be safe in mobility at home PT Goal Formulation: With patient/family Time For Goal Achievement: 05/29/15    Frequency Min 2X/week   Barriers to discharge Inaccessible home environment stairs on all entrances    Co-evaluation               End of Session Equipment Utilized During Treatment: Gait belt Activity Tolerance: Patient tolerated treatment well;Patient limited by fatigue;Other (comment) (numbness and placement of RLE) Patient left: in chair;with call bell/phone within reach;with family/visitor present Nurse Communication: Mobility status         Time: [id number] PT Time Calculation (min) (ACUTE ONLY): 28 min   Charges:   PT Evaluation $PT Eval Moderate Complexity: 1 Procedure PT Treatments $Gait Training: 8-22 mins   PT G Codes:        Ramond Dial 2015-05-28, 1:11 PM  Mee Hives, PT MS Acute Rehab Dept. Number: [facility] [id number] and [place] [phone]

## 2015-05-15 NOTE — Consult Note (Signed)
CC: stroke  HPI: Stephanie Bell is an 75 y.o. female with a known history of hypertension, GERD, history of hiatal hernia status post repair, esophageal stricture, Zenker's diverticulum, paraesophageal hernia, who presents to the hospital today due to right foot and leg numbness. Patient says she woke up yesterday and felt that her right foot was numb and not acting appropriately. 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  Ultrasound no hemodynamic stenosis. Was on ASA at home.   Past Medical History  Diagnosis Date  . Interstitial cystitis   . Hx: UTI (urinary tract infection)   . Weight loss   . Wound disruption, post-op, skin   . Pneumonia 05-09-11    2011 last time  . Paraesophageal hernia   . GERD (gastroesophageal reflux disease)   . H/O hiatal hernia   . Kidney stone   . 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 Esophageal stricture 11/04/2014    Past Surgical History  Procedure Laterality Date  . Vaginal hysterectomy    . Septoplasty    . Back surgery  05-09-11    x 2 lumbar  . Cholecystectomy  05-09-11    laparoscopic  . Tonsillectomy  05-09-11    Tonsillectomy  . Kidney stone surgery  05-09-11    past hx. and cystoscopy for bladder issues  . Laparoscopic nissen fundoplication  0000000    Procedure: LAPAROSCOPIC NISSEN FUNDOPLICATION;  Surgeon: Pedro Earls, MD;  Location: [facility];  Service: General;  Laterality: N/A;  will also remove sebaceous cyst of chest wall  . Irrigation and debridement sebaceous cyst  05/16/2011    Procedure: IRRIGATION AND DEBRIDEMENT SEBACEOUS CYST;  Surgeon: Pedro Earls, MD;  Location: [facility];  Service: General;  Laterality: N/A;  middle of chest on sternum  . Umbilical hernia repair  05/16/2011    Procedure: HERNIA REPAIR UMBILICAL ADULT;  Surgeon: Pedro Earls, MD;  Location: [facility];  Service: General;  Laterality: N/A;  with mesh   . Hernia repair  05/16/2011    hiatal hernia  . Colonoscopy  2002, 2008      rectal hyperplastic polyp 2002, diverticulosis 2008  . Laparoscopic nissen fundoplication  [id number]    Procedure: LAPAROSCOPIC NISSEN FUNDOPLICATION;  Surgeon: Pedro Earls, MD;  Location: [facility];  Service: General;  Laterality: N/A;  Re-do Laparoscopic Nissen   . Hiatal hernia repair  02/09/2012    Procedure: LAPAROSCOPIC REPAIR OF HIATAL HERNIA;  Surgeon: Pedro Earls, MD;  Location: [facility];  Service: General;  Laterality: N/A;  . Insertion of mesh  02/09/2012    Procedure: INSERTION OF MESH;  Surgeon: Pedro Earls, MD;  Location: [facility];  Service: General;;  . Esophagogastroduodenoscopy      Family History  Problem Relation Age of Onset  . Colon cancer Neg Hx   . Ulcers Paternal Grandmother   . Ulcers Father   . Alcohol abuse Father   . Alzheimer's disease Mother     Social History:  reports that she has never smoked. She has never used smokeless tobacco. She reports that she drinks about 0.6 oz of alcohol per week. She reports that she does not use illicit drugs.  Allergies  Allergen Reactions  . Epinephrine Hcl Palpitations and Other (See Comments)    Reaction:  Tachycardia   . Sulfa Antibiotics Swelling and Rash    Medications: I have reviewed the patient's current medications.  ROS: History obtained from the patient  General ROS: negative for -  chills, fatigue, fever, night sweats, weight gain or weight loss Psychological ROS: negative for - behavioral disorder, hallucinations, memory difficulties, mood swings or suicidal ideation Ophthalmic ROS: negative for - blurry vision, double vision, eye pain or loss of vision ENT ROS: negative for - epistaxis, nasal discharge, oral lesions, sore throat, tinnitus or vertigo Allergy and Immunology ROS: negative for - hives or itchy/watery eyes Hematological and Lymphatic ROS: negative for - bleeding problems, bruising or swollen lymph nodes Endocrine ROS: negative for - galactorrhea, hair pattern changes,  polydipsia/polyuria or temperature intolerance Respiratory ROS: negative for - cough, hemoptysis, shortness of breath or wheezing Cardiovascular ROS: negative for - chest pain, dyspnea on exertion, edema or irregular heartbeat Gastrointestinal ROS: negative for - abdominal pain, diarrhea, hematemesis, nausea/vomiting or stool incontinence Genito-Urinary ROS: negative for - dysuria, hematuria, incontinence or urinary frequency/urgency Musculoskeletal ROS: negative for - joint swelling or muscular weakness Neurological ROS: as noted in HPI Dermatological ROS: negative for rash and skin lesion changes  Physical Examination: Blood pressure 121/72, pulse 88, temperature 98 F (36.7 C), temperature source Oral, resp. rate 19, height 5\' 6"  (1.676 m), weight 187 lb 4.8 oz (84.959 kg), SpO2 98 %.    Neurological Examination Mental Status: Alert, oriented, thought content appropriate.  Speech fluent without evidence of aphasia.  Able to follow 3 step commands without difficulty. Cranial Nerves: II: Discs flat bilaterally; Visual fields grossly normal, pupils equal, round, reactive to light and accommodation III,IV, VI: ptosis not present, extra-ocular motions intact bilaterally V,VII: smile symmetric, facial light touch sensation normal bilaterally VIII: hearing normal bilaterally IX,X: gag reflex present XI: bilateral shoulder shrug XII: midline tongue extension Motor: Right : Upper extremity   5/5    Left:     Upper extremity   5/5  Lower extremity   5/5  proximally, 4-/5 distally    Lower extremity   5/5 Tone and bulk:normal tone throughout; no atrophy noted Sensory: Pinprick and light touch intact throughout, bilaterally Deep Tendon Reflexes: 1+ and symmetric throughout Plantars: Right: downgoing   Left: downgoing Cerebellar: normal finger-to-nose, normal rapid alternating movements and normal heel-to-shin test Gait: not tested       Laboratory Studies:   Basic Metabolic  Panel:  Recent Labs Lab 05/14/15 1604  NA 135  K 3.7  CL 103  CO2 25  GLUCOSE 109*  BUN 10  CREATININE 0.92  CALCIUM 8.6*    Liver Function Tests: No results for input(s): AST, ALT, ALKPHOS, BILITOT, PROT, ALBUMIN in the last 168 hours. No results for input(s): LIPASE, AMYLASE in the last 168 hours. No results for input(s): AMMONIA in the last 168 hours.  CBC:  Recent Labs Lab 05/14/15 1604  WBC 6.4  NEUTROABS 4.1  HGB 13.4  HCT 39.0  MCV 83.5  PLT 137*    Cardiac Enzymes:  Recent Labs Lab 05/14/15 1604  TROPONINI <0.03    BNP: Invalid input(s): POCBNP  CBG: No results for input(s): GLUCAP in the last 168 hours.  Microbiology: Results for orders placed or performed during the hospital encounter of 02/05/12  Surgical pcr screen     Status: None   Collection Time: 02/05/12 11:24 AM  Result Value Ref Range Status   MRSA, PCR NEGATIVE NEGATIVE Final   Staphylococcus aureus NEGATIVE NEGATIVE Final    Comment:        The Xpert SA Assay (FDA approved for NASAL specimens in patients over 4 years of age), is one component of a comprehensive surveillance program.  Test performance  has been validated by [employer] for patients greater than or equal to 8 year old. It is not intended to diagnose infection nor to guide or monitor treatment.    Coagulation Studies: No results for input(s): LABPROT, INR in the last 72 hours.  Urinalysis: No results for input(s): COLORURINE, LABSPEC, PHURINE, GLUCOSEU, HGBUR, BILIRUBINUR, KETONESUR, PROTEINUR, UROBILINOGEN, NITRITE, LEUKOCYTESUR in the last 168 hours.  Invalid input(s): APPERANCEUR  Lipid Panel:     Component Value Date/Time   CHOL 210* 05/15/2015 0416   TRIG 147 05/15/2015 0416   HDL 43 05/15/2015 0416   CHOLHDL 4.9 05/15/2015 0416   VLDL 29 05/15/2015 0416   LDLCALC 138* 05/15/2015 0416    HgbA1C:  Lab Results  Component Value Date   HGBA1C 5.2 02/21/2015    Urine Drug Screen:  No  results found for: LABOPIA, COCAINSCRNUR, LABBENZ, AMPHETMU, THCU, LABBARB  Alcohol Level: No results for input(s): ETH in the last 168 hours.  Other results: EKG: normal EKG, normal sinus rhythm, unchanged from previous tracings.  Imaging: Mr Virgel Paling [id number] Contrast  05/14/2015  CLINICAL DATA:  Right leg numbness upon awakening.  Weakness. EXAM: MRI HEAD WITHOUT CONTRAST MRA HEAD WITHOUT CONTRAST TECHNIQUE: Multiplanar, multiecho pulse sequences of the brain and surrounding structures were obtained without intravenous contrast. Angiographic images of the head were obtained using MRA technique without contrast. COMPARISON:  None. 10/10/2011 FINDINGS: MRI HEAD FINDINGS Calvarium and upper cervical spine: No focal marrow signal abnormality. Orbits: Negative. Sinuses and Mastoids: Mucosal thickening on the left, overall mild. No effusion. Brain: Ovoid 1 cm area of restricted diffusion in the lateral left thalamus, extending into the posterior limb internal capsule. No hemorrhagic conversion. No other territory of acute infarct. No evidence of major vessel occlusion. Elsewhere, microvascular ischemic change is mild/expected for age. No previous infarct. No chronic hemorrhagic foci. MRA HEAD FINDINGS Symmetric carotid and vertebral arteries. Intact circle Willis with small anterior communicating artery. Symmetric vertebrobasilar branching. Symmetric poor signal in distal vertebral and proximal basilar arteries is considered artifactual. These vessels have normal signal on T2 weighted conventional imaging. Atherosclerotic type narrowings: Distal left M1 segment high-grade tandem stenoses. Moderate right proximal M2 segment stenosis. Bilateral M3 narrowing, potentially overestimated due to artifact (poor signal in the bilateral A2 segments appears artifactual). Bilateral atherosclerotic irregularity of the proximal PCA. IMPRESSION: 1. 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 2. No acute arterial  finding. 3. Intracranial atherosclerosis as described. No asymmetric disease in the proximal left PCA to correlate with #1. Electronically Signed   By: Monte Fantasia M.D.   On: 05/14/2015 17:45   Mr Brain Wo Contrast  05/14/2015  CLINICAL DATA:  Right leg numbness upon awakening.  Weakness. EXAM: MRI HEAD WITHOUT CONTRAST MRA HEAD WITHOUT CONTRAST TECHNIQUE: Multiplanar, multiecho pulse sequences of the brain and surrounding structures were obtained without intravenous contrast. Angiographic images of the head were obtained using MRA technique without contrast. COMPARISON:  None. 10/10/2011 FINDINGS: MRI HEAD FINDINGS Calvarium and upper cervical spine: No focal marrow signal abnormality. Orbits: Negative. Sinuses and Mastoids: Mucosal thickening on the left, overall mild. No effusion. Brain: Ovoid 1 cm area of restricted diffusion in the lateral left thalamus, extending into the posterior limb internal capsule. No hemorrhagic conversion. No other territory of acute infarct. No evidence of major vessel occlusion. Elsewhere, microvascular ischemic change is mild/expected for age. No previous infarct. No chronic hemorrhagic foci. MRA HEAD FINDINGS Symmetric carotid and vertebral arteries. Intact circle Willis with small anterior communicating artery. Symmetric  vertebrobasilar branching. Symmetric poor signal in distal vertebral and proximal basilar arteries is considered artifactual. These vessels have normal signal on T2 weighted conventional imaging. Atherosclerotic type narrowings: Distal left M1 segment high-grade tandem stenoses. Moderate right proximal M2 segment stenosis. Bilateral M3 narrowing, potentially overestimated due to artifact (poor signal in the bilateral A2 segments appears artifactual). Bilateral atherosclerotic irregularity of the proximal PCA. IMPRESSION: 1. 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 2. No acute arterial finding. 3. Intracranial atherosclerosis as described.  No asymmetric disease in the proximal left PCA to correlate with #1. Electronically Signed   By: Monte Fantasia M.D.   On: 05/14/2015 17:45   US Carotid Bilateral  05/15/2015  CLINICAL DATA:  75 year old female with a history of CVA. Cardiovascular risk factors include hyperlipidemia. EXAM: BILATERAL CAROTID DUPLEX ULTRASOUND TECHNIQUE: Pearline Cables scale imaging, color Doppler and duplex ultrasound were performed of bilateral carotid and vertebral arteries in the neck. COMPARISON:  MR 05/14/2015, no prior duplex FINDINGS: Criteria: Quantification of carotid stenosis is based on velocity parameters that correlate the residual internal carotid diameter with NASCET-based stenosis levels, using the diameter of the distal internal carotid lumen as the denominator for stenosis measurement. The following velocity measurements were obtained: RIGHT ICA:  Systolic [id number] cm/sec, Diastolic 27 cm/sec CCA:  [id number] cm/sec SYSTOLIC ICA/CCA RATIO:  1.0 ECA:  101 cm/sec LEFT ICA:  Systolic 81 cm/sec, Diastolic 23 cm/sec CCA:  [id number] cm/sec SYSTOLIC ICA/CCA RATIO:  0.7 ECA:  96 cm/sec Right Brachial SBP: Not acquired Left Brachial SBP: Not acquired RIGHT CAROTID ARTERY: Atherosclerotic changes of the right common carotid artery. Intermediate waveform maintained. Heterogeneous and partially calcified plaque at the right carotid bifurcation. Shadowing across the lumen secondary to near field calcifications. Low resistance waveform of the right ICA. No significant tortuosity. RIGHT VERTEBRAL ARTERY: Antegrade flow with low resistance waveform. LEFT CAROTID ARTERY: Atherosclerotic changes of the left common carotid artery. Intermediate waveform maintained. Heterogeneous and partially calcified plaque at the left carotid bifurcation without significant lumen shadowing. Low resistance waveform of the left ICA. No significant tortuosity. LEFT VERTEBRAL ARTERY:  Antegrade flow with low resistance waveform. Right thyroid nodule identified measuring 1.2  cm x 1.1 cm x 1.4 cm, with internal calcium/colloid. IMPRESSION: Color duplex indicates moderate heterogeneous and calcified plaque, with no hemodynamically significant stenosis by duplex criteria in the extracranial cerebrovascular circulation. A right thyroid nodules present measuring as great as 1.4 cm. Findings meet consensus criteria for biopsy, however, in a patient of this age with no thyroid risk factors a surveillance strategy would also be reasonable as thyroid cancers <2cm are known to be indolent with nearly 100% 10-year survival. If a biopsy is desired, would recommend a diagnostic ultrasound survey, and referral for evaluation. - Managing incidental thyroid nodules detected on imaging: white paper of the ACR Incidental Thyroid Findings Committee. J Am Coll Radiol. 2015 Feb;12(2):143-50. - Management of Thyroid Nodules Detected at [place]: Society of Radiologists in [place]. Radiology 2005; [id number]. Signed, Dulcy Fanny. Earleen Newport, DO [facility] Radiology Electronically Signed   By: Corrie Mckusick D.O.   On: 05/15/2015 09:58     Assessment/Plan:  75 y.o. female with a known history of hypertension, GERD, history of hiatal hernia status post repair, esophageal stricture, Zenker's diverticulum, paraesophageal hernia, who presents to the hospital today due to right foot and leg numbness. Patient says she woke up yesterday and felt that her right foot was numb and not acting appropriately. Pt has small lacunar  infarct in the L thalamic region.  Ultrasound no hemodynamic stenosis. Was on ASA at home.   Stroke work up complete but pt states had a period of dysarthria and increased RLE weakness that has improved.  This is not pressure dependent as there is no large vessel involvement, but small vessel dz as pt has significant atherosclerotic dz.  - Agree with plavix and statin - CTH repeat ordered - observation overnight -  re eval by PT tomorrow and likely d/c tomorrow - d/w primary team as well as pt and husband at bedside.    Leotis Pain   05/15/2015, 1:36 PM

## 2015-05-15 NOTE — Progress Notes (Signed)
[facility] NAME: Stephanie Bell    MR#:  [id number]  DATE OF BIRTH:  20-Sep-1940  SUBJECTIVE:   Pt. Here due right foot weakness, numbness and noted to have an acute CVA. No headache, nausea, vomiting today. Still has significant sensory loss of the right foot.  REVIEW OF SYSTEMS:    Review of Systems  Constitutional: Negative for fever and chills.  HENT: Negative for congestion and tinnitus.   Eyes: Negative for blurred vision and double vision.  Respiratory: Negative for cough, shortness of breath and wheezing.   Cardiovascular: Negative for chest pain, orthopnea and PND.  Gastrointestinal: Negative for nausea, vomiting, abdominal pain and diarrhea.  Genitourinary: Negative for dysuria and hematuria.  Neurological: Positive for sensory change (Right foot numbness). Negative for dizziness and focal weakness.  All other systems reviewed and are negative.   Nutrition: Heart Healthy Tolerating Diet: Yes Tolerating PT: Eval Noted.   DRUG ALLERGIES:   Allergies  Allergen Reactions  . Epinephrine Hcl Palpitations and Other (See Comments)    Reaction:  Tachycardia   . Sulfa Antibiotics Swelling and Rash    VITALS:  Blood pressure 121/72, pulse 88, temperature 98 F (36.7 C), temperature source Oral, resp. rate 19, height 5\' 6"  (1.676 m), weight 84.959 kg (187 lb 4.8 oz), SpO2 98 %.  PHYSICAL EXAMINATION:   Physical Exam  GENERAL:  75 y.o.-year-old patient lying in the bed with no acute distress.  EYES: Pupils equal, round, reactive to light and accommodation. No scleral icterus. Extraocular muscles intact.  HEENT: Head atraumatic, normocephalic. Oropharynx and nasopharynx clear.  NECK:  Supple, no jugular venous distention. No thyroid enlargement, no tenderness.  LUNGS: Normal breath sounds bilaterally, no wheezing, rales, rhonchi. No use of accessory muscles of respiration.  CARDIOVASCULAR: S1, S2 normal. No  murmurs, rubs, or gallops.  ABDOMEN: Soft, nontender, nondistended. Bowel sounds present. No organomegaly or mass.  EXTREMITIES: No cyanosis, clubbing or edema b/l.    NEUROLOGIC: Cranial nerves II through XII are intact. Right foot sensory loss. No other focal motor or sensory deficits b/l.   PSYCHIATRIC: The patient is alert and oriented x 3. Good affect.  SKIN: No obvious rash, lesion, or ulcer.    LABORATORY PANEL:   CBC  Recent Labs Lab 05/14/15 1604  WBC 6.4  HGB 13.4  HCT 39.0  PLT 137*   ------------------------------------------------------------------------------------------------------------------  Chemistries   Recent Labs Lab 05/14/15 1604  NA 135  K 3.7  CL 103  CO2 25  GLUCOSE 109*  BUN 10  CREATININE 0.92  CALCIUM 8.6*   ------------------------------------------------------------------------------------------------------------------  Cardiac Enzymes  Recent Labs Lab 05/14/15 1604  TROPONINI <0.03   ------------------------------------------------------------------------------------------------------------------  RADIOLOGY:  Mr Jodene Nam Head Wo Contrast  05/14/2015  CLINICAL DATA:  Right leg numbness upon awakening.  Weakness. EXAM: MRI HEAD WITHOUT CONTRAST MRA HEAD WITHOUT CONTRAST TECHNIQUE: Multiplanar, multiecho pulse sequences of the brain and surrounding structures were obtained without intravenous contrast. Angiographic images of the head were obtained using MRA technique without contrast. COMPARISON:  None. 10/10/2011 FINDINGS: MRI HEAD FINDINGS Calvarium and upper cervical spine: No focal marrow signal abnormality. Orbits: Negative. Sinuses and Mastoids: Mucosal thickening on the left, overall mild. No effusion. Brain: Ovoid 1 cm area of restricted diffusion in the lateral left thalamus, extending into the posterior limb internal capsule. No hemorrhagic conversion. No other territory of acute infarct. No evidence of major vessel occlusion.  Elsewhere, microvascular ischemic change is mild/expected for age. No  previous infarct. No chronic hemorrhagic foci. MRA HEAD FINDINGS Symmetric carotid and vertebral arteries. Intact circle Willis with small anterior communicating artery. Symmetric vertebrobasilar branching. Symmetric poor signal in distal vertebral and proximal basilar arteries is considered artifactual. These vessels have normal signal on T2 weighted conventional imaging. Atherosclerotic type narrowings: Distal left M1 segment high-grade tandem stenoses. Moderate right proximal M2 segment stenosis. Bilateral M3 narrowing, potentially overestimated due to artifact (poor signal in the bilateral A2 segments appears artifactual). Bilateral atherosclerotic irregularity of the proximal PCA. IMPRESSION: 1. 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 2. No acute arterial finding. 3. Intracranial atherosclerosis as described. No asymmetric disease in the proximal left PCA to correlate with #1. Electronically Signed   By: Monte Fantasia M.D.   On: 05/14/2015 17:45   Mr Brain Wo Contrast  05/14/2015  CLINICAL DATA:  Right leg numbness upon awakening.  Weakness. EXAM: MRI HEAD WITHOUT CONTRAST MRA HEAD WITHOUT CONTRAST TECHNIQUE: Multiplanar, multiecho pulse sequences of the brain and surrounding structures were obtained without intravenous contrast. Angiographic images of the head were obtained using MRA technique without contrast. COMPARISON:  None. 10/10/2011 FINDINGS: MRI HEAD FINDINGS Calvarium and upper cervical spine: No focal marrow signal abnormality. Orbits: Negative. Sinuses and Mastoids: Mucosal thickening on the left, overall mild. No effusion. Brain: Ovoid 1 cm area of restricted diffusion in the lateral left thalamus, extending into the posterior limb internal capsule. No hemorrhagic conversion. No other territory of acute infarct. No evidence of major vessel occlusion. Elsewhere, microvascular ischemic change is  mild/expected for age. No previous infarct. No chronic hemorrhagic foci. MRA HEAD FINDINGS Symmetric carotid and vertebral arteries. Intact circle Willis with small anterior communicating artery. Symmetric vertebrobasilar branching. Symmetric poor signal in distal vertebral and proximal basilar arteries is considered artifactual. These vessels have normal signal on T2 weighted conventional imaging. Atherosclerotic type narrowings: Distal left M1 segment high-grade tandem stenoses. Moderate right proximal M2 segment stenosis. Bilateral M3 narrowing, potentially overestimated due to artifact (poor signal in the bilateral A2 segments appears artifactual). Bilateral atherosclerotic irregularity of the proximal PCA. IMPRESSION: 1. 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 2. No acute arterial finding. 3. Intracranial atherosclerosis as described. No asymmetric disease in the proximal left PCA to correlate with #1. Electronically Signed   By: Monte Fantasia M.D.   On: 05/14/2015 17:45   US Carotid Bilateral  05/15/2015  CLINICAL DATA:  75 year old female with a history of CVA. Cardiovascular risk factors include hyperlipidemia. EXAM: BILATERAL CAROTID DUPLEX ULTRASOUND TECHNIQUE: Pearline Cables scale imaging, color Doppler and duplex ultrasound were performed of bilateral carotid and vertebral arteries in the neck. COMPARISON:  MR 05/14/2015, no prior duplex FINDINGS: Criteria: Quantification of carotid stenosis is based on velocity parameters that correlate the residual internal carotid diameter with NASCET-based stenosis levels, using the diameter of the distal internal carotid lumen as the denominator for stenosis measurement. The following velocity measurements were obtained: RIGHT ICA:  Systolic XX123456 cm/sec, Diastolic 27 cm/sec CCA:  [id number] cm/sec SYSTOLIC ICA/CCA RATIO:  1.0 ECA:  101 cm/sec LEFT ICA:  Systolic 81 cm/sec, Diastolic 23 cm/sec CCA:  99991111 cm/sec SYSTOLIC ICA/CCA RATIO:  0.7 ECA:  96 cm/sec Right  Brachial SBP: Not acquired Left Brachial SBP: Not acquired RIGHT CAROTID ARTERY: Atherosclerotic changes of the right common carotid artery. Intermediate waveform maintained. Heterogeneous and partially calcified plaque at the right carotid bifurcation. Shadowing across the lumen secondary to near field calcifications. Low resistance waveform of the right ICA. No significant tortuosity. RIGHT VERTEBRAL ARTERY:  Antegrade flow with low resistance waveform. LEFT CAROTID ARTERY: Atherosclerotic changes of the left common carotid artery. Intermediate waveform maintained. Heterogeneous and partially calcified plaque at the left carotid bifurcation without significant lumen shadowing. Low resistance waveform of the left ICA. No significant tortuosity. LEFT VERTEBRAL ARTERY:  Antegrade flow with low resistance waveform. Right thyroid nodule identified measuring 1.2 cm x 1.1 cm x 1.4 cm, with internal calcium/colloid. IMPRESSION: Color duplex indicates moderate heterogeneous and calcified plaque, with no hemodynamically significant stenosis by duplex criteria in the extracranial cerebrovascular circulation. A right thyroid nodules present measuring as great as 1.4 cm. Findings meet consensus criteria for biopsy, however, in a patient of this age with no thyroid risk factors a surveillance strategy would also be reasonable as thyroid cancers <2cm are known to be indolent with nearly 100% 10-year survival. If a biopsy is desired, would recommend a diagnostic ultrasound survey, and referral for evaluation. - Managing incidental thyroid nodules detected on imaging: white paper of the ACR Incidental Thyroid Findings Committee. J Am Coll Radiol. 2015 Feb;12(2):143-50. - Management of Thyroid Nodules Detected at [place]: Society of Radiologists in [place]. Radiology 2005; [id number]. Signed, Dulcy Fanny. Earleen Newport, DO [facility] Electronically Signed    By: Corrie Mckusick D.O.   On: 05/15/2015 09:58     ASSESSMENT AND PLAN:   75 year old female with past medical history of hiatal hernia, paraesophageal hernia, Zenker's diverticulum, hypertension, GERD who presents to the hospital due to right foot numbness and tingling.  #1 acute CVA-this is a cause of patient's right foot numbness tingling and mild weakness. -Patient's MRI of the brain was positive for acute left thalamic lacunar infarct. - cont. Plavix, Atorvastatin.  Carotid Duplex (-).  Echo pending.  - Seen by physical therapy and they recommended short-term rehabilitation. They will reassess the patient tomorrow. -Seen by neurology and patient had some slurred speech and some right hand weakness and they will get a CT head to r/o evolving CVA.  - cont. Supportive care as mentioned above.   #2 essential hypertension-BP improved.  - cont. HCTZ, Norvasc for now.  #3 history of GERD, paraesophageal hernia repair-continue Zofran as needed.   All the records are reviewed and case discussed with Care Management/Social Workerr. Management plans discussed with the patient, family and they are in agreement.  CODE STATUS: Full  DVT Prophylaxis: Lovenox  TOTAL TIME TAKING CARE OF THIS PATIENT: 30 minutes.   POSSIBLE D/C IN 1-2 DAYS, DEPENDING ON CLINICAL CONDITION.   Henreitta Leber M.D on 05/15/2015 at 1:35 PM  Between 7am to 6pm - Pager - [phone]  After 6pm go to [email or web address] - password [email or web address] [facility]  [facility]  Office  [phone]  CC: Primary care physician; [place] Pink, MD

## 2015-05-15 NOTE — Progress Notes (Signed)
Alert and oriented, persistent right side weakness, discharge was suspended bc Physical therapy observed increased weakness during evaluation. Neurologist notified, head CT performed with no acute changes, husband at bedside, started on atorvastatin, US carotids performed in am. Fair appetite.

## 2015-05-15 NOTE — Care Management Note (Addendum)
Case Management Note  Patient Details  Name: Stephanie Bell MRN: [id number] Date of Birth: [birth year]  Subjective/Objective:    Discussed discharge planning with [name]. [name] has no home health agency preference but settled upon [name] to be her home health PT provider. A referral was faxed to [name]. [name] was informed that someone from [name] would contact her within 48 hours to set up an appointment for in-home PT.                Action/Plan:   Expected Discharge Date:                  Expected Discharge Plan:     In-House Referral:     Discharge planning Services     Post Acute Care Choice:    Choice offered to:     DME Arranged:    DME Agency:     HH Arranged:    [name] Agency:     Status of Service:     Medicare Important Message Given:    Date Medicare IM Given:    Medicare IM give by:    Date Additional Medicare IM Given:    Additional Medicare Important Message give by:     If discussed at [place] of Stay Meetings, dates discussed:    Additional Comments:  [name] All A, RN 05/15/2015, 11:42 AM

## 2015-05-16 NOTE — Consult Note (Signed)
CC: stroke  HPI: Stephanie Bell is an 75 y.o. female with a known history of hypertension, GERD, history of hiatal hernia status post repair, esophageal stricture, Zenker's diverticulum, paraesophageal hernia, who presents to the hospital today due to right foot and leg numbness. Patient says she woke up yesterday and felt that her right foot was numb and not acting appropriately. 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  Ultrasound no hemodynamic stenosis. Was on ASA at home.   Past Medical History  Diagnosis Date  . Interstitial cystitis   . Hx: UTI (urinary tract infection)   . Weight loss   . Wound disruption, post-op, skin   . Pneumonia 05-09-11    2011 last time  . Paraesophageal hernia   . GERD (gastroesophageal reflux disease)   . H/O hiatal hernia   . Kidney stone   . 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 Esophageal stricture 11/04/2014    Past Surgical History  Procedure Laterality Date  . Vaginal hysterectomy    . Septoplasty    . Back surgery  05-09-11    x 2 lumbar  . Cholecystectomy  05-09-11    laparoscopic  . Tonsillectomy  05-09-11    Tonsillectomy  . Kidney stone surgery  05-09-11    past hx. and cystoscopy for bladder issues  . Laparoscopic nissen fundoplication  0000000    Procedure: LAPAROSCOPIC NISSEN FUNDOPLICATION;  Surgeon: Pedro Earls, MD;  Location: [facility];  Service: General;  Laterality: N/A;  will also remove sebaceous cyst of chest wall  . Irrigation and debridement sebaceous cyst  05/16/2011    Procedure: IRRIGATION AND DEBRIDEMENT SEBACEOUS CYST;  Surgeon: Pedro Earls, MD;  Location: [facility];  Service: General;  Laterality: N/A;  middle of chest on sternum  . Umbilical hernia repair  05/16/2011    Procedure: HERNIA REPAIR UMBILICAL ADULT;  Surgeon: Pedro Earls, MD;  Location: [facility];  Service: General;  Laterality: N/A;  with mesh   . Hernia repair  05/16/2011    hiatal hernia  . Colonoscopy  2002, 2008      rectal hyperplastic polyp 2002, diverticulosis 2008  . Laparoscopic nissen fundoplication  [id number]    Procedure: LAPAROSCOPIC NISSEN FUNDOPLICATION;  Surgeon: Pedro Earls, MD;  Location: [facility];  Service: General;  Laterality: N/A;  Re-do Laparoscopic Nissen   . Hiatal hernia repair  02/09/2012    Procedure: LAPAROSCOPIC REPAIR OF HIATAL HERNIA;  Surgeon: Pedro Earls, MD;  Location: [facility];  Service: General;  Laterality: N/A;  . Insertion of mesh  02/09/2012    Procedure: INSERTION OF MESH;  Surgeon: Pedro Earls, MD;  Location: [facility];  Service: General;;  . Esophagogastroduodenoscopy      Family History  Problem Relation Age of Onset  . Colon cancer Neg Hx   . Ulcers Paternal Grandmother   . Ulcers Father   . Alcohol abuse Father   . Alzheimer's disease Mother     Social History:  reports that she has never smoked. She has never used smokeless tobacco. She reports that she drinks about 0.6 oz of alcohol per week. She reports that she does not use illicit drugs.  Allergies  Allergen Reactions  . Epinephrine Hcl Palpitations and Other (See Comments)    Reaction:  Tachycardia   . Sulfa Antibiotics Swelling and Rash    Medications: I have reviewed the patient's current medications.  ROS: History obtained from the patient  General ROS: negative for -  chills, fatigue, fever, night sweats, weight gain or weight loss Psychological ROS: negative for - behavioral disorder, hallucinations, memory difficulties, mood swings or suicidal ideation Ophthalmic ROS: negative for - blurry vision, double vision, eye pain or loss of vision ENT ROS: negative for - epistaxis, nasal discharge, oral lesions, sore throat, tinnitus or vertigo Allergy and Immunology ROS: negative for - hives or itchy/watery eyes Hematological and Lymphatic ROS: negative for - bleeding problems, bruising or swollen lymph nodes Endocrine ROS: negative for - galactorrhea, hair pattern changes,  polydipsia/polyuria or temperature intolerance Respiratory ROS: negative for - cough, hemoptysis, shortness of breath or wheezing Cardiovascular ROS: negative for - chest pain, dyspnea on exertion, edema or irregular heartbeat Gastrointestinal ROS: negative for - abdominal pain, diarrhea, hematemesis, nausea/vomiting or stool incontinence Genito-Urinary ROS: negative for - dysuria, hematuria, incontinence or urinary frequency/urgency Musculoskeletal ROS: negative for - joint swelling or muscular weakness Neurological ROS: as noted in HPI Dermatological ROS: negative for rash and skin lesion changes  Physical Examination: Blood pressure 106/62, pulse 69, temperature 97.7 F (36.5 C), temperature source Oral, resp. rate 19, height 5\' 6"  (1.676 m), weight 187 lb 4.8 oz (84.959 kg), SpO2 93 %.    Neurological Examination Mental Status: Alert, oriented, thought content appropriate.  Speech fluent without evidence of aphasia.  Able to follow 3 step commands without difficulty. Cranial Nerves: II: Discs flat bilaterally; Visual fields grossly normal, pupils equal, round, reactive to light and accommodation III,IV, VI: ptosis not present, extra-ocular motions intact bilaterally V,VII: smile symmetric, facial light touch sensation normal bilaterally VIII: hearing normal bilaterally IX,X: gag reflex present XI: bilateral shoulder shrug XII: midline tongue extension Motor: Right : Upper extremity   5/5    Left:     Upper extremity   5/5  Lower extremity   5/5  proximally, 4-/5 distally    Lower extremity   5/5 Tone and bulk:normal tone throughout; no atrophy noted Sensory: Pinprick and light touch intact throughout, bilaterally Deep Tendon Reflexes: 1+ and symmetric throughout Plantars: Right: downgoing   Left: downgoing Cerebellar: normal finger-to-nose, normal rapid alternating movements and normal heel-to-shin test Gait: not tested       Laboratory Studies:   Basic Metabolic  Panel:  Recent Labs Lab 05/14/15 1604  NA 135  K 3.7  CL 103  CO2 25  GLUCOSE 109*  BUN 10  CREATININE 0.92  CALCIUM 8.6*    Liver Function Tests: No results for input(s): AST, ALT, ALKPHOS, BILITOT, PROT, ALBUMIN in the last 168 hours. No results for input(s): LIPASE, AMYLASE in the last 168 hours. No results for input(s): AMMONIA in the last 168 hours.  CBC:  Recent Labs Lab 05/14/15 1604  WBC 6.4  NEUTROABS 4.1  HGB 13.4  HCT 39.0  MCV 83.5  PLT 137*    Cardiac Enzymes:  Recent Labs Lab 05/14/15 1604  TROPONINI <0.03    BNP: Invalid input(s): POCBNP  CBG: No results for input(s): GLUCAP in the last 168 hours.  Microbiology: Results for orders placed or performed during the hospital encounter of 02/05/12  Surgical pcr screen     Status: None   Collection Time: 02/05/12 11:24 AM  Result Value Ref Range Status   MRSA, PCR NEGATIVE NEGATIVE Final   Staphylococcus aureus NEGATIVE NEGATIVE Final    Comment:        The Xpert SA Assay (FDA approved for NASAL specimens in patients over 11 years of age), is one component of a comprehensive surveillance program.  Test performance  has been validated by [employer] for patients greater than or equal to 81 year old. It is not intended to diagnose infection nor to guide or monitor treatment.    Coagulation Studies: No results for input(s): LABPROT, INR in the last 72 hours.  Urinalysis: No results for input(s): COLORURINE, LABSPEC, PHURINE, GLUCOSEU, HGBUR, BILIRUBINUR, KETONESUR, PROTEINUR, UROBILINOGEN, NITRITE, LEUKOCYTESUR in the last 168 hours.  Invalid input(s): APPERANCEUR  Lipid Panel:     Component Value Date/Time   CHOL 210* 05/15/2015 0416   TRIG 147 05/15/2015 0416   HDL 43 05/15/2015 0416   CHOLHDL 4.9 05/15/2015 0416   VLDL 29 05/15/2015 0416   LDLCALC 138* 05/15/2015 0416    HgbA1C:  Lab Results  Component Value Date   HGBA1C 5.2 02/21/2015    Urine Drug Screen:  No  results found for: LABOPIA, COCAINSCRNUR, LABBENZ, AMPHETMU, THCU, LABBARB  Alcohol Level: No results for input(s): ETH in the last 168 hours.  Other results: EKG: normal EKG, normal sinus rhythm, unchanged from previous tracings.  Imaging: Ct Head Wo Contrast  05/15/2015  CLINICAL DATA:  Stroke, hypertension. Right foot and leg numbness beginning yesterday EXAM: CT HEAD WITHOUT CONTRAST TECHNIQUE: Contiguous axial images were obtained from the base of the skull through the vertex without intravenous contrast. COMPARISON:  MRI 05/14/2015 FINDINGS: Subtle low-density area within the left thalamus compatible with the acute infarct seen on prior MRI. No additional acute infarction. No hemorrhage. No hydrocephalus or midline shift. Visualized paranasal sinuses and mastoids clear. Orbital soft tissues unremarkable. IMPRESSION: Small low-density area within the left thalamus compatible with the acute lacunar infarcts seen on prior MRI. No additional acute abnormality. Electronically Signed   By: Rolm Baptise M.D.   On: 05/15/2015 15:50   Mr Jodene Nam Head Wo Contrast  05/14/2015  CLINICAL DATA:  Right leg numbness upon awakening.  Weakness. EXAM: MRI HEAD WITHOUT CONTRAST MRA HEAD WITHOUT CONTRAST TECHNIQUE: Multiplanar, multiecho pulse sequences of the brain and surrounding structures were obtained without intravenous contrast. Angiographic images of the head were obtained using MRA technique without contrast. COMPARISON:  None. 10/10/2011 FINDINGS: MRI HEAD FINDINGS Calvarium and upper cervical spine: No focal marrow signal abnormality. Orbits: Negative. Sinuses and Mastoids: Mucosal thickening on the left, overall mild. No effusion. Brain: Ovoid 1 cm area of restricted diffusion in the lateral left thalamus, extending into the posterior limb internal capsule. No hemorrhagic conversion. No other territory of acute infarct. No evidence of major vessel occlusion. Elsewhere, microvascular ischemic change is  mild/expected for age. No previous infarct. No chronic hemorrhagic foci. MRA HEAD FINDINGS Symmetric carotid and vertebral arteries. Intact circle Willis with small anterior communicating artery. Symmetric vertebrobasilar branching. Symmetric poor signal in distal vertebral and proximal basilar arteries is considered artifactual. These vessels have normal signal on T2 weighted conventional imaging. Atherosclerotic type narrowings: Distal left M1 segment high-grade tandem stenoses. Moderate right proximal M2 segment stenosis. Bilateral M3 narrowing, potentially overestimated due to artifact (poor signal in the bilateral A2 segments appears artifactual). Bilateral atherosclerotic irregularity of the proximal PCA. IMPRESSION: 1. 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 2. No acute arterial finding. 3. Intracranial atherosclerosis as described. No asymmetric disease in the proximal left PCA to correlate with #1. Electronically Signed   By: Monte Fantasia M.D.   On: 05/14/2015 17:45   Mr Brain Wo Contrast  05/14/2015  CLINICAL DATA:  Right leg numbness upon awakening.  Weakness. EXAM: MRI HEAD WITHOUT CONTRAST MRA HEAD WITHOUT CONTRAST TECHNIQUE: Multiplanar, multiecho pulse sequences  of the brain and surrounding structures were obtained without intravenous contrast. Angiographic images of the head were obtained using MRA technique without contrast. COMPARISON:  None. 10/10/2011 FINDINGS: MRI HEAD FINDINGS Calvarium and upper cervical spine: No focal marrow signal abnormality. Orbits: Negative. Sinuses and Mastoids: Mucosal thickening on the left, overall mild. No effusion. Brain: Ovoid 1 cm area of restricted diffusion in the lateral left thalamus, extending into the posterior limb internal capsule. No hemorrhagic conversion. No other territory of acute infarct. No evidence of major vessel occlusion. Elsewhere, microvascular ischemic change is mild/expected for age. No previous infarct. No chronic  hemorrhagic foci. MRA HEAD FINDINGS Symmetric carotid and vertebral arteries. Intact circle Willis with small anterior communicating artery. Symmetric vertebrobasilar branching. Symmetric poor signal in distal vertebral and proximal basilar arteries is considered artifactual. These vessels have normal signal on T2 weighted conventional imaging. Atherosclerotic type narrowings: Distal left M1 segment high-grade tandem stenoses. Moderate right proximal M2 segment stenosis. Bilateral M3 narrowing, potentially overestimated due to artifact (poor signal in the bilateral A2 segments appears artifactual). Bilateral atherosclerotic irregularity of the proximal PCA. IMPRESSION: 1. 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 2. No acute arterial finding. 3. Intracranial atherosclerosis as described. No asymmetric disease in the proximal left PCA to correlate with #1. Electronically Signed   By: Monte Fantasia M.D.   On: 05/14/2015 17:45   US Carotid Bilateral  05/15/2015  CLINICAL DATA:  75 year old female with a history of CVA. Cardiovascular risk factors include hyperlipidemia. EXAM: BILATERAL CAROTID DUPLEX ULTRASOUND TECHNIQUE: Pearline Cables scale imaging, color Doppler and duplex ultrasound were performed of bilateral carotid and vertebral arteries in the neck. COMPARISON:  MR 05/14/2015, no prior duplex FINDINGS: Criteria: Quantification of carotid stenosis is based on velocity parameters that correlate the residual internal carotid diameter with NASCET-based stenosis levels, using the diameter of the distal internal carotid lumen as the denominator for stenosis measurement. The following velocity measurements were obtained: RIGHT ICA:  Systolic [id number] cm/sec, Diastolic 27 cm/sec CCA:  [id number] cm/sec SYSTOLIC ICA/CCA RATIO:  1.0 ECA:  101 cm/sec LEFT ICA:  Systolic 81 cm/sec, Diastolic 23 cm/sec CCA:  [id number] cm/sec SYSTOLIC ICA/CCA RATIO:  0.7 ECA:  96 cm/sec Right Brachial SBP: Not acquired Left Brachial SBP: Not  acquired RIGHT CAROTID ARTERY: Atherosclerotic changes of the right common carotid artery. Intermediate waveform maintained. Heterogeneous and partially calcified plaque at the right carotid bifurcation. Shadowing across the lumen secondary to near field calcifications. Low resistance waveform of the right ICA. No significant tortuosity. RIGHT VERTEBRAL ARTERY: Antegrade flow with low resistance waveform. LEFT CAROTID ARTERY: Atherosclerotic changes of the left common carotid artery. Intermediate waveform maintained. Heterogeneous and partially calcified plaque at the left carotid bifurcation without significant lumen shadowing. Low resistance waveform of the left ICA. No significant tortuosity. LEFT VERTEBRAL ARTERY:  Antegrade flow with low resistance waveform. Right thyroid nodule identified measuring 1.2 cm x 1.1 cm x 1.4 cm, with internal calcium/colloid. IMPRESSION: Color duplex indicates moderate heterogeneous and calcified plaque, with no hemodynamically significant stenosis by duplex criteria in the extracranial cerebrovascular circulation. A right thyroid nodules present measuring as great as 1.4 cm. Findings meet consensus criteria for biopsy, however, in a patient of this age with no thyroid risk factors a surveillance strategy would also be reasonable as thyroid cancers <2cm are known to be indolent with nearly 100% 10-year survival. If a biopsy is desired, would recommend a diagnostic ultrasound survey, and referral for evaluation. - Managing incidental thyroid nodules detected on imaging: white paper  of the ACR Incidental Thyroid Findings Committee. J Am Coll Radiol. 2015 Feb;12(2):143-50. - Management of Thyroid Nodules Detected at [place]: Society of Radiologists in [place]. Radiology 2005; [id number]. Signed, Dulcy Fanny. Earleen Newport, DO [facility] Electronically Signed   By: Corrie Mckusick D.O.   On: 05/15/2015 09:58      Assessment/Plan:  75 y.o. female with a known history of hypertension, GERD, history of hiatal hernia status post repair, esophageal stricture, Zenker's diverticulum, paraesophageal hernia, who presents to the hospital today due to right foot and leg numbness. Patient says she woke up yesterday and felt that her right foot was numb and not acting appropriately. 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  Ultrasound no hemodynamic stenosis. Was on ASA at home.   Stroke work up complete but pt states had a period of dysarthria and increased RLE weakness that has improved.  This is not pressure dependent as there is no large vessel involvement, but small vessel dz as pt has significant atherosclerotic dz.  Strength much improved and close to baseline. Repeat CTH no new abnormalities.  D/c today with home pt Call with questions.      Leotis Pain   05/16/2015, 12:34 PM

## 2015-05-16 NOTE — Discharge Summary (Signed)
[facility] NAME: Stephanie Bell    MR#:  [id number]  DATE OF BIRTH:  09/16/1940  DATE OF ADMISSION:  05/14/2015 ADMITTING PHYSICIAN: Henreitta Leber, MD  DATE OF DISCHARGE: 05/16/2015  3:35 PM  PRIMARY CARE PHYSICIAN: Maryland Pink, MD    ADMISSION DIAGNOSIS:  CVA (cerebral infarction) [I63.9]  DISCHARGE DIAGNOSIS:  Active Problems:   CVA (cerebral infarction)   SECONDARY DIAGNOSIS:   Past Medical History  Diagnosis Date  . Interstitial cystitis   . Hx: UTI (urinary tract infection)   . Weight loss   . Wound disruption, post-op, skin   . Pneumonia 05-09-11    2011 last time  . Paraesophageal hernia   . GERD (gastroesophageal reflux disease)   . H/O hiatal hernia   . Kidney stone   . 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  #1 acute CVA-this was the cause of patient's right foot numbness tingling and mild weakness. She had an MRI of the brain was positive for acute left thalamic lacunar infarct. -Patient was admitted to the hospital and started on Plavix as she had failed aspirin. She was also started on atorvastatin. Her carotid duplex showed no evidence of hemodynamic significant stenosis. Her echocardiogram also showed no evidence of any thrombus. -Patient was seen by neurology who agreed with the management of switching her to Plavix since starting on the high dose intensity statin. She was also seen by physical therapy who recommended home health services which was arranged for her prior to discharge.   #2 essential hypertension-patient was not taking any meds prior to coming to the hospital. She is not being discharged on hydrochlorothiazide as  mentioned and further titrations to antihypertensives can be done as an outpatient. -She had been on lisinopril before but could not tolerate it due to side effects.  #3 history of GERD, paraesophageal hernia repair-she will continue her Zofran as needed.  #4 hyperlipidemia-patient's total cholesterol was over 200. In the setting of an acute CVA she was started on a high dose intensity statin and discharged on atorvastatin.  DISCHARGE CONDITIONS:   Stable  CONSULTS OBTAINED:  Treatment Team:  Leotis Pain, MD  DRUG ALLERGIES:   Allergies  Allergen Reactions  . Epinephrine Hcl Palpitations and Other (See Comments)    Reaction:  Tachycardia   . Sulfa Antibiotics Swelling and Rash    DISCHARGE MEDICATIONS:   Discharge Medication List as of 05/15/2015 12:13 PM    START taking these medications   Details  atorvastatin (LIPITOR) 40 MG tablet Take 1 tablet (40 mg total) by mouth daily at 6 PM., Starting 05/15/2015, Until Discontinued, Print    hydrochlorothiazide (HYDRODIURIL) 25 MG tablet Take 1 tablet (25 mg total) by mouth daily., Starting 05/15/2015, Until Discontinued, Print      CONTINUE these medications which have CHANGED   Details  clopidogrel (PLAVIX) 75 MG tablet Take 1 tablet (75 mg total) by mouth daily., Starting 05/15/2015, Until Discontinued, Print      CONTINUE these medications which have NOT CHANGED   Details  Cholecalciferol (VITAMIN D3) 5000 UNITS CAPS Take 5,000 Units by mouth daily., Until Discontinued, Historical Med    Cranberry-Vitamin C 84-20 MG CAPS Take 2 capsules by mouth daily., Until Discontinued, Historical Med  ibuprofen (ADVIL,MOTRIN) 200 MG tablet Take 200 mg by mouth every 6 (six) hours as needed for headache or mild pain. , Until Discontinued, Historical Med    Multiple Vitamin (MULTIVITAMIN WITH MINERALS) TABS tablet Take 1 tablet by mouth daily., Until Discontinued, Historical Med    ondansetron (ZOFRAN-ODT) 4 MG disintegrating  tablet Take 1 tablet (4 mg total) by mouth every 8 (eight) hours as needed for nausea or vomiting., Starting 11/04/2014, Until Discontinued, Phone In      STOP taking these medications     aspirin EC 81 MG tablet          DISCHARGE INSTRUCTIONS:   DIET:  Cardiac diet  DISCHARGE CONDITION:  Stable  ACTIVITY:  Activity as tolerated  OXYGEN:  Home Oxygen: No.   Oxygen Delivery: room air  DISCHARGE LOCATION:  Home with home health physical therapy   If you experience worsening of your admission symptoms, develop shortness of breath, life threatening emergency, suicidal or homicidal thoughts you must seek medical attention immediately by calling 911 or calling your MD immediately  if symptoms less severe.  You Must read complete instructions/literature along with all the possible adverse reactions/side effects for all the Medicines you take and that have been prescribed to you. Take any new Medicines after you have completely understood and accpet all the possible adverse reactions/side effects.   Please note  You were cared for by a hospitalist during your hospital stay. If you have any questions about your discharge medications or the care you received while you were in the hospital after you are discharged, you can call the unit and asked to speak with the hospitalist on call if the hospitalist that took care of you is not available. Once you are discharged, your primary care physician will handle any further medical issues. Please note that NO REFILLS for any discharge medications will be authorized once you are discharged, as it is imperative that you return to your primary care physician (or establish a relationship with a primary care physician if you do not have one) for your aftercare needs so that they can reassess your need for medications and monitor your lab values.     Today   Right foot numbness and weakness has improved. Husband at bedside. No other complaints  presently.  VITAL SIGNS:  Blood pressure 106/72, pulse 74, temperature 98 F (36.7 C), temperature source Oral, resp. rate 20, height 5\' 6"  (1.676 m), weight 84.959 kg (187 lb 4.8 oz), SpO2 94 %.  I/O:   Intake/Output Summary (Last 24 hours) at 05/16/15 1609 Last data filed at 05/16/15 1300  Gross per 24 hour  Intake    600 ml  Output    325 ml  Net    275 ml    PHYSICAL EXAMINATION:    GENERAL: 75 y.o.-year-old patient lying in the bed with no acute distress.  EYES: Pupils equal, round, reactive to light and accommodation. No scleral icterus. Extraocular muscles intact.  HEENT: Head atraumatic, normocephalic. Oropharynx and nasopharynx clear.  NECK: Supple, no jugular venous distention. No thyroid enlargement, no tenderness.  LUNGS: Normal breath sounds bilaterally, no wheezing, rales, rhonchi. No use of accessory muscles of respiration.  CARDIOVASCULAR: S1, S2 normal. No murmurs, rubs, or gallops.  ABDOMEN: Soft, nontender, nondistended. Bowel sounds present. No organomegaly or mass.  EXTREMITIES: No cyanosis, clubbing or edema b/l.  NEUROLOGIC: Cranial nerves II through XII are intact. Right foot sensory loss. No other focal motor or sensory deficits b/l.  PSYCHIATRIC:  The patient is alert and oriented x 3. Good affect.  SKIN: No obvious rash, lesion, or ulcer.   DATA REVIEW:   CBC  Recent Labs Lab 05/14/15 1604  WBC 6.4  HGB 13.4  HCT 39.0  PLT 137*    Chemistries   Recent Labs Lab 05/14/15 1604  NA 135  K 3.7  CL 103  CO2 25  GLUCOSE 109*  BUN 10  CREATININE 0.92  CALCIUM 8.6*    Cardiac Enzymes  Recent Labs Lab 05/14/15 1604  TROPONINI <0.03    Microbiology Results  Results for orders placed or performed during the hospital encounter of 02/05/12  Surgical pcr screen     Status: None   Collection Time: 02/05/12 11:24 AM  Result Value Ref Range Status   MRSA, PCR NEGATIVE NEGATIVE Final   Staphylococcus aureus NEGATIVE  NEGATIVE Final    Comment:        The Xpert SA Assay (FDA approved for NASAL specimens in patients over 15 years of age), is one component of a comprehensive surveillance program.  Test performance has been validated by [employer] for patients greater than or equal to 71 year old. It is not intended to diagnose infection nor to guide or monitor treatment.    RADIOLOGY:  Ct Head Wo Contrast  05/15/2015  CLINICAL DATA:  Stroke, hypertension. Right foot and leg numbness beginning yesterday EXAM: CT HEAD WITHOUT CONTRAST TECHNIQUE: Contiguous axial images were obtained from the base of the skull through the vertex without intravenous contrast. COMPARISON:  MRI 05/14/2015 FINDINGS: Subtle low-density area within the left thalamus compatible with the acute infarct seen on prior MRI. No additional acute infarction. No hemorrhage. No hydrocephalus or midline shift. Visualized paranasal sinuses and mastoids clear. Orbital soft tissues unremarkable. IMPRESSION: Small low-density area within the left thalamus compatible with the acute lacunar infarcts seen on prior MRI. No additional acute abnormality. Electronically Signed   By: Rolm Baptise M.D.   On: 05/15/2015 15:50   Mr Jodene Nam Head Wo Contrast  05/14/2015  CLINICAL DATA:  Right leg numbness upon awakening.  Weakness. EXAM: MRI HEAD WITHOUT CONTRAST MRA HEAD WITHOUT CONTRAST TECHNIQUE: Multiplanar, multiecho pulse sequences of the brain and surrounding structures were obtained without intravenous contrast. Angiographic images of the head were obtained using MRA technique without contrast. COMPARISON:  None. 10/10/2011 FINDINGS: MRI HEAD FINDINGS Calvarium and upper cervical spine: No focal marrow signal abnormality. Orbits: Negative. Sinuses and Mastoids: Mucosal thickening on the left, overall mild. No effusion. Brain: Ovoid 1 cm area of restricted diffusion in the lateral left thalamus, extending into the posterior limb internal capsule. No hemorrhagic  conversion. No other territory of acute infarct. No evidence of major vessel occlusion. Elsewhere, microvascular ischemic change is mild/expected for age. No previous infarct. No chronic hemorrhagic foci. MRA HEAD FINDINGS Symmetric carotid and vertebral arteries. Intact circle Willis with small anterior communicating artery. Symmetric vertebrobasilar branching. Symmetric poor signal in distal vertebral and proximal basilar arteries is considered artifactual. These vessels have normal signal on T2 weighted conventional imaging. Atherosclerotic type narrowings: Distal left M1 segment high-grade tandem stenoses. Moderate right proximal M2 segment stenosis. Bilateral M3 narrowing, potentially overestimated due to artifact (poor signal in the bilateral A2 segments appears artifactual). Bilateral atherosclerotic irregularity of the proximal PCA. IMPRESSION: 1. 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 2. No acute arterial finding. 3. Intracranial atherosclerosis as described. No asymmetric disease in the proximal left PCA to correlate with #1. Electronically Signed   By: Angelica Chessman  Watts M.D.   On: 05/14/2015 17:45   Mr Brain Wo Contrast  05/14/2015  CLINICAL DATA:  Right leg numbness upon awakening.  Weakness. EXAM: MRI HEAD WITHOUT CONTRAST MRA HEAD WITHOUT CONTRAST TECHNIQUE: Multiplanar, multiecho pulse sequences of the brain and surrounding structures were obtained without intravenous contrast. Angiographic images of the head were obtained using MRA technique without contrast. COMPARISON:  None. 10/10/2011 FINDINGS: MRI HEAD FINDINGS Calvarium and upper cervical spine: No focal marrow signal abnormality. Orbits: Negative. Sinuses and Mastoids: Mucosal thickening on the left, overall mild. No effusion. Brain: Ovoid 1 cm area of restricted diffusion in the lateral left thalamus, extending into the posterior limb internal capsule. No hemorrhagic conversion. No other territory of acute infarct. No  evidence of major vessel occlusion. Elsewhere, microvascular ischemic change is mild/expected for age. No previous infarct. No chronic hemorrhagic foci. MRA HEAD FINDINGS Symmetric carotid and vertebral arteries. Intact circle Willis with small anterior communicating artery. Symmetric vertebrobasilar branching. Symmetric poor signal in distal vertebral and proximal basilar arteries is considered artifactual. These vessels have normal signal on T2 weighted conventional imaging. Atherosclerotic type narrowings: Distal left M1 segment high-grade tandem stenoses. Moderate right proximal M2 segment stenosis. Bilateral M3 narrowing, potentially overestimated due to artifact (poor signal in the bilateral A2 segments appears artifactual). Bilateral atherosclerotic irregularity of the proximal PCA. IMPRESSION: 1. 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 2. No acute arterial finding. 3. Intracranial atherosclerosis as described. No asymmetric disease in the proximal left PCA to correlate with #1. Electronically Signed   By: Monte Fantasia M.D.   On: 05/14/2015 17:45   US Carotid Bilateral  05/15/2015  CLINICAL DATA:  75 year old female with a history of CVA. Cardiovascular risk factors include hyperlipidemia. EXAM: BILATERAL CAROTID DUPLEX ULTRASOUND TECHNIQUE: Pearline Cables scale imaging, color Doppler and duplex ultrasound were performed of bilateral carotid and vertebral arteries in the neck. COMPARISON:  MR 05/14/2015, no prior duplex FINDINGS: Criteria: Quantification of carotid stenosis is based on velocity parameters that correlate the residual internal carotid diameter with NASCET-based stenosis levels, using the diameter of the distal internal carotid lumen as the denominator for stenosis measurement. The following velocity measurements were obtained: RIGHT ICA:  Systolic XX123456 cm/sec, Diastolic 27 cm/sec CCA:  XX123456 cm/sec SYSTOLIC ICA/CCA RATIO:  1.0 ECA:  101 cm/sec LEFT ICA:  Systolic 81 cm/sec,  Diastolic 23 cm/sec CCA:  99991111 cm/sec SYSTOLIC ICA/CCA RATIO:  0.7 ECA:  96 cm/sec Right Brachial SBP: Not acquired Left Brachial SBP: Not acquired RIGHT CAROTID ARTERY: Atherosclerotic changes of the right common carotid artery. Intermediate waveform maintained. Heterogeneous and partially calcified plaque at the right carotid bifurcation. Shadowing across the lumen secondary to near field calcifications. Low resistance waveform of the right ICA. No significant tortuosity. RIGHT VERTEBRAL ARTERY: Antegrade flow with low resistance waveform. LEFT CAROTID ARTERY: Atherosclerotic changes of the left common carotid artery. Intermediate waveform maintained. Heterogeneous and partially calcified plaque at the left carotid bifurcation without significant lumen shadowing. Low resistance waveform of the left ICA. No significant tortuosity. LEFT VERTEBRAL ARTERY:  Antegrade flow with low resistance waveform. Right thyroid nodule identified measuring 1.2 cm x 1.1 cm x 1.4 cm, with internal calcium/colloid. IMPRESSION: Color duplex indicates moderate heterogeneous and calcified plaque, with no hemodynamically significant stenosis by duplex criteria in the extracranial cerebrovascular circulation. A right thyroid nodules present measuring as great as 1.4 cm. Findings meet consensus criteria for biopsy, however, in a patient of this age with no thyroid risk factors a surveillance strategy would also be  reasonable as thyroid cancers <2cm are known to be indolent with nearly 100% 10-year survival. If a biopsy is desired, would recommend a diagnostic ultrasound survey, and referral for evaluation. - Managing incidental thyroid nodules detected on imaging: white paper of the ACR Incidental Thyroid Findings Committee. J Am Coll Radiol. 2015 Feb;12(2):143-50. - Management of Thyroid Nodules Detected at [place]: Society of Radiologists in [place]. Radiology 2005; [id number]. Signed, Dulcy Fanny. Earleen Newport, DO  [facility] Electronically Signed   By: Corrie Mckusick D.O.   On: 05/15/2015 09:58      Management plans discussed with the patient, family and they are in agreement.  CODE STATUS:     Code Status Orders        Start     Ordered   05/14/15 2040  Full code   Continuous     05/14/15 2039    Code Status History    Date Active Date Inactive Code Status Order ID Comments User Context   02/21/2015  4:58 AM 02/22/2015  1:29 PM Full Code [id number]  Ivor Costa, MD ED   02/09/2012  1:49 PM 02/12/2012  5:11 PM Full Code [id number]  Dennison Mascot, RN Inpatient   05/16/2011  4:44 PM 05/18/2011  2:25 PM Full Code [id number]  Marisa Sprinkles, RN Inpatient    Advance Directive Documentation        Most Recent Value   Type of Advance Directive  Healthcare Power of Attorney   Pre-existing out of facility DNR order (yellow form or pink MOST form)     "MOST" Form in Place?        TOTAL TIME TAKING CARE OF THIS PATIENT: 40 minutes.    Henreitta Leber M.D on 05/16/2015 at 4:09 PM  Between 7am to 6pm - Pager - [phone]  After 6pm go to [email or web address] - password [email or web address] [facility]  [facility]  Office  [phone]  CC: Primary care physician; Maryland Pink, MD

## 2015-05-16 NOTE — Care Management Note (Addendum)
Case Management Note  Patient Details  Name: TYEISHA PAUTSCH MRN: [id number] Date of Birth: [birth year]  Subjective/Objective:      Referral for home health PT faxed to [facility]. Call to [place] at [facility] to cancel the [facility] referral after Mrs Baechle talked with a friend who has used [facility] in the past. Now Mrs Mertes wants to use [facility] for her home health PT.  This Probation officer called Wynetta Emery at [facility] and made a home health referral for PT. Mrs Heiskell was advised that someone from [facility] would contact her within 48 hours to schedule an appointment and that Mrs Sluyter can ask any questions she has about home health co-pay or home health services duration to the caller from [place].           Action/Plan:   Expected Discharge Date:                  Expected Discharge Plan:     In-House Referral:     Discharge planning Services     Post Acute Care Choice:    Choice offered to:     DME Arranged:    DME Agency:     HH Arranged:    [place] Agency:     Status of Service:     Medicare Important Message Given:    Date Medicare IM Given:    Medicare IM give by:    Date Additional Medicare IM Given:    Additional Medicare Important Message give by:     If discussed at [place] of Stay Meetings, dates discussed:    Additional Comments:  Deaisa Merida A, RN 05/16/2015, 9:55 AM

## 2015-05-16 NOTE — Progress Notes (Signed)
Physical Therapy Treatment Patient Details Name: Stephanie Bell MRN: [id number] DOB: [birth year] Today's Date: 05/16/2015    History of Present Illness 75 yo female with onset of L lacunar infarct was cleared for carotid involvement, has evolving RUE numbness and sensory losses on RLE with normal strength.  PMHx:  CVA, HTN, back surgery    PT Comments    Pt is getting up to walk with assistance to monitor but with RW is fairly safe.  She has accomplished stairs and should be able to get home with husband.  Reminded her to get therapy outpatient when HHPT done, and discussed the benefits of use of R side.  Will continue PT if she is still here tomorrow.  Follow Up Recommendations  Home health PT     Equipment Recommendations  None recommended by PT    Recommendations for Other Services       Precautions / Restrictions Precautions Precautions: Fall Restrictions Weight Bearing Restrictions: No    Mobility  Bed Mobility Overal bed mobility: Modified Independent Bed Mobility: Supine to Sit;Sit to Supine              Transfers Overall transfer level: Needs assistance Equipment used: Rolling walker (2 wheeled);1 person hand held assist Transfers: Sit to/from [facility] Sit to Stand: Min guard Stand pivot transfers: Min guard       General transfer comment: reminders for safety and hand placement  Ambulation/Gait Ambulation/Gait assistance: Min guard Ambulation Distance (Feet): 300 Feet Assistive device: Rolling walker (2 wheeled) Gait Pattern/deviations: Step-through pattern;Narrow base of support;Decreased weight shift to right;Decreased stride length Gait velocity: reduced Gait velocity interpretation: Below normal speed for age/gender General Gait Details: catches L foot once but recovered without help   Stairs Stairs:  (pt was reminded about sequence)          Wheelchair Mobility    Modified Rankin (Stroke Patients Only)        Balance Overall balance assessment: Needs assistance Sitting-balance support: Feet supported Sitting balance-Leahy Scale: Good   Postural control: Posterior lean Standing balance support: Bilateral upper extremity supported Standing balance-Leahy Scale: Fair Standing balance comment: fair- dynamic support                    Cognition Arousal/Alertness: Awake/alert Behavior During Therapy: WFL for tasks assessed/performed Overall Cognitive Status: Within Functional Limits for tasks assessed                      Exercises      General Comments General comments (skin integrity, edema, etc.): Pt is getting up to walk with PT using a paced progression and then educated her about reocvery from stroke.  Pt reminded to use RUE unless she is concerned about spilling a hot drink      Pertinent Vitals/Pain Pain Assessment: No/denies pain    Home Living                      Prior Function            PT Goals (current goals can now be found in the care plan section) Acute Rehab PT Goals Patient Stated Goal: to be safe in mobility at home Progress towards PT goals: Progressing toward goals    Frequency  7X/week    PT Plan Discharge plan needs to be updated    Co-evaluation             End of Session Equipment Utilized  During Treatment: Gait belt Activity Tolerance: Patient tolerated treatment well;Patient limited by fatigue;Other (comment) Patient left: in bed;with call bell/phone within reach;with family/visitor present     Time: 0915-0945 PT Time Calculation (min) (ACUTE ONLY): 30 min  Charges:  $Gait Training: 8-22 mins $Therapeutic Activity: 8-22 mins                    G Codes:      Ramond Dial 2015-06-05, 10:58 AM  Mee Hives, PT MS Acute Rehab Dept. Number: [facility] [id number] and [place] [phone]

## 2015-05-16 NOTE — Progress Notes (Signed)
Alert and oriented, strength improved in RUE, husband at bedside, on room air, stable vital signs, d/c to home with home health, RX provided to patient, pt reports understanding d/c instructions and has no further questions at this time.

## 2015-05-18 DIAGNOSIS — I10 Essential (primary) hypertension: Secondary | ICD-10-CM | POA: Diagnosis not present

## 2015-05-18 DIAGNOSIS — I69351 Hemiplegia and hemiparesis following cerebral infarction affecting right dominant side: Secondary | ICD-10-CM | POA: Diagnosis not present

## 2015-05-18 DIAGNOSIS — Z9181 History of falling: Secondary | ICD-10-CM | POA: Diagnosis not present

## 2015-05-19 DIAGNOSIS — I10 Essential (primary) hypertension: Secondary | ICD-10-CM | POA: Diagnosis not present

## 2015-05-19 DIAGNOSIS — Z9181 History of falling: Secondary | ICD-10-CM | POA: Diagnosis not present

## 2015-05-19 DIAGNOSIS — I69351 Hemiplegia and hemiparesis following cerebral infarction affecting right dominant side: Secondary | ICD-10-CM | POA: Diagnosis not present

## 2015-05-24 ENCOUNTER — Encounter (HOSPITAL_COMMUNITY): Payer: Self-pay | Admitting: Emergency Medicine

## 2015-05-24 ENCOUNTER — Emergency Department (HOSPITAL_COMMUNITY)
Admission: EM | Admit: 2015-05-24 | Discharge: 2015-05-24 | Disposition: A | Discharge status: Home / Self Care | Payer: PPO | Attending: Emergency Medicine | Admitting: Emergency Medicine

## 2015-05-24 ENCOUNTER — Encounter: Payer: Self-pay | Admitting: Cardiovascular Disease

## 2015-05-24 ENCOUNTER — Ambulatory Visit (INDEPENDENT_AMBULATORY_CARE_PROVIDER_SITE_OTHER): Payer: PPO | Admitting: Cardiovascular Disease

## 2015-05-24 VITALS — BP 110/76 | HR 84 | Ht 66.0 in | Wt 177.4 lb

## 2015-05-24 DIAGNOSIS — R000 Tachycardia, unspecified: Secondary | ICD-10-CM | POA: Diagnosis not present

## 2015-05-24 DIAGNOSIS — Z8673 Personal history of transient ischemic attack (TIA), and cerebral infarction without residual deficits: Secondary | ICD-10-CM | POA: Diagnosis not present

## 2015-05-24 DIAGNOSIS — K5900 Constipation, unspecified: Secondary | ICD-10-CM | POA: Insufficient documentation

## 2015-05-24 HISTORY — DX: Cerebral infarction, unspecified: I63.9

## 2015-05-24 LAB — CBC
HCT: 42.8 % (ref 36.0–46.0)
HEMOGLOBIN: 14.6 g/dL (ref 12.0–15.0)
MCH: 29.1 pg (ref 26.0–34.0)
MCHC: 34.1 g/dL (ref 30.0–36.0)
MCV: 85.3 fL (ref 78.0–100.0)
Platelets: 181 10*3/uL (ref 150–400)
RBC: 5.02 MIL/uL (ref 3.87–5.11)
RDW: 13 % (ref 11.5–15.5)
WBC: 7.3 10*3/uL (ref 4.0–10.5)

## 2015-05-24 LAB — COMPREHENSIVE METABOLIC PANEL
ALBUMIN: 4.4 g/dL (ref 3.5–5.0)
ALK PHOS: 75 U/L (ref 38–126)
ALT: 15 U/L (ref 14–54)
ANION GAP: 10 (ref 5–15)
AST: 20 U/L (ref 15–41)
BUN: 13 mg/dL (ref 6–20)
CALCIUM: 9.3 mg/dL (ref 8.9–10.3)
CHLORIDE: 98 mmol/L — AB (ref 101–111)
CO2: 31 mmol/L (ref 22–32)
Creatinine, Ser: 1.02 mg/dL — ABNORMAL HIGH (ref 0.44–1.00)
GFR calc non Af Amer: 52 mL/min — ABNORMAL LOW (ref >60)
GLUCOSE: 111 mg/dL — AB (ref 65–99)
Potassium: 3.3 mmol/L — ABNORMAL LOW (ref 3.5–5.1)
SODIUM: 139 mmol/L (ref 135–145)
Total Bilirubin: 1 mg/dL (ref 0.3–1.2)
Total Protein: 7.3 g/dL (ref 6.5–8.1)

## 2015-05-24 NOTE — Patient Instructions (Addendum)
[facility] - for follow up of your CVA   Medication Instructions:  Your physician recommends that you continue on your current medications as directed. Please refer to the Current Medication list given to you today.   Labwork: None Ordered   Testing/Procedures: Your physician has recommended that you wear an event monitor. Event monitors are medical devices that record the heart's electrical activity. Doctors most often [place] these monitors to diagnose arrhythmias. Arrhythmias are problems with the speed or rhythm of the heartbeat. The monitor is a small, portable device. You can wear one while you do your normal daily activities. This is usually used to diagnose what is causing palpitations/syncope (passing out).   Follow-Up: Your physician recommends that you schedule a follow-up appointment in: 3 months with Dr. Acie Fredrickson   If you need a refill on your cardiac medications before your next appointment, please call your pharmacy.   Thank you for choosing [facility]! Christen Bame, RN [phone]

## 2015-05-24 NOTE — Progress Notes (Signed)
Cardiology Office Note   Date:  05/24/2015   ID:  [name], [name] 1940-11-24, MRN [id number]  PCP:  Stephanie Pink, MD  Cardiologist:   Thayer Headings, MD   Chief Complaint  Patient presents with  . Follow-up   Problem List 1. Tachycardia - ? Atrial fib  2. Troponin leak - following dental procedure with Lidocaine with Epi   History of Present Illness: Stephanie Bell is a 75 y.o. female who presents for evaluation of a mild Troponin leak following a dental procedure with Lidocaine with Epi.  On Oct. 3 - had a dental procedure Plays [employer] regularly .  Plays without any complications.  Played this past week for several hours without difficulty .   Very active, no CP or dyspnea Eats a reasonable diet.    May 21, 2015: Stephanie Bell has had a CVA Feb. 24, 2017.  Developed numbness and weakness in her right leg.    Saw a nurse at [facility] clinic.   Was transferred to the ER at [facility].  She was found have a small stroke in the left side of her brain. (confirmed with MRI) Did not have a TEE  Was started on Plavix, Atorvastatin.    Was started on Lisinopril - developed a cough and some other complications.  Does not think that she had anaphylaxis.  It was stopped and she is doing       Past Medical History  Diagnosis Date  . Interstitial cystitis   . Hx: UTI (urinary tract infection)   . Weight loss   . Wound disruption, post-op, skin   . Pneumonia 05-09-11    2011 last time  . Paraesophageal hernia   . GERD (gastroesophageal reflux disease)   . H/O hiatal hernia   . Kidney stone   . 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 Esophageal stricture 11/04/2014    Past Surgical History  Procedure Laterality Date  . Vaginal hysterectomy    . Septoplasty    . Back surgery  05-09-11    x 2 lumbar  . Cholecystectomy  05-09-11    laparoscopic  . Tonsillectomy  05-09-11    Tonsillectomy  . Kidney stone surgery  05-09-11    past hx. and  cystoscopy for bladder issues  . Laparoscopic nissen fundoplication  0000000    Procedure: LAPAROSCOPIC NISSEN FUNDOPLICATION;  Surgeon: Pedro Earls, MD;  Location: [facility];  Service: General;  Laterality: N/A;  will also remove sebaceous cyst of chest wall  . Irrigation and debridement sebaceous cyst  05/16/2011    Procedure: IRRIGATION AND DEBRIDEMENT SEBACEOUS CYST;  Surgeon: Pedro Earls, MD;  [facility]: [facility];  Service: General;  Laterality: N/A;  middle of chest on sternum  . Umbilical hernia repair  05/16/2011    Procedure: HERNIA REPAIR UMBILICAL ADULT;  Surgeon: Pedro Earls, MD;  [facility];  Service: General;  Laterality: N/A;  with mesh   . Hernia repair  05/16/2011    hiatal hernia  . Colonoscopy  2002, 2008     rectal hyperplastic polyp 2002, diverticulosis 2008  . Laparoscopic nissen fundoplication  123456    Procedure: LAPAROSCOPIC NISSEN FUNDOPLICATION;  Surgeon: Pedro Earls, MD;  [facility];  Service: General;  Laterality: N/A;  Re-do Laparoscopic Nissen   . Hiatal hernia repair  02/09/2012    Procedure: LAPAROSCOPIC REPAIR OF HIATAL HERNIA;  Surgeon: Pedro Earls, MD;  [facility];  Service: General;  Laterality: N/A;  . Insertion of mesh  02/09/2012    Procedure: INSERTION OF MESH;  Surgeon: Pedro Earls, MD;  [facility];  Service: General;;  . Esophagogastroduodenoscopy       Current Outpatient Prescriptions  Medication Sig Dispense Refill  . atorvastatin (LIPITOR) 40 MG tablet Take 1 tablet (40 mg total) by mouth daily at 6 PM. 30 tablet 1  . Cholecalciferol (VITAMIN D3) 5000 UNITS CAPS Take 5,000 Units by mouth daily.    . clopidogrel (PLAVIX) 75 MG tablet Take 1 tablet (75 mg total) by mouth daily. 60 tablet 1  . Cranberry-Vitamin C 84-20 MG CAPS Take 2 capsules by mouth daily.    . hydrochlorothiazide (HYDRODIURIL) 25 MG tablet Take 1 tablet (25 mg total) by mouth daily. 30 tablet 1  . ibuprofen (ADVIL,MOTRIN) 200  MG tablet Take 200 mg by mouth every 6 (six) hours as needed for headache or mild pain.     . Multiple Vitamin (MULTIVITAMIN WITH MINERALS) TABS tablet Take 1 tablet by mouth daily.    . ondansetron (ZOFRAN-ODT) 4 MG disintegrating tablet Take 1 tablet (4 mg total) by mouth every 8 (eight) hours as needed for nausea or vomiting. 30 tablet 11   No current facility-administered medications for this visit.    Allergies:   Benzocaine; Epinephrine hcl; and Sulfa antibiotics    Social History:  The patient  reports that she has never smoked. She has never used smokeless tobacco. She reports that she drinks about 0.6 oz of alcohol per week. She reports that she does not use illicit drugs.   Family History:  The patient's family history includes Alcohol abuse in her father; Alzheimer's disease in her mother; Ulcers in her father and paternal grandmother. There is no history of Colon cancer.    ROS:  Please see the history of present illness.    Review of Systems: Constitutional:  denies fever, chills, diaphoresis, appetite change and fatigue.  HEENT: denies photophobia, eye pain, redness, hearing loss, ear pain, congestion, sore throat, rhinorrhea, sneezing, neck pain, neck stiffness and tinnitus.  Respiratory: denies SOB, DOE, cough, chest tightness, and wheezing.  Cardiovascular: denies chest pain, palpitations and leg swelling.  Gastrointestinal: denies nausea, vomiting, abdominal pain, diarrhea, constipation, blood in stool.  Genitourinary: denies dysuria, urgency, frequency, hematuria, flank pain and difficulty urinating.  Musculoskeletal: denies  myalgias, back pain, joint swelling, arthralgias and gait problem.   Skin: denies pallor, rash and wound.  Neurological: denies dizziness, seizures, syncope, weakness, light-headedness, numbness and headaches.   Hematological: denies adenopathy, easy bruising, personal or family bleeding history.  Psychiatric/ Behavioral: denies suicidal  ideation, mood changes, confusion, nervousness, sleep disturbance and agitation.       All other systems are reviewed and negative.    PHYSICAL EXAM: VS:  BP 110/76 mmHg  Pulse 84  Ht 5\' 6"  (1.676 m)  Wt 177 lb 6.4 oz (80.468 kg)  BMI 28.65 kg/m2 , BMI Body mass index is 28.65 kg/(m^2). GEN: Well nourished, well developed, in no acute distress HEENT: normal Neck: no JVD, carotid bruits, or masses Cardiac: RRR; no murmurs, rubs, or gallops,no edema  Respiratory:  clear to auscultation bilaterally, normal work of breathing GI: soft, nontender, nondistended, + BS MS: no deformity or atrophy Skin: warm and dry, no rash Neuro:  Strength and sensation are intact Psych: normal  EKG:  EKG is not ordered today.  Recent Labs: 02/21/2015: ALT 14; B Natriuretic Peptide 92.8 05/14/2015: BUN 10; Creatinine, Ser 0.92; Hemoglobin 13.4;  Platelets 137*; Potassium 3.7; Sodium 135    Lipid Panel    Component Value Date/Time   CHOL 210* 05/15/2015 0416   TRIG 147 05/15/2015 0416   HDL 43 05/15/2015 0416   CHOLHDL 4.9 05/15/2015 0416   VLDL 29 05/15/2015 0416   LDLCALC 138* 05/15/2015 0416      Wt Readings from Last 3 Encounters:  05/24/15 177 lb 6.4 oz (80.468 kg)  05/14/15 187 lb 4.8 oz (84.959 kg)  03/11/15 180 lb (81.647 kg)      Other studies Reviewed: Additional studies/ records that were reviewed today include: . Review of the above records demonstrates:    ASSESSMENT AND PLAN:  1.  Troponin elevation:  Stephanie Bell had an acute reaction to lidocaine with epinephrine. She developed tachycardia as well as probable coronary spasm. She had a mild troponin elevation.  She's been a full recovery and has not had any further episodes of chest discomfort. Her Myoview study was essentially normal. Her echocardiogram was also essentially normal. She does have some mild diastolic dysfunction. She plays competitive tennis at least once or twice week. She's not had any previous episodes of  chest discomfort.   2.  Tachycardia: Stephanie Bell originally saw me last fall with a history of tachycardia that occurred after she received lidocaine with epinephrine. We initially thought that this was SVT. In light of her recent stroke, I suspect that she had actually had atrial fibrillation. She's currently on Plavix. We've not seen atrial fibrillation so we we'll need to place a 30 day event monitor. I also think that she needs a transesophageal echo. We may need to consider starting her on Atrial Fib  or Xarelto in place of Plavix.  3. CVA - will look for evidence of A-fib Carotids looks good  - mild - moderate plaque She is concerned about a TEE ( because she has esophageal strictures )  Will do a 30 day event monitor  Will hold off on the TEE for now and will see what the 30 day monitor shows   Current medicines are reviewed at length with the patient today.  The patient does not have concerns regarding medicines.  The following changes have been made:  no change  Labs/ tests ordered today include:  No orders of the defined types were placed in this encounter.     Disposition:   FU with me as needed.      Nahser, Wonda Cheng, MD  05/24/2015 11:01 AM    [facility] [place], [place], [place]  [address] Phone: [phone]; Fax: [phone]   [facility]  [address] [place], [place]  [address] [phone]   Fax [phone]

## 2015-05-24 NOTE — ED Notes (Signed)
Pt states that she had a stroke on feb 24.  States she was started on new medicine that she believes has made her constipated.  States that she has been trying to use laxatives and enemas with no relief.  Last BM was a week and a half ago.  Denies vomiting.

## 2015-05-25 DIAGNOSIS — I69351 Hemiplegia and hemiparesis following cerebral infarction affecting right dominant side: Secondary | ICD-10-CM | POA: Diagnosis not present

## 2015-05-25 DIAGNOSIS — Z8673 Personal history of transient ischemic attack (TIA), and cerebral infarction without residual deficits: Secondary | ICD-10-CM | POA: Diagnosis not present

## 2015-05-25 DIAGNOSIS — Z9181 History of falling: Secondary | ICD-10-CM | POA: Diagnosis not present

## 2015-05-25 DIAGNOSIS — I10 Essential (primary) hypertension: Secondary | ICD-10-CM | POA: Diagnosis not present

## 2015-05-25 DIAGNOSIS — E785 Hyperlipidemia, unspecified: Secondary | ICD-10-CM | POA: Diagnosis not present

## 2015-05-26 ENCOUNTER — Other Ambulatory Visit: Payer: Self-pay | Admitting: Cardiovascular Disease

## 2015-05-26 DIAGNOSIS — R000 Tachycardia, unspecified: Secondary | ICD-10-CM

## 2015-05-26 DIAGNOSIS — Z8673 Personal history of transient ischemic attack (TIA), and cerebral infarction without residual deficits: Secondary | ICD-10-CM

## 2015-05-26 DIAGNOSIS — I4891 Unspecified atrial fibrillation: Secondary | ICD-10-CM

## 2015-05-27 ENCOUNTER — Ambulatory Visit (INDEPENDENT_AMBULATORY_CARE_PROVIDER_SITE_OTHER): Payer: PPO

## 2015-05-27 DIAGNOSIS — Z8673 Personal history of transient ischemic attack (TIA), and cerebral infarction without residual deficits: Secondary | ICD-10-CM | POA: Diagnosis not present

## 2015-05-27 DIAGNOSIS — R000 Tachycardia, unspecified: Secondary | ICD-10-CM | POA: Diagnosis not present

## 2015-05-27 DIAGNOSIS — I4891 Unspecified atrial fibrillation: Secondary | ICD-10-CM

## 2015-05-27 DIAGNOSIS — I639 Cerebral infarction, unspecified: Secondary | ICD-10-CM | POA: Diagnosis not present

## 2015-06-01 ENCOUNTER — Ambulatory Visit (INDEPENDENT_AMBULATORY_CARE_PROVIDER_SITE_OTHER): Payer: PPO | Admitting: Neurology

## 2015-06-01 ENCOUNTER — Encounter: Payer: Self-pay | Admitting: Neurology

## 2015-06-01 VITALS — BP 110/67 | HR 74 | Ht 66.0 in | Wt 180.8 lb

## 2015-06-01 DIAGNOSIS — Z9181 History of falling: Secondary | ICD-10-CM | POA: Diagnosis not present

## 2015-06-01 DIAGNOSIS — I63212 Cerebral infarction due to unspecified occlusion or stenosis of left vertebral arteries: Secondary | ICD-10-CM

## 2015-06-01 DIAGNOSIS — I63219 Cerebral infarction due to unspecified occlusion or stenosis of unspecified vertebral arteries: Secondary | ICD-10-CM | POA: Insufficient documentation

## 2015-06-01 DIAGNOSIS — I69351 Hemiplegia and hemiparesis following cerebral infarction affecting right dominant side: Secondary | ICD-10-CM | POA: Diagnosis not present

## 2015-06-01 DIAGNOSIS — I6322 Cerebral infarction due to unspecified occlusion or stenosis of basilar arteries: Secondary | ICD-10-CM

## 2015-06-01 DIAGNOSIS — I10 Essential (primary) hypertension: Secondary | ICD-10-CM | POA: Diagnosis not present

## 2015-06-01 DIAGNOSIS — I6381 Other cerebral infarction due to occlusion or stenosis of small artery: Secondary | ICD-10-CM

## 2015-06-01 NOTE — Progress Notes (Addendum)
[facility]    Provider:  Dr Jaynee Eagles Referring Provider: [place] Pink, MD Primary Care Physician:  [place] Pink, MD  CC:  Thalamic stroke  HPI:  Stephanie Bell is a lovely 75 y.o. female here as a referral from Dr. Kary Kos for thalamic stroke. She has a past medical history of hypertension, hyperlipidemia, no diabetes. She presented to the hospital on February 24 after waking up with right foot and leg numbness. She also reported weakness. No other neurologic deficits. No aphasia, no dysphagia, no dysarthria, no other weakness or numbness, no altered mentation. Her husband gave her to aspirin may went to the emergency room. She had an MRI of the brain which was positive for stroke.  Sheis feeling much better. Hisband here and provides information as well. No more numbness and no more weakness. She feels scared that she had a stroke, doesn't understand why or where in the brain she had it. Never had a stroke before. There were no inciting events, she just woke up. No FHx of stroke.  Reviewed notes, labs and imaging from outside physicians, which showed:  ldl 138. hgba1c wnl  Personally reviewed images with patient and her husband and agree with the following:  MRI HEAD FINDINGS  Calvarium and upper cervical spine: No focal marrow signal abnormality.  Orbits: Negative.  Sinuses and Mastoids: Mucosal thickening on the left, overall mild. No effusion.  Brain: Ovoid 1 cm area of restricted diffusion in the lateral left thalamus, extending into the posterior limb internal capsule. No hemorrhagic conversion. No other territory of acute infarct. No evidence of major vessel occlusion.  Elsewhere, microvascular ischemic change is mild/expected for age. No previous infarct. No chronic hemorrhagic foci.  MRA HEAD FINDINGS  Symmetric carotid and vertebral arteries. Intact circle Willis with small anterior communicating artery.  Symmetric vertebrobasilar  branching. Symmetric poor signal in distal vertebral and proximal basilar arteries is considered artifactual. These vessels have normal signal on T2 weighted conventional imaging.  Atherosclerotic type narrowings:  Distal left M1 segment high-grade tandem stenoses.  Moderate right proximal M2 segment stenosis.  Bilateral M3 narrowing, potentially overestimated due to artifact (poor signal in the bilateral A2 segments appears artifactual).  Bilateral atherosclerotic irregularity of the proximal PCA.   IMPRESSION: 1. 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 2. No acute arterial finding. 3. Intracranial atherosclerosis as described. No asymmetric disease in the proximal left PCA to correlate with #1.  Ultrasound carotids with no hemodynamically significant stenosis.  Review of Systems: Patient complains of symptoms per HPI as well as the following symptoms: Constipation. Pertinent negatives per HPI. All others negative.   Social History   Social History  . Marital Status: Married    Spouse Name: [name]  . Number of Children: 3  . Years of Education: College   Occupational History  . Not on file.   Social History Main Topics  . Smoking status: Never Smoker   . Smokeless tobacco: Never Used  . Alcohol Use: 0.6 oz/week    1 Glasses of wine per week     Comment: Daily 4oz  . Drug Use: No  . Sexual Activity: Yes   Other Topics Concern  . Not on file   Social History Narrative   Lives with husband.   Caffeine use: none    Family History  Problem Relation Age of Onset  . Colon cancer Neg Hx   . Stroke Neg Hx   . Ulcers Paternal Grandmother   . Ulcers Father   .  Alcohol abuse Father   . Alzheimer's disease Mother     Past Medical History  Diagnosis Date  . Interstitial cystitis   . Hx: UTI (urinary tract infection)   . Weight loss   . Wound disruption, post-op, skin   . Pneumonia 05-09-11    2011 last time  . Paraesophageal hernia     . GERD (gastroesophageal reflux disease)   . H/O hiatal hernia   . Kidney stone   . 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 Esophageal stricture 11/04/2014  . Stroke ([place])   . ADD (attention deficit disorder)     Past Surgical History  Procedure Laterality Date  . Vaginal hysterectomy    . Septoplasty    . Back surgery  05-09-11    x 2 lumbar  . Cholecystectomy  05-09-11    laparoscopic  . Tonsillectomy  05-09-11    Tonsillectomy  . Kidney stone surgery  05-09-11    past hx. and cystoscopy for bladder issues  . Laparoscopic nissen fundoplication  0000000    Procedure: LAPAROSCOPIC NISSEN FUNDOPLICATION;  Surgeon: Pedro Earls, MD;  [facility];  Service: General;  Laterality: N/A;  will also remove sebaceous cyst of chest wall  . Irrigation and debridement sebaceous cyst  05/16/2011    Procedure: IRRIGATION AND DEBRIDEMENT SEBACEOUS CYST;  Surgeon: Pedro Earls, MD;  [facility];  Service: General;  Laterality: N/A;  middle of chest on sternum  . Umbilical hernia repair  05/16/2011    Procedure: HERNIA REPAIR UMBILICAL ADULT;  Surgeon: Pedro Earls, MD;  [facility];  Service: General;  Laterality: N/A;  with mesh   . Hernia repair  05/16/2011    hiatal hernia  . Colonoscopy  2002, 2008     rectal hyperplastic polyp 2002, diverticulosis 2008  . Laparoscopic nissen fundoplication  123456    Procedure: LAPAROSCOPIC NISSEN FUNDOPLICATION;  Surgeon: Pedro Earls, MD;  [facility];  Service: General;  Laterality: N/A;  Re-do Laparoscopic Nissen   . Hiatal hernia repair  02/09/2012    Procedure: LAPAROSCOPIC REPAIR OF HIATAL HERNIA;  Surgeon: Pedro Earls, MD;  [facility];  Service: General;  Laterality: N/A;  . Insertion of mesh  02/09/2012    Procedure: INSERTION OF MESH;  Surgeon: Pedro Earls, MD;  [facility];  Service: General;;  . Esophagogastroduodenoscopy      Current Outpatient Prescriptions  Medication Sig  Dispense Refill  . atorvastatin (LIPITOR) 40 MG tablet Take 1 tablet (40 mg total) by mouth daily at 6 PM. 30 tablet 1  . Cholecalciferol (VITAMIN D3) 5000 UNITS CAPS Take 5,000 Units by mouth daily.    . clopidogrel (PLAVIX) 75 MG tablet Take 1 tablet (75 mg total) by mouth daily. 60 tablet 1  . Cranberry-Vitamin C 84-20 MG CAPS Take 2 capsules by mouth daily.    . hydrochlorothiazide (HYDRODIURIL) 25 MG tablet Take 1 tablet (25 mg total) by mouth daily. 30 tablet 1  . ibuprofen (ADVIL,MOTRIN) 200 MG tablet Take 200 mg by mouth every 6 (six) hours as needed for headache or mild pain.     . Multiple Vitamin (MULTIVITAMIN WITH MINERALS) TABS tablet Take 1 tablet by mouth daily.    . ondansetron (ZOFRAN-ODT) 4 MG disintegrating tablet Take 1 tablet (4 mg total) by mouth every 8 (eight) hours as needed for nausea or vomiting. 30 tablet 11   No current facility-administered medications for this visit.    Allergies as of  06/01/2015 - Review Complete 06/01/2015  Allergen Reaction Noted  . Benzocaine Palpitations 05/24/2015  . Epinephrine hcl Palpitations and Other (See Comments) 02/21/2015  . Sulfa antibiotics Swelling and Rash 02/22/2011    Vitals: BP 110/67 mmHg  Pulse 74  Ht 5\' 6"  (1.676 m)  Wt 180 lb 12.8 oz (82.01 kg)  BMI 29.20 kg/m2 Last Weight:  Wt Readings from Last 1 Encounters:  06/01/15 180 lb 12.8 oz (82.01 kg)   Last Height:   Ht Readings from Last 1 Encounters:  06/01/15 5\' 6"  (1.676 m)   Physical exam: Exam: Gen: NAD, conversant, well nourised, overweight, well groomed                     CV: RRR, no MRG. No Carotid Bruits. No peripheral edema, warm, nontender Eyes: Conjunctivae clear without exudates or hemorrhage  Neuro: Detailed Neurologic Exam  Speech:    Speech is normal; fluent and spontaneous with normal comprehension.  Cognition:    The patient is oriented to person, place, and time;     recent and remote memory intact;     language fluent;      normal attention, concentration,     fund of knowledge Cranial Nerves:    The pupils are equal, round, and reactive to light. The fundi are normal and spontaneous venous pulsations are present. Visual fields are full to finger confrontation. Extraocular movements are intact. Trigeminal sensation is intact and the muscles of mastication are normal. The face is symmetric. The palate elevates in the midline. Hearing intact. Voice is normal. Shoulder shrug is normal. The tongue has normal motion without fasciculations.   Coordination:    Normal finger to nose and heel to shin. Normal rapid alternating movements.   Gait:    Walks with a cane, cautious but without ataxia. Motor Observation:    No asymmetry, no atrophy, and no involuntary movements noted. Tone:    Normal muscle tone.    Posture:    Posture is normal. normal erect    Strength:    Strength is V/V in the upper and lower limbs.      Sensation: intact to LT     Reflex Exam:  DTR's:    Deep tendon reflexes in the upper and lower extremities are normal bilaterally.   Toes:    The toes are downgoing bilaterally.   Clonus:    Clonus is absent.      Assessment/Plan:  This is a lovely 75 year old female with hypertension and hyperlipidemia who presented to [facility]2015 with a nonhemorrhagic lacunar infarct in the left thalamus due to small vessel disease. She was on aspirin at home. ldl 138. hgba1c wnl. MRA did show significant atherosclerotic disease. Discussed with patient and husband and reviewed images with him.  I had a long d/w patient and husband about her recent stroke, risk for recurrent stroke/TIAs, personally independently reviewed imaging studies and stroke evaluation results and answered questions.Continue Plavix and Lipitor for secondary stroke prevention and maintain strict control of hypertension with blood pressure goal below 130/90, diabetes with hemoglobin A1c goal below 6.5% and lipids with  LDL cholesterol goal below 70 mg/dL.Patient  I also advised the patient to eat a healthy diet with plenty of whole grains, cereals, fruits and vegetables, exercise regularly and maintain ideal body weight .Followup in the future with me if necessary.  Sarina Ill, MD  [facility] [address] [place], [place] [address]  Phone [phone] Fax [phone]

## 2015-06-01 NOTE — Patient Instructions (Signed)

## 2015-06-02 ENCOUNTER — Telehealth: Payer: Self-pay | Admitting: Neurology

## 2015-06-02 NOTE — Telephone Encounter (Signed)
Patient is calling. She states she would like Dr. Cathren Laine notes sent to Dr. Nyra Capes. She would like to discuss this with you.

## 2015-06-02 NOTE — Telephone Encounter (Signed)
Dr Jaynee Eagles- try forwarding to Dr Acie Fredrickson*  Called pt back. It is Dr. Acie Fredrickson. Name was wrong. Advised I will let Dr Jaynee Eagles know. She said she didn't need to speak to Dr Jaynee Eagles.

## 2015-06-02 NOTE — Telephone Encounter (Signed)
Tried to forward, not in the [employer] [facility]. You can have her provide the full name and location and I can fax.

## 2015-06-17 ENCOUNTER — Telehealth: Payer: Self-pay | Admitting: Cardiovascular Disease

## 2015-06-17 NOTE — Telephone Encounter (Signed)
Spoke with patient who states she has taken off her 30 day monitor due to it interferes with her swimming for exercise, her social life, sex life, and the pads are irritating her skin despite changing them 3 times.  She states she saw the neurologist recently and was told that if she continues to control her cholesterol and high blood pressure that she will not have another stroke.  I advised her that Dr. Acie Fredrickson will review the 2 weeks worth of data from the monitor and we will call her with the results.  She verbalized understanding and agreement.

## 2015-06-17 NOTE — Telephone Encounter (Signed)
New message     Patient calling going off the heart monitor wants to discuss with nurse.

## 2015-06-17 NOTE — Telephone Encounter (Signed)
Will review the monitor results and see what she has so for

## 2015-07-13 DIAGNOSIS — I679 Cerebrovascular disease, unspecified: Secondary | ICD-10-CM | POA: Diagnosis not present

## 2015-07-13 DIAGNOSIS — R42 Dizziness and giddiness: Secondary | ICD-10-CM | POA: Diagnosis not present

## 2015-07-13 DIAGNOSIS — E785 Hyperlipidemia, unspecified: Secondary | ICD-10-CM | POA: Diagnosis not present

## 2015-07-20 DIAGNOSIS — D649 Anemia, unspecified: Secondary | ICD-10-CM | POA: Diagnosis not present

## 2015-08-04 DIAGNOSIS — E785 Hyperlipidemia, unspecified: Secondary | ICD-10-CM | POA: Diagnosis not present

## 2015-08-26 ENCOUNTER — Ambulatory Visit (INDEPENDENT_AMBULATORY_CARE_PROVIDER_SITE_OTHER): Payer: PPO | Admitting: Cardiovascular Disease

## 2015-08-26 ENCOUNTER — Encounter: Payer: Self-pay | Admitting: Cardiovascular Disease

## 2015-08-26 VITALS — BP 110/68 | HR 68 | Ht 66.0 in | Wt 181.6 lb

## 2015-08-26 DIAGNOSIS — R778 Other specified abnormalities of plasma proteins: Secondary | ICD-10-CM

## 2015-08-26 DIAGNOSIS — R7989 Other specified abnormal findings of blood chemistry: Secondary | ICD-10-CM | POA: Diagnosis not present

## 2015-08-26 NOTE — Patient Instructions (Signed)
Medication Instructions:  Your physician recommends that you continue on your current medications as directed. Please refer to the Current Medication list given to you today.   Labwork: None Ordered   Testing/Procedures: None Ordered   Follow-Up: Your physician wants you to follow-up in: 1 year with Dr. Nahser.  You will receive a reminder letter in the mail two months in advance. If you don't receive a letter, please call our office to schedule the follow-up appointment.   If you need a refill on your cardiac medications before your next appointment, please call your pharmacy.   Thank you for choosing [facility]! Kaia Depaolis, RN [phone]    

## 2015-08-26 NOTE — Progress Notes (Signed)
Cardiology Office Note   Date:  08/26/2015   ID:  Stephanie Bell, Stephanie Bell [birth year], MRN [id number]  PCP:  Maryland Pink, MD  Cardiologist:   Mertie Moores, MD   No chief complaint on file.  Problem List 1. Tachycardia - ? Atrial fib  2. Troponin leak - following dental procedure with Lidocaine with Epi   History of Present Illness: Stephanie Bell is a 75 y.o. female who presents for evaluation of a mild Troponin leak following a dental procedure with Lidocaine with Epi.  On Oct. 3 - had a dental procedure Plays [employer] and [employer] regularly .  Plays without any complications.  Played this past week for several hours without difficulty .   Very active, no CP or dyspnea Eats a reasonable diet.    May 21, 2015: Stephanie Bell has had a CVA Feb. 24, 2017.  Developed numbness and weakness in her right leg.    Saw a nurse at [facility] clinic.   Was transferred to the ER at [facility].  She was found have a small stroke in the left side of her brain. (confirmed with MRI) Did not have a TEE  Was started on Plavix, Atorvastatin.    Was started on Lisinopril - developed a cough and some other complications.  Does not think that she had anaphylaxis.  It was stopped and she is doing well now  August 26, 2015 She wore the monitor for 2 weeks.   No evidence of atrial fib. She is confident that she does not have atrial fib.  She had held her plavix and ASA for a while and think        Past Medical History  Diagnosis Date  . Interstitial cystitis   . Hx: UTI (urinary tract infection)   . Weight loss   . Wound disruption, post-op, skin   . Pneumonia 05-09-11    2011 last time  . Paraesophageal hernia   . GERD (gastroesophageal reflux disease)   . H/O hiatal hernia   . Kidney stone   . 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 Esophageal stricture 11/04/2014  . Stroke ([place])   . ADD (attention deficit disorder)     Past Surgical History  Procedure Laterality Date  .  Vaginal hysterectomy    . Septoplasty    . Back surgery  05-09-11    x 2 lumbar  . Cholecystectomy  05-09-11    laparoscopic  . Tonsillectomy  05-09-11    Tonsillectomy  . Kidney stone surgery  05-09-11    past hx. and cystoscopy for bladder issues  . Laparoscopic nissen fundoplication  0000000    Procedure: LAPAROSCOPIC NISSEN FUNDOPLICATION;  Surgeon: Pedro Earls, MD;  Location: [facility];  Service: General;  Laterality: N/A;  will also remove sebaceous cyst of chest wall  . Irrigation and debridement sebaceous cyst  05/16/2011    Procedure: IRRIGATION AND DEBRIDEMENT SEBACEOUS CYST;  Surgeon: Pedro Earls, MD;  Location: [facility];  Service: General;  Laterality: N/A;  middle of chest on sternum  . Umbilical hernia repair  05/16/2011    Procedure: HERNIA REPAIR UMBILICAL ADULT;  Surgeon: Pedro Earls, MD;  Location: [facility];  Service: General;  Laterality: N/A;  with mesh   . Hernia repair  05/16/2011    hiatal hernia  . Colonoscopy  2002, 2008     rectal hyperplastic polyp 2002, diverticulosis 2008  . Laparoscopic nissen fundoplication  [id number]    Procedure: LAPAROSCOPIC NISSEN FUNDOPLICATION;  Surgeon: Pedro Earls, MD;  [facility];  Service: General;  Laterality: N/A;  Re-do Laparoscopic Nissen   . Hiatal hernia repair  02/09/2012    Procedure: LAPAROSCOPIC REPAIR OF HIATAL HERNIA;  Surgeon: Pedro Earls, MD;  [facility];  Service: General;  Laterality: N/A;  . Insertion of mesh  02/09/2012    Procedure: INSERTION OF MESH;  Surgeon: Pedro Earls, MD;  [facility];  Service: General;;  . Esophagogastroduodenoscopy       Current Outpatient Prescriptions  Medication Sig Dispense Refill  . atorvastatin (LIPITOR) 40 MG tablet Take 1 tablet (40 mg total) by mouth daily at 6 PM. 30 tablet 1  . Cholecalciferol (VITAMIN D3) 5000 UNITS CAPS Take 5,000 Units by mouth daily.    . clopidogrel (PLAVIX) 75 MG tablet Take 1 tablet (75 mg total) by mouth daily.  60 tablet 1  . Cranberry-Vitamin C 84-20 MG CAPS Take 2 capsules by mouth daily.    . hydrochlorothiazide (HYDRODIURIL) 25 MG tablet Take 1 tablet (25 mg total) by mouth daily. 30 tablet 1  . ibuprofen (ADVIL,MOTRIN) 200 MG tablet Take 200 mg by mouth every 6 (six) hours as needed for headache or mild pain.     . Multiple Vitamin (MULTIVITAMIN WITH MINERALS) TABS tablet Take 1 tablet by mouth daily.    . ondansetron (ZOFRAN-ODT) 4 MG disintegrating tablet Take 1 tablet (4 mg total) by mouth every 8 (eight) hours as needed for nausea or vomiting. 30 tablet 11   No current facility-administered medications for this visit.    Allergies:   Benzocaine; Epinephrine hcl; and Sulfa antibiotics    Social History:  The patient  reports that she has never smoked. She has never used smokeless tobacco. She reports that she drinks about 0.6 oz of alcohol per week. She reports that she does not use illicit drugs.   Family History:  The patient's family history includes Alcohol abuse in her father; Alzheimer's disease in her mother; Ulcers in her father and paternal grandmother. There is no history of Colon cancer or Stroke.    ROS:  Please see the history of present illness.    Review of Systems: Constitutional:  denies fever, chills, diaphoresis, appetite change and fatigue.  HEENT: denies photophobia, eye pain, redness, hearing loss, ear pain, congestion, sore throat, rhinorrhea, sneezing, neck pain, neck stiffness and tinnitus.  Respiratory: denies SOB, DOE, cough, chest tightness, and wheezing.  Cardiovascular: denies chest pain, palpitations and leg swelling.  Gastrointestinal: denies nausea, vomiting, abdominal pain, diarrhea, constipation, blood in stool.  Genitourinary: denies dysuria, urgency, frequency, hematuria, flank pain and difficulty urinating.  Musculoskeletal: denies  myalgias, back pain, joint swelling, arthralgias and gait problem.   Skin: denies pallor, rash and wound.    Neurological: denies dizziness, seizures, syncope, weakness, light-headedness, numbness and headaches.   Hematological: denies adenopathy, easy bruising, personal or family bleeding history.  Psychiatric/ Behavioral: denies suicidal ideation, mood changes, confusion, nervousness, sleep disturbance and agitation.       All other systems are reviewed and negative.    PHYSICAL EXAM: VS:  BP 110/68 mmHg  Pulse 68  Ht 5\' 6"  (1.676 m)  Wt 181 lb 9.6 oz (82.373 kg)  BMI 29.32 kg/m2 , BMI Body mass index is 29.32 kg/(m^2). GEN: Well nourished, well developed, in no acute distress HEENT: normal Neck: no JVD, carotid bruits, or masses Cardiac: RRR; no murmurs, rubs, or gallops,no edema  Respiratory:  clear to auscultation bilaterally, normal work of  breathing GI: soft, nontender, nondistended, + BS MS: no deformity or atrophy Skin: warm and dry, no rash Neuro:  Strength and sensation are intact Psych: normal  EKG:  EKG is not ordered today.  Recent Labs: 02/21/2015: B Natriuretic Peptide 92.8 05/24/2015: ALT 15; BUN 13; Creatinine, Ser 1.02*; Hemoglobin 14.6; Platelets 181; Potassium 3.3*; Sodium 139    Lipid Panel    Component Value Date/Time   CHOL 210* 05/15/2015 0416   TRIG 147 05/15/2015 0416   HDL 43 05/15/2015 0416   CHOLHDL 4.9 05/15/2015 0416   VLDL 29 05/15/2015 0416   LDLCALC 138* 05/15/2015 0416      Wt Readings from Last 3 Encounters:  08/26/15 181 lb 9.6 oz (82.373 kg)  06/01/15 180 lb 12.8 oz (82.01 kg)  05/24/15 177 lb 6.4 oz (80.468 kg)      Other studies Reviewed: Additional studies/ records that were reviewed today include: . Review of the above records demonstrates:    ASSESSMENT AND PLAN:  1.  Troponin elevation:  Stephanie Bell had an acute reaction to lidocaine with epinephrine. She developed tachycardia as well as probable coronary spasm. She had a mild troponin elevation.  She's been a full recovery and has not had any further episodes of chest  discomfort. Her Myoview study was essentially normal. Her echocardiogram was also essentially normal. She does have some mild diastolic dysfunction.      2.  Tachycardia:  She had a 30 day monitor which did not reveal any evidence of atrial fibrillation.  3. CVA -  She wore the event monitor for 2 weeks and there was no evidence of atrial fibrillation. She is been seen by neurology. Neurology's opinion was that if she controls her blood pressure, cholesterol levels that she was unlikely to have a another stroke.  We'll continue to see her on a regular basis and continue to monitor for signs of atrial fibrillation.  Current medicines are reviewed at length with the patient today.  The patient does not have concerns regarding medicines.  The following changes have been made:  no change  Labs/ tests ordered today include:  No orders of the defined types were placed in this encounter.     Disposition:   FU with me as needed.      Mertie Moores, MD  08/26/2015 10:26 AM    [facility] [place], [place] , [place]  [address] Phone: [phone]; Fax: [phone]   [facility]  [address] [place], [place]  [address] [phone]   Fax [phone]

## 2015-08-31 DIAGNOSIS — I679 Cerebrovascular disease, unspecified: Secondary | ICD-10-CM | POA: Diagnosis not present

## 2015-08-31 DIAGNOSIS — E785 Hyperlipidemia, unspecified: Secondary | ICD-10-CM | POA: Diagnosis not present

## 2015-10-26 DIAGNOSIS — H25813 Combined forms of age-related cataract, bilateral: Secondary | ICD-10-CM | POA: Diagnosis not present

## 2015-10-26 DIAGNOSIS — H52223 Regular astigmatism, bilateral: Secondary | ICD-10-CM | POA: Diagnosis not present

## 2015-12-28 DIAGNOSIS — R5383 Other fatigue: Secondary | ICD-10-CM | POA: Diagnosis not present

## 2015-12-28 DIAGNOSIS — E785 Hyperlipidemia, unspecified: Secondary | ICD-10-CM | POA: Diagnosis not present

## 2015-12-28 DIAGNOSIS — Z79899 Other long term (current) drug therapy: Secondary | ICD-10-CM | POA: Diagnosis not present

## 2016-06-01 DIAGNOSIS — L400 Psoriasis vulgaris: Secondary | ICD-10-CM | POA: Diagnosis not present

## 2016-06-01 DIAGNOSIS — L298 Other pruritus: Secondary | ICD-10-CM | POA: Diagnosis not present

## 2016-06-01 DIAGNOSIS — D2261 Melanocytic nevi of right upper limb, including shoulder: Secondary | ICD-10-CM | POA: Diagnosis not present

## 2016-06-01 DIAGNOSIS — Z85828 Personal history of other malignant neoplasm of skin: Secondary | ICD-10-CM | POA: Diagnosis not present

## 2016-06-01 DIAGNOSIS — L538 Other specified erythematous conditions: Secondary | ICD-10-CM | POA: Diagnosis not present

## 2016-06-01 DIAGNOSIS — D225 Melanocytic nevi of trunk: Secondary | ICD-10-CM | POA: Diagnosis not present

## 2016-06-01 DIAGNOSIS — L820 Inflamed seborrheic keratosis: Secondary | ICD-10-CM | POA: Diagnosis not present

## 2016-07-19 DIAGNOSIS — Z09 Encounter for follow-up examination after completed treatment for conditions other than malignant neoplasm: Secondary | ICD-10-CM | POA: Diagnosis not present

## 2016-07-20 ENCOUNTER — Other Ambulatory Visit (HOSPITAL_COMMUNITY): Payer: Self-pay | Admitting: General Surgery

## 2016-07-20 DIAGNOSIS — Z9889 Other specified postprocedural states: Secondary | ICD-10-CM

## 2016-07-21 ENCOUNTER — Other Ambulatory Visit: Payer: Self-pay | Admitting: General Surgery

## 2016-07-21 DIAGNOSIS — Z9889 Other specified postprocedural states: Secondary | ICD-10-CM

## 2016-07-24 ENCOUNTER — Ambulatory Visit
Admission: RE | Admit: 2016-07-24 | Discharge: 2016-07-24 | Disposition: A | Discharge status: Home / Self Care | Payer: PPO | Source: Ambulatory Visit | Attending: General Surgery | Admitting: General Surgery

## 2016-07-24 DIAGNOSIS — Z9889 Other specified postprocedural states: Secondary | ICD-10-CM

## 2016-07-24 DIAGNOSIS — K449 Diaphragmatic hernia without obstruction or gangrene: Secondary | ICD-10-CM | POA: Diagnosis not present

## 2016-08-18 DIAGNOSIS — Z09 Encounter for follow-up examination after completed treatment for conditions other than malignant neoplasm: Secondary | ICD-10-CM | POA: Diagnosis not present

## 2016-08-31 DIAGNOSIS — Z1329 Encounter for screening for other suspected endocrine disorder: Secondary | ICD-10-CM | POA: Diagnosis not present

## 2016-08-31 DIAGNOSIS — E782 Mixed hyperlipidemia: Secondary | ICD-10-CM | POA: Diagnosis not present

## 2016-08-31 DIAGNOSIS — I10 Essential (primary) hypertension: Secondary | ICD-10-CM | POA: Diagnosis not present

## 2016-08-31 DIAGNOSIS — Z131 Encounter for screening for diabetes mellitus: Secondary | ICD-10-CM | POA: Diagnosis not present

## 2016-09-07 DIAGNOSIS — Z0000 Encounter for general adult medical examination without abnormal findings: Secondary | ICD-10-CM | POA: Diagnosis not present

## 2016-10-10 DIAGNOSIS — Z79899 Other long term (current) drug therapy: Secondary | ICD-10-CM | POA: Insufficient documentation

## 2016-10-10 DIAGNOSIS — N3000 Acute cystitis without hematuria: Secondary | ICD-10-CM | POA: Diagnosis not present

## 2016-10-10 DIAGNOSIS — R1030 Lower abdominal pain, unspecified: Secondary | ICD-10-CM | POA: Diagnosis present

## 2016-10-10 DIAGNOSIS — K449 Diaphragmatic hernia without obstruction or gangrene: Secondary | ICD-10-CM | POA: Insufficient documentation

## 2016-10-10 DIAGNOSIS — R1033 Periumbilical pain: Secondary | ICD-10-CM | POA: Diagnosis not present

## 2016-10-10 DIAGNOSIS — B9689 Other specified bacterial agents as the cause of diseases classified elsewhere: Secondary | ICD-10-CM | POA: Diagnosis not present

## 2016-10-10 DIAGNOSIS — R110 Nausea: Secondary | ICD-10-CM | POA: Diagnosis not present

## 2016-10-11 ENCOUNTER — Emergency Department (HOSPITAL_COMMUNITY): Payer: PPO

## 2016-10-11 ENCOUNTER — Emergency Department (HOSPITAL_COMMUNITY)
Admission: EM | Admit: 2016-10-11 | Discharge: 2016-10-11 | Disposition: A | Discharge status: Home / Self Care | Payer: PPO | Attending: Emergency Medicine | Admitting: Emergency Medicine

## 2016-10-11 ENCOUNTER — Encounter (HOSPITAL_COMMUNITY): Payer: Self-pay

## 2016-10-11 DIAGNOSIS — R52 Pain, unspecified: Secondary | ICD-10-CM

## 2016-10-11 DIAGNOSIS — R110 Nausea: Secondary | ICD-10-CM | POA: Diagnosis not present

## 2016-10-11 DIAGNOSIS — K449 Diaphragmatic hernia without obstruction or gangrene: Secondary | ICD-10-CM

## 2016-10-11 DIAGNOSIS — N3000 Acute cystitis without hematuria: Secondary | ICD-10-CM

## 2016-10-11 LAB — COMPREHENSIVE METABOLIC PANEL
ALBUMIN: 4 g/dL (ref 3.5–5.0)
ALT: 19 U/L (ref 14–54)
AST: 23 U/L (ref 15–41)
Alkaline Phosphatase: 60 U/L (ref 38–126)
Anion gap: 7 (ref 5–15)
BUN: 12 mg/dL (ref 6–20)
CHLORIDE: 107 mmol/L (ref 101–111)
CO2: 25 mmol/L (ref 22–32)
CREATININE: 0.78 mg/dL (ref 0.44–1.00)
Calcium: 9.1 mg/dL (ref 8.9–10.3)
GFR calc Af Amer: >60 mL/min (ref >60)
GLUCOSE: 162 mg/dL — AB (ref 65–99)
Potassium: 3.9 mmol/L (ref 3.5–5.1)
Sodium: 139 mmol/L (ref 135–145)
Total Bilirubin: 0.7 mg/dL (ref 0.3–1.2)
Total Protein: 7 g/dL (ref 6.5–8.1)

## 2016-10-11 LAB — URINALYSIS, ROUTINE W REFLEX MICROSCOPIC
Bacteria, UA: NONE SEEN
Bilirubin Urine: NEGATIVE
Glucose, UA: NEGATIVE mg/dL
Ketones, ur: NEGATIVE mg/dL
Nitrite: NEGATIVE
PROTEIN: NEGATIVE mg/dL
SPECIFIC GRAVITY, URINE: 1.019 (ref 1.005–1.030)
pH: 5 (ref 5.0–8.0)

## 2016-10-11 LAB — CBC
HCT: 40.8 % (ref 36.0–46.0)
Hemoglobin: 14.1 g/dL (ref 12.0–15.0)
MCH: 29 pg (ref 26.0–34.0)
MCHC: 34.6 g/dL (ref 30.0–36.0)
MCV: 83.8 fL (ref 78.0–100.0)
PLATELETS: 154 10*3/uL (ref 150–400)
RBC: 4.87 MIL/uL (ref 3.87–5.11)
RDW: 13.2 % (ref 11.5–15.5)
WBC: 15.5 10*3/uL — AB (ref 4.0–10.5)

## 2016-10-11 LAB — LIPASE, BLOOD: LIPASE: 21 U/L (ref 11–51)

## 2016-10-11 MED ORDER — FOSFOMYCIN TROMETHAMINE 3 G PO PACK
3.0000 g | PACK | Freq: Once | ORAL | Status: AC
  Administered 2016-10-11: 3 g via ORAL
  Filled 2016-10-11: qty 3

## 2016-10-11 MED ORDER — ONDANSETRON HCL 4 MG/2ML IJ SOLN: 4.0000 mg | Freq: Once | INTRAMUSCULAR | Status: DC | PRN

## 2016-10-11 MED ORDER — ONDANSETRON 4 MG PO TBDP
4.0000 mg | ORAL_TABLET | Freq: Once | ORAL | Status: AC | PRN
  Administered 2016-10-11: 4 mg via ORAL
  Filled 2016-10-11: qty 1

## 2016-10-11 MED ORDER — PHENAZOPYRIDINE HCL 200 MG PO TABS
200.0000 mg | ORAL_TABLET | Freq: Three times a day (TID) | ORAL | Status: DC
  Administered 2016-10-11: 200 mg via ORAL
  Filled 2016-10-11: qty 1

## 2016-10-11 NOTE — ED Triage Notes (Signed)
Pt complains of lower abd pain that started right after dinner Pt has hx of hernia repair in 2015 and was told never to vomit, she started to feel very nauseated so she came in to be evaluated

## 2016-10-11 NOTE — ED Notes (Signed)
Pt is alert and oriented x 4 and is verbally responsive. Pt states that after she at dinner started to have lower abdominal pain that worsened with activity, with worsening nausea. Pt found in room sleeping , pt states pain is 1/10 now and that nausea has decreased,.

## 2016-10-11 NOTE — ED Provider Notes (Signed)
[facility] DEPT Provider Note   CSN: [id number] Arrival date & time: 10/10/16  2146   By signing my name below, I, Eunice Blase, attest that this documentation has been prepared under the direction and in the presence of [place], Jerome Viglione, MD. Electronically signed, Eunice Blase, ED Scribe. 10/11/16. 3:21 AM.   History   Chief Complaint Chief Complaint  Patient presents with  . Abdominal Pain   The history is provided by the patient and medical records. No language interpreter was used.  Abdominal Pain   This is a recurrent problem. The current episode started 3 to 5 hours ago. The problem has been resolved. The pain is associated with eating and a previous surgery. The pain is located in the suprapubic region. The quality of the pain is cramping and pressure-like. The pain is at a severity of 0/10. The patient is experiencing no pain (no pain on evaluation). Associated symptoms include flatus. Pertinent negatives include fever, diarrhea, nausea (resolved), vomiting, constipation and dysuria. Nothing aggravates the symptoms. Nothing relieves the symptoms. Past workup includes surgery.    DARNETTE LAMPRON is a 76 y.o. female with h/o hiatal hernia, UTI and kidney stone presenting to the Emergency Department concerning lower abdominal pain onset PTA. Nausea reported in triage; this has resolved after the pt was given zofran prior to evaluation. Flatus noted while in [facility] ED. Pt states this has started since she has been drinking soda. She describes constant, sharp, 9/10 lower abdominal pain that resolved while waiting prior to evaluation. Pt states she took OTC medications for nausea and gas at home PTA. H/o hiatal hernia repair noted. NL BM's noted. No vomiting, dysuria, diarrhea or any other complaints noted at this time.   Past Medical History:  Diagnosis Date  . ADD (attention deficit disorder)   . Esophageal stricture 11/04/2014  . GERD (gastroesophageal reflux disease)   . H/O hiatal  hernia   . Hx: UTI (urinary tract infection)   . Interstitial cystitis   . Kidney stone   . Paraesophageal hernia   . Pneumonia 05-09-11   2011 last time  . Stroke ([place])   . Weight loss   . Wound disruption, post-op, skin   . 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 CVA (cerebral infarction) 05/14/2015  . Chest tightness 02/21/2015  . Elevated troponin 01/22/2015  . Esophageal stricture 11/04/2014  . GERD (gastroesophageal reflux disease) 11/04/2014  . Nausea without vomiting 11/04/2014  . Belching 11/04/2014  . 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 Sebaceous cyst of breast 12/20/2011  . S/P Nissen fundoplication (without gastrostomy tube) procedure 06/08/2011    Past Surgical History:  Procedure Laterality Date  . BACK SURGERY  05-09-11   x 2 lumbar  . CHOLECYSTECTOMY  05-09-11   laparoscopic  . COLONOSCOPY  2002, 2008    rectal hyperplastic polyp 2002, diverticulosis 2008  . ESOPHAGOGASTRODUODENOSCOPY    . HERNIA REPAIR  05/16/2011   hiatal hernia  . HIATAL HERNIA REPAIR  02/09/2012   Procedure: LAPAROSCOPIC REPAIR OF HIATAL HERNIA;  Surgeon: Pedro Earls, MD;  [facility];  Service: General;  Laterality: N/A;  . INSERTION OF MESH  02/09/2012   Procedure: INSERTION OF MESH;  Surgeon: Pedro Earls, MD;  [facility];  Service: General;;  . IRRIGATION AND DEBRIDEMENT SEBACEOUS CYST  05/16/2011   Procedure: IRRIGATION AND DEBRIDEMENT SEBACEOUS CYST;  Surgeon: Rodman Key  Verdie Drown, MD;  Location: [facility];  Service: General;  Laterality: N/A;  middle of chest on sternum  . KIDNEY STONE SURGERY  05-09-11   past hx. and cystoscopy for bladder issues  . LAPAROSCOPIC NISSEN FUNDOPLICATION  10/12/3662   Procedure: LAPAROSCOPIC NISSEN FUNDOPLICATION;  Surgeon: Pedro Earls, MD;  [facility];  Service: General;  Laterality: N/A;  will  also remove sebaceous cyst of chest wall  . LAPAROSCOPIC NISSEN FUNDOPLICATION  40/34/7425   Procedure: LAPAROSCOPIC NISSEN FUNDOPLICATION;  Surgeon: Pedro Earls, MD;  [facility];  Service: General;  Laterality: N/A;  Re-do Laparoscopic Nissen   . SEPTOPLASTY    . TONSILLECTOMY  05-09-11   Tonsillectomy  . UMBILICAL HERNIA REPAIR  05/16/2011   Procedure: HERNIA REPAIR UMBILICAL ADULT;  Surgeon: Pedro Earls, MD;  [facility];  Service: General;  Laterality: N/A;  with mesh   . VAGINAL HYSTERECTOMY      OB History    No data available       Home Medications    Prior to Admission medications   Medication Sig Start Date End Date Taking? Authorizing Provider  atorvastatin (LIPITOR) 40 MG tablet Take 1 tablet (40 mg total) by mouth daily at 6 PM. 05/15/15   Sainani, Belia Heman, MD  Cholecalciferol (VITAMIN D3) 5000 UNITS CAPS Take 5,000 Units by mouth daily.    [provider]  clopidogrel (PLAVIX) 75 MG tablet Take 1 tablet (75 mg total) by mouth daily. 05/15/15   Henreitta Leber, MD  Cranberry-Vitamin C 84-20 MG CAPS Take 2 capsules by mouth daily.    [provider]  hydrochlorothiazide (HYDRODIURIL) 25 MG tablet Take 1 tablet (25 mg total) by mouth daily. Patient taking differently: Take 12.5 mg by mouth daily.  05/15/15   Henreitta Leber, MD  ibuprofen (ADVIL,MOTRIN) 200 MG tablet Take 200 mg by mouth every 6 (six) hours as needed for headache or mild pain.     [provider]  Multiple Vitamin (MULTIVITAMIN WITH MINERALS) TABS tablet Take 1 tablet by mouth daily.    [provider]  ondansetron (ZOFRAN-ODT) 4 MG disintegrating tablet Take 1 tablet (4 mg total) by mouth every 8 (eight) hours as needed for nausea or vomiting. 11/04/14   Gatha Mayer, MD    Family History Family History  Problem Relation Age of Onset  . Ulcers Father   . Alcohol abuse Father   . Alzheimer's disease Mother   . Ulcers Paternal Grandmother   .  Colon cancer Neg Hx   . Stroke Neg Hx     Social History Social History  Substance Use Topics  . Smoking status: Never Smoker  . Smokeless tobacco: Never Used  . Alcohol use 0.6 oz/week    1 Glasses of wine per week     Comment: Daily 4oz     Allergies   Benzocaine; Epinephrine; and Sulfa antibiotics   Review of Systems Review of Systems  Constitutional: Negative for fever.  Gastrointestinal: Positive for flatus. Negative for abdominal pain (resolved), constipation, diarrhea, nausea (resolved) and vomiting.  Genitourinary: Negative for dysuria.  All other systems reviewed and are negative.    Physical Exam Updated Vital Signs BP 137/90 (BP Location: Left Arm)   Pulse 91   Temp 97.7 F (36.5 C) (Oral)   Resp 18   Ht 5\' 5"  (1.651 m)   Wt 185 lb 3.2 oz (84 kg)   SpO2 97%   BMI 30.82 kg/m  Physical Exam  Constitutional: She is oriented to person, place, and time. She appears well-developed and well-nourished. No distress.  HENT:  Mouth/Throat: Oropharynx is clear and moist and mucous membranes are normal. No oropharyngeal exudate.  Eyes: Pupils are equal, round, and reactive to light. Conjunctivae are normal.  Neck: Normal range of motion. Neck supple. No JVD present. Carotid bruit is not present.  Cardiovascular: Normal rate, regular rhythm, normal heart sounds and intact distal pulses.   Pulmonary/Chest: Effort normal and breath sounds normal. No stridor. She has no wheezes. She has no rales.  Abdominal: Soft. Bowel sounds are normal. She exhibits no fluid wave and no mass. There is no tenderness. There is no rebound and no guarding.  Musculoskeletal: Normal range of motion. She exhibits no edema.  Neurological: She is alert and oriented to person, place, and time. She displays normal reflexes.  Skin: Skin is warm and dry. Capillary refill takes less than 2 seconds.  Psychiatric: She has a normal mood and affect.  Nursing note and vitals reviewed.    ED  Treatments / Results  DIAGNOSTIC STUDIES: Oxygen Saturation is 97% on RA, NL by my interpretation.    COORDINATION OF CARE: 3:18 AM-Discussed next steps with pt. Pt verbalized understanding and is agreeable with the plan. Will order C/T.   Labs (all labs ordered are listed, but only abnormal results are displayed) Labs Reviewed  COMPREHENSIVE METABOLIC PANEL - Abnormal; Notable for the following:       Result Value   Glucose, Bld 162 (*)    All other components within normal limits  CBC - Abnormal; Notable for the following:    WBC 15.5 (*)    All other components within normal limits  URINALYSIS, ROUTINE W REFLEX MICROSCOPIC - Abnormal; Notable for the following:    APPearance HAZY (*)    Hgb urine dipstick SMALL (*)    Leukocytes, UA MODERATE (*)    Squamous Epithelial / LPF 0-5 (*)    All other components within normal limits  LIPASE, BLOOD     Procedures Procedures (including critical care time)  Medications Ordered in ED  Medications  ondansetron (ZOFRAN) injection 4 mg (4 mg Intravenous Not Given 10/11/16 0252)  phenazopyridine (PYRIDIUM) tablet 200 mg (200 mg Oral Given 10/11/16 0519)  ondansetron (ZOFRAN-ODT) disintegrating tablet 4 mg (4 mg Oral Given 10/11/16 0153)  fosfomycin (MONUROL) packet 3 g (3 g Oral Given 10/11/16 0518)    Final Clinical Impressions(s) / ED Diagnoses  UTI: Return for weakness, difficulty ambulating,  inability to tolerate oral medication, worsening pain, fevers, vomiting, altered level of consciousness, or any concerns. No signs of systemic illness. Exam uis benign and reassuring have treated for UTI in the ED.  Follow up with Dr. Hassell Done regarding your hiatal hernia. The patient is nontoxic-appearing on exam and vital signs are within normal limits.   I have reviewed the triage vital signs and the nursing notes. Pertinent labs &imaging results that were available during my care of the patient were reviewed by me and considered in my medical  decision making (see chart for details).  After history, exam, and medical workup I feel the patient has been appropriately medically screened and is safe for discharge home. Pertinent diagnoses were discussed with the patient. Patient was given return precautions.   I personally performed the services described in this documentation, which was scribed in my presence. The recorded information has been reviewed and is accurate.      Jarmal Lewelling, MD 10/11/16  0613  

## 2016-10-25 DIAGNOSIS — Z87898 Personal history of other specified conditions: Secondary | ICD-10-CM | POA: Diagnosis not present

## 2016-12-25 ENCOUNTER — Ambulatory Visit (INDEPENDENT_AMBULATORY_CARE_PROVIDER_SITE_OTHER): Payer: PPO | Admitting: Neurology

## 2016-12-25 ENCOUNTER — Encounter: Payer: Self-pay | Admitting: Neurology

## 2016-12-25 VITALS — BP 122/84 | HR 77 | Ht 65.0 in | Wt 186.0 lb

## 2016-12-25 DIAGNOSIS — R251 Tremor, unspecified: Secondary | ICD-10-CM | POA: Diagnosis not present

## 2016-12-25 NOTE — Patient Instructions (Signed)
  DAT Scan (Dopamine Transport Scan)  Tremor A tremor is trembling or shaking that you cannot control. Most tremors affect the hands or arms. Tremors can also affect the head, vocal cords, face, and other parts of the body. There are many types of tremors. Common types include:  Essential tremor. These usually occur in people over the age of 28. It may run in families and can happen in otherwise healthy people.  Resting tremor. These occur when the muscles are at rest, such as when your hands are resting in your lap. People with Parkinson disease often have resting tremors.  Postural tremor. These occur when you try to hold a pose, such as keeping your hands outstretched.  Kinetic tremor. These occur during purposeful movement, such as trying to touch a finger to your nose.  Task-specific tremor. These may occur when you perform tasks such as handwriting, speaking, or standing.  Psychogenic tremor. These dramatically lessen or disappear when you are distracted. They can happen in people of all ages.  Some types of tremors have no known cause. Tremors can also be a symptom of nervous system problems (neurological disorders) that may occur with aging. Some tremors go away with treatment while others do not. Follow these instructions at home: Watch your tremor for any changes. The following actions may help to lessen any discomfort you are feeling:  Take medicines only as directed by your health care provider.  Limit alcohol intake to no more than 1 drink per day for nonpregnant women and 2 drinks per day for men. One drink equals 12 oz of beer, 5 oz of wine, or 1 oz of hard liquor.  Do not use any tobacco products, including cigarettes, chewing tobacco, or electronic cigarettes. If you need help quitting, ask your health care provider.  Avoid extreme heat or cold.  Limit the amount of caffeine you consumeas directed by your health care provider.  Try to get 8 hours of sleep each  night.  Find ways to manage your stress, such as meditation or yoga.  Keep all follow-up visits as directed by your health care provider. This is important.  Contact a health care provider if:  You start having a tremor after starting a new medicine.  You have tremor with other symptoms such as: ? Numbness. ? Tingling. ? Pain. ? Weakness.  Your tremor gets worse.  Your tremor interferes with your day-to-day life. This information is not intended to replace advice given to you by your health care provider. Make sure you discuss any questions you have with your health care provider. Document Released: 02/24/2002 Document Revised: 11/07/2015 Document Reviewed: 09/01/2013 Elsevier Interactive Patient Education  [employer].

## 2016-12-25 NOTE — Progress Notes (Signed)
[facility]    Provider:  Dr Stephanie Bell Referring Provider: [place] Pink, MD Primary Care Physician:  Stephanie Pink, MD  CC:  Thalamic stroke  Interval history 12/25/2016; She has noticed tremor in her left hand noticed it first in may when driving and more recently when holding something heavy, she notices it at rest as well. She has some stress in her life. Happens every day, No caffeine. She is on aspirin. No tremor in the family. No FHx of Parkinson's. No slowing, no shuffling, no falls so far.   HPI:  Stephanie Bell is a lovely 76 y.o. female here as a referral from Dr. Kary Bell for thalamic stroke. She has a past medical history of hypertension, hyperlipidemia, no diabetes. She presented to the hospital on February 24 after waking up with right foot and leg numbness. She also reported weakness. No other neurologic deficits. No aphasia, no dysphagia, no dysarthria, no other weakness or numbness, no altered mentation. Her husband gave her to aspirin may went to the emergency room. She had an MRI of the brain which was positive for stroke.  Sheis feeling much better. Hisband here and provides information as well. No more numbness and no more weakness. She feels scared that she had a stroke, doesn't understand why or where in the brain she had it. Never had a stroke before. There were no inciting events, she just woke up. No FHx of stroke.  Reviewed notes, labs and imaging from outside physicians, which showed:  ldl 138. hgba1c wnl  Personally reviewed images with patient and her husband and agree with the following:  MRI HEAD FINDINGS  Calvarium and upper cervical spine: No focal marrow signal abnormality.  Orbits: Negative.  Sinuses and Mastoids: Mucosal thickening on the left, overall mild. No effusion.  Brain: Ovoid 1 cm area of restricted diffusion in the lateral left thalamus, extending into the posterior limb internal capsule. No hemorrhagic  conversion. No other territory of acute infarct. No evidence of major vessel occlusion.  Elsewhere, microvascular ischemic change is mild/expected for age. No previous infarct. No chronic hemorrhagic foci.  MRA HEAD FINDINGS  Symmetric carotid and vertebral arteries. Intact circle Willis with small anterior communicating artery.  Symmetric vertebrobasilar branching. Symmetric poor signal in distal vertebral and proximal basilar arteries is considered artifactual. These vessels have normal signal on T2 weighted conventional imaging.  Atherosclerotic type narrowings:  Distal left M1 segment high-grade tandem stenoses.  Moderate right proximal M2 segment stenosis.  Bilateral M3 narrowing, potentially overestimated due to artifact (poor signal in the bilateral A2 segments appears artifactual).  Bilateral atherosclerotic irregularity of the proximal PCA.   IMPRESSION: 1. 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 2. No acute arterial finding. 3. Intracranial atherosclerosis as described. No asymmetric disease in the proximal left PCA to correlate with #1.  Ultrasound carotids with no hemodynamically significant stenosis.  Review of Systems: Patient complains of symptoms per HPI as well as the following symptoms: tremor. Pertinent negatives and positives per HPI. All others negative.   Social History   Social History  . Marital status: Married    Spouse name: Stephanie Bell  . Number of children: 3  . Years of education: College   Occupational History  . Not on file.   Social History Main Topics  . Smoking status: Never Smoker  . Smokeless tobacco: Never Used  . Alcohol use 0.6 oz/week    1 Glasses of wine per week     Comment: Daily 4oz  .  Drug use: No  . Sexual activity: Yes   Other Topics Concern  . Not on file   Social History Narrative   Lives with husband.   Caffeine use: none    Family History  Problem Relation Age of Onset  . Ulcers  Father   . Alcohol abuse Father   . Alzheimer's disease Mother   . Ulcers Paternal Grandmother   . Colon cancer Neg Hx   . Stroke Neg Hx     Past Medical History:  Diagnosis Date  . ADD (attention deficit disorder)   . Esophageal stricture 11/04/2014  . GERD (gastroesophageal reflux disease)   . H/O hiatal hernia   . Hx: UTI (urinary tract infection)   . Interstitial cystitis   . Kidney stone   . Paraesophageal hernia   . Pneumonia 05-09-11   2011 last time  . Stroke ([place])   . Weight loss   . Wound disruption, post-op, skin   . 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 BACK SURGERY  05-09-11   x 2 lumbar  . CHOLECYSTECTOMY  05-09-11   laparoscopic  . COLONOSCOPY  2002, 2008    rectal hyperplastic polyp 2002, diverticulosis 2008  . ESOPHAGOGASTRODUODENOSCOPY    . HERNIA REPAIR  05/16/2011   hiatal hernia  . HIATAL HERNIA REPAIR  02/09/2012   Procedure: LAPAROSCOPIC REPAIR OF HIATAL HERNIA;  Surgeon: Pedro Earls, MD;  Location: [facility];  Service: General;  Laterality: N/A;  . INSERTION OF MESH  02/09/2012   Procedure: INSERTION OF MESH;  Surgeon: Pedro Earls, MD;  Location: [facility];  Service: General;;  . IRRIGATION AND DEBRIDEMENT SEBACEOUS CYST  05/16/2011   Procedure: IRRIGATION AND DEBRIDEMENT SEBACEOUS CYST;  Surgeon: Pedro Earls, MD;  Location: [facility];  Service: General;  Laterality: N/A;  middle of chest on sternum  . KIDNEY STONE SURGERY  05-09-11   past hx. and cystoscopy for bladder issues  . LAPAROSCOPIC NISSEN FUNDOPLICATION  2/53/6644   Procedure: LAPAROSCOPIC NISSEN FUNDOPLICATION;  Surgeon: Pedro Earls, MD;  Location: [facility];  Service: General;  Laterality: N/A;  will also remove sebaceous cyst of chest wall  . LAPAROSCOPIC NISSEN FUNDOPLICATION  03/47/4259   Procedure: LAPAROSCOPIC NISSEN FUNDOPLICATION;  Surgeon: Pedro Earls, MD;  Location: [facility];  Service: General;   Laterality: N/A;  Re-do Laparoscopic Nissen   . SEPTOPLASTY    . TONSILLECTOMY  05-09-11   Tonsillectomy  . UMBILICAL HERNIA REPAIR  05/16/2011   Procedure: HERNIA REPAIR UMBILICAL ADULT;  Surgeon: Pedro Earls, MD;  Location: [facility];  Service: General;  Laterality: N/A;  with mesh   . VAGINAL HYSTERECTOMY      Current Outpatient Prescriptions  Medication Sig Dispense Refill  . aspirin 81 MG chewable tablet Chew 81 mg by mouth daily.    . Cholecalciferol (VITAMIN D3) 5000 UNITS CAPS Take 5,000 Units by mouth daily.    . Cranberry-Vitamin C 84-20 MG CAPS Take 2 capsules by mouth daily.    . hydrochlorothiazide (HYDRODIURIL) 25 MG tablet Take 1 tablet (25 mg total) by mouth daily. (Patient taking differently: Take 12.5 mg by mouth daily. ) 30 tablet 1  . ibuprofen (ADVIL,MOTRIN) 200 MG tablet Take 200 mg by mouth every 6 (six) hours as needed for headache or mild pain.     Marland Kitchen ondansetron (ZOFRAN-ODT) 4 MG disintegrating tablet Take 1 tablet (4 mg total) by mouth every 8 (eight) hours as  needed for nausea or vomiting. 30 tablet 11  . vitamin B-12 (CYANOCOBALAMIN) 1000 MCG tablet Take 1,000 mcg by mouth daily.     No current facility-administered medications for this visit.     Allergies as of 12/25/2016 - Review Complete 12/25/2016  Allergen Reaction Noted  . Benzocaine Palpitations 05/24/2015  . Epinephrine Palpitations and Other (See Comments) 02/21/2015  . Sulfa antibiotics Swelling and Rash 02/22/2011    Vitals: BP 122/84   Pulse 77   Ht 5\' 5"  (1.651 m)   Wt 186 lb (84.4 kg)   BMI 30.95 kg/m  Last Weight:  Wt Readings from Last 1 Encounters:  12/25/16 186 lb (84.4 kg)   Last Height:   Ht Readings from Last 1 Encounters:  12/25/16 5\' 5"  (1.651 m)   Physical exam: Exam: Gen: NAD, conversant, well nourised, obese, well groomed                     CV: RRR, no MRG. No Carotid Bruits. No peripheral edema, warm, nontender Eyes: Conjunctivae clear without exudates or  hemorrhage  Neuro: Detailed Neurologic Exam  Speech:    Speech is normal; fluent and spontaneous with normal comprehension.  Cognition:    The patient is oriented to person, place, and time;     Cranial Nerves:    The pupils are equal, round, and reactive to light. The fundi are normal and spontaneous venous pulsations are present. Visual fields are full to finger confrontation. Extraocular movements are intact. Trigeminal sensation is intact and the muscles of mastication are normal. The face is symmetric. The palate elevates in the midline. Hearing intact. Voice is normal. Shoulder shrug is normal. The tongue has normal motion without fasciculations.   Motor Observation: resting tremor   Gait: Low clearance, narrow gait, small strides but not classically shuffling     Tone:    Normal muscle tone.    Posture:    Minimally stooped    Strength:    Strength is V/V in the upper and lower limbs.      Sensation: intact to LT    Assessment/Plan:  This is a lovely 76 year old female with hypertension and hyperlipidemia who presented to [facility] on 02/21/2015 with a nonhemorrhagic lacunar infarct in the left thalamus due to small vessel disease. She was on aspirin at home. ldl 138. hgba1c wnl. MRA did show significant atherosclerotic disease. Discussed with patient and husband and reviewed images with him.   She is here with a new onset resting and action tremor. Exam shows resting course tremor, no action tremor visible or postural tremor. Exam does show some mild stooped posture, and gait with Low clearance, narrow gait, small strides but not classically shuffling. May be the onset of parkinson'd disease. Gave patient the option to just observe for now, also DAT scan. Patient prefers DAT scan, will order.     Sarina Ill, MD  [facility] [address] [place], [place] [address]  Phone [phone] Fax [phone]  A total of 25  minutes was spent in with this patient. Over half this time was spent on counseling patient on the tremor diagnosis and different therapeutic options available.

## 2016-12-26 LAB — COMPREHENSIVE METABOLIC PANEL
A/G RATIO: 2 (ref 1.2–2.2)
ALT: 15 IU/L (ref 0–32)
AST: 21 IU/L (ref 0–40)
Albumin: 4.1 g/dL (ref 3.5–4.8)
Alkaline Phosphatase: 65 IU/L (ref 39–117)
BUN/Creatinine Ratio: 9 — ABNORMAL LOW (ref 12–28)
BUN: 7 mg/dL — ABNORMAL LOW (ref 8–27)
Bilirubin Total: 0.8 mg/dL (ref 0.0–1.2)
CALCIUM: 9 mg/dL (ref 8.7–10.3)
CO2: 24 mmol/L (ref 20–29)
CREATININE: 0.81 mg/dL (ref 0.57–1.00)
Chloride: 103 mmol/L (ref 96–106)
GFR, EST AFRICAN AMERICAN: 81 mL/min/{1.73_m2} (ref >59)
GFR, EST NON AFRICAN AMERICAN: 70 mL/min/{1.73_m2} (ref >59)
GLOBULIN, TOTAL: 2.1 g/dL (ref 1.5–4.5)
Glucose: 104 mg/dL — ABNORMAL HIGH (ref 65–99)
POTASSIUM: 4.2 mmol/L (ref 3.5–5.2)
Sodium: 141 mmol/L (ref 134–144)
TOTAL PROTEIN: 6.2 g/dL (ref 6.0–8.5)

## 2016-12-26 LAB — CBC
HEMATOCRIT: 40.8 % (ref 34.0–46.6)
Hemoglobin: 13.6 g/dL (ref 11.1–15.9)
MCH: 28.8 pg (ref 26.6–33.0)
MCHC: 33.3 g/dL (ref 31.5–35.7)
MCV: 86 fL (ref 79–97)
Platelets: 146 10*3/uL — ABNORMAL LOW (ref 150–379)
RBC: 4.72 x10E6/uL (ref 3.77–5.28)
RDW: 14.4 % (ref 12.3–15.4)
WBC: 5.3 10*3/uL (ref 3.4–10.8)

## 2016-12-26 LAB — TSH: TSH: 0.578 u[IU]/mL (ref 0.450–4.500)

## 2016-12-27 ENCOUNTER — Telehealth: Payer: Self-pay | Admitting: *Deleted

## 2016-12-27 DIAGNOSIS — R251 Tremor, unspecified: Secondary | ICD-10-CM

## 2016-12-27 NOTE — Telephone Encounter (Signed)
Called pt on mobile & home, LVM on home # asking for call back.

## 2016-12-27 NOTE — Telephone Encounter (Deleted)
-----   Message from Melvenia Beam, MD sent at 12/26/2016  6:58 PM EDT ----- Labs unremarkable. Her platelets are still fine but  borderline low, but this appears chronic as this has been the case multiple times in the past. Review with pcp at next appointment appointment thanks

## 2016-12-27 NOTE — Telephone Encounter (Signed)
-----   Message from Melvenia Beam, MD sent at 12/26/2016  6:58 PM EDT ----- Labs unremarkable. Her platelets are still fine but  borderline low, but this appears chronic as this has been the case multiple times in the past. Review with pcp at next appointment appointment thanks

## 2016-12-27 NOTE — Telephone Encounter (Signed)
Thank you :)

## 2016-12-27 NOTE — Telephone Encounter (Signed)
Pt called back and was informed of Dr. Cathren Laine result notes.  She was concerned about her daily aspirin and plt level. She states she will f/u with her PCP. I informed her that her PCP would be have these results. Pt asked about DAT scan. I did not see an order, but per Dr. Cathren Laine note, pt will receive scan. I will put in an order.

## 2017-01-08 ENCOUNTER — Telehealth: Payer: Self-pay | Admitting: Neurology

## 2017-01-08 NOTE — Telephone Encounter (Signed)
Pt is wanting to discuss DAT scan. She request a call back today, she is going out of town. Please call

## 2017-01-08 NOTE — Telephone Encounter (Signed)
Called patient, she is concerned about the DAT scan and receiving radiation. She states that unless a diagnosis of parkinsons can be definitive from this test, she does not want it. She reports she has already canceled the appointment for the DAT scan. Her next office visit is in Feb 2019.   I have discussed this with Dr. Jaynee Eagles. She is aware and has given the patient the option to observe and follow up at her next visit. Patient was called back and made aware. She will call if symptoms worsen in the meantime.

## 2017-01-18 ENCOUNTER — Other Ambulatory Visit (HOSPITAL_COMMUNITY): Payer: PPO

## 2017-01-18 ENCOUNTER — Encounter (HOSPITAL_COMMUNITY): Payer: PPO

## 2017-02-17 DIAGNOSIS — R002 Palpitations: Secondary | ICD-10-CM

## 2017-02-17 HISTORY — DX: Palpitations: R00.2

## 2017-02-22 ENCOUNTER — Telehealth: Payer: Self-pay | Admitting: Cardiovascular Disease

## 2017-02-22 DIAGNOSIS — H524 Presbyopia: Secondary | ICD-10-CM | POA: Diagnosis not present

## 2017-02-22 DIAGNOSIS — H52223 Regular astigmatism, bilateral: Secondary | ICD-10-CM | POA: Diagnosis not present

## 2017-02-22 DIAGNOSIS — H25013 Cortical age-related cataract, bilateral: Secondary | ICD-10-CM | POA: Diagnosis not present

## 2017-02-22 DIAGNOSIS — H2513 Age-related nuclear cataract, bilateral: Secondary | ICD-10-CM | POA: Diagnosis not present

## 2017-02-22 NOTE — Telephone Encounter (Signed)
New Message     Patient would like to switch from DR Nahser to Dr Tamala Julian, Dr Tamala Julian is currently her husbands doctor and she would like to see him as well.

## 2017-02-22 NOTE — Telephone Encounter (Signed)
OK with me for her to see Dr. Tamala Julian

## 2017-02-24 NOTE — Telephone Encounter (Signed)
ok 

## 2017-03-04 ENCOUNTER — Other Ambulatory Visit: Payer: Self-pay

## 2017-03-04 ENCOUNTER — Encounter (HOSPITAL_COMMUNITY): Payer: Self-pay | Admitting: Emergency Medicine

## 2017-03-04 ENCOUNTER — Observation Stay (HOSPITAL_COMMUNITY)
Admission: EM | Admit: 2017-03-04 | Discharge: 2017-03-05 | Disposition: A | Discharge status: Home / Self Care | Payer: PPO | Attending: Internal Medicine | Admitting: Internal Medicine

## 2017-03-04 ENCOUNTER — Emergency Department (HOSPITAL_COMMUNITY): Payer: PPO

## 2017-03-04 DIAGNOSIS — Z79899 Other long term (current) drug therapy: Secondary | ICD-10-CM | POA: Diagnosis not present

## 2017-03-04 DIAGNOSIS — I493 Ventricular premature depolarization: Secondary | ICD-10-CM | POA: Insufficient documentation

## 2017-03-04 DIAGNOSIS — Z882 Allergy status to sulfonamides status: Secondary | ICD-10-CM | POA: Diagnosis not present

## 2017-03-04 DIAGNOSIS — Z8673 Personal history of transient ischemic attack (TIA), and cerebral infarction without residual deficits: Secondary | ICD-10-CM | POA: Diagnosis not present

## 2017-03-04 DIAGNOSIS — E785 Hyperlipidemia, unspecified: Secondary | ICD-10-CM | POA: Insufficient documentation

## 2017-03-04 DIAGNOSIS — R079 Chest pain, unspecified: Secondary | ICD-10-CM | POA: Diagnosis present

## 2017-03-04 DIAGNOSIS — Z888 Allergy status to other drugs, medicaments and biological substances status: Secondary | ICD-10-CM | POA: Diagnosis not present

## 2017-03-04 DIAGNOSIS — R739 Hyperglycemia, unspecified: Secondary | ICD-10-CM | POA: Diagnosis not present

## 2017-03-04 DIAGNOSIS — Z886 Allergy status to analgesic agent status: Secondary | ICD-10-CM | POA: Insufficient documentation

## 2017-03-04 DIAGNOSIS — I2510 Atherosclerotic heart disease of native coronary artery without angina pectoris: Secondary | ICD-10-CM | POA: Insufficient documentation

## 2017-03-04 DIAGNOSIS — I10 Essential (primary) hypertension: Secondary | ICD-10-CM | POA: Diagnosis not present

## 2017-03-04 DIAGNOSIS — R0789 Other chest pain: Principal | ICD-10-CM | POA: Insufficient documentation

## 2017-03-04 DIAGNOSIS — J984 Other disorders of lung: Secondary | ICD-10-CM | POA: Insufficient documentation

## 2017-03-04 DIAGNOSIS — R918 Other nonspecific abnormal finding of lung field: Secondary | ICD-10-CM | POA: Insufficient documentation

## 2017-03-04 DIAGNOSIS — R0602 Shortness of breath: Secondary | ICD-10-CM | POA: Diagnosis not present

## 2017-03-04 DIAGNOSIS — F988 Other specified behavioral and emotional disorders with onset usually occurring in childhood and adolescence: Secondary | ICD-10-CM | POA: Diagnosis not present

## 2017-03-04 DIAGNOSIS — Z7982 Long term (current) use of aspirin: Secondary | ICD-10-CM | POA: Insufficient documentation

## 2017-03-04 DIAGNOSIS — Z9889 Other specified postprocedural states: Secondary | ICD-10-CM | POA: Diagnosis present

## 2017-03-04 DIAGNOSIS — K219 Gastro-esophageal reflux disease without esophagitis: Secondary | ICD-10-CM | POA: Insufficient documentation

## 2017-03-04 LAB — I-STAT TROPONIN, ED
TROPONIN I, POC: 0.01 ng/mL (ref 0.00–0.08)
Troponin i, poc: 0 ng/mL (ref 0.00–0.08)

## 2017-03-04 LAB — COMPREHENSIVE METABOLIC PANEL
ALBUMIN: 4.1 g/dL (ref 3.5–5.0)
ALT: 16 U/L (ref 14–54)
ANION GAP: 9 (ref 5–15)
AST: 22 U/L (ref 15–41)
Alkaline Phosphatase: 67 U/L (ref 38–126)
BUN: 11 mg/dL (ref 6–20)
CHLORIDE: 101 mmol/L (ref 101–111)
CO2: 26 mmol/L (ref 22–32)
Calcium: 9.3 mg/dL (ref 8.9–10.3)
Creatinine, Ser: 0.83 mg/dL (ref 0.44–1.00)
GFR calc Af Amer: >60 mL/min (ref >60)
GFR calc non Af Amer: >60 mL/min (ref >60)
GLUCOSE: 105 mg/dL — AB (ref 65–99)
POTASSIUM: 3.6 mmol/L (ref 3.5–5.1)
SODIUM: 136 mmol/L (ref 135–145)
TOTAL PROTEIN: 7.1 g/dL (ref 6.5–8.1)
Total Bilirubin: 1.7 mg/dL — ABNORMAL HIGH (ref 0.3–1.2)

## 2017-03-04 LAB — CBC WITH DIFFERENTIAL/PLATELET
BASOS ABS: 0 10*3/uL (ref 0.0–0.1)
BASOS PCT: 1 %
EOS ABS: 0.1 10*3/uL (ref 0.0–0.7)
Eosinophils Relative: 1 %
HCT: 41.5 % (ref 36.0–46.0)
Hemoglobin: 14.5 g/dL (ref 12.0–15.0)
Lymphocytes Relative: 28 %
Lymphs Abs: 1.8 10*3/uL (ref 0.7–4.0)
MCH: 29.7 pg (ref 26.0–34.0)
MCHC: 34.9 g/dL (ref 30.0–36.0)
MCV: 84.9 fL (ref 78.0–100.0)
MONO ABS: 0.5 10*3/uL (ref 0.1–1.0)
Monocytes Relative: 7 %
Neutro Abs: 4.1 10*3/uL (ref 1.7–7.7)
Neutrophils Relative %: 63 %
PLATELETS: 166 10*3/uL (ref 150–400)
RBC: 4.89 MIL/uL (ref 3.87–5.11)
RDW: 13.1 % (ref 11.5–15.5)
WBC: 6.5 10*3/uL (ref 4.0–10.5)

## 2017-03-04 LAB — BRAIN NATRIURETIC PEPTIDE: B Natriuretic Peptide: 43.1 pg/mL (ref 0.0–100.0)

## 2017-03-04 MED ORDER — ONDANSETRON HCL 4 MG/2ML IJ SOLN: 4.0000 mg | Freq: Four times a day (QID) | INTRAMUSCULAR | Status: DC | PRN

## 2017-03-04 MED ORDER — ASPIRIN 81 MG PO CHEW
81.0000 mg | CHEWABLE_TABLET | Freq: Every day | ORAL | Status: DC
  Administered 2017-03-04 – 2017-03-05 (×2): 81 mg via ORAL
  Filled 2017-03-04 (×2): qty 1

## 2017-03-04 MED ORDER — SODIUM CHLORIDE 0.9 % IV SOLN
INTRAVENOUS | Status: AC
  Administered 2017-03-04: 16:00:00 via INTRAVENOUS

## 2017-03-04 MED ORDER — ROSUVASTATIN CALCIUM 20 MG PO TABS
20.0000 mg | ORAL_TABLET | Freq: Once | ORAL | Status: DC
  Filled 2017-03-04: qty 1

## 2017-03-04 MED ORDER — ASPIRIN 81 MG PO CHEW
81.0000 mg | CHEWABLE_TABLET | Freq: Once | ORAL | Status: AC
  Administered 2017-03-04: 81 mg via ORAL
  Filled 2017-03-04: qty 1

## 2017-03-04 MED ORDER — PANTOPRAZOLE SODIUM 40 MG PO TBEC
40.0000 mg | DELAYED_RELEASE_TABLET | Freq: Every day | ORAL | Status: DC
  Filled 2017-03-04: qty 1

## 2017-03-04 MED ORDER — ACETAMINOPHEN 325 MG PO TABS: 650.0000 mg | ORAL_TABLET | ORAL | Status: DC | PRN

## 2017-03-04 MED ORDER — VITAMIN D3 25 MCG (1000 UNIT) PO TABS
5000.0000 [IU] | ORAL_TABLET | Freq: Every day | ORAL | Status: DC
  Administered 2017-03-04 – 2017-03-05 (×2): 5000 [IU] via ORAL
  Filled 2017-03-04 (×3): qty 5

## 2017-03-04 MED ORDER — ASPIRIN 81 MG PO CHEW
324.0000 mg | CHEWABLE_TABLET | Freq: Once | ORAL | Status: AC
  Administered 2017-03-04: 324 mg via ORAL
  Filled 2017-03-04: qty 4

## 2017-03-04 MED ORDER — NITROGLYCERIN 0.4 MG SL SUBL
0.4000 mg | SUBLINGUAL_TABLET | SUBLINGUAL | Status: DC | PRN
  Administered 2017-03-04: 0.4 mg via SUBLINGUAL
  Filled 2017-03-04: qty 1

## 2017-03-04 MED ORDER — ENOXAPARIN SODIUM 40 MG/0.4ML ~~LOC~~ SOLN
40.0000 mg | SUBCUTANEOUS | Status: DC
  Filled 2017-03-04: qty 0.4

## 2017-03-04 MED ORDER — VITAMIN B-12 1000 MCG PO TABS
1000.0000 ug | ORAL_TABLET | Freq: Every day | ORAL | Status: DC
  Administered 2017-03-04 – 2017-03-05 (×2): 1000 ug via ORAL
  Filled 2017-03-04 (×2): qty 1

## 2017-03-04 NOTE — ED Notes (Signed)
Assigned [id number] @ 14:03 call report @ 14:23

## 2017-03-04 NOTE — ED Triage Notes (Signed)
Patient reports that she has been having issues with BP being elevated this week.  Reports central chest tightness and belching this morning. Pt reports PMH strokes.

## 2017-03-04 NOTE — ED Provider Notes (Signed)
[facility] DEPT Provider Note   CSN: [id number] Arrival date & time: 03/04/17  3151     History   Chief Complaint Chief Complaint  Patient presents with  . Chest Pain    HPI Stephanie Bell is a 76 y.o. female.  HPI   Tightness for the last few days, worsened over yesterday and today.  This morning had belching and does not normally belch.  Middle of chest. No radiation. Difficult to walk due to stroke and not sure if exertion makes it worse. Not positional or pleuritic. Denies nausea. Has had indigestion. Has had some shortness of breath, up and down since 3AM. Not sure if it got better sitting up.  Reports feels improved now in terms of dyspnea.  Is still having tightness in chest 2/10.  No diaphoresis.      Has hx of stroke, check blood pressures at home. For the last few weeks noted that blood pressures were varying from 185/110 and then would be down to normal hour or two later   Past Medical History:  Diagnosis Date  . ADD (attention deficit disorder)   . Esophageal stricture 11/04/2014  . GERD (gastroesophageal reflux disease)   . H/O hiatal hernia   . Hx: UTI (urinary tract infection)   . Interstitial cystitis   . Kidney stone   . Paraesophageal hernia   . Pneumonia 05-09-11   2011 last time  . Stroke ([place])   . Weight loss   . Wound disruption, post-op, skin   . 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[place]) 06/01/2015  . CVA (cerebral infarction) 05/14/2015  . Chest tightness 02/21/2015  . Elevated troponin 01/22/2015  . Esophageal stricture 11/04/2014  . GERD (gastroesophageal reflux disease) 11/04/2014  . Nausea without vomiting 11/04/2014  . Belching 11/04/2014  . 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 Sebaceous cyst of breast 12/20/2011  . S/P Nissen fundoplication (without gastrostomy tube) procedure  06/08/2011    Past Surgical History:  Procedure Laterality Date  . BACK SURGERY  05-09-11   x 2 lumbar  . CHOLECYSTECTOMY  05-09-11   laparoscopic  . COLONOSCOPY  2002, 2008    rectal hyperplastic polyp 2002, diverticulosis 2008  . ESOPHAGOGASTRODUODENOSCOPY    . HERNIA REPAIR  05/16/2011   hiatal hernia  . HIATAL HERNIA REPAIR  02/09/2012   Procedure: LAPAROSCOPIC REPAIR OF HIATAL HERNIA;  Surgeon: Pedro Earls, MD;  [facility]: [facility];  Service: General;  Laterality: N/A;  . INSERTION OF MESH  02/09/2012   Procedure: INSERTION OF MESH;  Surgeon: Pedro Earls, MD;  [facility];  Service: General;;  . IRRIGATION AND DEBRIDEMENT SEBACEOUS CYST  05/16/2011   Procedure: IRRIGATION AND DEBRIDEMENT SEBACEOUS CYST;  Surgeon: Pedro Earls, MD;  Location: [facility];  Service: General;  Laterality: N/A;  middle of chest on sternum  . KIDNEY STONE SURGERY  05-09-11   past hx. and cystoscopy for bladder issues  . LAPAROSCOPIC NISSEN FUNDOPLICATION  7/61/6073   Procedure: LAPAROSCOPIC NISSEN FUNDOPLICATION;  Surgeon: Pedro Earls, MD;  [facility]: [facility];  Service: General;  Laterality: N/A;  will also remove sebaceous cyst of chest wall  . LAPAROSCOPIC NISSEN FUNDOPLICATION  71/08/2692   Procedure: LAPAROSCOPIC NISSEN FUNDOPLICATION;  Surgeon: Pedro Earls, MD;  [facility]: [facility];  Service: General;  Laterality: N/A;  Re-do Laparoscopic Nissen   . SEPTOPLASTY    .  TONSILLECTOMY  05-09-11   Tonsillectomy  . UMBILICAL HERNIA REPAIR  05/16/2011   Procedure: HERNIA REPAIR UMBILICAL ADULT;  Surgeon: Pedro Earls, MD;  [facility]: [facility];  Service: General;  Laterality: N/A;  with mesh   . VAGINAL HYSTERECTOMY      OB History    No data available       Home Medications    Prior to Admission medications   Medication Sig Start Date End Date Taking? Authorizing Provider  aspirin 81 MG chewable tablet Chew 81 mg by mouth daily.    [provider]  Cholecalciferol (VITAMIN  D3) 5000 UNITS CAPS Take 5,000 Units by mouth daily.    [provider]  Cranberry-Vitamin C 84-20 MG CAPS Take 2 capsules by mouth daily.    [provider]  hydrochlorothiazide (HYDRODIURIL) 25 MG tablet Take 1 tablet (25 mg total) by mouth daily. Patient taking differently: Take 12.5 mg by mouth daily.  05/15/15   Henreitta Leber, MD  ibuprofen (ADVIL,MOTRIN) 200 MG tablet Take 200 mg by mouth every 6 (six) hours as needed for headache or mild pain.     [provider]  ondansetron (ZOFRAN-ODT) 4 MG disintegrating tablet Take 1 tablet (4 mg total) by mouth every 8 (eight) hours as needed for nausea or vomiting. 11/04/14   Gatha Mayer, MD  vitamin B-12 (CYANOCOBALAMIN) 1000 MCG tablet Take 1,000 mcg by mouth daily.    [provider]    Family History Family History  Problem Relation Age of Onset  . Ulcers Father   . Alcohol abuse Father   . Alzheimer's disease Mother   . Ulcers Paternal Grandmother   . Colon cancer Neg Hx   . Stroke Neg Hx     Social History Social History   Tobacco Use  . Smoking status: Never Smoker  . Smokeless tobacco: Never Used  Substance Use Topics  . Alcohol use: Yes    Alcohol/week: 0.6 oz    Types: 1 Glasses of wine per week    Comment: Daily 4oz  . Drug use: No     Allergies   Benzocaine; Epinephrine; and Sulfa antibiotics   Review of Systems Review of Systems  Constitutional: Positive for fatigue. Negative for fever.  HENT: Negative for sore throat.   Eyes: Negative for visual disturbance.  Respiratory: Positive for shortness of breath. Negative for cough.   Cardiovascular: Positive for chest pain.  Gastrointestinal: Negative for abdominal pain, nausea and vomiting.  Genitourinary: Negative for difficulty urinating and dysuria.  Musculoskeletal: Negative for back pain and neck pain.  Skin: Negative for rash.  Neurological: Negative for syncope and headaches.     Physical Exam Updated Vital  Signs BP 138/83 (BP Location: Left Arm)   Pulse 87   Temp 97.8 F (36.6 C) (Oral)   Resp 12   Ht 5\' 4"  (1.626 m)   Wt 82.6 kg (182 lb)   SpO2 93%   BMI 31.24 kg/m   Physical Exam  Constitutional: She is oriented to person, place, and time. She appears well-developed and well-nourished. No distress.  HENT:  Head: Normocephalic and atraumatic.  Eyes: Conjunctivae and EOM are normal.  Neck: Normal range of motion.  Cardiovascular: Normal rate, regular rhythm, normal heart sounds and intact distal pulses. Exam reveals no gallop and no friction rub.  No murmur heard. Pulmonary/Chest: Effort normal and breath sounds normal. No respiratory distress. She has no wheezes. She has no rales.  Abdominal: Soft. She exhibits no  distension. There is no tenderness. There is no guarding.  Musculoskeletal: She exhibits no edema or tenderness.  Neurological: She is alert and oriented to person, place, and time.  Skin: Skin is warm and dry. No rash noted. She is not diaphoretic. No erythema.  Nursing note and vitals reviewed.    ED Treatments / Results  Labs (all labs ordered are listed, but only abnormal results are displayed) Labs Reviewed  CBC WITH DIFFERENTIAL/PLATELET  COMPREHENSIVE METABOLIC PANEL  BRAIN NATRIURETIC PEPTIDE  I-STAT TROPONIN, ED    EKG  EKG Interpretation None       Radiology Dg Chest 2 View  Result Date: 03/04/2017 CLINICAL DATA:  Chest discomfort for 2 days EXAM: CHEST  2 VIEW COMPARISON:  02/21/2015 FINDINGS: Normal heart size. Lungs are hyperaerated. Chronic opacity at the left base extends to the left hemidiaphragm. No new pulmonary opacity or consolidation. No pneumothorax or pleural effusion. Stable thoracic spine. IMPRESSION: Chronic left basilar pulmonary opacity.  Hyperaeration is noted. Electronically Signed   By: Marybelle Killings M.D.   On: 03/04/2017 08:55    Procedures Procedures (including critical care time)  Medications Ordered in ED Medications    aspirin chewable tablet 324 mg (not administered)  nitroGLYCERIN (NITROSTAT) SL tablet 0.4 mg (not administered)     Initial Impression / Assessment and Plan / ED Course  I have reviewed the triage vital signs and the nursing notes.  Pertinent labs & imaging results that were available during my care of the patient were reviewed by me and considered in my medical decision making (see chart for details).     76 year old female with a history of CVA, possible hypertension hyperlipidemia for which she used to be on medications as documented by her primary care physician, however has now been controlled, and patient denies history of, presents with concern for chest tightness.  EKG done and evaluated by me and shows no signs of acute ST changes or pericarditis.  Have low suspicion for pulmonary embolus given patient without tachycardia, no risk factors for pulmonary emboli, no dyspnea at this time, no pleuritic pain.  History, XR, exam, not consistent with dissection.  Initial troponin negative.  Patient has a HEART score of at least 5 (not including likely diagnoses of htn, hlpd.)  She has seen Dr. Acie Fredrickson with stress test done in 2016, ECHO and telemetry monitoring in 05/2015.  Chest pain resolved with nitroglycerin.Given aspirin. Will consult Cardiology.  Discussed with Dr. Domenic Polite.  Hospitalist Dr. Maudie Mercury to admit and Cardiology to consult.   Final Clinical Impressions(s) / ED Diagnoses   Final diagnoses:  Chest pain, unspecified type    ED Discharge Orders    None       Gareth Morgan, MD 03/04/17 [phone]

## 2017-03-04 NOTE — ED Notes (Signed)
EKG monitor not working in patient's room. This RN tried changing cord. Will find another way to get 12 lead EKG.

## 2017-03-04 NOTE — ED Notes (Signed)
ED Provider at bedside. 

## 2017-03-04 NOTE — H&P (Addendum)
TRH H&P   Patient Demographics:    Stephanie Bell, is a 76 y.o. female  MRN: [id number]   DOB - December 21, 1940  Admit Date - 03/04/2017  Outpatient Primary MD for the patient is Maryland Pink, MD  Referring MD/NP/PA:  Dr. Billy Fischer  Outpatient Specialists:  Daneen Schick (cardiologist)  Patient coming from: home  Chief Complaint  Patient presents with  . Chest Pain      HPI:    Stephanie Bell  is a 76 y.o. female, w Jerrye Bushy, Esphageal stricture, Zenkers , CVA apparently presents w c/o chest tightness has been going on intermittently >1 week.  Pt states that started this morning at 3am.  The chest pain was substernal , and without radiation.  Slight dyspnea.  Denies fever, chills, cough,  palp, orthopenea, n/v diarrhea, brbpr, black stool  Pt was given nitroglycerin.  The tightness had already pretty much gone when she took, it.  Pt is currently chest pain free.    In ED,   CXR IMPRESSION: Chronic left basilar pulmonary opacity.  Hyperaeration is noted.  Wbc 6.5, hgb 14.5, Plt 166 Na 136, K 3.6,  Bun 11, Creatinine 0.83 Ast 22, Alt 16  BNP 43.1 Trop 0.01  Ekg nsr at 70, lad, early R progression, no st-t changes c/w ischemia  Pt will be admitted for evaluation of chest pain "tightness"      Review of systems:    In addition to the HPI above, No Fever-chills, No Headache, No changes with Vision or hearing, No problems swallowing food or Liquids, No Cough  No Abdominal pain, No Nausea or Vommitting, Bowel movements are regular, No Blood in stool or Urine, No dysuria, No new skin rashes or bruises, No new joints pains-aches,  No new weakness, tingling, numbness in any extremity, No recent weight gain or loss, No polyuria, polydypsia or polyphagia, No significant Mental Stressors.  A full 10 point Review of Systems was done, except as stated above, all other  Review of Systems were negative.   With Past History of the following :    Past Medical History:  Diagnosis Date  . ADD (attention deficit disorder)   . Esophageal stricture 11/04/2014  . GERD (gastroesophageal reflux disease)   . H/O hiatal hernia   . Hx: UTI (urinary tract infection)   . Interstitial cystitis   . Kidney stone   . Paraesophageal hernia   . Pneumonia 05-09-11   2011 last time  . Stroke ([place])   . Weight loss   . Wound disruption, post-op, skin   . 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 BACK SURGERY  05-09-11   x 2 lumbar  . CHOLECYSTECTOMY  05-09-11   laparoscopic  . COLONOSCOPY  2002, 2008    rectal hyperplastic polyp 2002, diverticulosis 2008  . ESOPHAGOGASTRODUODENOSCOPY    .  HERNIA REPAIR  05/16/2011   hiatal hernia  . HIATAL HERNIA REPAIR  02/09/2012   Procedure: LAPAROSCOPIC REPAIR OF HIATAL HERNIA;  Surgeon: Pedro Earls, MD;  Location: [facility];  Service: General;  Laterality: N/A;  . INSERTION OF MESH  02/09/2012   Procedure: INSERTION OF MESH;  Surgeon: Pedro Earls, MD;  Location: [facility];  Service: General;;  . IRRIGATION AND DEBRIDEMENT SEBACEOUS CYST  05/16/2011   Procedure: IRRIGATION AND DEBRIDEMENT SEBACEOUS CYST;  Surgeon: Pedro Earls, MD;  Location: [facility];  Service: General;  Laterality: N/A;  middle of chest on sternum  . KIDNEY STONE SURGERY  05-09-11   past hx. and cystoscopy for bladder issues  . LAPAROSCOPIC NISSEN FUNDOPLICATION  1/76/1607   Procedure: LAPAROSCOPIC NISSEN FUNDOPLICATION;  Surgeon: Pedro Earls, MD;  Location: [facility];  Service: General;  Laterality: N/A;  will also remove sebaceous cyst of chest wall  . LAPAROSCOPIC NISSEN FUNDOPLICATION  37/12/6267   Procedure: LAPAROSCOPIC NISSEN FUNDOPLICATION;  Surgeon: Pedro Earls, MD;  Location: [facility];  Service: General;  Laterality: N/A;  Re-do Laparoscopic Nissen   . SEPTOPLASTY    .  TONSILLECTOMY  05-09-11   Tonsillectomy  . UMBILICAL HERNIA REPAIR  05/16/2011   Procedure: HERNIA REPAIR UMBILICAL ADULT;  Surgeon: Pedro Earls, MD;  [facility]: [facility];  Service: General;  Laterality: N/A;  with mesh   . VAGINAL HYSTERECTOMY        Social History:     Social History   Tobacco Use  . Smoking status: Never Smoker  . Smokeless tobacco: Never Used  Substance Use Topics  . Alcohol use: Yes    Alcohol/week: 0.6 oz    Types: 1 Glasses of wine per week    Comment: Daily 4oz     Lives - at home  Mobility - walks by self   Family History :     Family History  Problem Relation Age of Onset  . Ulcers Father   . Alcohol abuse Father   . Alzheimer's disease Mother   . Ulcers Paternal Grandmother   . Colon cancer Neg Hx   . Stroke Neg Hx       Home Medications:   Prior to Admission medications   Medication Sig Start Date End Date Taking? Authorizing Provider  aspirin 500 MG tablet Take 500 mg by mouth once.   Yes [provider]  aspirin 81 MG chewable tablet Chew 81 mg by mouth daily.   Yes [provider]  Cholecalciferol (VITAMIN D3) 5000 UNITS CAPS Take 5,000 Units by mouth daily.   Yes [provider]  Cranberry-Vitamin C 84-20 MG CAPS Take 2 capsules by mouth daily.   Yes [provider]  ibuprofen (ADVIL,MOTRIN) 200 MG tablet Take 200 mg by mouth every 6 (six) hours as needed for headache or mild pain.    Yes [provider]  Multiple Vitamins-Minerals (MULTIVITAMIN WOMEN 50+) TABS Take 1 tablet by mouth daily.   Yes [provider]  vitamin B-12 (CYANOCOBALAMIN) 1000 MCG tablet Take 1,000 mcg by mouth daily.   Yes [provider]  ondansetron (ZOFRAN-ODT) 4 MG disintegrating tablet Take 1 tablet (4 mg total) by mouth every 8 (eight) hours as needed for nausea or vomiting. 11/04/14   Gatha Mayer, MD     Allergies:     Allergies  Allergen Reactions  . Benzocaine Palpitations     Specific agent was Scandonest 2 %  . Epinephrine Palpitations and  Other (See Comments)    Reaction:  Tachycardia   . Sulfa Antibiotics Swelling and Rash     Physical Exam:   Vitals  Blood pressure 123/77, pulse 62, temperature 97.8 F (36.6 C), temperature source Oral, resp. rate 13, height 5\' 4"  (1.626 m), weight 82.6 kg (182 lb), SpO2 95 %.   1. General  lying in bed in NAD,   2. Normal affect and insight, Not Suicidal or Homicidal, Awake Alert, Oriented X 3.  3. No F.N deficits, ALL C.Nerves Intact, Strength 5/5 all 4 extremities, Sensation intact all 4 extremities, Plantars down going.  4. Ears and Eyes appear Normal, Conjunctivae clear, PERRLA. Moist Oral Mucosa.  5. Supple Neck, No JVD, No cervical lymphadenopathy appriciated, No Carotid Bruits.  6. Symmetrical Chest wall movement, Good air movement bilaterally, CTAB.  7. RRR, No Gallops, Rubs or Murmurs, No Parasternal Heave.  8. Positive Bowel Sounds, Abdomen Soft, No tenderness, No organomegaly appriciated,No rebound -guarding or rigidity.  9.  No Cyanosis, Normal Skin Turgor, No Skin Rash or Bruise.  10. Good muscle tone,  joints appear normal , no effusions, Normal ROM.  11. No Palpable Lymph Nodes in Neck or Axillae     Data Review:    CBC Recent Labs  Lab 03/04/17 0950  WBC 6.5  HGB 14.5  HCT 41.5  PLT 166  MCV 84.9  MCH 29.7  MCHC 34.9  RDW 13.1  LYMPHSABS 1.8  MONOABS 0.5  EOSABS 0.1  BASOSABS 0.0   ------------------------------------------------------------------------------------------------------------------  Chemistries  Recent Labs  Lab 03/04/17 0950  NA 136  K 3.6  CL 101  CO2 26  GLUCOSE 105*  BUN 11  CREATININE 0.83  CALCIUM 9.3  AST 22  ALT 16  ALKPHOS 67  BILITOT 1.7*   ------------------------------------------------------------------------------------------------------------------ estimated creatinine clearance is 59.1 mL/min (by C-G formula based on SCr of 0.83  mg/dL). ------------------------------------------------------------------------------------------------------------------ No results for input(s): TSH, T4TOTAL, T3FREE, THYROIDAB in the last 72 hours.  Invalid input(s): FREET3  Coagulation profile No results for input(s): INR, PROTIME in the last 168 hours. ------------------------------------------------------------------------------------------------------------------- No results for input(s): DDIMER in the last 72 hours. -------------------------------------------------------------------------------------------------------------------  Cardiac Enzymes No results for input(s): CKMB, TROPONINI, MYOGLOBIN in the last 168 hours.  Invalid input(s): CK ------------------------------------------------------------------------------------------------------------------    Component Value Date/Time   BNP 43.1 03/04/2017 0950     ---------------------------------------------------------------------------------------------------------------  Urinalysis    Component Value Date/Time   COLORURINE YELLOW 10/11/2016 0123   APPEARANCEUR HAZY (A) 10/11/2016 0123   APPEARANCEUR Clear 06/05/2011 0251   LABSPEC 1.019 10/11/2016 0123   LABSPEC 1.003 06/05/2011 0251   PHURINE 5.0 10/11/2016 0123   GLUCOSEU NEGATIVE 10/11/2016 0123   GLUCOSEU Negative 06/05/2011 0251   HGBUR SMALL (A) 10/11/2016 0123   BILIRUBINUR NEGATIVE 10/11/2016 0123   BILIRUBINUR Negative 06/05/2011 0251   KETONESUR NEGATIVE 10/11/2016 0123   PROTEINUR NEGATIVE 10/11/2016 0123   UROBILINOGEN 0.2 06/29/2013 2044   NITRITE NEGATIVE 10/11/2016 0123   LEUKOCYTESUR MODERATE (A) 10/11/2016 0123   LEUKOCYTESUR 1+ 06/05/2011 0251    ----------------------------------------------------------------------------------------------------------------   Imaging Results:    Dg Chest 2 View  Result Date: 03/04/2017 CLINICAL DATA:  Chest discomfort for 2 days EXAM: CHEST  2 VIEW  COMPARISON:  02/21/2015 FINDINGS: Normal heart size. Lungs are hyperaerated. Chronic opacity at the left base extends to the left hemidiaphragm. No new pulmonary opacity or consolidation. No pneumothorax or pleural effusion. Stable thoracic spine. IMPRESSION: Chronic left basilar pulmonary opacity.  Hyperaeration is noted. Electronically Signed   By:  Marybelle Killings M.D.   On: 03/04/2017 08:55     Assessment & Plan:    Active Problems:   Chest pain    Chest pain Tele Trop I q6h x3 Check cardiac echoCont aspirin Start Crestor 20mg  po x1 (had difficulty with lipitor in the past) No beta blocker due relative bradycardia Check Lipid Cardiology consult requested by email Appreciate input.    Hyperglycemia Check hga1c     DVT Prophylaxis Lovenox - SCDs  AM Labs Ordered, also please review Full Orders  Family Communication: Admission, patients condition and plan of care including tests being ordered have been discussed with the patient  who indicate understanding and agree with the plan and Code Status.  Code Status FULL CODE  Likely DC to  home  Condition GUARDED    Consults called: none  Admission status: observation   Time spent in minutes : 45   Jani Gravel M.D on 03/04/2017 at 11:31 AM  Between 7am to 7pm - Pager - [phone]  . After 7pm go to [email or web address] - password [facility]  [facility] - Office  [phone]

## 2017-03-05 ENCOUNTER — Encounter (HOSPITAL_COMMUNITY): Payer: Self-pay

## 2017-03-05 ENCOUNTER — Ambulatory Visit (HOSPITAL_BASED_OUTPATIENT_CLINIC_OR_DEPARTMENT_OTHER)
Admission: EM | Admit: 2017-03-05 | Discharge: 2017-03-05 | Disposition: A | Discharge status: Home / Self Care | Payer: PPO | Source: Home / Self Care | Attending: Cardiology | Admitting: Cardiology

## 2017-03-05 ENCOUNTER — Observation Stay (HOSPITAL_BASED_OUTPATIENT_CLINIC_OR_DEPARTMENT_OTHER): Payer: PPO

## 2017-03-05 DIAGNOSIS — I10 Essential (primary) hypertension: Secondary | ICD-10-CM | POA: Diagnosis not present

## 2017-03-05 DIAGNOSIS — R0789 Other chest pain: Secondary | ICD-10-CM

## 2017-03-05 DIAGNOSIS — R079 Chest pain, unspecified: Secondary | ICD-10-CM

## 2017-03-05 DIAGNOSIS — J984 Other disorders of lung: Secondary | ICD-10-CM | POA: Diagnosis not present

## 2017-03-05 LAB — COMPREHENSIVE METABOLIC PANEL
ALBUMIN: 3.4 g/dL — AB (ref 3.5–5.0)
ALK PHOS: 57 U/L (ref 38–126)
ALT: 14 U/L (ref 14–54)
AST: 18 U/L (ref 15–41)
Anion gap: 6 (ref 5–15)
BUN: 13 mg/dL (ref 6–20)
CALCIUM: 8.9 mg/dL (ref 8.9–10.3)
CHLORIDE: 106 mmol/L (ref 101–111)
CO2: 26 mmol/L (ref 22–32)
CREATININE: 0.77 mg/dL (ref 0.44–1.00)
GFR calc non Af Amer: >60 mL/min (ref >60)
GLUCOSE: 95 mg/dL (ref 65–99)
Potassium: 3.6 mmol/L (ref 3.5–5.1)
SODIUM: 138 mmol/L (ref 135–145)
Total Bilirubin: 1.4 mg/dL — ABNORMAL HIGH (ref 0.3–1.2)
Total Protein: 5.9 g/dL — ABNORMAL LOW (ref 6.5–8.1)

## 2017-03-05 LAB — LIPID PANEL
Cholesterol: 199 mg/dL (ref 0–200)
HDL: 49 mg/dL (ref >40)
LDL CALC: 127 mg/dL — AB (ref 0–99)
Total CHOL/HDL Ratio: 4.1 RATIO
Triglycerides: 117 mg/dL (ref <150)
VLDL: 23 mg/dL (ref 0–40)

## 2017-03-05 LAB — ECHOCARDIOGRAM COMPLETE
Height: 65 in
WEIGHTICAEL: 2945.35 [oz_av]

## 2017-03-05 LAB — NM MYOCAR MULTI W/SPECT W/WALL MOTION / EF
Exercise duration (min): 4 min
Rest HR: 65 {beats}/min

## 2017-03-05 MED ORDER — REGADENOSON 0.4 MG/5ML IV SOLN
0.4000 mg | Freq: Once | INTRAVENOUS | Status: AC
  Administered 2017-03-05: 0.4 mg via INTRAVENOUS
  Filled 2017-03-05: qty 5

## 2017-03-05 MED ORDER — TECHNETIUM TC 99M TETROFOSMIN IV KIT
10.0000 | PACK | Freq: Once | INTRAVENOUS | Status: AC | PRN
  Administered 2017-03-05: 10 via INTRAVENOUS

## 2017-03-05 MED ORDER — LOSARTAN POTASSIUM 50 MG PO TABS
50.0000 mg | ORAL_TABLET | Freq: Every day | ORAL | Status: DC
  Administered 2017-03-05: 50 mg via ORAL
  Filled 2017-03-05: qty 1

## 2017-03-05 MED ORDER — TECHNETIUM TC 99M TETROFOSMIN IV KIT
30.0000 | PACK | Freq: Once | INTRAVENOUS | Status: AC | PRN
  Administered 2017-03-05: 30 via INTRAVENOUS

## 2017-03-05 MED ORDER — LOSARTAN POTASSIUM 50 MG PO TABS: 50.0000 mg | ORAL_TABLET | Freq: Every day | ORAL | 0 refills | Status: DC

## 2017-03-05 MED ORDER — REGADENOSON 0.4 MG/5ML IV SOLN
INTRAVENOUS | Status: AC
  Filled 2017-03-05: qty 5

## 2017-03-05 NOTE — Consult Note (Signed)
Cardiology Consultation:   Patient ID: Stephanie Bell; [id number]; 04-09-1940   Admit date: 03/04/2017 Date of Consult: 03/05/2017  Primary Care Provider: Maryland Pink, MD Primary Cardiologist: Dr. Tamala Julian  Patient Profile:   Stephanie Bell is a 76 y.o. female with a hx of GERD, esophageal stricture- Zenkers, CVA in 2017 who is being seen today for the evaluation of chest pain at the request of Dr. Clementeen Graham.  History of Present Illness:   Ms. Bottomley had been noticing a fluctuating blood pressure over the past couple of weeks with intermittently high readings then normal readings 30 minutes later. About 2 days ago she began to have intermittent chest tightness and "just not feeling right". This discomfort is not related to activity and has occurred while she is in bed at 3 am and often coincides with elevated BP. She has mild shortness of breath with it that she describes as having to take a deep breath- she demonstates a sigh. This does not feel like her previous discomfort with esophageal spasms. She also notes some feelings of indigestion and belching that doe not necessarily come after eating. She denies orthpnea, PND, edema, palpitations, dizziness, syncope/near syncope.  Mrs. Siwik is fairly active. She and her husband still play tennis twice a week and did so last week. She denies any exertional chest discomfort or dyspnea with playing tennis, walking up stairs or doing housework.   In 2016 she had a mild troponin leak in the past related to lidocaine and epinephrine used during a dental procedure. She had a low risk myoview in 01/2015. She has had a stroke in 04/2015. She did not have a TEE. She was put on plavix and atorvastatin. She wore a monitor for 2 weeks with no evidence of atrial fib. She was followed by neurology who's opinion was that if she controls her BP and cholesterol that she is unlikely to have another stroke. She had a carotid doppler study in 04/2015 that shoed no  hemodynamically significant stenosis.An echo in 04/2015 showed normal LV function with EF 60-65% and mild MR.   About 4 months after her starting all of the meds for stroke she developed lethargy and subsequently stopped the Plavix, antihypertensive and statin. She felt better.  Past Medical History:  Diagnosis Date  . ADD (attention deficit disorder)   . Esophageal stricture 11/04/2014  . GERD (gastroesophageal reflux disease)   . H/O hiatal hernia   . Hx: UTI (urinary tract infection)   . Interstitial cystitis   . Kidney stone   . Paraesophageal hernia   . Pneumonia 05-09-11   2011 last time  . Stroke ([place])   . Weight loss   . Wound disruption, post-op, skin   . 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 BACK SURGERY  05-09-11   x 2 lumbar  . CHOLECYSTECTOMY  05-09-11   laparoscopic  . COLONOSCOPY  2002, 2008    rectal hyperplastic polyp 2002, diverticulosis 2008  . ESOPHAGOGASTRODUODENOSCOPY    . HERNIA REPAIR  05/16/2011   hiatal hernia  . HIATAL HERNIA REPAIR  02/09/2012   Procedure: LAPAROSCOPIC REPAIR OF HIATAL HERNIA;  Surgeon: Pedro Earls, MD;  [facility]: [facility];  Service: General;  Laterality: N/A;  . INSERTION OF MESH  02/09/2012   Procedure: INSERTION OF MESH;  Surgeon: Pedro Earls, MD;  Location: [facility];  Service: General;;  . IRRIGATION AND DEBRIDEMENT SEBACEOUS CYST  05/16/2011   Procedure: IRRIGATION  AND DEBRIDEMENT SEBACEOUS CYST;  Surgeon: Pedro Earls, MD;  Location: [facility];  Service: General;  Laterality: N/A;  middle of chest on sternum  . KIDNEY STONE SURGERY  05-09-11   past hx. and cystoscopy for bladder issues  . LAPAROSCOPIC NISSEN FUNDOPLICATION  6/75/9163   Procedure: LAPAROSCOPIC NISSEN FUNDOPLICATION;  Surgeon: Pedro Earls, MD;  [facility];  Service: General;  Laterality: N/A;  will also remove sebaceous cyst of chest wall  . LAPAROSCOPIC NISSEN FUNDOPLICATION   84/66/5993   Procedure: LAPAROSCOPIC NISSEN FUNDOPLICATION;  Surgeon: Pedro Earls, MD;  [facility];  Service: General;  Laterality: N/A;  Re-do Laparoscopic Nissen   . SEPTOPLASTY    . TONSILLECTOMY  05-09-11   Tonsillectomy  . UMBILICAL HERNIA REPAIR  05/16/2011   Procedure: HERNIA REPAIR UMBILICAL ADULT;  Surgeon: Pedro Earls, MD;  [facility];  Service: General;  Laterality: N/A;  with mesh   . VAGINAL HYSTERECTOMY       Home Medications:  Prior to Admission medications   Medication Sig Start Date End Date Taking? Authorizing Provider  aspirin 500 MG tablet Take 500 mg by mouth once.   Yes [provider]  aspirin 81 MG chewable tablet Chew 81 mg by mouth daily.   Yes [provider]  Cholecalciferol (VITAMIN D3) 5000 UNITS CAPS Take 5,000 Units by mouth daily.   Yes [provider]  Cranberry-Vitamin C 84-20 MG CAPS Take 2 capsules by mouth daily.   Yes [provider]  ibuprofen (ADVIL,MOTRIN) 200 MG tablet Take 200 mg by mouth every 6 (six) hours as needed for headache or mild pain.    Yes [provider]  Multiple Vitamins-Minerals (MULTIVITAMIN WOMEN 50+) TABS Take 1 tablet by mouth daily.   Yes [provider]  vitamin B-12 (CYANOCOBALAMIN) 1000 MCG tablet Take 1,000 mcg by mouth daily.   Yes [provider]  ondansetron (ZOFRAN-ODT) 4 MG disintegrating tablet Take 1 tablet (4 mg total) by mouth every 8 (eight) hours as needed for nausea or vomiting. 11/04/14   Gatha Mayer, MD    Inpatient Medications: Scheduled Meds: . aspirin  81 mg Oral Daily  . cholecalciferol  5,000 Units Oral Daily  . enoxaparin (LOVENOX) injection  40 mg Subcutaneous Q24H  . pantoprazole  40 mg Oral Daily  . rosuvastatin  20 mg Oral Once  . vitamin B-12  1,000 mcg Oral Daily   Continuous Infusions:  PRN Meds: acetaminophen, nitroGLYCERIN, ondansetron (ZOFRAN) IV  Allergies:    Allergies  Allergen Reactions  .  Benzocaine Palpitations    Specific agent was Scandonest 2 %  . Epinephrine Palpitations and Other (See Comments)    Reaction:  Tachycardia   . Sulfa Antibiotics Swelling and Rash    Social History:   Social History   Socioeconomic History  . Marital status: Married    Spouse name: [name]  . Number of children: 3  . Years of education: College  . Highest education level: Not on file  Social Needs  . Financial resource strain: Not on file  . Food insecurity - worry: Not on file  . Food insecurity - inability: Not on file  . Transportation needs - medical: Not on file  . Transportation needs - non-medical: Not on file  Occupational History  . Not on file  Tobacco Use  . Smoking status: Never Smoker  . Smokeless tobacco: Never Used  Substance and Sexual Activity  . Alcohol use: Yes  Alcohol/week: 0.6 oz    Types: 1 Glasses of wine per week    Comment: Daily 4oz  . Drug use: No  . Sexual activity: Yes  Other Topics Concern  . Not on file  Social History Narrative   Lives with husband.   Caffeine use: none    Family History:    Family History  Problem Relation Age of Onset  . Ulcers Father   . Alcohol abuse Father   . Alzheimer's disease Mother   . Ulcers Paternal Grandmother   . Colon cancer Neg Hx   . Stroke Neg Hx      ROS:  Please see the history of present illness.  ROS  All other ROS reviewed and negative.     Physical Exam/Data:   Vitals:   03/04/17 1400 03/04/17 1528 03/04/17 2144 03/05/17 0446  BP: 111/65 115/61 (!) 168/86 130/82  Pulse: 79 66 67 67  Resp: 18 18 18 18   Temp:  (!) 97.1 F (36.2 C) (!) 97.4 F (36.3 C) (!) 97.3 F (36.3 C)  TempSrc:  Oral Oral Oral  SpO2: 97% 97% 97% 97%  Weight:  184 lb 1.4 oz (83.5 kg)    Height:  5\' 5"  (1.651 m)     No intake or output data in the 24 hours ending 03/05/17 0736 Filed Weights   03/04/17 0823 03/04/17 1528  Weight: 182 lb (82.6 kg) 184 lb 1.4 oz (83.5 kg)   Body mass index is 30.63  kg/m.  General:  Well nourished, well developed, in no acute distress HEENT: normal Lymph: no adenopathy Neck: no JVD Endocrine:  No thryomegaly Vascular: No carotid bruits; FA pulses 2+ bilaterally without bruits  Cardiac:  normal S1, S2; RRR; no murmur, gallop present Lungs:  clear to auscultation bilaterally, no wheezing, rhonchi or rales  Abd: soft, nontender, no hepatomegaly  Ext: no edema Musculoskeletal:  No deformities, BUE and BLE strength normal and equal Skin: warm and dry  Neuro:  CNs 2-12 intact, no focal abnormalities noted Psych:  Normal affect   EKG:  The EKG was personally reviewed and demonstrates:  NSR 72 bpm, poor r wave progression, LAFB, no significant changes from previous Telemetry:  Telemetry was personally reviewed and demonstrates:  Sinus rhythm in the 60's with rare PVC  Relevant CV Studies:  Echocardiogram 05/15/2015 Study Conclusions  - Left ventricle: The cavity size was normal. Systolic function was   normal. The estimated ejection fraction was in the range of 60%   to 65%. - Aortic valve: Valve area (Vmax): 1.53 cm^2. - Mitral valve: There was mild regurgitation.  Laboratory Data:  Chemistry Recent Labs  Lab 03/04/17 0950 03/05/17 0452  NA 136 138  K 3.6 3.6  CL 101 106  CO2 26 26  GLUCOSE 105* 95  BUN 11 13  CREATININE 0.83 0.77  CALCIUM 9.3 8.9  GFRNONAA >60 >60  GFRAA >60 >60  ANIONGAP 9 6    Recent Labs  Lab 03/04/17 0950 03/05/17 0452  PROT 7.1 5.9*  ALBUMIN 4.1 3.4*  AST 22 18  ALT 16 14  ALKPHOS 67 57  BILITOT 1.7* 1.4*   Hematology Recent Labs  Lab 03/04/17 0950  WBC 6.5  RBC 4.89  HGB 14.5  HCT 41.5  MCV 84.9  MCH 29.7  MCHC 34.9  RDW 13.1  PLT 166   Cardiac EnzymesNo results for input(s): TROPONINI in the last 168 hours.  Recent Labs  Lab 03/04/17 0958 03/04/17 1249  TROPIPOC 0.01 0.00  BNP Recent Labs  Lab 03/04/17 0950  BNP 43.1    DDimer No results for input(s): DDIMER in the last  168 hours.  Radiology/Studies:  Dg Chest 2 View  Result Date: 03/04/2017 CLINICAL DATA:  Chest discomfort for 2 days EXAM: CHEST  2 VIEW COMPARISON:  02/21/2015 FINDINGS: Normal heart size. Lungs are hyperaerated. Chronic opacity at the left base extends to the left hemidiaphragm. No new pulmonary opacity or consolidation. No pneumothorax or pleural effusion. Stable thoracic spine. IMPRESSION: Chronic left basilar pulmonary opacity.  Hyperaeration is noted. Electronically Signed   By: Marybelle Killings M.D.   On: 03/04/2017 08:55    Assessment and Plan:   1. Chest pain -Fairly atypical chest tightness no associated with activity or any other significant symptoms. No S/S of heart failure. -Troponins negative X2 -BNP 43.1, normal -No significant changes in EKG -CXR nonacute -Plan for nuclear stress test today to further evaluate for myocardial ischemia.  2. Hyperlipidemia -LDL in the 130's in 2017. Pt was placed on atorvastatin after her stroke in 2017, but had trouble with it. Is started on Crestor here.   3. Hypertension -BP is variable, mostly controlled with some documented elevations.  -Had cough with ACE-I in the past per notes- however, pt does not recall taking this. She was on HCTZ in 2017 and developed lethargy and lack of energy. -Consider low dose ARB and avoid HCTZ.   For questions or updates, please contact [place] Please consult [email or web address] for contact info under Cardiology/STEMI.   Signed, Daune Perch, NP  03/05/2017 7:36 AM

## 2017-03-05 NOTE — Progress Notes (Signed)
1 min, Stephanie Bell at bedside. Pt reports SOB and feeling flushed.

## 2017-03-05 NOTE — Progress Notes (Signed)
Pt tolerated Lexiscan well, final report pending.  Kerin Ransom PA-C 03/05/2017 12:00 PM

## 2017-03-05 NOTE — Progress Notes (Signed)
  Echocardiogram 2D Echocardiogram has been performed.  Darlina Sicilian M 03/05/2017, 3:08 PM

## 2017-03-05 NOTE — Progress Notes (Signed)
Patient D/C'd. Patient educated on discharge summery along with husband. All questions answered. Summery given. Patient wheeled out by nurse tech in Wheelchair.

## 2017-03-05 NOTE — Discharge Instructions (Signed)

## 2017-03-05 NOTE — Progress Notes (Signed)
Pre procedure VS

## 2017-03-05 NOTE — Progress Notes (Signed)
   Stress test is normal with no ischemia and normal LV function. No need for further cardiac workup. Consider other causes of her chest pain. Cardiology will sign off.   Daune Perch, AGNP-C [facility] 03/05/2017  3:08 PM Pager: [phone]

## 2017-03-05 NOTE — Progress Notes (Signed)
5 mins, procedure ended. Stephanie Bell at bedside. Pt denies CP/SOB or "flushing"

## 2017-03-05 NOTE — Discharge Summary (Signed)
Physician Discharge Summary  Stephanie Bell YSA:[id number] DOB: 1940-11-27 DOA: 03/04/2017  PCP: Maryland Pink, MD  Admit date: 03/04/2017 Discharge date: 03/05/2017  Admitted From: Home Disposition:  Home  Recommendations for Outpatient Follow-up:  1. Follow up with PCP in 1-2 weeks 2. Patient instructed to keep a log of her blood pressure recording and show it to her PCP. Please follow 2-D echo results as outpatient.  Home Health: None Equipment/Devices: None  Discharge Condition: Fair CODE STATUS: Full code Diet recommendation: Heart healthy    Discharge Diagnoses:  Principal Problem:   Chest pain, atypical  Active Problems:   S/P Nissen fundoplication (without gastrostomy tube) procedure   Essential hypertension   Brief narrative/history of present illness 76 year old female with history of GERD, history of recent stricture, history of CVA presented with intermittent chest tightness that occurred while at the church, substernal in location without any radiation and associated with some shortness of breath. Denied any palpitations, fevers, chills, headache, dizziness, orthopnea, PND, nausea, vomiting, abdominal pain, bowel or urinary symptoms. Patient was given nitroglycerin in the ED. EKG and 2 sets of troponin were negative. Patient remained chest pain-free after that. Patient also reports fluctuating blood pressure with episodes of normal and Vitals in the ED showed mildly elevated blood pressure, labs were unremarkable. Patient placed on observation for chest pain rule out.  Hospital course Chest pain Appears atypical and likely musculoskeletal. Remains chest pain-free. Serial troponins have been negative. Stable on telemetry. Cardiology consult appreciated and patient underwent pharmacological stress test which showed no reversible ischemia or infarction and was a low-risk study. LVEF of 73%. 2-D echo being done and results can be followed as  outpatient.  Essential hypertension Patient reporting labile blood pressure at home. Started on ARB. Patient also instructed to keep a log of her blood pressure recording and short PCP during outpatient follow-up.  History of CVA Patient did not tolerate Lipitor in the past. LDL of 127. Would recommend starting her on different lipid-lowering agent as outpatient and monitor.  Consults: cardiology  Family communication: Husband at bedtime  Disposition: Home  Discharge Instructions   Allergies as of 03/05/2017      Reactions   Benzocaine Palpitations   Specific agent was Scandonest 2 %   Epinephrine Palpitations, Other (See Comments)   Reaction:  Tachycardia    Sulfa Antibiotics Swelling, Rash      Medication List    TAKE these medications   aspirin 81 MG chewable tablet Chew 81 mg by mouth daily. What changed:  Another medication with the same name was removed. Continue taking this medication, and follow the directions you see here.   Cranberry-Vitamin C 84-20 MG Caps Take 2 capsules by mouth daily.   ibuprofen 200 MG tablet Commonly known as:  ADVIL,MOTRIN Take 200 mg by mouth every 6 (six) hours as needed for headache or mild pain.   losartan 50 MG tablet Commonly known as:  COZAAR Take 1 tablet (50 mg total) by mouth daily. Start taking on:  03/06/2017   MULTIVITAMIN WOMEN 50+ Tabs Take 1 tablet by mouth daily.   ondansetron 4 MG disintegrating tablet Commonly known as:  ZOFRAN-ODT Take 1 tablet (4 mg total) by mouth every 8 (eight) hours as needed for nausea or vomiting.   vitamin B-12 1000 MCG tablet Commonly known as:  CYANOCOBALAMIN Take 1,000 mcg by mouth daily.   Vitamin D3 5000 units Caps Take 5,000 Units by mouth daily.      Follow-up Information  Maryland Pink, MD. Schedule an appointment as soon as possible for a visit in 1 week(s).   Specialty:  Family Medicine Contact information: [address] [place] [address] [phone]           Allergies  Allergen Reactions  . Benzocaine Palpitations    Specific agent was Scandonest 2 %  . Epinephrine Palpitations and Other (See Comments)    Reaction:  Tachycardia   . Sulfa Antibiotics Swelling and Rash        Procedures/Studies: Dg Chest 2 View  Result Date: 03/04/2017 CLINICAL DATA:  Chest discomfort for 2 days EXAM: CHEST  2 VIEW COMPARISON:  02/21/2015 FINDINGS: Normal heart size. Lungs are hyperaerated. Chronic opacity at the left base extends to the left hemidiaphragm. No new pulmonary opacity or consolidation. No pneumothorax or pleural effusion. Stable thoracic spine. IMPRESSION: Chronic left basilar pulmonary opacity.  Hyperaeration is noted. Electronically Signed   By: Marybelle Killings M.D.   On: 03/04/2017 08:55   Nm Myocar Multi W/spect W/wall Motion / Ef  Result Date: 03/05/2017 CLINICAL DATA:  Stroke lung disease.  Coronary artery disease. EXAM: MYOCARDIAL IMAGING WITH SPECT (REST AND PHARMACOLOGIC-STRESS) GATED LEFT VENTRICULAR WALL MOTION STUDY LEFT VENTRICULAR EJECTION FRACTION TECHNIQUE: Standard myocardial SPECT imaging was performed after resting intravenous injection of 10 mCi Tc-17m tetrofosmin. Subsequently, intravenous infusion of Lexiscan was performed under the supervision of the Cardiology staff. At peak effect of the drug, 30 mCi Tc-71m tetrofosmin was injected intravenously and standard myocardial SPECT imaging was performed. Quantitative gated imaging was also performed to evaluate left ventricular wall motion, and estimate left ventricular ejection fraction. COMPARISON:  None. FINDINGS: Perfusion: No decreased activity in the left ventricle on stress imaging to suggest reversible ischemia or infarction. Wall Motion: Normal left ventricular wall motion. No left ventricular dilation. Left Ventricular Ejection Fraction: 73 % End diastolic volume 69 ml End systolic volume 18 ml IMPRESSION: 1. No reversible ischemia or infarction. 2. Normal left  ventricular wall motion. 3. Left ventricular ejection fraction 73% 4. Non invasive risk stratification*: Low *2012 Appropriate Use Criteria for Coronary Revascularization Focused Update: J Am Coll Cardiol. 4854;62(7):035-009. http://[email or web address]?[email or web address] Electronically Signed   By: Van Clines M.D.   On: 03/05/2017 14:34       Subjective: No further chest pain symptoms.  Discharge Exam: Vitals:   03/05/17 1152 03/05/17 1400  BP: (!) 158/79 106/86  Pulse:  77  Resp:  18  Temp:  97.8 F (36.6 C)  SpO2:  97%   Vitals:   03/05/17 1148 03/05/17 1150 03/05/17 1152 03/05/17 1400  BP: (!) 164/95 (!) 144/80 (!) 158/79 106/86  Pulse:    77  Resp:    18  Temp:    97.8 F (36.6 C)  TempSrc:    Oral  SpO2:    97%  Weight:      Height:        General: Elderly female not in distress HEENT: Moist mucosa, supple neck Chest: Clear to auscultation bilaterally, no added sounds CVS: Normal S1 and S2, no murmurs rub or gallop GI: Soft, nondistended, nontender musculoskeletal: Warm, no edema     The results of significant diagnostics from this hospitalization (including imaging, microbiology, ancillary and laboratory) are listed below for reference.     Microbiology: No results found for this or any previous visit (from the past 240 hour(s)).   Labs: BNP (last 3 results) Recent Labs    03/04/17 0950  BNP 38.1   Basic Metabolic Panel: Recent  Labs  Lab 03/04/17 0950 03/05/17 0452  NA 136 138  K 3.6 3.6  CL 101 106  CO2 26 26  GLUCOSE 105* 95  BUN 11 13  CREATININE 0.83 0.77  CALCIUM 9.3 8.9   Liver Function Tests: Recent Labs  Lab 03/04/17 0950 03/05/17 0452  AST 22 18  ALT 16 14  ALKPHOS 67 57  BILITOT 1.7* 1.4*  PROT 7.1 5.9*  ALBUMIN 4.1 3.4*   No results for input(s): LIPASE, AMYLASE in the last 168 hours. No results for input(s): AMMONIA in the last 168 hours. CBC: Recent Labs  Lab 03/04/17 0950  WBC 6.5   NEUTROABS 4.1  HGB 14.5  HCT 41.5  MCV 84.9  PLT 166   Cardiac Enzymes: No results for input(s): CKTOTAL, CKMB, CKMBINDEX, TROPONINI in the last 168 hours. BNP: Invalid input(s): POCBNP CBG: No results for input(s): GLUCAP in the last 168 hours. D-Dimer No results for input(s): DDIMER in the last 72 hours. Hgb A1c No results for input(s): HGBA1C in the last 72 hours. Lipid Profile Recent Labs    03/05/17 0452  CHOL 199  HDL 49  LDLCALC 127*  TRIG 117  CHOLHDL 4.1   Thyroid function studies No results for input(s): TSH, T4TOTAL, T3FREE, THYROIDAB in the last 72 hours.  Invalid input(s): FREET3 Anemia work up No results for input(s): VITAMINB12, FOLATE, FERRITIN, TIBC, IRON, RETICCTPCT in the last 72 hours. Urinalysis    Component Value Date/Time   COLORURINE YELLOW 10/11/2016 0123   APPEARANCEUR HAZY (A) 10/11/2016 0123   APPEARANCEUR Clear 06/05/2011 0251   LABSPEC 1.019 10/11/2016 0123   LABSPEC 1.003 06/05/2011 0251   PHURINE 5.0 10/11/2016 0123   GLUCOSEU NEGATIVE 10/11/2016 0123   GLUCOSEU Negative 06/05/2011 0251   HGBUR SMALL (A) 10/11/2016 0123   BILIRUBINUR NEGATIVE 10/11/2016 0123   BILIRUBINUR Negative 06/05/2011 0251   KETONESUR NEGATIVE 10/11/2016 0123   PROTEINUR NEGATIVE 10/11/2016 0123   UROBILINOGEN 0.2 06/29/2013 2044   NITRITE NEGATIVE 10/11/2016 0123   LEUKOCYTESUR MODERATE (A) 10/11/2016 0123   LEUKOCYTESUR 1+ 06/05/2011 0251   Sepsis Labs Invalid input(s): PROCALCITONIN,  WBC,  LACTICIDVEN Microbiology No results found for this or any previous visit (from the past 240 hour(s)).   Time coordinating discharge: < 30 minutes  SIGNED:   Louellen Molder, MD  [facility] 03/05/2017, 2:54 PM Pager   If 7PM-7AM, please contact night-coverage [email or web address] Password [email or web address]

## 2017-03-05 NOTE — Progress Notes (Signed)
3 mins, Luke PA at bedside. Pt reports flushing and SOB has passed

## 2017-03-06 LAB — HEMOGLOBIN A1C
Hgb A1c MFr Bld: 5.5 % (ref 4.8–5.6)
Mean Plasma Glucose: 111 mg/dL

## 2017-03-26 DIAGNOSIS — R0789 Other chest pain: Secondary | ICD-10-CM | POA: Diagnosis not present

## 2017-03-28 ENCOUNTER — Telehealth: Payer: Self-pay | Admitting: Interventional Cardiology

## 2017-03-28 NOTE — Telephone Encounter (Signed)
Pt with chest tightness off and on since 03/04/17 when she went to ER.  Pt states this only occurs in AM when she first wakes up.  Once she gets up and starts moving around sx resolves.  Pt seen by PCP recently and he told her it sounded like she was having panic attacks.  Prescribed Celexa and Xanax.  Pt took these and woke up feeling terrible so PCP d/c'ed and pt now on Zoloft.  BP has been fluctuating.  Pt gave example of 157/119 and then 30 mins later it was back to normal 130's/70's.  Pt went to ER on 12/16 d/t elevated BP and dyspnea.  Pt denies using Nitro anytime she has one of these episodes because they resolve so quickly after getting up.  Pt concerned about taking the medications prescribed by PCP d/t her heart.  Pt also concerned if she is having panic attacks and this causing further heart issues.  Pt wanted to get Dr. Thompson Caul opinion.  Advised I would send message to Dr. Tamala Julian for review.

## 2017-03-28 NOTE — Telephone Encounter (Signed)
New message  Patient calling with concerns about chest tightness/ pain. Please call  Pt c/o of Chest Pain: STAT if CP now or developed within 24 hours  1. Are you having CP right now? Chest tightness  2. Are you experiencing any other symptoms (ex. SOB, nausea, vomiting, sweating)? No  3. How long have you been experiencing CP? Since 03/04/17  4. Is your CP continuous or coming and going? Coming and going  5. Have you taken Nitroglycerin? no ?

## 2017-03-29 NOTE — Telephone Encounter (Signed)
Spoke with pt and informed her of recommendations per Dr. Tamala Julian.  Pt verbalized understanding and was in agreement with this plan.

## 2017-03-29 NOTE — Telephone Encounter (Signed)
This does not sound cardiac.  Review of records does not demonstrate any real evidence of underlying cardiac disease.  More than likely this is related to her esophagus.  Follow-up with primary care.

## 2017-04-20 ENCOUNTER — Ambulatory Visit: Payer: PPO | Admitting: Interventional Cardiology

## 2017-04-20 ENCOUNTER — Encounter: Payer: Self-pay | Admitting: Interventional Cardiology

## 2017-04-20 VITALS — BP 126/82 | HR 77 | Ht 65.0 in | Wt 184.8 lb

## 2017-04-20 DIAGNOSIS — I6381 Other cerebral infarction due to occlusion or stenosis of small artery: Secondary | ICD-10-CM

## 2017-04-20 DIAGNOSIS — I63212 Cerebral infarction due to unspecified occlusion or stenosis of left vertebral arteries: Secondary | ICD-10-CM | POA: Diagnosis not present

## 2017-04-20 DIAGNOSIS — I6322 Cerebral infarction due to unspecified occlusion or stenosis of basilar arteries: Secondary | ICD-10-CM | POA: Diagnosis not present

## 2017-04-20 DIAGNOSIS — K219 Gastro-esophageal reflux disease without esophagitis: Secondary | ICD-10-CM | POA: Diagnosis not present

## 2017-04-20 DIAGNOSIS — I10 Essential (primary) hypertension: Secondary | ICD-10-CM

## 2017-04-20 NOTE — Progress Notes (Signed)
Cardiology Office Note    Date:  04/20/2017   ID:  Stephanie, Bell [birth year], MRN [id number]  PCP:  Stephanie Pink, MD  Cardiologist: Stephanie Grooms, MD   Chief Complaint  Patient presents with  . Cerebrovascular Accident  . Hyperlipidemia    History of Present Illness:  Stephanie Bell is a 77 y.o. female for concern that she has a vascular problem.  Had a left thalamic lacunar stroke in 2017.  Hyperlipidemia is noted with most recent LDL 127.  The patient is self-referred and has concerns about her previous diagnoses.  The patient is a spouse of Stephanie Bell a patient I have seen for years.  She accompanied him on the last visit and asked if she could make an appointment to discuss her heart and vascular issues.  After some discussion permission was granted.  She was asked to bring all records that document her heart history.  She is seen at [facility] clinic.  She is here today for that appointment.  She has no specific cardiac complaints.  She seems to have disbelief that she had a stroke.  She does not want to use medications to control risk factors.  No prior documentation of heart disease has been identified.  Past Medical History:  Diagnosis Date  . ADD (attention deficit disorder)   . Esophageal stricture 11/04/2014  . GERD (gastroesophageal reflux disease)   . H/O hiatal hernia   . Hx: UTI (urinary tract infection)   . Interstitial cystitis   . Kidney stone   . Paraesophageal hernia   . Pneumonia 05-09-11   2011 last time  . Stroke ([place])   . Weight loss   . Wound disruption, post-op, skin   . 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 BACK SURGERY  05-09-11   x 2 lumbar  . CHOLECYSTECTOMY  05-09-11   laparoscopic  . COLONOSCOPY  2002, 2008    rectal hyperplastic polyp 2002, diverticulosis 2008  . ESOPHAGOGASTRODUODENOSCOPY    . HERNIA REPAIR  05/16/2011   hiatal hernia  . HIATAL HERNIA REPAIR   02/09/2012   Procedure: LAPAROSCOPIC REPAIR OF HIATAL HERNIA;  Surgeon: Stephanie Earls, MD;  Location: [facility];  Service: General;  Laterality: N/A;  . INSERTION OF MESH  02/09/2012   Procedure: INSERTION OF MESH;  Surgeon: Stephanie Earls, MD;  Location: [facility];  Service: General;;  . IRRIGATION AND DEBRIDEMENT SEBACEOUS CYST  05/16/2011   Procedure: IRRIGATION AND DEBRIDEMENT SEBACEOUS CYST;  Surgeon: Stephanie Earls, MD;  Location: [facility];  Service: General;  Laterality: N/A;  middle of chest on sternum  . KIDNEY STONE SURGERY  05-09-11   past hx. and cystoscopy for bladder issues  . LAPAROSCOPIC NISSEN FUNDOPLICATION  3/55/9741   Procedure: LAPAROSCOPIC NISSEN FUNDOPLICATION;  Surgeon: Stephanie Earls, MD;  Location: [facility];  Service: General;  Laterality: N/A;  will also remove sebaceous cyst of chest wall  . LAPAROSCOPIC NISSEN FUNDOPLICATION  63/84/5364   Procedure: LAPAROSCOPIC NISSEN FUNDOPLICATION;  Surgeon: Stephanie Earls, MD;  Location: [facility];  Service: General;  Laterality: N/A;  Re-do Laparoscopic Nissen   . SEPTOPLASTY    . TONSILLECTOMY  05-09-11   Tonsillectomy  . UMBILICAL HERNIA REPAIR  05/16/2011   Procedure: HERNIA REPAIR UMBILICAL ADULT;  Surgeon: Stephanie Earls, MD;  Location: [facility];  Service: General;  Laterality: N/A;  with mesh   . VAGINAL HYSTERECTOMY  Current Medications: Outpatient Medications Prior to Visit  Medication Sig Dispense Refill  . aspirin 81 MG chewable tablet Chew 81 mg by mouth daily.    . Cholecalciferol (VITAMIN D3) 5000 UNITS CAPS Take 5,000 Units by mouth daily.    . Cranberry-Vitamin C 84-20 MG CAPS Take 2 capsules by mouth daily.    Marland Kitchen ibuprofen (ADVIL,MOTRIN) 200 MG tablet Take 200 mg by mouth every 6 (six) hours as needed for headache or mild pain.     Marland Kitchen losartan (COZAAR) 25 MG tablet Take 25 mg by mouth daily.    . Multiple Vitamins-Minerals (MULTIVITAMIN WOMEN 50+) TABS Take 1 tablet by mouth daily.    . ondansetron  (ZOFRAN-ODT) 4 MG disintegrating tablet Take 1 tablet (4 mg total) by mouth every 8 (eight) hours as needed for nausea or vomiting. 30 tablet 11  . vitamin B-12 (CYANOCOBALAMIN) 1000 MCG tablet Take 1,000 mcg by mouth daily.    Marland Kitchen losartan (COZAAR) 50 MG tablet Take 1 tablet (50 mg total) by mouth daily. (Patient not taking: Reported on 04/20/2017) 30 tablet 0   No facility-administered medications prior to visit.      Allergies:   Benzocaine; Epinephrine; and Sulfa antibiotics   Social History   Socioeconomic History  . Marital status: Married    Spouse name: Stephanie Bell  . Number of children: 3  . Years of education: College  . Highest education level: None  Social Needs  . Financial resource strain: None  . Food insecurity - worry: None  . Food insecurity - inability: None  . Transportation needs - medical: None  . Transportation needs - non-medical: None  Occupational History  . None  Tobacco Use  . Smoking status: Never Smoker  . Smokeless tobacco: Never Used  Substance and Sexual Activity  . Alcohol use: Yes    Alcohol/week: 0.6 oz    Types: 1 Glasses of wine per week    Comment: Daily 4oz  . Drug use: No  . Sexual activity: Yes  Other Topics Concern  . None  Social History Narrative   Lives with husband.   Caffeine use: none     Family History:  The patient's family history includes Alcohol abuse in her father; Alzheimer's disease in her mother; Ulcers in her father and paternal grandmother.   ROS:   Please see the history of present illness.    Concerned about tremor in her left arm.  She sees a neurologist in [place].  Otherwise has no complaints.  She denies having a history of elevated blood pressure/hypertension does not want therapy for elevated lipids stating, "my cholesterol is okay". All other systems reviewed and are negative.   PHYSICAL EXAM:   VS:  BP 126/82   Pulse 77   Ht 5\' 5"  (1.651 m)   Wt 184 lb 12.8 oz (83.8 kg)   BMI 30.75 kg/m    GEN:  Well nourished, well developed, in no acute distress  HEENT: normal  Neck: no JVD, carotid bruits, or masses Cardiac: RRR; no murmurs, rubs, or gallops,no edema  Respiratory:  clear to auscultation bilaterally, normal work of breathing GI: soft, nontender, nondistended, + BS MS: no deformity or atrophy  Skin: warm and dry, no rash Neuro:  Alert and Oriented x 3, Strength and sensation are intact Psych: euthymic mood, full affect  Wt Readings from Last 3 Encounters:  04/20/17 184 lb 12.8 oz (83.8 kg)  03/04/17 184 lb 1.4 oz (83.5 kg)  12/25/16 186 lb (84.4 kg)  Studies/Labs Reviewed:   EKG:  EKG performed on March 04, 2017, demonstrates sinus rhythm, left anterior hemiblock, poor R wave progression likely representing pseudoinfarction pattern from hemiblock.  No acute ST-T change.  No changes noted compared to February 2017.  Recent Labs: 12/25/2016: TSH 0.578 03/04/2017: B Natriuretic Peptide 43.1; Hemoglobin 14.5; Platelets 166 03/05/2017: ALT 14; BUN 13; Creatinine, Ser 0.77; Potassium 3.6; Sodium 138   Lipid Panel    Component Value Date/Time   CHOL 199 03/05/2017 0452   TRIG 117 03/05/2017 0452   HDL 49 03/05/2017 0452   CHOLHDL 4.1 03/05/2017 0452   VLDL 23 03/05/2017 0452   LDLCALC 127 (H) 03/05/2017 0452    Additional studies/ records that were reviewed today include:  2D Doppler echocardiogram 03/05/2017:  Study Conclusions   - Left ventricle: The cavity size was normal. There was moderate   focal basal hypertrophy of the septum. Systolic function was   normal. The estimated ejection fraction was in the range of 55%   to 60%. Wall motion was normal; there were no regional wall   motion abnormalities. Doppler parameters are consistent with   abnormal left ventricular relaxation (grade 1 diastolic   dysfunction).   Impressions:   - Compared to the prior study, there has been no significant   interval change.     ASSESSMENT:    1. Essential  hypertension   2. 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 Gastroesophageal reflux disease without esophagitis      PLAN:  In order of problems listed above:  1. The blood pressure is adequately controlled.  No adjustments or recommendations made. 2. She is status post lacunar infarct in the left thalamic region.  We discussed risk factor modification including LDL less than 70, adequate blood pressure control, glycemic control, and aerobic exercise.  The only issue not currently being actively managed is her lipid level.  LDL cholesterol 127 in December 2018.  Needs to be on statin therapy. 3. Not addressed  There is no current symptomatic cardiovascular problem.  Vascular disease having had a lacunar left brain infarct in 2017.  She feels this may have been caused by epinephrine that she got during dental treatment 6 months prior to the event.  We discussed and dispel this notion.  We also discussed occasion and the need for statin therapy.  She did not agreed to institute therapy.  In absence of any active/ongoing cardiac problems no specific other recommendations were made.  No cardiac follow-up is needed at this time.    Medication Adjustments/Labs and Tests Ordered: Current medicines are reviewed at length with the patient today.  Concerns regarding medicines are outlined above.  Medication changes, Labs and Tests ordered today are listed in the Patient Instructions below. Patient Instructions  Medication Instructions:  Your physician recommends that you continue on your current medications as directed. Please refer to the Current Medication list given to you today.  Labwork: None  Testing/Procedures: None  Follow-Up: Your physician recommends that you schedule a follow-up appointment as needed with Dr. Tamala Julian.    Any Other Special Instructions Will Be Listed Below (If Applicable).  Your LDL or bad cholesterol needs to be  70 or lower.  Have your Primary Care monitor this and treat properly.   If you need a refill on your cardiac medications before your next appointment, please call your pharmacy.      Signed, Stephanie Grooms, MD  04/20/2017 5:18 PM  [facility] [place], [place], [place]  [address] Phone: [phone]; Fax: [phone]

## 2017-04-20 NOTE — Patient Instructions (Addendum)
Medication Instructions:  Your physician recommends that you continue on your current medications as directed. Please refer to the Current Medication list given to you today.  Labwork: None  Testing/Procedures: None  Follow-Up: Your physician recommends that you schedule a follow-up appointment as needed with Dr. Tamala Julian.    Any Other Special Instructions Will Be Listed Below (If Applicable).  Your LDL or bad cholesterol needs to be 70 or lower.  Have your Primary Care monitor this and treat properly.   If you need a refill on your cardiac medications before your next appointment, please call your pharmacy.

## 2017-04-27 ENCOUNTER — Ambulatory Visit: Payer: PPO | Admitting: Neurology

## 2017-04-30 ENCOUNTER — Ambulatory Visit: Payer: PPO | Admitting: Neurology

## 2017-04-30 ENCOUNTER — Encounter: Payer: Self-pay | Admitting: Neurology

## 2017-04-30 VITALS — BP 105/73 | HR 76 | Ht 65.0 in | Wt 183.0 lb

## 2017-04-30 DIAGNOSIS — G252 Other specified forms of tremor: Secondary | ICD-10-CM

## 2017-04-30 DIAGNOSIS — R251 Tremor, unspecified: Secondary | ICD-10-CM

## 2017-04-30 DIAGNOSIS — R259 Unspecified abnormal involuntary movements: Secondary | ICD-10-CM

## 2017-04-30 DIAGNOSIS — I6381 Other cerebral infarction due to occlusion or stenosis of small artery: Secondary | ICD-10-CM | POA: Diagnosis not present

## 2017-04-30 MED ORDER — ASPIRIN EC 325 MG PO TBEC: 325.0000 mg | DELAYED_RELEASE_TABLET | Freq: Every day | ORAL | 0 refills | Status: DC

## 2017-04-30 NOTE — Progress Notes (Signed)
[facility]    Provider:  Dr Jaynee Eagles Referring Provider: [place] Pink, MD Primary Care Physician:  [place] Pink, MD  CC:  Thalamic stroke, tremor  Interval history 04/30/2017: Patient is here for follow-up of tremor.  Her last appointment we discussed that she had some mild parkinsonian symptoms.  Discussed the differential which includes Parkinson's disease.  The options were to start medication, follow clinically, or perform a DaTscan.  At that time she wanted to perform the DaTscan but she changed her mind.  Today we discussed parkinsonism in all the different reasons.  She continues to have tremors, resting and action.  Discussed Parkinson's disease, also essential tremor.  Discussed medication management and Parkinson's disease including dopamine, dopamine agonists and others.  At this time she decided that she like to follow clinically which I agree with.  We will see her back in a year.  Otherwise she is doing excellent, she is playing tennis.   Interval history 12/25/2016; She has noticed tremor in her left hand noticed it first in may when driving and more recently when holding something heavy, she notices it at rest as well. She has some stress in her life. Happens every day, No caffeine. She is on aspirin. No tremor in the family. No FHx of Parkinson's. No slowing, no shuffling, no falls so far.   HPI:  Stephanie Bell is a lovely 77 y.o. female here as a referral from Dr. Kary Kos for thalamic stroke. She has a past medical history of hypertension, hyperlipidemia, no diabetes. She presented to the hospital on February 24 after waking up with right foot and leg numbness. She also reported weakness. No other neurologic deficits. No aphasia, no dysphagia, no dysarthria, no other weakness or numbness, no altered mentation. Her husband gave her to aspirin may went to the emergency room. She had an MRI of the brain which was positive for stroke.  Sheis feeling much better.  Hisband here and provides information as well. No more numbness and no more weakness. She feels scared that she had a stroke, doesn't understand why or where in the brain she had it. Never had a stroke before. There were no inciting events, she just woke up. No FHx of stroke.  Reviewed notes, labs and imaging from outside physicians, which showed:  ldl 138. hgba1c wnl  Personally reviewed images with patient and her husband and agree with the following:  MRI HEAD FINDINGS  Calvarium and upper cervical spine: No focal marrow signal abnormality.  Orbits: Negative.  Sinuses and Mastoids: Mucosal thickening on the left, overall mild. No effusion.  Brain: Ovoid 1 cm area of restricted diffusion in the lateral left thalamus, extending into the posterior limb internal capsule. No hemorrhagic conversion. No other territory of acute infarct. No evidence of major vessel occlusion.  Elsewhere, microvascular ischemic change is mild/expected for age. No previous infarct. No chronic hemorrhagic foci.  MRA HEAD FINDINGS  Symmetric carotid and vertebral arteries. Intact circle Willis with small anterior communicating artery.  Symmetric vertebrobasilar branching. Symmetric poor signal in distal vertebral and proximal basilar arteries is considered artifactual. These vessels have normal signal on T2 weighted conventional imaging.  Atherosclerotic type narrowings:  Distal left M1 segment high-grade tandem stenoses.  Moderate right proximal M2 segment stenosis.  Bilateral M3 narrowing, potentially overestimated due to artifact (poor signal in the bilateral A2 segments appears artifactual).  Bilateral atherosclerotic irregularity of the proximal PCA.   IMPRESSION: 1. 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 2. No  acute arterial finding. 3. Intracranial atherosclerosis as described. No asymmetric disease in the proximal left PCA to correlate with  #1.  Ultrasound carotids with no hemodynamically significant stenosis.  Review of Systems: Patient complains of symptoms per HPI as well as the following symptoms: tremor. Pertinent negatives and positives per HPI. All others negative.   Social History   Socioeconomic History  . Marital status: Married    Spouse name: [name]  . Number of children: 3  . Years of education: College  . Highest education level: Not on file  Social Needs  . Financial resource strain: Not on file  . Food insecurity - worry: Not on file  . Food insecurity - inability: Not on file  . Transportation needs - medical: Not on file  . Transportation needs - non-medical: Not on file  Occupational History  . Not on file  Tobacco Use  . Smoking status: Never Smoker  . Smokeless tobacco: Never Used  Substance and Sexual Activity  . Alcohol use: Yes    Alcohol/week: 0.6 oz    Types: 1 Glasses of wine per week    Comment: Daily 4oz  . Drug use: No  . Sexual activity: Yes  Other Topics Concern  . Not on file  Social History Narrative   Lives with husband.   Caffeine use: occasional coke   Right handed    Family History  Problem Relation Age of Onset  . Ulcers Father   . Alcohol abuse Father   . Alzheimer's disease Mother   . Ulcers Paternal Grandmother   . Colon cancer Neg Hx   . Stroke Neg Hx     Past Medical History:  Diagnosis Date  . ADD (attention deficit disorder)   . Esophageal stricture 11/04/2014  . GERD (gastroesophageal reflux disease)   . H/O hiatal hernia   . Hx: UTI (urinary tract infection)   . Interstitial cystitis   . Kidney stone   . Palpitations 02/2017  . Paraesophageal hernia   . Pneumonia 05-09-11   2011 last time  . Stroke ([place])   . Weight loss   . Wound disruption, post-op, skin   . 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 BACK SURGERY  05-09-11   x 2 lumbar  . CHOLECYSTECTOMY  05-09-11    laparoscopic  . COLONOSCOPY  2002, 2008    rectal hyperplastic polyp 2002, diverticulosis 2008  . ESOPHAGOGASTRODUODENOSCOPY    . HERNIA REPAIR  05/16/2011   hiatal hernia  . HIATAL HERNIA REPAIR  02/09/2012   Procedure: LAPAROSCOPIC REPAIR OF HIATAL HERNIA;  Surgeon: Pedro Earls, MD;  Location: [facility];  Service: General;  Laterality: N/A;  . INSERTION OF MESH  02/09/2012   Procedure: INSERTION OF MESH;  Surgeon: Pedro Earls, MD;  Location: [facility];  Service: General;;  . IRRIGATION AND DEBRIDEMENT SEBACEOUS CYST  05/16/2011   Procedure: IRRIGATION AND DEBRIDEMENT SEBACEOUS CYST;  Surgeon: Pedro Earls, MD;  Location: [facility];  Service: General;  Laterality: N/A;  middle of chest on sternum  . KIDNEY STONE SURGERY  05-09-11   past hx. and cystoscopy for bladder issues  . LAPAROSCOPIC NISSEN FUNDOPLICATION  2/50/5397   Procedure: LAPAROSCOPIC NISSEN FUNDOPLICATION;  Surgeon: Pedro Earls, MD;  Location: [facility];  Service: General;  Laterality: N/A;  will also remove sebaceous cyst of chest wall  . LAPAROSCOPIC NISSEN FUNDOPLICATION  67/34/1937   Procedure: LAPAROSCOPIC NISSEN FUNDOPLICATION;  Surgeon: Rodman Key  Verdie Drown, MD;  Location: [facility];  Service: General;  Laterality: N/A;  Re-do Laparoscopic Nissen   . SEPTOPLASTY    . TONSILLECTOMY  05-09-11   Tonsillectomy  . UMBILICAL HERNIA REPAIR  05/16/2011   Procedure: HERNIA REPAIR UMBILICAL ADULT;  Surgeon: Pedro Earls, MD;  Location: [facility];  Service: General;  Laterality: N/A;  with mesh   . VAGINAL HYSTERECTOMY      Current Outpatient Medications  Medication Sig Dispense Refill  . Cholecalciferol (VITAMIN D3) 5000 UNITS CAPS Take 5,000 Units by mouth daily.    . Cranberry-Vitamin C 84-20 MG CAPS Take 2 capsules by mouth daily.    Marland Kitchen ibuprofen (ADVIL,MOTRIN) 200 MG tablet Take 200 mg by mouth every 6 (six) hours as needed for headache or mild pain.     Marland Kitchen losartan (COZAAR) 25 MG tablet Take 25 mg by mouth daily.    .  Multiple Vitamins-Minerals (MULTIVITAMIN WOMEN 50+) TABS Take 1 tablet by mouth daily.    . ondansetron (ZOFRAN-ODT) 4 MG disintegrating tablet Take 1 tablet (4 mg total) by mouth every 8 (eight) hours as needed for nausea or vomiting. 30 tablet 11  . vitamin B-12 (CYANOCOBALAMIN) 1000 MCG tablet Take 1,000 mcg by mouth daily.    Marland Kitchen aspirin EC 325 MG tablet Take 1 tablet (325 mg total) by mouth daily. 30 tablet 0   No current facility-administered medications for this visit.     Allergies as of 04/30/2017 - Review Complete 04/30/2017  Allergen Reaction Noted  . Benzocaine Palpitations 05/24/2015  . Epinephrine Palpitations and Other (See Comments) 02/21/2015  . Sulfa antibiotics Swelling and Rash 02/22/2011    Vitals: BP 105/73 (BP Location: Left Arm, Patient Position: Sitting)   Pulse 76   Ht 5\' 5"  (1.651 m)   Wt 183 lb (83 kg)   BMI 30.45 kg/m  Last Weight:  Wt Readings from Last 1 Encounters:  04/30/17 183 lb (83 kg)   Last Height:   Ht Readings from Last 1 Encounters:  04/30/17 5\' 5"  (1.651 m)   Physical exam: Exam: Gen: NAD, conversant, well nourised, obese, well groomed                     Eyes: Conjunctivae clear without exudates or hemorrhage  Neuro: Detailed Neurologic Exam  Speech:    Speech is normal; fluent and spontaneous with normal comprehension.  Cognition:    The patient is oriented to person, place, and time;     Cranial Nerves:    The pupils are equal, round, and reactive to light.  Visual fields are full to finger confrontation. Extraocular movements are intact. Trigeminal sensation is intact and the muscles of mastication are normal. The face is symmetric. The palate elevates in the midline. Hearing intact. Voice is normal. Shoulder shrug is normal. The tongue has normal motion without fasciculations.   Motor Observation: resting tremor, mild and intermittent but present and course  Gait: Low clearance, narrow gait, small strides but not classically  shuffling, stable     Tone:    Normal muscle tone.    Posture:    Minimally stooped    Strength:    Strength is V/V in the upper and lower limbs.      Sensation: intact to LT    Assessment/Plan:  This is a lovely 77 year old female with hypertension and hyperlipidemia who presented to [facility]2015 with a nonhemorrhagic lacunar infarct in the left thalamus due to small vessel disease.  She was on aspirin at home. ldl 138. hgba1c wnl. MRA did show significant atherosclerotic disease. Discussed with patient and husband and reviewed images.  Lacunar infarct: I had a long d/w patient about her stroke, risk for recurrent stroke/TIAs, personally independently reviewed imaging studies and stroke evaluation results and answered questions.Continue [id number] for secondary stroke prevention and maintain strict control of hypertension with blood pressure goal below 130/90, diabetes with hemoglobin A1c goal below 6.5% and lipids with LDL cholesterol goal below 70 mg/dL. I also advised the patient to eat a healthy diet with plenty of whole grains, cereals, fruits and vegetables, exercise regularly and maintain ideal body weight .  Patient's LDL is elevated, she declines cholesterol medication, encouraged getting LDL less than 70.  Tremor: She is here for follow-up of resting and action tremor. Exam shows resting course tremor, no action tremor visible or postural tremor. Exam does show some mild stooped posture, and gait with low clearance, narrow gait, small strides but not classically shuffling. May be the onset of parkinson'd disease. Gave patient the option to just observe for now, also DAT scan or medication.  We will continue to follow clinically.  Follow-up one year.   Sarina Ill, MD  [facility] [address] [place], [place] [address]  Phone [phone] Fax [phone]  A total of 30 minutes was spent in with this patient. Over half this  time was spent on counseling patient on the tremor, lacunar stroke diagnosis and different therapeutic options available.

## 2017-04-30 NOTE — Patient Instructions (Addendum)
Continue current medications Continue to be as wonderful and hilarious as you are, you made my day.

## 2017-05-29 DIAGNOSIS — J0100 Acute maxillary sinusitis, unspecified: Secondary | ICD-10-CM | POA: Diagnosis not present

## 2017-07-22 ENCOUNTER — Other Ambulatory Visit: Payer: Self-pay

## 2017-07-22 ENCOUNTER — Emergency Department
Admission: EM | Admit: 2017-07-22 | Discharge: 2017-07-22 | Disposition: A | Discharge status: Home / Self Care | Payer: PPO | Attending: Emergency Medicine | Admitting: Emergency Medicine

## 2017-07-22 ENCOUNTER — Emergency Department: Payer: PPO

## 2017-07-22 DIAGNOSIS — N23 Unspecified renal colic: Secondary | ICD-10-CM | POA: Insufficient documentation

## 2017-07-22 DIAGNOSIS — R1031 Right lower quadrant pain: Secondary | ICD-10-CM | POA: Diagnosis not present

## 2017-07-22 DIAGNOSIS — Z8673 Personal history of transient ischemic attack (TIA), and cerebral infarction without residual deficits: Secondary | ICD-10-CM | POA: Insufficient documentation

## 2017-07-22 DIAGNOSIS — R110 Nausea: Secondary | ICD-10-CM | POA: Insufficient documentation

## 2017-07-22 DIAGNOSIS — N132 Hydronephrosis with renal and ureteral calculous obstruction: Secondary | ICD-10-CM | POA: Diagnosis not present

## 2017-07-22 DIAGNOSIS — I10 Essential (primary) hypertension: Secondary | ICD-10-CM | POA: Diagnosis not present

## 2017-07-22 DIAGNOSIS — R109 Unspecified abdominal pain: Secondary | ICD-10-CM | POA: Diagnosis present

## 2017-07-22 LAB — CBC
HCT: 39.7 % (ref 35.0–47.0)
HEMOGLOBIN: 13.8 g/dL (ref 12.0–16.0)
MCH: 30.1 pg (ref 26.0–34.0)
MCHC: 34.9 g/dL (ref 32.0–36.0)
MCV: 86.2 fL (ref 80.0–100.0)
PLATELETS: 123 10*3/uL — AB (ref 150–440)
RBC: 4.6 MIL/uL (ref 3.80–5.20)
RDW: 13.5 % (ref 11.5–14.5)
WBC: 5.7 10*3/uL (ref 3.6–11.0)

## 2017-07-22 LAB — URINALYSIS, COMPLETE (UACMP) WITH MICROSCOPIC
Bilirubin Urine: NEGATIVE
GLUCOSE, UA: NEGATIVE mg/dL
KETONES UR: NEGATIVE mg/dL
NITRITE: NEGATIVE
PH: 7 (ref 5.0–8.0)
Protein, ur: NEGATIVE mg/dL
SPECIFIC GRAVITY, URINE: 1.01 (ref 1.005–1.030)

## 2017-07-22 LAB — COMPREHENSIVE METABOLIC PANEL
ALBUMIN: 3.9 g/dL (ref 3.5–5.0)
ALT: 15 U/L (ref 14–54)
ANION GAP: 9 (ref 5–15)
AST: 29 U/L (ref 15–41)
Alkaline Phosphatase: 61 U/L (ref 38–126)
BILIRUBIN TOTAL: 1.2 mg/dL (ref 0.3–1.2)
BUN: 11 mg/dL (ref 6–20)
CALCIUM: 9 mg/dL (ref 8.9–10.3)
CO2: 26 mmol/L (ref 22–32)
CREATININE: 0.94 mg/dL (ref 0.44–1.00)
Chloride: 103 mmol/L (ref 101–111)
GFR calc Af Amer: >60 mL/min (ref >60)
GFR calc non Af Amer: 57 mL/min — ABNORMAL LOW (ref >60)
GLUCOSE: 129 mg/dL — AB (ref 65–99)
Potassium: 3.5 mmol/L (ref 3.5–5.1)
Sodium: 138 mmol/L (ref 135–145)
TOTAL PROTEIN: 6.5 g/dL (ref 6.5–8.1)

## 2017-07-22 LAB — LIPASE, BLOOD: LIPASE: 24 U/L (ref 11–51)

## 2017-07-22 MED ORDER — CEPHALEXIN 250 MG PO CAPS: 250.0000 mg | ORAL_CAPSULE | Freq: Four times a day (QID) | ORAL | 0 refills | Status: AC

## 2017-07-22 MED ORDER — MORPHINE SULFATE (PF) 4 MG/ML IV SOLN
4.0000 mg | Freq: Once | INTRAVENOUS | Status: AC
  Administered 2017-07-22: 4 mg via INTRAVENOUS
  Filled 2017-07-22: qty 1

## 2017-07-22 MED ORDER — OXYCODONE-ACETAMINOPHEN 5-325 MG PO TABS: 1.0000 | ORAL_TABLET | Freq: Three times a day (TID) | ORAL | 0 refills | Status: DC | PRN

## 2017-07-22 MED ORDER — ONDANSETRON HCL 4 MG/2ML IJ SOLN
4.0000 mg | Freq: Once | INTRAMUSCULAR | Status: AC
  Administered 2017-07-22: 4 mg via INTRAVENOUS
  Filled 2017-07-22: qty 2

## 2017-07-22 MED ORDER — ONDANSETRON 4 MG PO TBDP: 4.0000 mg | ORAL_TABLET | Freq: Three times a day (TID) | ORAL | 0 refills | Status: DC | PRN

## 2017-07-22 MED ORDER — KETOROLAC TROMETHAMINE 30 MG/ML IJ SOLN
15.0000 mg | Freq: Once | INTRAMUSCULAR | Status: AC
  Administered 2017-07-22: 15 mg via INTRAVENOUS
  Filled 2017-07-22: qty 1

## 2017-07-22 NOTE — ED Triage Notes (Signed)
Pt arrives via ems from home, pt was given 4mg  of zofran via ems, pt reports sudden onset of rlq pain, with tenderness of ruq with palpation. Pt is nauseated and moaning in pain, pt was given an enema by her spouse prior to arrival with a small amount of stool relief. Pt reports that she has 1 BM a week per her norm.

## 2017-07-22 NOTE — ED Provider Notes (Signed)
[facility] Emergency Department Provider Note       Time seen: ----------------------------------------- 3:46 PM on 07/22/2017 -----------------------------------------   I have reviewed the triage vital signs and the nursing notes.  HISTORY   Chief Complaint No chief complaint on file.    HPI Stephanie Bell is a 77 y.o. female with a history of ADD, GERD, kidney stones, pneumonia who presents to the ED for abdominal pain that began today.  Patient states she had one kidney stone before when she was 8, it feels similarly.  She has a sharp right flank pain with nausea.  She denies any urinary symptoms.  Pain was very sudden onset, nothing makes it better.  She denies any nausea or vomiting currently.  Past Medical History:  Diagnosis Date  . ADD (attention deficit disorder)   . Esophageal stricture 11/04/2014  . GERD (gastroesophageal reflux disease)   . H/O hiatal hernia   . Hx: UTI (urinary tract infection)   . Interstitial cystitis   . Kidney stone   . Palpitations 02/2017  . Paraesophageal hernia   . Pneumonia 05-09-11   2011 last time  . Stroke ([place])   . Weight loss   . Wound disruption, post-op, skin   . 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 Essential hypertension   . Atypical chest pain   . Chest pain 03/04/2017  . 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[place]) 06/01/2015  . Lacunar stroke 05/14/2015  . Elevated troponin 01/22/2015  . Esophageal stricture 11/04/2014  . GERD (gastroesophageal reflux disease) 11/04/2014  . Nausea without vomiting 11/04/2014  . 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 Sebaceous cyst of breast 12/20/2011  . S/P Nissen fundoplication (without gastrostomy tube) procedure 06/08/2011    Past Surgical History:  Procedure Laterality Date  . BACK SURGERY  05-09-11   x 2 lumbar  . CHOLECYSTECTOMY  05-09-11    laparoscopic  . COLONOSCOPY  2002, 2008    rectal hyperplastic polyp 2002, diverticulosis 2008  . ESOPHAGOGASTRODUODENOSCOPY    . HERNIA REPAIR  05/16/2011   hiatal hernia  . HIATAL HERNIA REPAIR  02/09/2012   Procedure: LAPAROSCOPIC REPAIR OF HIATAL HERNIA;  Surgeon: Pedro Earls, MD;  [facility]: [facility];  Service: General;  Laterality: N/A;  . INSERTION OF MESH  02/09/2012   Procedure: INSERTION OF MESH;  Surgeon: Pedro Earls, MD;  [facility]: [facility];  Service: General;;  . IRRIGATION AND DEBRIDEMENT SEBACEOUS CYST  05/16/2011   Procedure: IRRIGATION AND DEBRIDEMENT SEBACEOUS CYST;  Surgeon: Pedro Earls, MD;  [facility]: [facility];  Service: General;  Laterality: N/A;  middle of chest on sternum  . KIDNEY STONE SURGERY  05-09-11   past hx. and cystoscopy for bladder issues  . LAPAROSCOPIC NISSEN FUNDOPLICATION  6/31/4970   Procedure: LAPAROSCOPIC NISSEN FUNDOPLICATION;  Surgeon: Pedro Earls, MD;  [facility]: [facility];  Service: General;  Laterality: N/A;  will also remove sebaceous cyst of chest wall  . LAPAROSCOPIC NISSEN FUNDOPLICATION  26/37/8588   Procedure: LAPAROSCOPIC NISSEN FUNDOPLICATION;  Surgeon: Pedro Earls, MD;  [facility];  Service: General;  Laterality: N/A;  Re-do Laparoscopic Nissen   . SEPTOPLASTY    . TONSILLECTOMY  05-09-11   Tonsillectomy  . UMBILICAL HERNIA REPAIR  05/16/2011   Procedure: HERNIA REPAIR UMBILICAL ADULT;  Surgeon: Pedro Earls, MD;  [facility]: [facility];  Service: General;  Laterality: N/A;  with mesh   . VAGINAL HYSTERECTOMY      Allergies Benzocaine; Epinephrine; and Sulfa antibiotics  Social History Social History   Tobacco Use  . Smoking status: Never Smoker  . Smokeless tobacco: Never Used  Substance Use Topics  . Alcohol use: Yes    Alcohol/week: 0.6 oz    Types: 1 Glasses of wine per week    Comment: Daily 4oz  . Drug use: No   Review of Systems Constitutional: Negative for fever. Cardiovascular: Negative for  chest pain. Respiratory: Negative for shortness of breath. Gastrointestinal: Positive for abdominal pain, nausea Musculoskeletal: Negative for back pain. Skin: Negative for rash. Neurological: Negative for headaches, focal weakness or numbness.  All systems negative/normal/unremarkable except as stated in the HPI  ____________________________________________   PHYSICAL EXAM:  VITAL SIGNS: ED Triage Vitals  Enc Vitals Group     BP      Pulse      Resp      Temp      Temp src      SpO2      Weight      Height      Head Circumference      Peak Flow      Pain Score      Pain Loc      Pain Edu?      Excl. in [place]?    Constitutional: Alert and oriented.  Mild to moderate distress Eyes: Conjunctivae are normal. Normal extraocular movements. ENT   Head: Normocephalic and atraumatic.   Nose: No congestion/rhinnorhea.   Mouth/Throat: Mucous membranes are moist.   Neck: No stridor. Cardiovascular: Normal rate, regular rhythm. No murmurs, rubs, or gallops. Respiratory: Normal respiratory effort without tachypnea nor retractions. Breath sounds are clear and equal bilaterally. No wheezes/rales/rhonchi. Gastrointestinal: Right flank tenderness, no rebound or guarding.  Normal bowel sounds. Musculoskeletal: Nontender with normal range of motion in extremities. No lower extremity tenderness nor edema. Neurologic:  Normal speech and language. No gross focal neurologic deficits are appreciated.  Skin:  Skin is warm, dry and intact. No rash noted. Psychiatric: Mood and affect are normal. Speech and behavior are normal.  ____________________________________________  ED COURSE:  As part of my medical decision making, I reviewed the following data within the [place] History obtained from family if available, nursing notes, old chart and ekg, as well as notes from prior ED visits. Patient presented for flank pain, we will assess with labs and imaging as  indicated at this time.   Procedures ____________________________________________   LABS (pertinent positives/negatives)  Labs Reviewed  COMPREHENSIVE METABOLIC PANEL - Abnormal; Notable for the following components:      Result Value   Glucose, Bld 129 (*)    GFR calc non Af Amer 57 (*)    All other components within normal limits  CBC - Abnormal; Notable for the following components:   Platelets 123 (*)    All other components within normal limits  URINALYSIS, COMPLETE (UACMP) WITH MICROSCOPIC - Abnormal; Notable for the following components:   Color, Urine YELLOW (*)    APPearance CLEAR (*)    Hgb urine dipstick MODERATE (*)    Leukocytes, UA MODERATE (*)    Bacteria, UA FEW (*)    All other components within normal limits  LIPASE, BLOOD    RADIOLOGY Images were viewed by me  CT renal protocol IMPRESSION: 1. Right-sided hydronephrosis and hydroureter, likely due to a 1-2 mm calculus at the ureterovesical junction. This may  have already passed into the bladder. Associated perinephric and periureteral soft tissue stranding extending around the proximal duodenum. 2. No other urinary tract calculi are seen. 3. No other acute findings or significant changes. 4. Stable incidental findings including a hiatal hernia, mild biliary dilatation post cholecystectomy, lumbar spondylosis and Aortic Atherosclerosis (ICD10-I70.0). ____________________________________________  DIFFERENTIAL DIAGNOSIS   Renal colic, UTI, pyelonephritis, muscle strain, appendicitis  FINAL ASSESSMENT AND PLAN  Renal colic   Plan: The patient had presented for right flank pain. Patient's labs did reveal hematuria. Patient's imaging did reveal a distal right ureteral stone.  She will be discharged with pain medicine, antiemetics and Keflex.  She is cleared for outpatient follow-up with urology.   Laurence Aly, MD   Note: This note was generated in part or whole with voice recognition  software. Voice recognition is usually quite accurate but there are transcription errors that can and very often do occur. I apologize for any typographical errors that were not detected and corrected.     Earleen Newport, MD 07/22/17 [phone]

## 2017-07-22 NOTE — ED Triage Notes (Signed)
Pt arrives to ED via [facility] from home for abd pain that began today. States has had one kidney stone before when she was 49, feels the same. Denies urinary symptoms. Pt does not have gallbaldder but has appendix. Moaning upon assessment. C/o R side abd pain. EMS gave 4mg  zofran. Denies N&V at current. Husband states that pt has had on and off constipation x few months, had enema today.

## 2017-07-22 NOTE — ED Notes (Signed)
Attempted to sign esignature pad, pad wasn't working. Attempted to print out signature page and sign on paper, printer not working at this time. Pt verbalized understanding of DC instructions. Wheeled to car by this RN to have husband drive pt home.

## 2017-07-26 DIAGNOSIS — K5900 Constipation, unspecified: Secondary | ICD-10-CM | POA: Diagnosis not present

## 2017-08-01 DIAGNOSIS — N201 Calculus of ureter: Secondary | ICD-10-CM | POA: Diagnosis not present

## 2017-08-23 ENCOUNTER — Encounter: Payer: Self-pay | Admitting: Internal Medicine

## 2017-08-23 ENCOUNTER — Ambulatory Visit (INDEPENDENT_AMBULATORY_CARE_PROVIDER_SITE_OTHER): Payer: PPO | Admitting: Internal Medicine

## 2017-08-23 ENCOUNTER — Encounter

## 2017-08-23 VITALS — BP 122/74 | HR 80 | Ht 65.0 in | Wt 182.5 lb

## 2017-08-23 DIAGNOSIS — R110 Nausea: Secondary | ICD-10-CM

## 2017-08-23 DIAGNOSIS — K5909 Other constipation: Secondary | ICD-10-CM | POA: Diagnosis not present

## 2017-08-23 DIAGNOSIS — F40298 Other specified phobia: Secondary | ICD-10-CM

## 2017-08-23 DIAGNOSIS — R32 Unspecified urinary incontinence: Secondary | ICD-10-CM | POA: Diagnosis not present

## 2017-08-23 NOTE — Patient Instructions (Signed)
   Dr Carlean Purl recommends that you complete a bowel purge (to clean out your bowels). Please do the following: Purchase a bottle of Miralax over the counter as well as a box of 5 mg dulcolax tablets. Take 4 dulcolax tablets. Wait 1 hour. You will then drink 6-8 capfuls of Miralax mixed in an adequate amount of water/juice/gatorade (you may choose which of these liquids to drink) over the next 2-3 hours. You should expect results within 1 to 6 hours after completing the bowel purge.   Continue your power pudding.   You may use Dulcolax tablets if you go several days without a bowel movement.  You may use a enema if needed.   Please follow up with Dr Carlean Purl in August.    I appreciate the opportunity to care for you. Silvano Rusk, MD, [facility]

## 2017-08-23 NOTE — Progress Notes (Signed)
Stephanie Bell 77 y.o. 11-27-1940 [id number]  Assessment & Plan:   Encounter Diagnoses  Name Primary?  . Constipation Yes  . Nausea without vomiting   . Urinary incontinence, unspecified type   . Cancer phobia    I tried to reassure her.  We did not talk about her CT scan I looked at that after she left but that is reassuring, colonoscopy was negative in 2014, I think the ondansetron might have something to do with her constipation.  She has not given conservative therapy a good try admittedly.  Plans are as follows:  MiraLax purge (4 Dulcolax followed by 4 to 6 glasses of MiraLAX over 2 to 3 hours) Power Pudding daily Prn dulcolax Prn enema Hold pelvic PT in reserve  If she fails to improve on this she is to let me know otherwise we will plan for a follow-up in August also.  I appreciate the opportunity to care for this patient. CC: Maryland Pink, MD   Subjective:   Chief Complaint:constipation  HPI The patient comes with a several month history of constipation described as lack of urge to defecate, she will try Dulcolax intermittently she is tried a mixture of bran prunes and prune juice and applesauce without benefit but she has not used any of these on a regular consistent basis admittedly.  She will use some Dulcolax with intermittent results she has had an enema.  There is no progressive decrease in caliber of stool with just intermittent lack of defecation and she will go up to a week without bowel movement.  Colonoscopy in 2014 was negative.  She says "I just need to know if I do not have cancer" there is no specific abdominal pain described.  She does take Zofran 5 days out of 7 to help nausea to prevent vomiting and that is because if she vomits she has a risk of breakdown of her fundoplication which happened once before and she had a redo surgery.  She is still is active visit she can be though she has had a stroke and uses a cane she gets out on the tennis court  and tries to play.  She is concerned because her urine is sometimes dark and she brings a jar of urine and shows me today which looks like a normal color to me.  She has Percocet on her list but rarely uses it.  Interstitial cystitis is not a problem anymore since she takes cranberry vitamin C capsules.  She does have chronic urinary incontinence that sounds stress and nonstress.  She had 3 children.  She is not describing fecal incontinence or significant straining to stool.  Lab Results  Component Value Date   WBC 5.7 07/22/2017   HGB 13.8 07/22/2017   HCT 39.7 07/22/2017   MCV 86.2 07/22/2017   PLT 123 (L) 07/22/2017  CT renal stone study may 08/06/2017 IMPRESSION: 1. Right-sided hydronephrosis and hydroureter, likely due to a 1-2 mm calculus at the ureterovesical junction. This may have already passed into the bladder. Associated perinephric and periureteral soft tissue stranding extending around the proximal duodenum. 2. No other urinary tract calculi are seen. 3. No other acute findings or significant changes. 4. Stable incidental findings including a hiatal hernia, mild biliary dilatation post cholecystectomy, lumbar spondylosis and Aortic Atherosclerosis (ICD10-I70.0). Allergies  Allergen Reactions  . Benzocaine Palpitations    Specific agent was Scandonest 2 %  . Epinephrine Palpitations and Other (See Comments)    Reaction:  Tachycardia   .  Sulfa Antibiotics Swelling and Rash   Current Meds  Medication Sig  . aspirin EC 325 MG tablet Take 1 tablet (325 mg total) by mouth daily. (Patient taking differently: Take 81 mg by mouth daily. )  . Cholecalciferol (VITAMIN D3) 5000 UNITS CAPS Take 5,000 Units by mouth daily.  . Cranberry-Vitamin C 84-20 MG CAPS Take 2 capsules by mouth daily.  Marland Kitchen ibuprofen (ADVIL,MOTRIN) 200 MG tablet Take 200 mg by mouth every 6 (six) hours as needed for headache or mild pain.   . Multiple Vitamins-Minerals (MULTIVITAMIN WOMEN 50+) TABS Take 1  tablet by mouth daily.  . ondansetron (ZOFRAN ODT) 4 MG disintegrating tablet Take 1 tablet (4 mg total) by mouth every 8 (eight) hours as needed for nausea or vomiting.  Marland Kitchen oxyCODONE-acetaminophen (PERCOCET) 5-325 MG tablet Take 1-2 tablets by mouth every 8 (eight) hours as needed.   Past Medical History:  Diagnosis Date  . ADD (attention deficit disorder)   . Anemia   . Esophageal stricture 11/04/2014  . GERD (gastroesophageal reflux disease)   . H/O hiatal hernia   . Hx: UTI (urinary tract infection)   . Interstitial cystitis   . Kidney stone   . Lumbago   . Palpitations 02/2017  . Paraesophageal hernia   . Pneumonia 05-09-11   2011 last time  . Stroke ([place])   . Weight loss   . Wound disruption, post-op, skin   . 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 BACK SURGERY  05-09-11   x 2 lumbar  . CHOLECYSTECTOMY  05-09-11   laparoscopic  . COLONOSCOPY  2002, 2008    rectal hyperplastic polyp 2002, diverticulosis 2008  . ESOPHAGOGASTRODUODENOSCOPY    . HERNIA REPAIR  05/16/2011   hiatal hernia  . HIATAL HERNIA REPAIR  02/09/2012   Procedure: LAPAROSCOPIC REPAIR OF HIATAL HERNIA;  Surgeon: Pedro Earls, MD;  Location: [facility];  Service: General;  Laterality: N/A;  . INSERTION OF MESH  02/09/2012   Procedure: INSERTION OF MESH;  Surgeon: Pedro Earls, MD;  Location: [facility];  Service: General;;  . IRRIGATION AND DEBRIDEMENT SEBACEOUS CYST  05/16/2011   Procedure: IRRIGATION AND DEBRIDEMENT SEBACEOUS CYST;  Surgeon: Pedro Earls, MD;  Location: [facility];  Service: General;  Laterality: N/A;  middle of chest on sternum  . KIDNEY STONE SURGERY  05-09-11   past hx. and cystoscopy for bladder issues  . LAPAROSCOPIC NISSEN FUNDOPLICATION  09/14/3660   Procedure: LAPAROSCOPIC NISSEN FUNDOPLICATION;  Surgeon: Pedro Earls, MD;  Location: [facility];  Service: General;  Laterality: N/A;  will also remove sebaceous cyst of chest  wall  . LAPAROSCOPIC NISSEN FUNDOPLICATION  94/76/5465   Procedure: LAPAROSCOPIC NISSEN FUNDOPLICATION;  Surgeon: Pedro Earls, MD;  [facility]: [facility];  Service: General;  Laterality: N/A;  Re-do Laparoscopic Nissen   . SEPTOPLASTY    . TONSILLECTOMY  05-09-11   Tonsillectomy  . UMBILICAL HERNIA REPAIR  05/16/2011   Procedure: HERNIA REPAIR UMBILICAL ADULT;  Surgeon: Pedro Earls, MD;  [facility]: [facility];  Service: General;  Laterality: N/A;  with mesh   . VAGINAL HYSTERECTOMY     Social History   Social History Narrative   Lives with husband.   Caffeine use: occasional coke   Right handed   family history includes Alcohol abuse in her father; Alzheimer's disease in her mother; Bipolar disorder in her mother; Diabetes in her mother; Stroke in her mother; Ulcers in her  father and paternal grandmother.   Review of Systems As per HPI did have recent kidney stone problems last month.  Objective:   Physical Exam BP 122/74   Pulse 80   Ht 5\' 5"  (1.651 m)   Wt 182 lb 8 oz (82.8 kg)   BMI 30.37 kg/m  Obese elderly white woman no acute distress Eyes are anicteric The abdomen is obese soft and nontender without organomegaly or mass there are well-healed surgical scars bowel sounds are present  Rectal exam with Pricilla Riffle, LPN present demonstrates a normal anoderm slightly decreased resting involuntary tone, a positive anal wink was found.  Simulated defecation demonstrates appropriate abdominal contraction and may be some excessive descending of the rectum and incomplete relaxation.  She has an appropriate mood and affect She is alert and oriented x3   Data reviewed include labs, previous primary care note from May 9

## 2017-10-16 ENCOUNTER — Encounter: Payer: Self-pay | Admitting: Podiatry

## 2017-10-16 ENCOUNTER — Ambulatory Visit (INDEPENDENT_AMBULATORY_CARE_PROVIDER_SITE_OTHER): Payer: PPO

## 2017-10-16 ENCOUNTER — Ambulatory Visit: Payer: PPO | Admitting: Podiatry

## 2017-10-16 VITALS — BP 105/62 | HR 91 | Resp 16

## 2017-10-16 DIAGNOSIS — S9031XA Contusion of right foot, initial encounter: Secondary | ICD-10-CM

## 2017-10-16 DIAGNOSIS — N200 Calculus of kidney: Secondary | ICD-10-CM | POA: Insufficient documentation

## 2017-10-16 DIAGNOSIS — L923 Foreign body granuloma of the skin and subcutaneous tissue: Secondary | ICD-10-CM

## 2017-10-16 DIAGNOSIS — D649 Anemia, unspecified: Secondary | ICD-10-CM | POA: Insufficient documentation

## 2017-10-16 MED ORDER — DOXYCYCLINE HYCLATE 100 MG PO TABS: 100.0000 mg | ORAL_TABLET | Freq: Two times a day (BID) | ORAL | 0 refills | Status: DC

## 2017-10-16 NOTE — Progress Notes (Signed)
Subjective:  Patient ID: Stephanie Bell, female    DOB: [birth year],  MRN: [id number] HPI Chief Complaint  Patient presents with  . Foot Pain    Plantar heel right - patient states she fell last Monday, she caught her foot on the metal joint of the doorway on the floor, red, raised area that is really tender when pressed, no treatment  . New Patient (Initial Visit)    77 y.o. female presents with the above complaint.   ROS: Denies fever chills nausea vomiting muscle aches pains calf pain back pain chest pain shortness of breath.  Past Medical History:  Diagnosis Date  . ADD (attention deficit disorder)   . Anemia   . Esophageal stricture 11/04/2014  . GERD (gastroesophageal reflux disease)   . H/O hiatal hernia   . Hx: UTI (urinary tract infection)   . Interstitial cystitis   . Kidney stone   . Lumbago   . Palpitations 02/2017  . Paraesophageal hernia   . Pneumonia 05-09-11   2011 last time  . Stroke ([place])   . Weight loss   . Wound disruption, post-op, skin   . 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 BACK SURGERY  05-09-11   x 2 lumbar  . CHOLECYSTECTOMY  05-09-11   laparoscopic  . COLONOSCOPY  2002, 2008    rectal hyperplastic polyp 2002, diverticulosis 2008  . ESOPHAGOGASTRODUODENOSCOPY    . HERNIA REPAIR  05/16/2011   hiatal hernia  . HIATAL HERNIA REPAIR  02/09/2012   Procedure: LAPAROSCOPIC REPAIR OF HIATAL HERNIA;  Surgeon: Pedro Earls, MD;  [facility];  Service: General;  Laterality: N/A;  . INSERTION OF MESH  02/09/2012   Procedure: INSERTION OF MESH;  Surgeon: Pedro Earls, MD;  [facility];  Service: General;;  . IRRIGATION AND DEBRIDEMENT SEBACEOUS CYST  05/16/2011   Procedure: IRRIGATION AND DEBRIDEMENT SEBACEOUS CYST;  Surgeon: Pedro Earls, MD;  [facility];  Service: General;  Laterality: N/A;  middle of chest on sternum  . KIDNEY STONE SURGERY  05-09-11   past hx.  and cystoscopy for bladder issues  . LAPAROSCOPIC NISSEN FUNDOPLICATION  11/16/5619   Procedure: LAPAROSCOPIC NISSEN FUNDOPLICATION;  Surgeon: Pedro Earls, MD;  [facility];  Service: General;  Laterality: N/A;  will also remove sebaceous cyst of chest wall  . LAPAROSCOPIC NISSEN FUNDOPLICATION  30/86/5784   Procedure: LAPAROSCOPIC NISSEN FUNDOPLICATION;  Surgeon: Pedro Earls, MD;  [facility];  Service: General;  Laterality: N/A;  Re-do Laparoscopic Nissen   . SEPTOPLASTY    . TONSILLECTOMY  05-09-11   Tonsillectomy  . UMBILICAL HERNIA REPAIR  05/16/2011   Procedure: HERNIA REPAIR UMBILICAL ADULT;  Surgeon: Pedro Earls, MD;  [facility]: [facility];  Service: General;  Laterality: N/A;  with mesh   . VAGINAL HYSTERECTOMY      Current Outpatient Medications:  .  aspirin EC 325 MG tablet, Take 1 tablet (325 mg total) by mouth daily. (Patient taking differently: Take 81 mg by mouth daily. ), Disp: 30 tablet, Rfl: 0 .  bisacodyl (DULCOLAX) 5 MG EC tablet, Take by mouth., Disp: , Rfl:  .  Cholecalciferol (VITAMIN D3) 5000 UNITS CAPS, Take 5,000 Units by mouth daily., Disp: , Rfl:  .  Cranberry-Vitamin C 84-20 MG CAPS, Take 2 capsules by mouth daily., Disp: , Rfl:  .  doxycycline (VIBRA-TABS) 100 MG tablet, Take 1 tablet (100 mg total) by mouth  2 (two) times daily., Disp: 20 tablet, Rfl: 0 .  ibuprofen (ADVIL,MOTRIN) 200 MG tablet, Take 200 mg by mouth every 6 (six) hours as needed for headache or mild pain. , Disp: , Rfl:  .  Multiple Vitamins-Minerals (MULTIVITAMIN WOMEN 50+) TABS, Take 1 tablet by mouth daily., Disp: , Rfl:  .  ondansetron (ZOFRAN ODT) 4 MG disintegrating tablet, Take 1 tablet (4 mg total) by mouth every 8 (eight) hours as needed for nausea or vomiting., Disp: 20 tablet, Rfl: 0 .  oxyCODONE-acetaminophen (PERCOCET) 5-325 MG tablet, Take 1-2 tablets by mouth every 8 (eight) hours as needed., Disp: 20 tablet, Rfl: 0  Allergies  Allergen Reactions  . Benzocaine  Palpitations    Specific agent was Scandonest 2 %  . Epinephrine Palpitations and Other (See Comments)    Reaction:  Tachycardia   . Sulfa Antibiotics Swelling and Rash   Review of Systems Objective:   Vitals:   10/16/17 0826  BP: 105/62  Pulse: 91  Resp: 16    General: Well developed, nourished, in no acute distress, alert and oriented x3   Dermatological: Skin is warm, dry and supple bilateral. Nails x 10 are well maintained; remaining integument appears unremarkable at this time. There are no open sores, no preulcerative lesions, no rash or signs of infection present.  Small area of erythema with what appears to be retention of a foreign body lateral aspect of the right foot.  Mildly tender on palpation but I was able to remove a small piece of wood with some purulence in this area.  Vascular: Dorsalis Pedis artery and Posterior Tibial artery pedal pulses are 2/4 bilateral with immedate capillary fill time. Pedal hair growth present. No varicosities and no lower extremity edema present bilateral.   Neruologic: Grossly intact via light touch bilateral. Vibratory intact via tuning fork bilateral. Protective threshold with Semmes Wienstein monofilament intact to all pedal sites bilateral. Patellar and Achilles deep tendon reflexes 2+ bilateral. No Babinski or clonus noted bilateral.   Musculoskeletal: No gross boney pedal deformities bilateral. No pain, crepitus, or limitation noted with foot and ankle range of motion bilateral. Muscular strength 5/5 in all groups tested bilateral.  Gait: Unassisted, Nonantalgic.    Radiographs:  Radiographs taken today do not demonstrate any acute findings.  Assessment & Plan:   Assessment: Retention foreign body probable splinter  Plan: Removal foreign body recommended doxycycline for 7 days soak in Epsom salts and warm water watch for signs and symptoms of infection if there are any notify me immediately.     Arielis Leonhart T. Ewa Gentry, [place]

## 2017-10-31 ENCOUNTER — Ambulatory Visit (INDEPENDENT_AMBULATORY_CARE_PROVIDER_SITE_OTHER): Payer: PPO | Admitting: Internal Medicine

## 2017-10-31 ENCOUNTER — Encounter: Payer: Self-pay | Admitting: Internal Medicine

## 2017-10-31 VITALS — BP 130/80 | HR 80 | Ht 65.0 in | Wt 180.4 lb

## 2017-10-31 DIAGNOSIS — K5900 Constipation, unspecified: Secondary | ICD-10-CM

## 2017-10-31 DIAGNOSIS — K2100 Gastro-esophageal reflux disease with esophagitis, without bleeding: Secondary | ICD-10-CM

## 2017-10-31 NOTE — Patient Instructions (Signed)
We are glad your better.    I appreciate the opportunity to care for you. Silvano Rusk, MD, [facility]

## 2017-10-31 NOTE — Progress Notes (Signed)
Stephanie Bell 77 y.o. 13-Jul-1940 [id number]  Assessment & Plan:   Encounter Diagnoses  Name Primary?  . Constipation, unspecified constipation type Yes  . Gastroesophageal reflux disease with esophagitis     She will continue MiraLAX which has helped.  Return to see me as needed. GERD controlled by fundoplication.  Not on a PPI.   I appreciate the opportunity to care for this patient. CC: Maryland Pink, MD   Subjective:   Chief Complaint: Constipation which is improved  HPI The patient is here for follow-up of constipation and reports that daily MiraLAX is done a great job and she is defecating without difficulty.  Power pudding did not help her but it does a great job for her husband.   Unfortunately she had a recent fall and was bringing groceries into the garage and grabbed the door and could not hold onto the handle and fell.  Fortunately there was no serious injury.  Her husband will not let her carry the groceries in anymore.  She is still playing some tennis. Still pleased with her fundoplication results, redo surgery.  Allergies  Allergen Reactions  . Benzocaine Palpitations    Specific agent was Scandonest 2 %  . Epinephrine Palpitations and Other (See Comments)    Reaction:  Tachycardia   . Sulfa Antibiotics Swelling and Rash   Current Meds  Medication Sig  . aspirin EC 81 MG tablet Take 81 mg by mouth daily.  . Cholecalciferol (VITAMIN D3) 5000 UNITS CAPS Take 5,000 Units by mouth daily.  . Cranberry-Vitamin C 84-20 MG CAPS Take 2 capsules by mouth daily.  . Cyanocobalamin (VITAMIN B-12 PO) Take 1 tablet by mouth daily.  Marland Kitchen doxycycline (VIBRA-TABS) 100 MG tablet Take 1 tablet (100 mg total) by mouth 2 (two) times daily. (Patient taking differently: Take 100 mg by mouth 2 (two) times daily as needed. )  . ibuprofen (ADVIL,MOTRIN) 200 MG tablet Take 200 mg by mouth every 6 (six) hours as needed for headache or mild pain.   . Multiple Vitamins-Minerals  (MULTIVITAMIN WOMEN 50+) TABS Take 1 tablet by mouth daily.  . ondansetron (ZOFRAN ODT) 4 MG disintegrating tablet Take 1 tablet (4 mg total) by mouth every 8 (eight) hours as needed for nausea or vomiting.   Past Medical History:  Diagnosis Date  . ADD (attention deficit disorder)   . Anemia   . Esophageal stricture 11/04/2014  . GERD (gastroesophageal reflux disease)   . H/O hiatal hernia   . Hx: UTI (urinary tract infection)   . Interstitial cystitis   . Kidney stone   . Lumbago   . Palpitations 02/2017  . Paraesophageal hernia   . Pneumonia 05-09-11   2011 last time  . Stroke ([place])   . Weight loss   . Wound disruption, post-op, skin   . 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 BACK SURGERY  05-09-11   x 2 lumbar  . CHOLECYSTECTOMY  05-09-11   laparoscopic  . COLONOSCOPY  2002, 2008    rectal hyperplastic polyp 2002, diverticulosis 2008  . ESOPHAGOGASTRODUODENOSCOPY    . HERNIA REPAIR  05/16/2011   hiatal hernia  . HIATAL HERNIA REPAIR  02/09/2012   Procedure: LAPAROSCOPIC REPAIR OF HIATAL HERNIA;  Surgeon: Pedro Earls, MD;  Location: [facility];  Service: General;  Laterality: N/A;  . INSERTION OF MESH  02/09/2012   Procedure: INSERTION OF MESH;  Surgeon: Pedro Earls, MD;  Location: [facility];  Service: General;;  . IRRIGATION AND DEBRIDEMENT SEBACEOUS CYST  05/16/2011   Procedure: IRRIGATION AND DEBRIDEMENT SEBACEOUS CYST;  Surgeon: Pedro Earls, MD;  [facility]: [facility];  Service: General;  Laterality: N/A;  middle of chest on sternum  . LAPAROSCOPIC NISSEN FUNDOPLICATION  4/88/8916   Procedure: LAPAROSCOPIC NISSEN FUNDOPLICATION;  Surgeon: Pedro Earls, MD;  [facility];  Service: General;  Laterality: N/A;  will also remove sebaceous cyst of chest wall  . LAPAROSCOPIC NISSEN FUNDOPLICATION  94/50/3888   Procedure: LAPAROSCOPIC NISSEN FUNDOPLICATION;  Surgeon: Pedro Earls, MD;  [facility];   Service: General;  Laterality: N/A;  Re-do Laparoscopic Nissen   . SEPTOPLASTY    . TONSILLECTOMY  05-09-11   Tonsillectomy  . UMBILICAL HERNIA REPAIR  05/16/2011   Procedure: HERNIA REPAIR UMBILICAL ADULT;  Surgeon: Pedro Earls, MD;  [facility];  Service: General;  Laterality: N/A;  with mesh   . VAGINAL HYSTERECTOMY     Social History   Social History Narrative   Lives with husband.   Caffeine use: occasional coke   Right handed   Enjoys tennis   3 children   Daily wine no tobacco or drug use   family history includes Alcohol abuse in her father; Alzheimer's disease in her mother; Bipolar disorder in her mother; Diabetes in her mother; Stroke in her mother; Ulcers in her father and paternal grandmother.   Review of Systems As per HPI  Objective:   Physical Exam BP 130/80   Pulse 80   Ht 5\' 5"  (1.651 m)   Wt 180 lb 6.4 oz (81.8 kg)   BMI 30.02 kg/m  No acute distress   15 minutes time spent with patient > half in counseling coordination of care

## 2017-12-19 DIAGNOSIS — Z8673 Personal history of transient ischemic attack (TIA), and cerebral infarction without residual deficits: Secondary | ICD-10-CM | POA: Diagnosis not present

## 2017-12-19 DIAGNOSIS — M6281 Muscle weakness (generalized): Secondary | ICD-10-CM | POA: Diagnosis not present

## 2018-02-21 DIAGNOSIS — Z0000 Encounter for general adult medical examination without abnormal findings: Secondary | ICD-10-CM | POA: Diagnosis not present

## 2018-02-25 ENCOUNTER — Telehealth: Payer: Self-pay | Admitting: Interventional Cardiology

## 2018-02-25 NOTE — Progress Notes (Signed)
Cardiology Office Note Date:  02/27/2018  Patient ID:  Stephanie, Bell 1941-01-09, MRN [id number] PCP:  Maryland Pink, MD  Cardiologist:  Dr. Tamala Julian, MD    Chief Complaint: Chest pain and dizziness x 4-5 months  History of Present Illness: Stephanie Bell is a 77 y.o. female with history of left thalamic lacunar CVA in 2017, HTN, HLD, thyroid nodule, constipation, and GERD who presents for evaluation of chest pain and dizziness.   Patient was previously followed by Dr. Acie Fredrickson and underwent an echo in 01/2015 that showed an EF of 60-65%, no RWMA, Gr2DD, mild MR, trivial TR/PR. She also underwent a Lexiscan Myoview in 01/2015 that was normal. She was admitted to the hospital in 04/2015 with a left thalamic lacunar stroke. Echo in the setting showed an EF of 60-65%, mild MR. Carotid artery ultrasound showed no hemodynamically significant stenosis with incidental thyroid nodule noted. Outpatient cardiac monitoring showed NSR with occasional PVCs. No evidence of Afib. She was admitted to [facility] in 02/2017 with chest tightness and SOB. Cardiac enzymes were negative. Echo showed an EF of 55-60%, no RWMA, Gr1DD, no significant valvular disease. Lexiscan Myoview showed no reversible ischemia with an EF of 73%. Low risk study. BP was noted to be fluctuating and she was started on an ARB.  Patient was seen as a self referral by Dr. Tamala Julian in 04/2017 for medical record review. There were no active, specific cardiac complaints. It was recommended she continue to treat risk factors for her stroke.  She called our office on 12/9 noting a 4 month history of chest pain with associated fluctuating BP, nausea, fatigue, and palpitations. Symptoms seemed to be occurring more often and appointment was made.   Labs: 07/2017 - CBC unremarkable  02/2017 - LDL 127 (goal <70), A1c 5.5, K+ 3.6, SCr 0.77, AST/ALT normal, T bili 1.4  Patient comes in accompanied by her husband today.  She comes in stating for the  past 4 to 5 months she has had intermittent nonexertional chest pain.  Chest pain will come on when she is sitting watching TV or laying down.  Chest pain has also occurred in the early morning hours.  Symptoms are not associated with shortness of breath, palpitations, dizziness, presyncope, or syncope.  She does note some associated nausea.  Symptoms typically resolve after she belches and drinks her coffee.  Patient has been able to continue to play her regular tennis, most recently 2 weeks prior without any symptoms of chest pain or dizziness.  She states "I felt good playing tennis though did not play well."  She believes that she last had this chest pain a couple days prior and has been symptom-free since.  She also notes positional dizziness and weakness.  She does not drink much water throughout the day and drinks mainly Cokes.  She indicates she was told by an outside physician to "never throw up."  In this setting, she has been taking long-standing Zofran with good results.  She reports that she is almost out of Zofran and this is causing significant anxiety.  She requests a refill of Zofran at this time.  She has previously been prescribed HCTZ 25 mg half tab twice daily though she stopped this medication approximately 2 weeks prior as she was wondering if this was playing a role in some of her symptoms.  With cessation of this medication her symptoms have remained about the same.  No lower extremity swelling, orthopnea, PND, or early satiety.  Again, she is currently chest pain-free.  Past Medical History:  Diagnosis Date  . ADD (attention deficit disorder)   . Anemia   . Esophageal stricture 11/04/2014  . GERD (gastroesophageal reflux disease)   . H/O hiatal hernia   . Hx: UTI (urinary tract infection)   . Interstitial cystitis   . Kidney stone   . Lumbago   . Palpitations 02/2017  . Paraesophageal hernia   . Pneumonia 05-09-11   2011 last time  . Stroke ([place])   . Weight loss   . Wound  disruption, post-op, skin   . 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 BACK SURGERY  05-09-11   x 2 lumbar  . CHOLECYSTECTOMY  05-09-11   laparoscopic  . COLONOSCOPY  2002, 2008    rectal hyperplastic polyp 2002, diverticulosis 2008  . ESOPHAGOGASTRODUODENOSCOPY    . HERNIA REPAIR  05/16/2011   hiatal hernia  . HIATAL HERNIA REPAIR  02/09/2012   Procedure: LAPAROSCOPIC REPAIR OF HIATAL HERNIA;  Surgeon: Pedro Earls, MD;  Location: [facility];  Service: General;  Laterality: N/A;  . INSERTION OF MESH  02/09/2012   Procedure: INSERTION OF MESH;  Surgeon: Pedro Earls, MD;  Location: [facility];  Service: General;;  . IRRIGATION AND DEBRIDEMENT SEBACEOUS CYST  05/16/2011   Procedure: IRRIGATION AND DEBRIDEMENT SEBACEOUS CYST;  Surgeon: Pedro Earls, MD;  Location: [facility];  Service: General;  Laterality: N/A;  middle of chest on sternum  . LAPAROSCOPIC NISSEN FUNDOPLICATION  06/20/4740   Procedure: LAPAROSCOPIC NISSEN FUNDOPLICATION;  Surgeon: Pedro Earls, MD;  Location: [facility];  Service: General;  Laterality: N/A;  will also remove sebaceous cyst of chest wall  . LAPAROSCOPIC NISSEN FUNDOPLICATION  59/56/3875   Procedure: LAPAROSCOPIC NISSEN FUNDOPLICATION;  Surgeon: Pedro Earls, MD;  Location: [facility];  Service: General;  Laterality: N/A;  Re-do Laparoscopic Nissen   . SEPTOPLASTY    . TONSILLECTOMY  05-09-11   Tonsillectomy  . UMBILICAL HERNIA REPAIR  05/16/2011   Procedure: HERNIA REPAIR UMBILICAL ADULT;  Surgeon: Pedro Earls, MD;  Location: [facility];  Service: General;  Laterality: N/A;  with mesh   . VAGINAL HYSTERECTOMY      Current Meds  Medication Sig  . aspirin EC 81 MG tablet Take 81 mg by mouth daily.  . Cholecalciferol (VITAMIN D3) 5000 UNITS CAPS Take 5,000 Units by mouth daily.  . Cranberry-Vitamin C 84-20 MG CAPS Take 2 capsules by mouth daily.  . Cyanocobalamin (VITAMIN B-12 PO) Take 1 tablet by  mouth daily.  Marland Kitchen ibuprofen (ADVIL,MOTRIN) 200 MG tablet Take 200 mg by mouth every 6 (six) hours as needed for headache or mild pain.   . Multiple Vitamins-Minerals (MULTIVITAMIN WOMEN 50+) TABS Take 1 tablet by mouth daily.  . ondansetron (ZOFRAN ODT) 4 MG disintegrating tablet Take 1 tablet (4 mg total) by mouth every 8 (eight) hours as needed for nausea or vomiting.    Allergies:   Benzocaine; Epinephrine; and Sulfa antibiotics   Social History:  The patient  reports that she has never smoked. She has never used smokeless tobacco. She reports that she drinks about 1.0 standard drinks of alcohol per week. She reports that she does not use drugs.   Family History:  The patient's family history includes Alcohol abuse in her father; Alzheimer's disease in her mother; Bipolar disorder in her mother; Diabetes in her mother; Stroke in her mother; Ulcers in her  father and paternal grandmother.  ROS:   Review of Systems  Constitutional: Positive for malaise/fatigue. Negative for chills, diaphoresis, fever and weight loss.  HENT: Negative for congestion.   Eyes: Negative for discharge and redness.  Respiratory: Negative for cough, hemoptysis, sputum production, shortness of breath and wheezing.   Cardiovascular: Positive for chest pain. Negative for palpitations, orthopnea, claudication, leg swelling and PND.  Gastrointestinal: Positive for constipation and nausea. Negative for abdominal pain, blood in stool, diarrhea, heartburn, melena and vomiting.  Genitourinary: Negative for hematuria.  Musculoskeletal: Negative for falls and myalgias.  Skin: Negative for rash.  Neurological: Positive for dizziness and weakness. Negative for tingling, tremors, sensory change, speech change, focal weakness and loss of consciousness.  Endo/Heme/Allergies: Does not bruise/bleed easily.  Psychiatric/Behavioral: Negative for substance abuse. The patient is nervous/anxious.   All other systems reviewed and are  negative.    PHYSICAL EXAM:  VS:  BP 115/78 (BP Location: Left Arm, Patient Position: Sitting, Cuff Size: Normal)   Pulse 67   Ht 5\' 5"  (1.651 m)   Wt 178 lb (80.7 kg)   SpO2 96%   BMI 29.62 kg/m  BMI: Body mass index is 29.62 kg/m.  Physical Exam  Constitutional: She is oriented to person, place, and time. She appears well-developed and well-nourished.  HENT:  Head: Normocephalic and atraumatic.  Eyes: Right eye exhibits no discharge. Left eye exhibits no discharge.  Neck: Normal range of motion. No JVD present.  Cardiovascular: Normal rate, regular rhythm, S1 normal, S2 normal and normal heart sounds. Exam reveals no distant heart sounds, no friction rub, no midsystolic click and no opening snap.  No murmur heard. Pulses:      Dorsalis pedis pulses are 2+ on the right side, and 2+ on the left side.       Posterior tibial pulses are 2+ on the right side, and 2+ on the left side.  Pulmonary/Chest: Effort normal and breath sounds normal. No respiratory distress. She has no decreased breath sounds. She has no wheezes. She has no rales. She exhibits no tenderness.  Abdominal: Soft. She exhibits no distension. There is no tenderness.  Musculoskeletal: She exhibits no edema.  Neurological: She is alert and oriented to person, place, and time.  Skin: Skin is warm and dry. No cyanosis. Nails show no clubbing.  Psychiatric: She has a normal mood and affect. Her speech is normal and behavior is normal. Judgment and thought content normal.     EKG:  Was ordered and interpreted by me today. Shows NSR, 67 bpm, left axis deviation, nonspecific st/t changes  Recent Labs: 03/04/2017: B Natriuretic Peptide 43.1 07/22/2017: ALT 15; BUN 11; Creatinine, Ser 0.94; Hemoglobin 13.8; Platelets 123; Potassium 3.5; Sodium 138  03/05/2017: Cholesterol 199; HDL 49; LDL Cholesterol 127; Total CHOL/HDL Ratio 4.1; Triglycerides 117; VLDL 23   CrCl cannot be calculated (Patient's most recent lab result is  older than the maximum 21 days allowed.).   Wt Readings from Last 3 Encounters:  02/27/18 178 lb (80.7 kg)  10/31/17 180 lb 6.4 oz (81.8 kg)  08/23/17 182 lb 8 oz (82.8 kg)     Orthostatic Vital Signs: Lying: 136/89, 66 bpm Sitting: 116/84, 78 bpm, nauseated Standing: 94/62, 83 bpm, dizzy and leg weakness Standing x 3 minutes: 100/68, 84 bpm   Other studies reviewed: Additional studies/records reviewed today include: summarized above  ASSESSMENT AND PLAN:  1. Chest pain with typical and atypical features: Currently chest pain-free.  Detailed discussion with the patient and her  husband regarding invasive and noninvasive imaging modalities.  She prefers proceeding with cardiac CT.  Continue aspirin.  Aggressive risk factor modification and primary prevention.  Not on statin due to refusal as below.  2. Orthostatic hypotension: She is not on any antihypertensive medication.  She indicates she does not drink much water throughout the day and drinks mostly Cokes.  I advised her to increase her water intake and decrease Coke intake.  Positional changes slowly.  Should symptoms persist could consider pharmacological therapy.  3. History of stroke: Remains on ASA per neurology. It appears neurology wants the patient to be on ASA 325 mg daily, though her medication list has indicated she is taking ASA 81 mg daily. I have asked her to contact her neurologist's office for clarification. Continue to optimize risk factors. Keep follow up with neurology in early 2020.   4. HTN: Blood pressure is well controlled today not on any antihypertensives.  Managed by PCP.  5. HLD: Most recent LDL of 127 from 02/2017. Goal LDL < 70. Not currently on a statin in the setting of the patient has declined in the past.  She continues to decline statins.  6. Thyroid nodule: Incidentally noted in 04/2015 on carotid artery ultrasound.  She cannot remember if she was told of this prior.  I have advised her to discuss  this with her PCP, she agrees to do so.  7. Nausea: This has been a long-standing issue dating back to her hernia repair.  She requests refill of her Zofran which has been supplied for a one-time refill.  Further refills will need to come from patient's PCP.  Disposition: F/u with Dr. Tamala Julian or an APP in 3 months.  Current medicines are reviewed at length with the patient today.  The patient did not have any concerns regarding medicines.  Signed, Christell Faith, PA-C 02/27/2018 10:14 AM     [facility] [place], [place] [address] [phone]

## 2018-02-25 NOTE — Telephone Encounter (Signed)
SPOKE WITH PT AND HAS SEVERAL COMPLAINTS CHEST HURTING EVERY  FEW DAYS ALONG THE SAME TIME AS WELL AS NAUSEA ,B/P FLUCTUATING ,FATIGUE,SOB,AND HEART FLUTTERING THIS HAS BEEN GOING ON FOR 4 MONTHS OR SO BUT IS OCCURRING MORE OFTEN APPT MADE WITH RYAN DUNN AT [place] Wednesday AT 11:30 AM PT INSTRUCTED IF S/S WORSEN TO GO TO ED FOR EVAL AND TX PT AGREES .Adonis Housekeeper

## 2018-02-25 NOTE — Telephone Encounter (Signed)
Follow up    Continuation from note: Epic went out.  Then it will stop hurting and then a few days it will repeat. She states that now its become more and more frequent. Please call to discuss.

## 2018-02-25 NOTE — Telephone Encounter (Signed)
New Message    Pt c/o of Chest Pain: STAT if CP now or developed within 24 hours  1. Are you having CP right now? no  2. Are you experiencing any other symptoms (ex. SOB, nausea, vomiting, sweating)? Nausea   3. How long have you been experiencing CP? For about 4-5 months  4. Is your CP continuous or coming and going? Coming and going   5. Have you taken Nitroglycerin? Yes  Patient is calling because for the past 4-5 months between the hours of 3am - 5am she is awaken out of her sleep to what feels like chest pain and fast heartbeats. She states she experiences some nausea. Then it wi ?

## 2018-02-27 ENCOUNTER — Encounter: Payer: Self-pay | Admitting: Physician Assistant

## 2018-02-27 ENCOUNTER — Ambulatory Visit (INDEPENDENT_AMBULATORY_CARE_PROVIDER_SITE_OTHER): Payer: PPO | Admitting: Physician Assistant

## 2018-02-27 VITALS — BP 115/78 | HR 67 | Ht 65.0 in | Wt 178.0 lb

## 2018-02-27 DIAGNOSIS — I209 Angina pectoris, unspecified: Secondary | ICD-10-CM

## 2018-02-27 DIAGNOSIS — Z8673 Personal history of transient ischemic attack (TIA), and cerebral infarction without residual deficits: Secondary | ICD-10-CM | POA: Diagnosis not present

## 2018-02-27 DIAGNOSIS — I951 Orthostatic hypotension: Secondary | ICD-10-CM

## 2018-02-27 DIAGNOSIS — E785 Hyperlipidemia, unspecified: Secondary | ICD-10-CM

## 2018-02-27 DIAGNOSIS — R110 Nausea: Secondary | ICD-10-CM | POA: Diagnosis not present

## 2018-02-27 DIAGNOSIS — E041 Nontoxic single thyroid nodule: Secondary | ICD-10-CM

## 2018-02-27 DIAGNOSIS — I10 Essential (primary) hypertension: Secondary | ICD-10-CM

## 2018-02-27 MED ORDER — ONDANSETRON 4 MG PO TBDP: 4.0000 mg | ORAL_TABLET | Freq: Three times a day (TID) | ORAL | 0 refills | Status: DC | PRN

## 2018-02-27 MED ORDER — METOPROLOL TARTRATE 50 MG PO TABS: ORAL_TABLET | ORAL | 0 refills | Status: DC

## 2018-02-27 NOTE — Patient Instructions (Addendum)
Medication Instructions:  Your physician recommends that you continue on your current medications as directed. Please refer to the Current Medication list given to you today.  If you need a refill on your cardiac medications before your next appointment, please call your pharmacy.   Lab work: Your physician recommends that you return for lab work within 30 days of CT scan. Please wait until CT is scheduled before reporting to medical mall for    If you have labs (blood work) drawn today and your tests are completely normal, you will receive your results only by: Marland Kitchen MyChart Message (if you have MyChart) OR . A paper copy in the mail If you have any lab test that is abnormal or we need to change your treatment, we will call you to review the results.  Testing/Procedures: 1- Cardiac CT Please arrive at the [facility] main entrance of [facility] at xx:xx AM (30-45 minutes prior to test start time)  [address] [place],  [phone]  Proceed to the [facility] Radiology Department (First Floor).  Please follow these instructions carefully (unless otherwise directed):  On the Night Before the Test: . Be sure to Drink plenty of water. . Do not consume any caffeinated/decaffeinated beverages or chocolate 12 hours prior to your test. . Do not take any antihistamines 12 hours prior to your test.  On the Day of the Test: . Drink plenty of water. Do not drink any water within one hour of the test. . Do not eat any food 4 hours prior to the test. . You may take your regular medications prior to the test except HCTZ.  . Take metoprolol (Lopressor) two hours prior to test. . HOLD Furosemide/Hydrochlorothiazide morning of the test.       After the Test: . Drink plenty of water. . After receiving IV contrast, you may experience a mild flushed feeling. This is normal. . On occasion, you may experience a mild rash up to 24 hours after the  test. This is not dangerous. If this occurs, you can take Benadryl 25 mg and increase your fluid intake. . If you experience trouble breathing, this can be serious. If it is severe call 911 IMMEDIATELY. If it is mild, please call our office. . If you take any of these medications: Glipizide/Metformin, Avandament, Glucavance, please do not take 48 hours after completing test.   Follow-Up: At [facility], you and your health needs are our priority.  As part of our continuing mission to provide you with exceptional heart care, we have created designated Provider Care Teams.  These Care Teams include your primary Cardiologist (physician) and Advanced Practice Providers (APPs -  Physician Assistants and Nurse Practitioners) who all work together to provide you with the care you need, when you need it. You will need a follow up appointment in 3 months.  Please call our office 2 months in advance to schedule this appointment.  You may see Sinclair Grooms, MD or one of the following Advanced Practice Providers on your designated Care Team:   Murray Hodgkins, NP Christell Faith, PA-C . Marrianne Mood, PA-C  Any Other Special Instructions Will Be Listed Below (If Applicable). Per provider, please increase your daily intake of fluids.

## 2018-03-01 ENCOUNTER — Telehealth: Payer: Self-pay | Admitting: Physician Assistant

## 2018-03-01 NOTE — Telephone Encounter (Signed)
Pt is calling to check the status of her cardiac CT being scheduled. Please call to discuss

## 2018-03-01 NOTE — Telephone Encounter (Signed)
I spoke with the patient. She was seen on 02/27/18 by Christell Faith, PA and he ordered a Cardiac CT for her at that time.  I have advised the patient that it can sometimes take 2-3 weeks to even get a call to get scheduled to have this done as it goes through the pre-certification process with her insurance first.   The patient voices understanding of the above.

## 2018-03-13 DIAGNOSIS — R202 Paresthesia of skin: Secondary | ICD-10-CM | POA: Diagnosis not present

## 2018-03-13 DIAGNOSIS — I10 Essential (primary) hypertension: Secondary | ICD-10-CM | POA: Diagnosis not present

## 2018-03-13 DIAGNOSIS — R531 Weakness: Secondary | ICD-10-CM | POA: Diagnosis not present

## 2018-03-25 ENCOUNTER — Other Ambulatory Visit
Admission: RE | Admit: 2018-03-25 | Discharge: 2018-03-25 | Disposition: A | Discharge status: Home / Self Care | Payer: PPO | Source: Ambulatory Visit | Attending: Physician Assistant | Admitting: Physician Assistant

## 2018-03-25 DIAGNOSIS — I209 Angina pectoris, unspecified: Secondary | ICD-10-CM | POA: Diagnosis not present

## 2018-03-25 DIAGNOSIS — I10 Essential (primary) hypertension: Secondary | ICD-10-CM

## 2018-03-25 LAB — BASIC METABOLIC PANEL
Anion gap: 8 (ref 5–15)
BUN: 15 mg/dL (ref 8–23)
CO2: 26 mmol/L (ref 22–32)
Calcium: 9.1 mg/dL (ref 8.9–10.3)
Chloride: 105 mmol/L (ref 98–111)
Creatinine, Ser: 0.72 mg/dL (ref 0.44–1.00)
GFR calc Af Amer: >60 mL/min (ref >60)
GFR calc non Af Amer: >60 mL/min (ref >60)
Glucose, Bld: 108 mg/dL — ABNORMAL HIGH (ref 70–99)
Potassium: 3.8 mmol/L (ref 3.5–5.1)
Sodium: 139 mmol/L (ref 135–145)

## 2018-03-26 ENCOUNTER — Encounter: Payer: Self-pay | Admitting: Physician Assistant

## 2018-03-26 ENCOUNTER — Ambulatory Visit: Payer: PPO | Admitting: Physician Assistant

## 2018-03-26 ENCOUNTER — Encounter

## 2018-03-26 VITALS — BP 124/72 | HR 84 | Ht 65.0 in | Wt 173.0 lb

## 2018-03-26 DIAGNOSIS — F411 Generalized anxiety disorder: Secondary | ICD-10-CM | POA: Diagnosis not present

## 2018-03-26 DIAGNOSIS — R079 Chest pain, unspecified: Secondary | ICD-10-CM | POA: Diagnosis not present

## 2018-03-26 DIAGNOSIS — K219 Gastro-esophageal reflux disease without esophagitis: Secondary | ICD-10-CM | POA: Diagnosis not present

## 2018-03-26 DIAGNOSIS — R110 Nausea: Secondary | ICD-10-CM | POA: Diagnosis not present

## 2018-03-26 DIAGNOSIS — R634 Abnormal weight loss: Secondary | ICD-10-CM

## 2018-03-26 MED ORDER — ONDANSETRON 4 MG PO TBDP: ORAL_TABLET | ORAL | 6 refills | Status: DC

## 2018-03-26 NOTE — Patient Instructions (Signed)
We sent a prescription for refills to [facility].  1. Zofran ODT for nausea. Get Pepcid [facility] over the counter tablets at [place].Take 2 tablets twice daily as needed.   Keep your appointment with Dr. Kary Kos on 04-09-2018.  Please call us in the meantime if you are having worsening problems. Ask for Amy's nurse or Dr. Celesta Aver nurse.  Normal BMI (Body Mass Index- based on height and weight) is between 23 and 30. Your BMI today is Body mass index is 28.79 kg/m. Marland Kitchen Please consider follow up  regarding your BMI with your Primary Care Provider.

## 2018-03-27 ENCOUNTER — Telehealth: Payer: Self-pay | Admitting: Physician Assistant

## 2018-03-27 ENCOUNTER — Encounter: Payer: Self-pay | Admitting: Physician Assistant

## 2018-03-27 MED ORDER — SERTRALINE HCL 50 MG PO TABS: ORAL_TABLET | ORAL | 4 refills | Status: DC

## 2018-03-27 NOTE — Telephone Encounter (Signed)
Patient states she spoke with APP Amy yesterday 1.8.2020 at ov about having panic attacks and at the time did not think she needed medication. Patient states she is now having tightness in her chest and wants to know if Amy can call something in until she sees her pcp in 2 weeks.

## 2018-03-27 NOTE — Telephone Encounter (Signed)
See questions from the patient

## 2018-03-27 NOTE — Telephone Encounter (Signed)
Patient notified rx sent I spoke with Dr. Gorden Harms office [phone] they will have someone call back from Dr. Ewing Schlein office

## 2018-03-27 NOTE — Telephone Encounter (Signed)
Ok - please send rx for Zoloft 50 mg - she  should start with 1/2 tab daily  For one week then increase to one whole tab daily  #30 /4 refills .. her PCP can increase dose later if needed. This  Med may take a little time to kick in - be patient .  Also please call pt's PCP office - a Dr Maryland Pink in [place] and see if we can get her a sooner appt then she had previously scheduled - tell them significant anxiety/. Atypical chest pain - she is also scheduled for cardiac  CT next week.

## 2018-03-27 NOTE — Telephone Encounter (Signed)
Patient calling back again requesting to speak with someone.
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
## 2018-03-28 DIAGNOSIS — F419 Anxiety disorder, unspecified: Secondary | ICD-10-CM | POA: Diagnosis not present

## 2018-03-31 ENCOUNTER — Emergency Department
Admission: EM | Admit: 2018-03-31 | Discharge: 2018-03-31 | Disposition: A | Discharge status: Home / Self Care | Payer: PPO | Attending: Emergency Medicine | Admitting: Emergency Medicine

## 2018-03-31 ENCOUNTER — Encounter: Payer: Self-pay | Admitting: Emergency Medicine

## 2018-03-31 ENCOUNTER — Other Ambulatory Visit: Payer: Self-pay

## 2018-03-31 DIAGNOSIS — R110 Nausea: Secondary | ICD-10-CM

## 2018-03-31 DIAGNOSIS — D649 Anemia, unspecified: Secondary | ICD-10-CM | POA: Diagnosis not present

## 2018-03-31 DIAGNOSIS — R112 Nausea with vomiting, unspecified: Secondary | ICD-10-CM | POA: Insufficient documentation

## 2018-03-31 DIAGNOSIS — F419 Anxiety disorder, unspecified: Secondary | ICD-10-CM | POA: Insufficient documentation

## 2018-03-31 LAB — URINALYSIS, COMPLETE (UACMP) WITH MICROSCOPIC
Bacteria, UA: NONE SEEN
Bilirubin Urine: NEGATIVE
Glucose, UA: NEGATIVE mg/dL
HGB URINE DIPSTICK: NEGATIVE
Ketones, ur: 5 mg/dL — AB
NITRITE: NEGATIVE
Protein, ur: NEGATIVE mg/dL
Specific Gravity, Urine: 1.006 (ref 1.005–1.030)
pH: 7 (ref 5.0–8.0)

## 2018-03-31 LAB — CBC
HEMATOCRIT: 42.2 % (ref 36.0–46.0)
Hemoglobin: 14.2 g/dL (ref 12.0–15.0)
MCH: 29 pg (ref 26.0–34.0)
MCHC: 33.6 g/dL (ref 30.0–36.0)
MCV: 86.3 fL (ref 80.0–100.0)
Platelets: 163 10*3/uL (ref 150–400)
RBC: 4.89 MIL/uL (ref 3.87–5.11)
RDW: 12.8 % (ref 11.5–15.5)
WBC: 6.3 10*3/uL (ref 4.0–10.5)
nRBC: 0 % (ref 0.0–0.2)

## 2018-03-31 LAB — COMPREHENSIVE METABOLIC PANEL
ALT: 16 U/L (ref 0–44)
AST: 23 U/L (ref 15–41)
Albumin: 4.4 g/dL (ref 3.5–5.0)
Alkaline Phosphatase: 61 U/L (ref 38–126)
Anion gap: 11 (ref 5–15)
BUN: 12 mg/dL (ref 8–23)
CO2: 26 mmol/L (ref 22–32)
Calcium: 9.7 mg/dL (ref 8.9–10.3)
Chloride: 100 mmol/L (ref 98–111)
Creatinine, Ser: 1.11 mg/dL — ABNORMAL HIGH (ref 0.44–1.00)
GFR calc Af Amer: 55 mL/min — ABNORMAL LOW (ref >60)
GFR calc non Af Amer: 48 mL/min — ABNORMAL LOW (ref >60)
GLUCOSE: 111 mg/dL — AB (ref 70–99)
Potassium: 3.8 mmol/L (ref 3.5–5.1)
SODIUM: 137 mmol/L (ref 135–145)
Total Bilirubin: 1.4 mg/dL — ABNORMAL HIGH (ref 0.3–1.2)
Total Protein: 7.1 g/dL (ref 6.5–8.1)

## 2018-03-31 LAB — LIPASE, BLOOD: LIPASE: 21 U/L (ref 11–51)

## 2018-03-31 MED ORDER — PROMETHAZINE HCL 25 MG/ML IJ SOLN
25.0000 mg | Freq: Once | INTRAMUSCULAR | Status: AC
  Administered 2018-03-31: 25 mg via INTRAVENOUS
  Filled 2018-03-31: qty 1

## 2018-03-31 MED ORDER — SODIUM CHLORIDE 0.9 % IV BOLUS
1000.0000 mL | Freq: Once | INTRAVENOUS | Status: AC
  Administered 2018-03-31: 1000 mL via INTRAVENOUS

## 2018-03-31 MED ORDER — ONDANSETRON HCL 4 MG/2ML IJ SOLN: 4.0000 mg | Freq: Once | INTRAMUSCULAR | Status: DC | PRN

## 2018-03-31 MED ORDER — PROMETHAZINE HCL 25 MG PO TABS: 25.0000 mg | ORAL_TABLET | Freq: Three times a day (TID) | ORAL | 0 refills | Status: DC | PRN

## 2018-03-31 MED ORDER — PROMETHAZINE HCL 25 MG RE SUPP: 25.0000 mg | Freq: Four times a day (QID) | RECTAL | 0 refills | Status: DC | PRN

## 2018-03-31 NOTE — ED Provider Notes (Signed)
[facility] Emergency Department Provider Note  ____________________________________________  Time seen: Approximately 10:33 AM  I have reviewed the triage vital signs and the nursing notes.   HISTORY  Chief Complaint Nausea    HPI Stephanie Bell is a 78 y.o. female history of hiatal hernia status post repair, anxiety, chronic nausea, presenting with "extreme nausea."  The patient reports that for the past 3 weeks, she has had intermittent nausea that comes and goes.  She usually treats this with Zofran.  She has seen her GI physician 5 days ago, but felt that she was getting better after eating yogurt so no changes were made.  The following day, she was seen by her PCP due to extreme anxiety that was worsening her nausea and she was started on sertraline.  She took 1 day of dosing and developed nausea and vomiting that was severe and has since stopped that medication.  She reports that last night, she woke up at 2 AM with severe nausea and has taken 3 Zofran since then but continues to feel nauseated.  She is not having any abdominal pain, fevers or chills, constipation or diarrhea.  She does have some mild anxiety.  Her symptoms today are not new in character; the main difference is that they are not responding to Zofran.  Past Medical History:  Diagnosis Date  . ADD (attention deficit disorder)   . Anemia   . Esophageal stricture 11/04/2014  . GERD (gastroesophageal reflux disease)   . H/O hiatal hernia   . Hx: UTI (urinary tract infection)   . Interstitial cystitis   . Kidney stone   . Lumbago   . Palpitations 02/2017  . Paraesophageal hernia   . Pneumonia 05-09-11   2011 last time  . Stroke ([place])   . Weight loss   . Wound disruption, post-op, skin   . 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 Anemia, unspecified 10/16/2017  . Kidney stone 10/16/2017  . 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  Essential hypertension   . Atypical chest pain   . Chest pain 03/04/2017  . 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 Lacunar stroke 05/14/2015  . Elevated troponin 01/22/2015  . Esophageal stricture 11/04/2014  . GERD (gastroesophageal reflux disease) 11/04/2014  . 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 Hand pain 05/01/2013  . Sebaceous cyst of breast 12/20/2011  . S/P Nissen fundoplication (without gastrostomy tube) procedure 06/08/2011    Past Surgical History:  Procedure Laterality Date  . BACK SURGERY  05-09-11   x 2 lumbar  . CHOLECYSTECTOMY  05-09-11   laparoscopic  . COLONOSCOPY  2002, 2008    rectal hyperplastic polyp 2002, diverticulosis 2008  . ESOPHAGOGASTRODUODENOSCOPY    . HERNIA REPAIR  05/16/2011   hiatal hernia  . HIATAL HERNIA REPAIR  02/09/2012   Procedure: LAPAROSCOPIC REPAIR OF HIATAL HERNIA;  Surgeon: Pedro Earls, MD;  Location: [facility];  Service: General;  Laterality: N/A;  . INSERTION OF MESH  02/09/2012   Procedure: INSERTION OF MESH;  Surgeon: Pedro Earls, MD;  Location: [facility];  Service: General;;  . IRRIGATION AND DEBRIDEMENT SEBACEOUS CYST  05/16/2011   Procedure: IRRIGATION AND DEBRIDEMENT SEBACEOUS CYST;  Surgeon: Pedro Earls, MD;  Location: [facility];  Service: General;  Laterality: N/A;  middle of chest on sternum  . LAPAROSCOPIC NISSEN FUNDOPLICATION  9/48/5462  Procedure: LAPAROSCOPIC NISSEN FUNDOPLICATION;  Surgeon: Pedro Earls, MD;  Location: [facility];  Service: General;  Laterality: N/A;  will also remove sebaceous cyst of chest wall  . LAPAROSCOPIC NISSEN FUNDOPLICATION  30/86/5784   Procedure: LAPAROSCOPIC NISSEN FUNDOPLICATION;  Surgeon: Pedro Earls, MD;  Location: [facility];  Service: General;  Laterality: N/A;  Re-do Laparoscopic Nissen   . SEPTOPLASTY    . TONSILLECTOMY  05-09-11   Tonsillectomy  . UMBILICAL HERNIA REPAIR  05/16/2011   Procedure:  HERNIA REPAIR UMBILICAL ADULT;  Surgeon: Pedro Earls, MD;  Location: [facility];  Service: General;  Laterality: N/A;  with mesh   . VAGINAL HYSTERECTOMY      Current Outpatient Rx  . Order #: [id number] Class: Historical Med  . Order #: [id number] Class: Historical Med  . Order #: [id number] Class: Historical Med  . Order #: [id number] Class: Historical Med  . Order #: [id number] Class: Historical Med  . Order #: [id number] Class: Normal  . Order #: [id number] Class: Historical Med  . Order #: [id number] Class: Normal  . Order #: [id number] Class: Print  . Order #: [id number] Class: Print  . Order #: [id number] Class: Normal    Allergies Benzocaine; Epinephrine; Omeprazole; and Sulfa antibiotics  Family History  Problem Relation Age of Onset  . Ulcers Father   . Alcohol abuse Father   . Alzheimer's disease Mother   . Stroke Mother   . Diabetes Mother   . Bipolar disorder Mother   . Ulcers Paternal Grandmother   . Colon cancer Neg Hx     Social History Social History   Tobacco Use  . Smoking status: Never Smoker  . Smokeless tobacco: Never Used  Substance Use Topics  . Alcohol use: Not Currently    Alcohol/week: 1.0 standard drinks    Types: 1 Glasses of wine per week    Comment: Daily 4oz  . Drug use: No    Review of Systems Constitutional: No fever/chills.  No lightheadedness or syncope. Eyes: No visual changes. ENT: No sore throat. No congestion or rhinorrhea. Cardiovascular: Denies chest pain. Denies palpitations. Respiratory: Denies shortness of breath.  No cough. Gastrointestinal: No abdominal pain.  Positive nausea, no vomiting.  No diarrhea.  No constipation. Genitourinary: Negative for dysuria.  No urinary frequency. Musculoskeletal: Negative for back pain. Skin: Negative for rash. Neurological: Negative for headaches. No focal numbness, tingling or weakness.  Psychiatric:Positive anxiety.  ____________________________________________   PHYSICAL EXAM:  VITAL  SIGNS: ED Triage Vitals [03/31/18 1004]  Enc Vitals Group     BP      Pulse      Resp      Temp      Temp src      SpO2      Weight 169 lb (76.7 kg)     Height 5\' 5"  (1.651 m)     Head Circumference      Peak Flow      Pain Score 9     Pain Loc      Pain Edu?      Excl. in [place]?     Constitutional: Alert and oriented. Answers questions appropriately.  Anxious appearing. Eyes: Conjunctivae are normal.  EOMI. No scleral icterus. Head: Atraumatic. Nose: No congestion/rhinnorhea. Mouth/Throat: Mucous membranes are moist.  Neck: No stridor.  Supple.   Cardiovascular: Normal rate, regular rhythm. No murmurs, rubs or gallops.  Respiratory: Normal respiratory effort.  No accessory muscle use or retractions. Lungs CTAB.  No wheezes, rales or ronchi. Gastrointestinal: Soft, nontender and nondistended.  No  guarding or rebound.  No peritoneal signs. Musculoskeletal: No LE edema.  Neurologic:  A&Ox3.  Speech is clear.  Face and smile are symmetric.  EOMI.  Moves all extremities well. Skin:  Skin is warm, dry and intact. No rash noted. Psychiatric: The patient has a depressed mood and anxious affect.  ____________________________________________   LABS (all labs ordered are listed, but only abnormal results are displayed)  Labs Reviewed  COMPREHENSIVE METABOLIC PANEL - Abnormal; Notable for the following components:      Result Value   Glucose, Bld 111 (*)    Creatinine, Ser 1.11 (*)    Total Bilirubin 1.4 (*)    GFR calc non Af Amer 48 (*)    GFR calc Af Amer 55 (*)    All other components within normal limits  URINALYSIS, COMPLETE (UACMP) WITH MICROSCOPIC - Abnormal; Notable for the following components:   Color, Urine YELLOW (*)    APPearance CLEAR (*)    Ketones, ur 5 (*)    Leukocytes, UA SMALL (*)    All other components within normal limits  LIPASE, BLOOD  CBC   ____________________________________________  EKG  ED ECG REPORT I, Anne-Caroline Mariea Clonts, the attending  physician, personally viewed and interpreted this ECG.   Date: 03/31/2018  EKG Time: 1007  Rate: 79  Rhythm: normal sinus rhythm  Axis: leftward  Intervals:none  ST&T Change: No STEMI  ____________________________________________  RADIOLOGY  No results found.  ____________________________________________   PROCEDURES  Procedure(s) performed: None  Procedures  Critical Care performed: No ____________________________________________   INITIAL IMPRESSION / ASSESSMENT AND PLAN / ED COURSE  Pertinent labs & imaging results that were available during my care of the patient were reviewed by me and considered in my medical decision making (see chart for details).  78 y.o. female with a history of hiatal hernia status post repair, chronic nausea without vomiting, presenting for extreme nausea without vomiting that is not responsive to Zofran.  Overall, the patient is hemodynamically stable and afebrile.  Her abdominal examination is reassuring and she has no evidence of an acute pathology either surgical or infectious at this time.  I anticipate the symptoms are due to her chronic nausea and perhaps exacerbated by her anxiety.  However, we will check basic laboratory studies, troponin and screening EKG, and treat her with Phenergan today.  Plan reevaluation for final disposition.  ----------------------------------------- 11:36 AM on 03/31/2018 -----------------------------------------  Patient's work-up in the emergency department is reassuring.  Her laboratory studies are reassuring and her EKG does not show any abnormalities.  The patient's nausea has improved and we will plan to discharge her home with Phenergan tablets and suppositories as needed.  The patient will make a follow-up appoint with her primary care physician as well as with her GI physician in [place].  Follow-up instructions as well as return precautions were  discussed ____________________________________________  FINAL CLINICAL IMPRESSION(S) / ED DIAGNOSES  Final diagnoses:  Nausea without vomiting  Anxiety         NEW MEDICATIONS STARTED DURING THIS VISIT:  New Prescriptions   PROMETHAZINE (PHENERGAN) 25 MG SUPPOSITORY    Place 1 suppository (25 mg total) rectally every 6 (six) hours as needed for nausea.   PROMETHAZINE (PHENERGAN) 25 MG TABLET    Take 1 tablet (25 mg total) by mouth every 8 (eight) hours as needed for nausea or vomiting.      Eula Listen, MD 03/31/18 1137

## 2018-03-31 NOTE — Discharge Instructions (Addendum)
You may take Zofran or Phenergan for your nausea.  Please take a bland diet to decrease the chances of nausea or vomiting.  Please make an appointment with your GI doctor in [place] to be reevaluated for your worsening nausea.  Return to the emergency department if you develop severe pain, lightheadedness or fainting, fever, or any other symptoms concerning to you.

## 2018-03-31 NOTE — ED Triage Notes (Signed)
Nausea for several months, last week saw GI for same.  Seen on. Today with "extreme nausea"

## 2018-04-01 ENCOUNTER — Telehealth: Payer: Self-pay | Admitting: Physician Assistant

## 2018-04-01 DIAGNOSIS — H5203 Hypermetropia, bilateral: Secondary | ICD-10-CM | POA: Diagnosis not present

## 2018-04-01 DIAGNOSIS — H2513 Age-related nuclear cataract, bilateral: Secondary | ICD-10-CM | POA: Diagnosis not present

## 2018-04-01 DIAGNOSIS — H52223 Regular astigmatism, bilateral: Secondary | ICD-10-CM | POA: Diagnosis not present

## 2018-04-01 DIAGNOSIS — H25013 Cortical age-related cataract, bilateral: Secondary | ICD-10-CM | POA: Diagnosis not present

## 2018-04-01 DIAGNOSIS — H524 Presbyopia: Secondary | ICD-10-CM | POA: Diagnosis not present

## 2018-04-01 NOTE — Telephone Encounter (Signed)
Patient states that she had a "terrible weekend" due to chronic nausea, ended up at the ED where they gave her an IV that helped. Patient wants to discuss medication with nurse.

## 2018-04-01 NOTE — Telephone Encounter (Signed)
Pt returning your call

## 2018-04-01 NOTE — Telephone Encounter (Signed)
Today is a good day. She is taking Phenergan given to her at the ER. She is maintaining hydration and will also try for simple nutrition like

## 2018-04-01 NOTE — Telephone Encounter (Signed)
Patient returning nurses call

## 2018-04-01 NOTE — Telephone Encounter (Signed)
Left message to call back again 

## 2018-04-01 NOTE — Telephone Encounter (Signed)
Today is a good day. She has taken phenergan. She is not having any nausea. Maintaining hydration and nutrition with symptoms. Patient wants to know if she can have an upper GI or further investigation into a possible GI cause for her symptoms. She has seen the PCP.

## 2018-04-02 NOTE — Telephone Encounter (Signed)
Please schedule for Ct adb/pelvis with Iv and oral contrast . Ok to continue Phenergan 12.5 mg q 6 hrs prn for nausea if Zofran was not effective

## 2018-04-02 NOTE — Telephone Encounter (Signed)
Patient calling back requesting to speak with [facility].

## 2018-04-03 ENCOUNTER — Telehealth (HOSPITAL_COMMUNITY): Payer: Self-pay | Admitting: Emergency Medicine

## 2018-04-03 ENCOUNTER — Other Ambulatory Visit: Payer: Self-pay

## 2018-04-03 DIAGNOSIS — R634 Abnormal weight loss: Secondary | ICD-10-CM

## 2018-04-03 DIAGNOSIS — R112 Nausea with vomiting, unspecified: Secondary | ICD-10-CM

## 2018-04-03 MED ORDER — PROMETHAZINE HCL 25 MG PO TABS: 12.5000 mg | ORAL_TABLET | Freq: Three times a day (TID) | ORAL | 1 refills | Status: DC | PRN

## 2018-04-03 NOTE — Telephone Encounter (Signed)
Reaching out to patient to offer assistance regarding upcoming cardiac imaging study; pt verbalizes understanding of appt date/time, parking situation and where to check in, pre-test NPO status and medications ordered, and verified current allergies; name and call back number provided for further questions should they arise Patient states she has extreme nausea and is taking phenergan. Marchia Bond RN Navigator Cardiac Imaging [phone]

## 2018-04-03 NOTE — Telephone Encounter (Signed)
Spoke with the patient. CT abd/pelvis this Friday 04/05/2018 at [facility] arrive 2:15 pm. Patient aware. She will pick up instructions on the CT and her contrast tomorrow.

## 2018-04-04 ENCOUNTER — Ambulatory Visit (HOSPITAL_COMMUNITY)
Admission: RE | Admit: 2018-04-04 | Discharge: 2018-04-04 | Disposition: A | Discharge status: Home / Self Care | Payer: PPO | Source: Ambulatory Visit | Attending: Physician Assistant | Admitting: Physician Assistant

## 2018-04-04 DIAGNOSIS — I209 Angina pectoris, unspecified: Secondary | ICD-10-CM

## 2018-04-04 MED ORDER — NITROGLYCERIN 0.4 MG SL SUBL
0.8000 mg | SUBLINGUAL_TABLET | Freq: Once | SUBLINGUAL | Status: DC
  Filled 2018-04-04: qty 25

## 2018-04-04 MED ORDER — NITROGLYCERIN 0.4 MG SL SUBL
SUBLINGUAL_TABLET | SUBLINGUAL | Status: AC
  Filled 2018-04-04: qty 2

## 2018-04-04 MED ORDER — IOPAMIDOL (ISOVUE-370) INJECTION 76%
100.0000 mL | Freq: Once | INTRAVENOUS | Status: AC | PRN
  Administered 2018-04-04: 100 mL via INTRAVENOUS

## 2018-04-05 ENCOUNTER — Ambulatory Visit (HOSPITAL_COMMUNITY)
Admission: RE | Admit: 2018-04-05 | Discharge: 2018-04-05 | Disposition: A | Discharge status: Home / Self Care | Payer: PPO | Source: Ambulatory Visit | Attending: Physician Assistant | Admitting: Physician Assistant

## 2018-04-05 DIAGNOSIS — K5730 Diverticulosis of large intestine without perforation or abscess without bleeding: Secondary | ICD-10-CM | POA: Diagnosis not present

## 2018-04-05 DIAGNOSIS — R112 Nausea with vomiting, unspecified: Secondary | ICD-10-CM | POA: Insufficient documentation

## 2018-04-05 DIAGNOSIS — R634 Abnormal weight loss: Secondary | ICD-10-CM

## 2018-04-05 DIAGNOSIS — K449 Diaphragmatic hernia without obstruction or gangrene: Secondary | ICD-10-CM | POA: Diagnosis not present

## 2018-04-05 MED ORDER — IOHEXOL 300 MG/ML  SOLN
100.0000 mL | Freq: Once | INTRAMUSCULAR | Status: AC | PRN
  Administered 2018-04-05: 100 mL via INTRAVENOUS

## 2018-04-05 MED ORDER — SODIUM CHLORIDE (PF) 0.9 % IJ SOLN
INTRAMUSCULAR | Status: AC
  Filled 2018-04-05: qty 50

## 2018-04-10 ENCOUNTER — Telehealth: Payer: Self-pay | Admitting: Physician Assistant

## 2018-04-10 NOTE — Telephone Encounter (Signed)
Spoke with the patient at length. Reviewed her CT in detail. She is having reflux issues. Complains that she had such a burning in her chest after refluxing when she had laid down in the bed. Her pharmacist recommended Nexium OTC and Maalox. She has taken Nexium for 2 days. Thinks she may have some improvement. Understands how to take Nexium. Using the Maalox PRN. I discussed Reflux management with her. She was already aware of most of the guidelines. Agreed to talk again in a week to be certain she is improving.

## 2018-04-10 NOTE — Telephone Encounter (Signed)
Pt called to enquire about her CT results from 1.17.20

## 2018-04-11 NOTE — Telephone Encounter (Signed)
ok 

## 2018-04-17 ENCOUNTER — Telehealth: Payer: Self-pay | Admitting: Physician Assistant

## 2018-04-17 NOTE — Telephone Encounter (Signed)
The patient asked about getting refills for the Promethazine tablets. She also thought we would be ordering an Upper GI on her. Stephanie Bell reviewed the recent CT scan results with her.   The patient was fine with that but says she wants her esophagus checked out.  She has had problems since the Nissen Fundoplication, that had ruptured at some point.

## 2018-04-18 ENCOUNTER — Other Ambulatory Visit: Payer: Self-pay | Admitting: Internal Medicine

## 2018-04-18 ENCOUNTER — Telehealth: Payer: Self-pay | Admitting: Physician Assistant

## 2018-04-18 ENCOUNTER — Other Ambulatory Visit: Payer: Self-pay

## 2018-04-18 DIAGNOSIS — Z9889 Other specified postprocedural states: Secondary | ICD-10-CM

## 2018-04-18 DIAGNOSIS — R079 Chest pain, unspecified: Secondary | ICD-10-CM

## 2018-04-18 DIAGNOSIS — K219 Gastro-esophageal reflux disease without esophagitis: Secondary | ICD-10-CM

## 2018-04-18 NOTE — Telephone Encounter (Signed)
Stephanie Bell may I refill this?

## 2018-04-18 NOTE — Telephone Encounter (Signed)
Please call pt and get her scheduled for a Barium swallow  To evaluate esophagus - atypical CP, prior Nissen fundoplication x 2

## 2018-04-18 NOTE — Telephone Encounter (Signed)
Patient advised to go to [facility] Tuesday 04/23/2018 arriving at 9:45 am. Fast starting at 7:00 am. She agrees to this appointment for the barium swallow.

## 2018-04-19 ENCOUNTER — Other Ambulatory Visit: Payer: Self-pay | Admitting: *Deleted

## 2018-04-19 ENCOUNTER — Telehealth: Payer: Self-pay | Admitting: *Deleted

## 2018-04-19 NOTE — Telephone Encounter (Signed)
Called in script for Promethazine 25 mg, take 0.5 ( 1/2) tab every 8 hours as needed for nausea & vomiting. #30 with 1 refiill. Prescriber Amy Esterwood PA/ PP

## 2018-04-19 NOTE — Telephone Encounter (Signed)
Ok to refill 

## 2018-04-19 NOTE — Telephone Encounter (Signed)
This has already been called in by Marisue Humble, CMA.

## 2018-04-23 ENCOUNTER — Telehealth: Payer: Self-pay | Admitting: Physician Assistant

## 2018-04-23 ENCOUNTER — Ambulatory Visit
Admission: RE | Admit: 2018-04-23 | Discharge: 2018-04-23 | Disposition: A | Discharge status: Home / Self Care | Payer: PPO | Source: Ambulatory Visit | Attending: Physician Assistant | Admitting: Physician Assistant

## 2018-04-23 DIAGNOSIS — K219 Gastro-esophageal reflux disease without esophagitis: Secondary | ICD-10-CM | POA: Insufficient documentation

## 2018-04-23 DIAGNOSIS — Z9889 Other specified postprocedural states: Secondary | ICD-10-CM | POA: Diagnosis not present

## 2018-04-23 DIAGNOSIS — F419 Anxiety disorder, unspecified: Secondary | ICD-10-CM | POA: Diagnosis not present

## 2018-04-23 DIAGNOSIS — R079 Chest pain, unspecified: Secondary | ICD-10-CM | POA: Insufficient documentation

## 2018-04-23 DIAGNOSIS — K449 Diaphragmatic hernia without obstruction or gangrene: Secondary | ICD-10-CM | POA: Diagnosis not present

## 2018-04-23 NOTE — Telephone Encounter (Signed)
She is calling for the recommendations on this. You can send it to me the usual way and complete this message. Thanks

## 2018-04-24 NOTE — Telephone Encounter (Signed)
Patient advised that the results have not been reviewed by her provider.

## 2018-04-24 NOTE — Telephone Encounter (Signed)
Pt called back following up on the [facility] Test..

## 2018-04-26 ENCOUNTER — Telehealth: Payer: Self-pay | Admitting: Internal Medicine

## 2018-04-26 NOTE — Telephone Encounter (Signed)
Pt will like a call to discuss her results from her testing.

## 2018-04-26 NOTE — Telephone Encounter (Signed)
Patient saw Amy

## 2018-04-29 NOTE — Telephone Encounter (Signed)
She is aware of the recommendations from Dr Carlean Purl. She thanks him and Nicoletta Ba, [facility] for helping her with this. She has better control of her symptoms with Nexium and the "tips" on reflux that was emailed to her. She will consult with her surgeon and decide on how to proceed.

## 2018-04-29 NOTE — Telephone Encounter (Signed)
Pt reported that she has an appt with her surgeon for hernia May 08, 2018.  Pt would like to have report from barium swallow sent to Dr. Johnathan Hausen.  Pt requested a call back.

## 2018-05-07 DIAGNOSIS — Z8719 Personal history of other diseases of the digestive system: Secondary | ICD-10-CM | POA: Diagnosis not present

## 2018-05-07 DIAGNOSIS — R198 Other specified symptoms and signs involving the digestive system and abdomen: Secondary | ICD-10-CM | POA: Diagnosis not present

## 2018-05-08 DIAGNOSIS — K219 Gastro-esophageal reflux disease without esophagitis: Secondary | ICD-10-CM | POA: Diagnosis not present

## 2018-05-29 ENCOUNTER — Ambulatory Visit: Payer: PPO | Admitting: Interventional Cardiology

## 2018-05-29 ENCOUNTER — Encounter: Payer: Self-pay | Admitting: Interventional Cardiology

## 2018-05-29 ENCOUNTER — Other Ambulatory Visit: Payer: Self-pay

## 2018-05-29 VITALS — BP 82/52 | HR 91 | Ht 65.0 in | Wt 151.4 lb

## 2018-05-29 DIAGNOSIS — R0789 Other chest pain: Secondary | ICD-10-CM

## 2018-05-29 DIAGNOSIS — I10 Essential (primary) hypertension: Secondary | ICD-10-CM | POA: Diagnosis not present

## 2018-05-29 DIAGNOSIS — I9589 Other hypotension: Secondary | ICD-10-CM | POA: Diagnosis not present

## 2018-05-29 DIAGNOSIS — I63212 Cerebral infarction due to unspecified occlusion or stenosis of left vertebral arteries: Secondary | ICD-10-CM

## 2018-05-29 DIAGNOSIS — E861 Hypovolemia: Secondary | ICD-10-CM

## 2018-05-29 DIAGNOSIS — I2510 Atherosclerotic heart disease of native coronary artery without angina pectoris: Secondary | ICD-10-CM

## 2018-05-29 DIAGNOSIS — I6381 Other cerebral infarction due to occlusion or stenosis of small artery: Secondary | ICD-10-CM

## 2018-05-29 DIAGNOSIS — I6322 Cerebral infarction due to unspecified occlusion or stenosis of basilar arteries: Secondary | ICD-10-CM | POA: Diagnosis not present

## 2018-05-29 NOTE — Progress Notes (Signed)
Cardiology Office Note:    Date:  05/29/2018   ID:  Stephanie Bell 15-Jul-1940, MRN [id number]  PCP:  Maryland Pink, MD  Cardiologist:  Sinclair Grooms, MD   Referring MD: Maryland Pink, MD   Chief Complaint  Patient presents with  . Advice Only    Dizziness  . Chest Pain    History of Present Illness:    Stephanie Bell is a 78 y.o. female with a hx of  left thalamic lacunar CVA in 2017, HTN, HLD, thyroid nodule, constipation, and GERD who presents for evaluation of chest pain and dizziness.   Upon coming into the office today, she felt very weak and slightly nauseated.  Has felt weak all morning.  Came into the examining room in a wheelchair.  Blood pressure 82/52 mmHg.  Recumbent all of the exam table the blood pressure is 120/62.  She feels better lying down.  No ongoing chest discomfort are other complaint.  Nausea immediately better with lying.  Evaluation for chest discomfort led eventually to coronary CTA with FFR which demonstrated no evidence of significant obstructive disease.  She has nonobstructive CAD that was well documented by a high quality/good study.  We discussed the results of this study today.    Past Medical History:  Diagnosis Date  . ADD (attention deficit disorder)   . Anemia   . Esophageal stricture 11/04/2014  . GERD (gastroesophageal reflux disease)   . H/O hiatal hernia   . Hx: UTI (urinary tract infection)   . Interstitial cystitis   . Kidney stone   . Lumbago   . Palpitations 02/2017  . Paraesophageal hernia   . Pneumonia 05-09-11   2011 last time  . Stroke ([place])   . Weight loss   . Wound disruption, post-op, skin   . 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 BACK SURGERY  05-09-11   x 2 lumbar  . CHOLECYSTECTOMY  05-09-11   laparoscopic  . COLONOSCOPY  2002, 2008    rectal hyperplastic polyp 2002, diverticulosis 2008  . ESOPHAGOGASTRODUODENOSCOPY    . HERNIA  REPAIR  05/16/2011   hiatal hernia  . HIATAL HERNIA REPAIR  02/09/2012   Procedure: LAPAROSCOPIC REPAIR OF HIATAL HERNIA;  Surgeon: Pedro Earls, MD;  Location: [facility];  Service: General;  Laterality: N/A;  . INSERTION OF MESH  02/09/2012   Procedure: INSERTION OF MESH;  Surgeon: Pedro Earls, MD;  Location: [facility];  Service: General;;  . IRRIGATION AND DEBRIDEMENT SEBACEOUS CYST  05/16/2011   Procedure: IRRIGATION AND DEBRIDEMENT SEBACEOUS CYST;  Surgeon: Pedro Earls, MD;  Location: [facility];  Service: General;  Laterality: N/A;  middle of chest on sternum  . LAPAROSCOPIC NISSEN FUNDOPLICATION  04/19/8655   Procedure: LAPAROSCOPIC NISSEN FUNDOPLICATION;  Surgeon: Pedro Earls, MD;  Location: [facility];  Service: General;  Laterality: N/A;  will also remove sebaceous cyst of chest wall  . LAPAROSCOPIC NISSEN FUNDOPLICATION  84/[id number]/6295   Procedure: LAPAROSCOPIC NISSEN FUNDOPLICATION;  Surgeon: Pedro Earls, MD;  [facility];  Service: General;  Laterality: N/A;  Re-do Laparoscopic Nissen   . SEPTOPLASTY    . TONSILLECTOMY  05-09-11   Tonsillectomy  . UMBILICAL HERNIA REPAIR  05/16/2011   Procedure: HERNIA REPAIR UMBILICAL ADULT;  Surgeon: Pedro Earls, MD;  [facility]: [facility];  Service: General;  Laterality: N/A;  with mesh   . VAGINAL HYSTERECTOMY  Current Medications: Current Meds  Medication Sig  . aspirin EC 81 MG tablet Take 81 mg by mouth daily.  . Cholecalciferol (VITAMIN D3) 5000 UNITS CAPS Take 5,000 Units by mouth daily.  . Cranberry-Vitamin C 84-20 MG CAPS Take 2 capsules by mouth daily.  . Cyanocobalamin (VITAMIN B-12 PO) Take 1 tablet by mouth daily.  Marland Kitchen esomeprazole (NEXIUM) 20 MG capsule Take 20 mg by mouth daily.  Marland Kitchen ibuprofen (ADVIL,MOTRIN) 200 MG tablet Take 200 mg by mouth every 6 (six) hours as needed for headache or mild pain.   . Multiple Vitamins-Minerals (MULTIVITAMIN WOMEN 50+) TABS Take 1 tablet by mouth daily.  . ondansetron (ZOFRAN ODT) 4  MG disintegrating tablet Dissolve 1 tablet on the tongue every 6 hours as needed for nausea.  . promethazine (PHENERGAN) 25 MG suppository Place 1 suppository (25 mg total) rectally every 6 (six) hours as needed for nausea.  . promethazine (PHENERGAN) 25 MG tablet Take 0.5 tablets (12.5 mg total) by mouth every 8 (eight) hours as needed for nausea or vomiting.     Allergies:   Benzocaine; Epinephrine; Omeprazole; and Sulfa antibiotics   Social History   Socioeconomic History  . Marital status: Married    Spouse name: [name]  . Number of children: 3  . Years of education: College  . Highest education level: Not on file  Occupational History  . Not on file  Social Needs  . Financial resource strain: Not on file  . Food insecurity:    Worry: Not on file    Inability: Not on file  . Transportation needs:    Medical: Not on file    Non-medical: Not on file  Tobacco Use  . Smoking status: Never Smoker  . Smokeless tobacco: Never Used  Substance and Sexual Activity  . Alcohol use: Not Currently    Alcohol/week: 1.0 standard drinks    Types: 1 Glasses of wine per week    Comment: Daily 4oz  . Drug use: No  . Sexual activity: Yes  Lifestyle  . Physical activity:    Days per week: Not on file    Minutes per session: Not on file  . Stress: Not on file  Relationships  . Social connections:    Talks on phone: Not on file    Gets together: Not on file    Attends religious service: Not on file    Active member of club or organization: Not on file    Attends meetings of clubs or organizations: Not on file    Relationship status: Not on file  Other Topics Concern  . Not on file  Social History Narrative   Lives with husband.   Caffeine use: occasional coke   Right handed   Enjoys tennis   3 children   Daily wine no tobacco or drug use     Family History: The patient's family history includes Alcohol abuse in her father; Alzheimer's disease in her mother; Bipolar disorder in  her mother; Diabetes in her mother; Stroke in her mother; Ulcers in her father and paternal grandmother. There is no history of Colon cancer.  ROS:   Please see the history of present illness.    Significant abdominal discomfort and pain in the interval since I last saw her leading to GI work-up.  Chest discomfort and burning has been resolved to esophageal reflux with associated esophageal chest pain.  All other systems reviewed and are negative.  EKGs/Labs/Other Studies Reviewed:    The following studies were  reviewed today:  Coronary CT with morphology 04/04/2018: IMPRESSION: 1. The patient's coronary artery calcium score is 86, which places the patient in the 51st percentile. This is average for what is expected compared to age and sex matched peers.  2. Normal coronary origin with right dominance.  3. Moderate CAD in the proximal LAD, CADRADS = 3. CT FFR analysis will be performed.  Extra-cardiac CT report: IMPRESSION: Small hiatal hernia.  No acute extra cardiac abnormality.  CT FFR 04/08/2018: 1. Left Main:  No significant stenosis. FFR = 0.97  2. LAD: No significant stenosis. Proximal FFR = 0.96, Mid FFR = 0.90, Distal FFR = 0.87 3. LCX: No significant stenosis. Proximal FFR = 0.95, Distal FFR = not mapped 4. RCA: No significant stenosis. Proximal FFR = 0.97, Mid FFR = 0.93, Distal FFR = 0.89  IMPRESSION: 1.  CT FFR analysis didn't show any significant stenosis.  CT Abdomen and pelvis April 05, 2018: IMPRESSION: 1. Stable appearance of small hiatal hernia, with postoperative findings along the adjacent intra-abdominal segment of the stomach. 2. Filling defect in the cecum at the base of the appendix, probably from bowel contents/stool, less likely to be a cecal mass. Correlation with the patient's colon cancer screening history is recommended. If screening is not up-to-date, appropriate screening should be considered. 3. Scattered small bowel diverticula,  but these do not appear inflamed. 4. Other imaging findings of potential clinical significance: Right posterior hemidiaphragmatic hernia contains adipose tissue. Aortic Atherosclerosis (ICD10-I70.0). Vertebral hemangiomas. 7 mm of degenerative anterolisthesis at L4-5.     EKG:  EKG repeated  Recent Labs: 03/31/2018: ALT 16; BUN 12; Creatinine, Ser 1.11; Hemoglobin 14.2; Platelets 163; Potassium 3.8; Sodium 137  Recent Lipid Panel    Component Value Date/Time   CHOL 199 03/05/2017 0452   TRIG 117 03/05/2017 0452   HDL 49 03/05/2017 0452   CHOLHDL 4.1 03/05/2017 0452   VLDL 23 03/05/2017 0452   LDLCALC 127 (H) 03/05/2017 0452    Physical Exam:    VS:  BP (!) 82/52   Pulse 91   Ht 5\' 5"  (1.651 m)   Wt 151 lb 6.4 oz (68.7 kg)   SpO2 97%   BMI 25.19 kg/m     Wt Readings from Last 3 Encounters:  05/29/18 151 lb 6.4 oz (68.7 kg)  03/31/18 169 lb (76.7 kg)  03/26/18 173 lb (78.5 kg)     GEN: Initially pale appearing returning to normal complexion upon lying. No acute distress HEENT: Normal NECK: No JVD. LYMPHATICS: No lymphadenopathy CARDIAC: RRR.  No murmur, no gallop, no edema VASCULAR: 2+ bilateral radial and carotid pulses, no bruits RESPIRATORY:  Clear to auscultation without rales, wheezing or rhonchi  ABDOMEN: Soft, non-tender, non-distended, No pulsatile mass, MUSCULOSKELETAL: No deformity  SKIN: Warm and dry NEUROLOGIC:  Alert and oriented x 3 PSYCHIATRIC:  Normal affect   ASSESSMENT:    1. Hypotension due to hypovolemia   2. CAD in native artery   3. Atypical chest pain   4. Essential hypertension   5. Acute ischemic vertebrobasilar artery thalamic stroke involving left-sided vessel (HCC)    PLAN:    In order of problems listed above:  1. 22 pound weight loss over the past 2 months.  Has been reluctant to eat or drink with vigor due to significant reflux and concerned that she may get bloated, vomiting, and damage her hiatal hernia repair.  She is  orthostatic today with sitting blood pressure 82/52 mmHg.  Supine blood pressure 120/62  mmHg. 2. Stable without anginal complaints.  Nonobstructive coronary disease documented by CT.  We discussed secondary prevention.  She feels that medications will not work because she has significant reactions to most medications with significant intolerances.  We did discuss secondary prevention including the importance of moderate activity. 3. Chest pain secondary to esophageal reflux 4. Blood pressure is soft today for reasons outlined above. 5. Not addressed  She is instructed to go to the emergency room if persistent low blood pressure which she will monitor at home.  She is encouraged to load with fluid, such as Gatorade.  If blood pressure remains persistently low or if she feels faint, she will need to go to the emergency room.  Emergency room referral was initially recommended but she strongly desired to go home and try to push fluids.  If she continues to have relatively low blood pressures over the next 12 to 24 hours she should go to the emergency room or the very least contact her primary care physician.  PRN cardiology follow-up.   Medication Adjustments/Labs and Tests Ordered: Current medicines are reviewed at length with the patient today.  Concerns regarding medicines are outlined above.  No orders of the defined types were placed in this encounter.  No orders of the defined types were placed in this encounter.   There are no Patient Instructions on file for this visit.   Signed, Sinclair Grooms, MD  05/29/2018 2:00 PM    [place]

## 2018-05-29 NOTE — Patient Instructions (Signed)
Medication Instructions:  Your physician recommends that you continue on your current medications as directed. Please refer to the Current Medication list given to you today.  If you need a refill on your cardiac medications before your next appointment, please call your pharmacy.   Lab work: None If you have labs (blood work) drawn today and your tests are completely normal, you will receive your results only by: . MyChart Message (if you have MyChart) OR . A paper copy in the mail If you have any lab test that is abnormal or we need to change your treatment, we will call you to review the results.  Testing/Procedures: None  Follow-Up: Your physician recommends that you schedule a follow-up appointment as needed with Dr. Smith.   Any Other Special Instructions Will Be Listed Below (If Applicable).    

## 2018-07-16 DIAGNOSIS — I10 Essential (primary) hypertension: Secondary | ICD-10-CM | POA: Diagnosis not present

## 2018-07-23 DIAGNOSIS — E785 Hyperlipidemia, unspecified: Secondary | ICD-10-CM | POA: Diagnosis not present

## 2018-07-23 DIAGNOSIS — F419 Anxiety disorder, unspecified: Secondary | ICD-10-CM | POA: Diagnosis not present

## 2018-07-23 DIAGNOSIS — I10 Essential (primary) hypertension: Secondary | ICD-10-CM | POA: Diagnosis not present

## 2018-07-23 DIAGNOSIS — K219 Gastro-esophageal reflux disease without esophagitis: Secondary | ICD-10-CM | POA: Diagnosis not present

## 2018-08-22 DIAGNOSIS — R300 Dysuria: Secondary | ICD-10-CM | POA: Diagnosis not present

## 2018-08-26 NOTE — Telephone Encounter (Signed)
done

## 2018-10-08 DIAGNOSIS — K219 Gastro-esophageal reflux disease without esophagitis: Secondary | ICD-10-CM | POA: Diagnosis not present

## 2018-10-08 DIAGNOSIS — R1013 Epigastric pain: Secondary | ICD-10-CM | POA: Diagnosis not present

## 2018-10-08 DIAGNOSIS — R110 Nausea: Secondary | ICD-10-CM | POA: Diagnosis not present

## 2018-10-08 DIAGNOSIS — Z8719 Personal history of other diseases of the digestive system: Secondary | ICD-10-CM | POA: Diagnosis not present

## 2018-11-13 DIAGNOSIS — R300 Dysuria: Secondary | ICD-10-CM | POA: Diagnosis not present

## 2018-11-27 ENCOUNTER — Ambulatory Visit (INDEPENDENT_AMBULATORY_CARE_PROVIDER_SITE_OTHER): Payer: PPO | Admitting: Urology

## 2018-11-27 ENCOUNTER — Encounter: Payer: Self-pay | Admitting: Urology

## 2018-11-27 ENCOUNTER — Other Ambulatory Visit: Payer: Self-pay

## 2018-11-27 VITALS — BP 120/80 | HR 80 | Ht 64.0 in | Wt 171.0 lb

## 2018-11-27 DIAGNOSIS — N390 Urinary tract infection, site not specified: Secondary | ICD-10-CM

## 2018-11-28 ENCOUNTER — Telehealth: Payer: Self-pay | Admitting: Urology

## 2018-11-28 LAB — URINALYSIS, COMPLETE
Bilirubin, UA: NEGATIVE
Glucose, UA: NEGATIVE
Ketones, UA: NEGATIVE
Nitrite, UA: NEGATIVE
Protein,UA: NEGATIVE
RBC, UA: NEGATIVE
Specific Gravity, UA: 1.02 (ref 1.005–1.030)
Urobilinogen, Ur: 2 mg/dL — ABNORMAL HIGH (ref 0.2–1.0)
pH, UA: 6.5 (ref 5.0–7.5)

## 2018-11-28 LAB — MICROSCOPIC EXAMINATION: RBC, Urine: NONE SEEN /hpf (ref 0–2)

## 2018-11-28 NOTE — Telephone Encounter (Signed)
Stoioff saw pt yesterday and she has a few questions about visit.  Can you give her a call when you get a chance please?

## 2018-11-28 NOTE — Telephone Encounter (Signed)
Patient called back specifically asking about a medication that she and Dr. Bernardo Heater spoke about.  I attempted to resolve the inquiry, but Dr. Dene Gentry note is not complete and no prescriptions were sent to her pharmacy.    Patient can be reached at [phone].  Her pharmacy is [facility] .

## 2018-11-30 ENCOUNTER — Encounter: Payer: Self-pay | Admitting: Urology

## 2018-11-30 ENCOUNTER — Other Ambulatory Visit: Payer: Self-pay | Admitting: Urology

## 2018-11-30 MED ORDER — CEPHALEXIN 500 MG PO CAPS: ORAL_CAPSULE | ORAL | 3 refills | Status: DC

## 2018-11-30 NOTE — Progress Notes (Signed)
11/27/2018 9:20 AM   Stephanie Bell 27-Aug-1940 [id number]  Referring provider: Maryland Pink, MD [address] [facility] [place],  [place] [address]  Chief Complaint  Patient presents with  . Urinary Tract Infection    HPI: Stephanie Bell is a 78 y.o. female who presents for evaluation of recurrent UTIs.  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  Refer to her written narrative which has been scanned into media.  She has been on demand therapy with Macrobid 100 mg dating back to the 1980s.  She has had several cystoscopies which have been unremarkable.  A CT of the abdomen pelvis in January 2020 showed no upper tract abnormalities.  She estimates frequency of her UTI episodes at 3-4 times per year.  She was treated for a Klebsiella UTI by her PCP in late August 2020.  Denies gross hematuria.  Denies flank, abdominal or pelvic pain.  She has mild to moderate incontinence and wears a pad.  It was difficult to characterize her incontinence but it sounds more in line with unaware incontinence.   PMH: Past Medical History:  Diagnosis Date  . ADD (attention deficit disorder)   . Anemia   . Esophageal stricture 11/04/2014  . GERD (gastroesophageal reflux disease)   . H/O hiatal hernia   . Hx: UTI (urinary tract infection)   . Interstitial cystitis   . Kidney stone   . Lumbago   . Palpitations 02/2017  . Paraesophageal hernia   . Pneumonia 05-09-11   2011 last time  . Stroke ([place])   . Weight loss   . Wound disruption, post-op, skin   . 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 BACK SURGERY  05-09-11   x 2 lumbar  . CHOLECYSTECTOMY  05-09-11   laparoscopic  . COLONOSCOPY  2002, 2008    rectal hyperplastic polyp 2002, diverticulosis 2008  . ESOPHAGOGASTRODUODENOSCOPY    . HERNIA REPAIR  05/16/2011   hiatal hernia  . HIATAL HERNIA REPAIR  02/09/2012   Procedure:  LAPAROSCOPIC REPAIR OF HIATAL HERNIA;  Surgeon: Pedro Earls, MD;  Location: [facility];  Service: General;  Laterality: N/A;  . INSERTION OF MESH  02/09/2012   Procedure: INSERTION OF MESH;  Surgeon: Pedro Earls, MD;  Location: [facility];  Service: General;;  . IRRIGATION AND DEBRIDEMENT SEBACEOUS CYST  05/16/2011   Procedure: IRRIGATION AND DEBRIDEMENT SEBACEOUS CYST;  Surgeon: Pedro Earls, MD;  Location: [facility];  Service: General;  Laterality: N/A;  middle of chest on sternum  . LAPAROSCOPIC NISSEN FUNDOPLICATION  0000000   Procedure: LAPAROSCOPIC NISSEN FUNDOPLICATION;  Surgeon: Pedro Earls, MD;  [facility];  Service: General;  Laterality: N/A;  will also remove sebaceous cyst of chest wall  . LAPAROSCOPIC NISSEN FUNDOPLICATION  [id number]   Procedure: LAPAROSCOPIC NISSEN FUNDOPLICATION;  Surgeon: Pedro Earls, MD;  [facility];  Service: General;  Laterality: N/A;  Re-do Laparoscopic Nissen   . SEPTOPLASTY    . TONSILLECTOMY  05-09-11   Tonsillectomy  . UMBILICAL HERNIA REPAIR  05/16/2011   Procedure: HERNIA REPAIR UMBILICAL ADULT;  Surgeon: Pedro Earls, MD;  [facility]: [facility];  Service: General;  Laterality: N/A;  with mesh   . VAGINAL HYSTERECTOMY      Home Medications:  Allergies as of 11/27/2018      Reactions   Benzocaine Palpitations   Specific agent was  Scandonest 2 %   Epinephrine Palpitations, Other (See Comments)   Reaction:  Tachycardia    Omeprazole Diarrhea   Sulfa Antibiotics Swelling, Rash      Medication List       Accurate as of November 27, 2018 11:59 PM. If you have any questions, ask your nurse or doctor.        STOP taking these medications   esomeprazole 20 MG capsule Commonly known as: Chloride by: Abbie Sons, MD     TAKE these medications   aspirin EC 81 MG tablet Take 81 mg by mouth daily.   cefUROXime 500 MG tablet Commonly known as: CEFTIN   Cranberry-Vitamin C 84-20 MG Caps Take 2 capsules by  mouth daily.   ibuprofen 200 MG tablet Commonly known as: ADVIL Take 200 mg by mouth every 6 (six) hours as needed for headache or mild pain.   Multivitamin Women 50+ Tabs Take 1 tablet by mouth daily.   ondansetron 4 MG disintegrating tablet Commonly known as: Zofran ODT Dissolve 1 tablet on the tongue every 6 hours as needed for nausea.   promethazine 25 MG suppository Commonly known as: Phenergan Place 1 suppository (25 mg total) rectally every 6 (six) hours as needed for nausea.   promethazine 25 MG tablet Commonly known as: PHENERGAN Take 0.5 tablets (12.5 mg total) by mouth every 8 (eight) hours as needed for nausea or vomiting.   VITAMIN B-12 PO Take 1 tablet by mouth daily.   Vitamin D3 125 MCG (5000 UT) Caps Take 5,000 Units by mouth daily.       Allergies:  Allergies  Allergen Reactions  . Benzocaine Palpitations    Specific agent was Scandonest 2 %  . Epinephrine Palpitations and Other (See Comments)    Reaction:  Tachycardia   . Omeprazole Diarrhea  . Sulfa Antibiotics Swelling and Rash    Family History: Family History  Problem Relation Age of Onset  . Ulcers Father   . Alcohol abuse Father   . Alzheimer's disease Mother   . Stroke Mother   . Diabetes Mother   . Bipolar disorder Mother   . Ulcers Paternal Grandmother   . Colon cancer Neg Hx     Social History:  reports that she has never smoked. She has never used smokeless tobacco. She reports previous alcohol use of about 1.0 standard drinks of alcohol per week. She reports that she does not use drugs.  ROS: UROLOGY Frequent Urination?: No Hard to postpone urination?: No Burning/pain with urination?: No Get up at night to urinate?: No Leakage of urine?: No Urine stream starts and stops?: No Trouble starting stream?: No Do you have to strain to urinate?: No Blood in urine?: No Urinary tract infection?: Yes Sexually transmitted disease?: No Injury to kidneys or bladder?: No Painful  intercourse?: No Weak stream?: No Currently pregnant?: No Vaginal bleeding?: No Last menstrual period?: N/A  Gastrointestinal Nausea?: Yes Vomiting?: No Indigestion/heartburn?: Yes Diarrhea?: No Constipation?: Yes  Constitutional Fever: No Night sweats?: No Weight loss?: No Fatigue?: No  Skin Skin rash/lesions?: No Itching?: No  Eyes Blurred vision?: No Double vision?: No  Ears/Nose/Throat Sore throat?: No Sinus problems?: No  Hematologic/Lymphatic Swollen glands?: No Easy bruising?: No  Cardiovascular Leg swelling?: No Chest pain?: No  Respiratory Cough?: No Shortness of breath?: No  Endocrine Excessive thirst?: No  Musculoskeletal Back pain?: No Joint pain?: No  Neurological Headaches?: No Dizziness?: No  Psychologic Depression?: No Anxiety?: Yes  Physical Exam: BP 120/80 (  BP Location: Left Arm, Patient Position: Sitting, Cuff Size: Normal)   Pulse 80   Ht 5\' 4"  (1.626 m)   Wt 171 lb (77.6 kg)   BMI 29.35 kg/m   Constitutional:  Alert and oriented, No acute distress. HEENT: [place] AT, moist mucus membranes.  Trachea midline, no masses. Cardiovascular: No clubbing, cyanosis, or edema. Respiratory: Normal respiratory effort, no increased work of breathing. GI: Abdomen is soft, nontender, nondistended, no abdominal masses Neurologic: Grossly intact, no focal deficits, moving all 4 extremities. Psychiatric: Normal mood and affect.  Laboratory Data:  Urinalysis Dipstick/microscopy negative  Assessment & Plan:    1. Recurrent UTI 78 y.o. 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  She has most recently been on demand therapy.  Due to her age and nitrofurantoin warnings would not recommend this as demand therapy.  Rx cephalexin was sent to her pharmacy.  If she has to use more than 4 times per year would not recommend continuing demand therapy.  Return in about 1 year (around 11/27/2019) for Recheck.  Abbie Sons, [place] [address] [place], [place] [address] [phone]

## 2018-12-03 NOTE — Telephone Encounter (Signed)
She had a CT of the abdomen and pelvis with contrast January 2020 which looked good.  She has had several previous cystoscopies and unless she has gross hematuria or unexplained microhematuria would not recommend repeating cystoscopy.  Her urine at her recent office visit was clear.

## 2018-12-03 NOTE — Telephone Encounter (Signed)
Called pt informed her that RX has been sent in, pt gave verbal understanding and states that she has picked up rx. Pt questions if she needs MRI of the kidneys or cysto of the bladder, she thought she heard you say you would order these. Please advise. Thanks.

## 2018-12-05 NOTE — Telephone Encounter (Signed)
Called pt informed her of the information below. Pt gave verbal understanding.  

## 2018-12-24 ENCOUNTER — Telehealth: Payer: Self-pay | Admitting: Urology

## 2018-12-24 MED ORDER — DOXYCYCLINE HYCLATE 100 MG PO CAPS: ORAL_CAPSULE | ORAL | 2 refills | Status: DC

## 2018-12-24 NOTE — Telephone Encounter (Signed)
Okay to give a trial of doxycycline however if this is not effective we will need to check urine culture while she is symptomatic.  Rx was sent

## 2018-12-24 NOTE — Telephone Encounter (Signed)
Called pt informed her of the information below. Pt gave verbal understanding.  

## 2018-12-24 NOTE — Telephone Encounter (Signed)
Please advise. Thanks.  

## 2018-12-24 NOTE — Telephone Encounter (Signed)
Pt says abx (keflex 500 mg) Stoioff prescribed hasn't worked.  It didn't touch it.  She states she would like to try Doxycycline, if possible.

## 2018-12-31 DIAGNOSIS — R5381 Other malaise: Secondary | ICD-10-CM | POA: Diagnosis not present

## 2018-12-31 DIAGNOSIS — R5383 Other fatigue: Secondary | ICD-10-CM | POA: Diagnosis not present

## 2018-12-31 DIAGNOSIS — Z0000 Encounter for general adult medical examination without abnormal findings: Secondary | ICD-10-CM | POA: Diagnosis not present

## 2018-12-31 DIAGNOSIS — K219 Gastro-esophageal reflux disease without esophagitis: Secondary | ICD-10-CM | POA: Diagnosis not present

## 2018-12-31 DIAGNOSIS — E785 Hyperlipidemia, unspecified: Secondary | ICD-10-CM | POA: Diagnosis not present

## 2018-12-31 DIAGNOSIS — F419 Anxiety disorder, unspecified: Secondary | ICD-10-CM | POA: Diagnosis not present

## 2018-12-31 DIAGNOSIS — R251 Tremor, unspecified: Secondary | ICD-10-CM | POA: Diagnosis not present

## 2018-12-31 DIAGNOSIS — I679 Cerebrovascular disease, unspecified: Secondary | ICD-10-CM | POA: Diagnosis not present

## 2019-01-08 ENCOUNTER — Telehealth: Payer: Self-pay | Admitting: Internal Medicine

## 2019-01-08 NOTE — Telephone Encounter (Signed)
Patient reports that last week she had a episode of vomiting after taking a magnesium tablet.  She had NISSEN in 2013 and is worried that she may have unwrapped her surgery.  She is advised to remain on a soft diet and will come in and see Ellouise Newer, PA this Friday at 2:00.  She is worried about eating. And has some chest pain.  She will call back if her symptoms worsen between now and Friday/

## 2019-01-10 ENCOUNTER — Ambulatory Visit (INDEPENDENT_AMBULATORY_CARE_PROVIDER_SITE_OTHER): Payer: PPO | Admitting: Physician Assistant

## 2019-01-10 ENCOUNTER — Encounter: Payer: Self-pay | Admitting: Physician Assistant

## 2019-01-10 ENCOUNTER — Other Ambulatory Visit: Payer: Self-pay

## 2019-01-10 VITALS — BP 100/66 | HR 84 | Temp 97.8°F | Ht 64.75 in | Wt 159.4 lb

## 2019-01-10 DIAGNOSIS — R1314 Dysphagia, pharyngoesophageal phase: Secondary | ICD-10-CM | POA: Diagnosis not present

## 2019-01-10 DIAGNOSIS — R12 Heartburn: Secondary | ICD-10-CM

## 2019-01-10 NOTE — Progress Notes (Signed)
Chief Complaint: Dysphagia  HPI:    Stephanie Bell is a 78 year old Caucasian female with a past medical history as listed below including chronic GERD, prior esophageal stricture and Zenker's diverticulum status post Niesen fundoplication in 0000000 for a paraesophageal hernia with a redo 5 months later with mesh after she "ruptured" her repair, known to Dr. Carlean Purl, who presents to clinic today with a complaint of dysphagia and heartburn.    03/26/2018 patient saw Amy for complaint of epigastric discomfort, running in her chest and nausea.  Time was reluctant to take a PPI and said she did not do well with most medications.  She is started on Pepcid AC 2 p.o. twice daily.   Patient was also started on Zoloft at that time for anxiety.    04/05/2018 CT abdomen pelvis with contrast showed stable appearance of small hiatal hernia with postoperative findings along the adjacent intra-abdominal segment of the stomach, filling defect in the cecum at the base of the appendix, probably from bowel contents/stool, scattered small bowel diverticuli.    04/23/2018 esophagram with a moderate paraesophageal hernia with diffuse reflux, short segment area of focal narrowing immediately proximal to the paraesophageal hernia with intermittent spasm, 13 mm barium tablet passed through this area after slightly dissolving after approximately 5-minute time span, moderate esophageal dysmotility with multiple intermittent tertiary contractions.  At that time is recommended Dr. Carlean Purl review but likely she would need an EGD with dilation.  It is recommended she switch to PPI if there is when she is tolerant to in the past.  Patient then never followed up.    Today, the patient presents to clinic and explains that on 10/15 she took a small magnesium tablet and this seemed to get stuck in her throat, she immediately ate afterwards and the food came right back up.  She vomited/regurgitated 5 times until everything was out of her.  She  continued with the hurting in her throat but this gradually went away after she was sipping water and seemed to clear.  Since then the patient has been on a mostly soft diet in order to avoid any problems.  She is very anxious that this will happen again.  Tells me she no longer takes the Pepcid because this gave her diarrhea, she did not tolerate any PPIs in the past other than Nexium and "that didn't really help" and now just will chews a Di-Gel (tells me it is like Tums), which does help with the heartburn that she has, worse in the morning.    Very anxious that something is wrong with her surgery (the wrap).    Denies fever, chills, weight loss, anorexia, nausea or symptoms that awaken her from sleep.  Past Medical History:  Diagnosis Date  . ADD (attention deficit disorder)   . Anemia   . Esophageal stricture 11/04/2014  . GERD (gastroesophageal reflux disease)   . H/O hiatal hernia   . Hx: UTI (urinary tract infection)   . Interstitial cystitis   . Kidney stone   . Lumbago   . Palpitations 02/2017  . Paraesophageal hernia   . Pneumonia 05-09-11   2011 last time  . Stroke ([place])   . Weight loss   . Wound disruption, post-op, skin   . 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 BACK SURGERY  05-09-11   x 2 lumbar  . CHOLECYSTECTOMY  05-09-11   laparoscopic  . COLONOSCOPY  2002, 2008  rectal hyperplastic polyp 2002, diverticulosis 2008  . ESOPHAGOGASTRODUODENOSCOPY    . HERNIA REPAIR  05/16/2011   hiatal hernia  . HIATAL HERNIA REPAIR  02/09/2012   Procedure: LAPAROSCOPIC REPAIR OF HIATAL HERNIA;  Surgeon: Pedro Earls, MD;  Location: [facility];  Service: General;  Laterality: N/A;  . INSERTION OF MESH  02/09/2012   Procedure: INSERTION OF MESH;  Surgeon: Pedro Earls, MD;  Location: [facility];  Service: General;;  . IRRIGATION AND DEBRIDEMENT SEBACEOUS CYST  05/16/2011   Procedure: IRRIGATION AND DEBRIDEMENT  SEBACEOUS CYST;  Surgeon: Pedro Earls, MD;  Location: [facility];  Service: General;  Laterality: N/A;  middle of chest on sternum  . LAPAROSCOPIC NISSEN FUNDOPLICATION  0000000   Procedure: LAPAROSCOPIC NISSEN FUNDOPLICATION;  Surgeon: Pedro Earls, MD;  Location: [facility];  Service: General;  Laterality: N/A;  will also remove sebaceous cyst of chest wall  . LAPAROSCOPIC NISSEN FUNDOPLICATION  [id number]   Procedure: LAPAROSCOPIC NISSEN FUNDOPLICATION;  Surgeon: Pedro Earls, MD;  Location: [facility];  Service: General;  Laterality: N/A;  Re-do Laparoscopic Nissen   . SEPTOPLASTY    . TONSILLECTOMY  05-09-11   Tonsillectomy  . UMBILICAL HERNIA REPAIR  05/16/2011   Procedure: HERNIA REPAIR UMBILICAL ADULT;  Surgeon: Pedro Earls, MD;  Location: [facility];  Service: General;  Laterality: N/A;  with mesh   . VAGINAL HYSTERECTOMY      Current Outpatient Medications  Medication Sig Dispense Refill  . aspirin EC 81 MG tablet Take 81 mg by mouth daily.    . Calcium Carbonate Antacid (TUMS PO) Take by mouth as needed.    . Cholecalciferol (VITAMIN D3) 5000 UNITS CAPS Take 5,000 Units by mouth daily.    . Cranberry-Vitamin C 84-20 MG CAPS Take 2 capsules by mouth daily.    . Cyanocobalamin (VITAMIN B-12 PO) Take 1 tablet by mouth daily.    Marland Kitchen ibuprofen (ADVIL,MOTRIN) 200 MG tablet Take 200 mg by mouth every 6 (six) hours as needed for headache or mild pain.     . Multiple Vitamins-Minerals (MULTIVITAMIN WOMEN 50+) TABS Take 1 tablet by mouth daily.    . ondansetron (ZOFRAN ODT) 4 MG disintegrating tablet Dissolve 1 tablet on the tongue every 6 hours as needed for nausea. 60 tablet 6  . promethazine (PHENERGAN) 25 MG suppository Place 1 suppository (25 mg total) rectally every 6 (six) hours as needed for nausea. 12 suppository 0  . promethazine (PHENERGAN) 25 MG tablet Take 0.5 tablets (12.5 mg total) by mouth every 8 (eight) hours as needed for nausea or vomiting. 30 tablet 1  . sertraline  (ZOLOFT) 25 MG tablet Take 25 mg by mouth daily.    Marland Kitchen doxycycline (VIBRAMYCIN) 100 MG capsule 1 capsule twice daily x7 days at onset of UTI symptoms. (Patient not taking: Reported on 01/10/2019) 14 capsule 2   No current facility-administered medications for this visit.     Allergies as of 01/10/2019 - Review Complete 01/10/2019  Allergen Reaction Noted  . Benzocaine Palpitations 05/24/2015  . Epinephrine hcl (nasal) Palpitations and Other (See Comments) 02/21/2015  . Omeprazole Diarrhea 03/26/2018  . Sulfa antibiotics Swelling and Rash 02/22/2011    Family History  Problem Relation Age of Onset  . Ulcers Father   . Alcohol abuse Father   . Alzheimer's disease Mother   . Stroke Mother   . Diabetes Mother   . Bipolar disorder Mother   . Ulcers Paternal Grandmother   . Colon cancer Neg  Hx     Social History   Socioeconomic History  . Marital status: Married    Spouse name: Stephanie Bell  . Number of children: 3  . Years of education: College  . Highest education level: Not on file  Occupational History  . Not on file  Social Needs  . Financial resource strain: Not on file  . Food insecurity    Worry: Not on file    Inability: Not on file  . Transportation needs    Medical: Not on file    Non-medical: Not on file  Tobacco Use  . Smoking status: Never Smoker  . Smokeless tobacco: Never Used  Substance and Sexual Activity  . Alcohol use: Not Currently    Alcohol/week: 1.0 standard drinks    Types: 1 Glasses of wine per week    Comment: Daily 4oz  . Drug use: No  . Sexual activity: Yes    Birth control/protection: Post-menopausal  Lifestyle  . Physical activity    Days per week: Not on file    Minutes per session: Not on file  . Stress: Not on file  Relationships  . Social Herbalist on phone: Not on file    Gets together: Not on file    Attends religious service: Not on file    Active member of club or organization: Not on file    Attends meetings of  clubs or organizations: Not on file    Relationship status: Not on file  . Intimate partner violence    Fear of current or ex partner: Not on file    Emotionally abused: Not on file    Physically abused: Not on file    Forced sexual activity: Not on file  Other Topics Concern  . Not on file  Social History Narrative   Lives with husband.   Caffeine use: occasional coke   Right handed   Enjoys tennis   3 children   Daily wine no tobacco or drug use    Review of Systems:    Constitutional: No weight loss, fever or chills Cardiovascular: No chest pain Respiratory: No SOB  Gastrointestinal: See HPI and otherwise negative   Physical Exam:  Vital signs: BP 100/66 (BP Location: Left Arm, Patient Position: Sitting, Cuff Size: Normal)   Pulse 84   Temp 97.8 F (36.6 C)   Ht 5' 4.75" (1.645 m)   Wt 159 lb 6 oz (72.3 kg)   BMI 26.73 kg/m   Constitutional:   Pleasant elderly Caucasian female appears to be in NAD, Well developed, Well nourished, alert and cooperative Respiratory: Respirations even and unlabored. Lungs clear to auscultation bilaterally.   No wheezes, crackles, or rhonchi.  Cardiovascular: Normal S1, S2. No MRG. Regular rate and rhythm. No peripheral edema, cyanosis or pallor.  Gastrointestinal:  Soft, nondistended, nontender. No rebound or guarding. Normal bowel sounds. No appreciable masses or hepatomegaly. Rectal:  Not performed.  Psychiatric: Anxious Oriented to person, place and time. Demonstrates good judgement and reason without abnormal affect or behaviors.  See HPI for recent labs and imaging.  Assessment: 1.  Dysphagia: Likely due to stricture seen on recent esophagram in February +/- paraesophageal hernia and dysmotility, patient would like repeat imaging prior to scheduling procedure 2.  Heartburn: Worse in the mornings, patient declines PPI or H2 blocker  Plan: 1.  Initially discussed with the patient that she needs an EGD with dilation, especially  given stricture seen proximal to paraesophageal hernia on barium esophagram in  February of this year.  She initially agreed but then I was called back into the room and she told me she had some concerns about proceeding with this because "Dr. Hassell Done is the only one who has been in there since my surgery".  Tried to reassure her that Dr. Carlean Purl had actually done an endoscopy on her in 2016 and that we just did a barium swallow which gives [place] a good reason for her symptoms, but she request that we repeat barium esophagram first. 2.  Repeat barium esophagram with tablet.  Did have a long discussion of the patient that likely she will need an EGD with dilation.  She verbalized understanding. 3.  Discussed placing the patient back on Nexium but she declines because "prescription medications do not agree with me".  Discussed Pepcid but this gave her severe diarrhea.  Patient would like to continue her Di-gels as needed. 4.  Reviewed antireflux diet and lifestyle modifications.  Also reviewed antidysphagia measures. 5.  Patient to follow in clinic per recommendations after esophagram.  Ellouise Newer, PA-C [facility] 01/10/2019, 2:18 PM  Cc: Maryland Pink, MD

## 2019-01-10 NOTE — Patient Instructions (Signed)
You have been scheduled for a Barium Esophogram at [facility] on 01/16/2019 at 1:00pm. Please arrive 15 minutes prior to your appointment for registration. Make certain not to have anything to eat or drink 3 hours prior to your test. If you need to reschedule for any reason, please contact radiology at [phone] to do so. __________________________________________________________________ A barium swallow is an examination that concentrates on views of the esophagus. This tends to be a double contrast exam (barium and two liquids which, when combined, create a gas to distend the wall of the oesophagus) or single contrast (non-ionic iodine based). The study is usually tailored to your symptoms so a good history is essential. Attention is paid during the study to the form, structure and configuration of the esophagus, looking for functional disorders (such as aspiration, dysphagia, achalasia, motility and reflux) EXAMINATION You may be asked to change into a gown, depending on the type of swallow being performed. A radiologist and radiographer will perform the procedure. The radiologist will advise you of the type of contrast selected for your procedure and direct you during the exam. You will be asked to stand, sit or lie in several different positions and to hold a small amount of fluid in your mouth before being asked to swallow while the imaging is performed .In some instances you may be asked to swallow barium coated marshmallows to assess the motility of a solid food bolus. The exam can be recorded as a digital or video fluoroscopy procedure. POST PROCEDURE It will take 1-2 days for the barium to pass through your system. To facilitate this, it is important, unless otherwise directed, to increase your fluids for the next 24-48hrs and to resume your normal diet.  This test typically takes about 30 minutes to  perform. __________________________________________________________________________________   I appreciate the opportunity to care for you. Ellouise Newer, PA-C

## 2019-01-16 ENCOUNTER — Ambulatory Visit: Payer: PPO | Admitting: Physician Assistant

## 2019-01-16 ENCOUNTER — Ambulatory Visit (HOSPITAL_COMMUNITY)
Admission: RE | Admit: 2019-01-16 | Discharge: 2019-01-16 | Disposition: A | Discharge status: Home / Self Care | Payer: PPO | Source: Ambulatory Visit | Attending: Physician Assistant | Admitting: Physician Assistant

## 2019-01-16 ENCOUNTER — Other Ambulatory Visit: Payer: Self-pay

## 2019-01-16 DIAGNOSIS — K449 Diaphragmatic hernia without obstruction or gangrene: Secondary | ICD-10-CM | POA: Diagnosis not present

## 2019-01-16 DIAGNOSIS — K222 Esophageal obstruction: Secondary | ICD-10-CM

## 2019-01-16 DIAGNOSIS — R12 Heartburn: Secondary | ICD-10-CM | POA: Diagnosis not present

## 2019-01-16 DIAGNOSIS — R1314 Dysphagia, pharyngoesophageal phase: Secondary | ICD-10-CM | POA: Insufficient documentation

## 2019-01-17 ENCOUNTER — Telehealth: Payer: Self-pay | Admitting: Internal Medicine

## 2019-01-20 ENCOUNTER — Encounter: Payer: Self-pay | Admitting: Internal Medicine

## 2019-01-21 ENCOUNTER — Other Ambulatory Visit: Payer: Self-pay | Admitting: Internal Medicine

## 2019-01-21 DIAGNOSIS — Z1159 Encounter for screening for other viral diseases: Secondary | ICD-10-CM | POA: Diagnosis not present

## 2019-01-21 LAB — SARS CORONAVIRUS 2 (TAT 6-24 HRS): SARS Coronavirus 2: NEGATIVE

## 2019-01-23 ENCOUNTER — Ambulatory Visit (AMBULATORY_SURGERY_CENTER): Payer: PPO | Admitting: Internal Medicine

## 2019-01-23 ENCOUNTER — Other Ambulatory Visit: Payer: Self-pay

## 2019-01-23 ENCOUNTER — Encounter: Payer: Self-pay | Admitting: Internal Medicine

## 2019-01-23 VITALS — BP 109/73 | HR 69 | Temp 98.7°F | Resp 10 | Ht 64.0 in | Wt 159.0 lb

## 2019-01-23 DIAGNOSIS — K222 Esophageal obstruction: Secondary | ICD-10-CM

## 2019-01-23 DIAGNOSIS — K449 Diaphragmatic hernia without obstruction or gangrene: Secondary | ICD-10-CM | POA: Diagnosis not present

## 2019-01-23 DIAGNOSIS — R1310 Dysphagia, unspecified: Secondary | ICD-10-CM | POA: Diagnosis not present

## 2019-01-23 MED ORDER — SODIUM CHLORIDE 0.9 % IV SOLN: 500.0000 mL | Freq: Once | INTRAVENOUS | Status: DC

## 2019-01-23 NOTE — Progress Notes (Signed)
Called to room to assist during endoscopic procedure.  Patient ID and intended procedure confirmed with present staff. Received instructions for my participation in the procedure from the performing physician.  

## 2019-01-23 NOTE — Patient Instructions (Addendum)
There was a stricture - scar tissue and some narrowing in the esophagus. I dilated that and opened it up. You should swallow better.  May need to dilate again to open it more.  Please call me back in 1-2 weeks or send a My Chart message to let me know how you are doing and we can determine if it needs to be opened more.  I appreciate the opportunity to care for you. Gatha Mayer, MD, FACG   YOU HAD AN ENDOSCOPIC PROCEDURE TODAY AT [facility]:   Refer to the procedure report that was given to you for any specific questions about what was found during the examination.  If the procedure report does not answer your questions, please call your gastroenterologist to clarify.  If you requested that your care partner not be given the details of your procedure findings, then the procedure report has been included in a sealed envelope for you to review at your convenience later.  YOU SHOULD EXPECT: Some feelings of bloating in the abdomen. Passage of more gas than usual.  Walking can help get rid of the air that was put into your GI tract during the procedure and reduce the bloating. If you had a lower endoscopy (such as a colonoscopy or flexible sigmoidoscopy) you may notice spotting of blood in your stool or on the toilet paper. If you underwent a bowel prep for your procedure, you may not have a normal bowel movement for a few days.  Please Note:  You might notice some irritation and congestion in your nose or some drainage.  This is from the oxygen used during your procedure.  There is no need for concern and it should clear up in a day or so.  SYMPTOMS TO REPORT IMMEDIATELY:    Following upper endoscopy (EGD)  Vomiting of blood or coffee ground material  New chest pain or pain under the shoulder blades  Painful or persistently difficult swallowing  New shortness of breath  Fever of 100F or higher  Black, tarry-looking stools  For urgent or emergent issues, a  gastroenterologist can be reached at any hour by calling [phone].   DIET:  Please follow a post-dilation diet (see handout given to you by your recovery nurse). Nothing by mouth until 1:00pm today, then clear liquids only from 1:00pm to 2:00pm, then soft foods from 2:00pm for the rest of the day today. You may proceed to your regular diet tomorrow as tolerated.  Drink plenty of fluids but you should avoid alcoholic beverages for 24 hours.  MEDICATIONS: Continue present medications. Try some over the counter Nexium and use before supper.  FOLLOW UP: Please contact Dr. Carlean Purl in 1 to 2 weeks to let him know how you are doing with your swallowing. It should be better, but you may need another dilation in the future.  ACTIVITY:  You should plan to take it easy for the rest of today and you should NOT DRIVE or use heavy machinery until tomorrow (because of the sedation medicines used during the test).    FOLLOW UP: Our staff will call the number listed on your records 48-72 hours following your procedure to check on you and address any questions or concerns that you may have regarding the information given to you following your procedure. If we do not reach you, we will leave a message.  We will attempt to reach you two times.  During this call, we will ask if you have developed any  symptoms of COVID 19. If you develop any symptoms (ie: fever, flu-like symptoms, shortness of breath, cough etc.) before then, please call [phone].  If you test positive for Covid 19 in the 2 weeks post procedure, please call and report this information to [place].    If any biopsies were taken you will be contacted by phone or by letter within the next 1-3 weeks.  Please call us at [phone] if you have not heard about the biopsies in 3 weeks.   Thank you for allowing [place] to provide for your healthcare needs today.   SIGNATURES/CONFIDENTIALITY: You and/or your care partner have signed paperwork which will be  entered into your electronic medical record.  These signatures attest to the fact that that the information above on your After Visit Summary has been reviewed and is understood.  Full responsibility of the confidentiality of this discharge information lies with you and/or your care-partner.

## 2019-01-23 NOTE — Progress Notes (Signed)
PT taken to PACU. Monitors in place. VSS. Report given to RN. 

## 2019-01-23 NOTE — Op Note (Addendum)
[facility] Patient Name: Stephanie Bell Procedure Date: 01/23/2019 11:32 AM MRN: [id number] Endoscopist: Gatha Mayer , MD Age: 78 Referring MD:  Date of Birth: 04-Mar-1941 Gender: Female Account #: [id number] Procedure:                Upper GI endoscopy Indications:              Dysphagia Medicines:                Propofol per Anesthesia, Monitored Anesthesia Care Procedure:                Pre-Anesthesia Assessment:                           - Prior to the procedure, a History and Physical                            was performed, and patient medications and                            allergies were reviewed. The patient's tolerance of                            previous anesthesia was also reviewed. The risks                            and benefits of the procedure and the sedation                            options and risks were discussed with the patient.                            All questions were answered, and informed consent                            was obtained. Prior Anticoagulants: The patient has                            taken no previous anticoagulant or antiplatelet                            agents. ASA Grade Assessment: III - A patient with                            severe systemic disease. After reviewing the risks                            and benefits, the patient was deemed in                            satisfactory condition to undergo the procedure.                           After obtaining informed consent, the endoscope was  passed under direct vision. Throughout the                            procedure, the patient's blood pressure, pulse, and                            oxygen saturations were monitored continuously. The                            Endoscope was introduced through the mouth, and                            advanced to the second part of duodenum. The upper                            GI endoscopy was  accomplished without difficulty.                            The patient tolerated the procedure well. Scope In: Scope Out: Findings:                 Abnormal motility was noted in the esophagus. The                            cricopharyngeus was abnormal. There is spasticity                            of the esophageal body. Tertiary peristaltic waves                            are noted.                           One benign-appearing, intrinsic severe stenosis was                            found at the gastroesophageal junction. This                            stenosis measured 1.1 cm (inner diameter). The                            stenosis was traversed after dilation. A TTS                            dilator was passed through the scope. Dilation with                            a 13.5-14.5-15.5 mm balloon dilator was performed                            to 15.5 mm. The dilation site was examined and  showed moderate mucosal disruption. Estimated blood                            loss was minimal.                           A small-medium paraesophageal hernia was found.                            More of a pouch to the side                           The exam was otherwise without abnormality.                           Evidence of a Nissen fundoplication was found in                            the cardia. The wrap appeared loose.                           The cardia and gastric fundus were otherwise normal                            on retroflexion. Complications:            No immediate complications. Estimated Blood Loss:     Estimated blood loss was minimal. Impression:               - Abnormal esophageal motility, consistent with                            esophageal spasm.                           - Benign-appearing esophageal stenosis. Dilated. to                            15.5 mm from 11 mm (scope did not pass initially                           -  Small -medium paraesophageal hernia.                           - The examination was otherwise normal.                           - A Nissen fundoplication was found. The wrap                            appears loose.                           no signs of tiny zenker's diverticula suspected on                            ba studies                           -  No specimens collected. Recommendation:           - Patient has a contact number available for                            emergencies. The signs and symptoms of potential                            delayed complications were discussed with the                            patient. Return to normal activities tomorrow.                            Written discharge instructions were provided to the                            patient.                           - Clear liquids x 1 hour then soft foods rest of                            day. Start prior diet tomorrow.                           - Continue present medications.                           - I have asked her to contact me in 1-2 weeks with                            an update on swallowing. it should be better but                            she may need another dilation as only got to 15.5                            mm today.                           she is intolerant of PPi Tx and H2 blocker so will                            not Rx (except for Nexium which she did no think                            helped - will revisit using that as PPi would                            reduce stricture formation I think)                           She has self-modified diet - should continue that  Discussion in recovery to try OTC Neium qac supper                            as she tends to have 0300 reflux attacks Gatha Mayer, MD 01/23/2019 12:06:35 PM This report has been signed electronically.

## 2019-01-23 NOTE — Progress Notes (Signed)
Temperature taken by J.B., VS taken by W.R.

## 2019-01-27 ENCOUNTER — Telehealth: Payer: Self-pay

## 2019-01-27 NOTE — Telephone Encounter (Signed)
Spoke to her She is swallowing better Have decided to have her call and make a f/u visit  No need for any surgical intervention that I can see  - she was wondering if Dr. Hassell Done needed to do something and be involved  He had called her to f/u also

## 2019-01-27 NOTE — Telephone Encounter (Signed)
  Follow up Call-  Call back number 01/23/2019  Post procedure Call Back phone  # [phone]  Permission to leave phone message Yes  Some recent data might be hidden     Patient questions:  Do you have a fever, pain , or abdominal swelling? No. Pain Score  0 *  Have you tolerated food without any problems? Yes.    Have you been able to return to your normal activities? Yes.    Do you have any questions about your discharge instructions: Diet   No. Medications  No. Follow up visit  No.  Do you have questions or concerns about your Care? No.  Actions: * If pain score is 4 or above: No action needed, pain <4.  1. Have you developed a fever since your procedure? No  2.   Have you had an respiratory symptoms (SOB or cough) since your procedure? No 3.   Have you tested positive for COVID 19 since your procedure No  4.   Have you had any family members/close contacts diagnosed with the COVID 19 since your procedure?  No  If yes to any of these questions please route to Joylene John, RN and Alphonsa Gin, RN.

## 2019-01-28 ENCOUNTER — Other Ambulatory Visit: Payer: Self-pay

## 2019-01-28 ENCOUNTER — Ambulatory Visit (INDEPENDENT_AMBULATORY_CARE_PROVIDER_SITE_OTHER): Payer: PPO | Admitting: Physician Assistant

## 2019-01-28 ENCOUNTER — Encounter: Payer: Self-pay | Admitting: Physician Assistant

## 2019-01-28 ENCOUNTER — Telehealth: Payer: Self-pay | Admitting: Urology

## 2019-01-28 VITALS — BP 108/72 | HR 81 | Ht 64.0 in | Wt 159.0 lb

## 2019-01-28 DIAGNOSIS — R110 Nausea: Secondary | ICD-10-CM | POA: Diagnosis not present

## 2019-01-28 DIAGNOSIS — F419 Anxiety disorder, unspecified: Secondary | ICD-10-CM | POA: Diagnosis not present

## 2019-01-28 DIAGNOSIS — B372 Candidiasis of skin and nail: Secondary | ICD-10-CM

## 2019-01-28 DIAGNOSIS — N390 Urinary tract infection, site not specified: Secondary | ICD-10-CM | POA: Diagnosis not present

## 2019-01-28 DIAGNOSIS — R251 Tremor, unspecified: Secondary | ICD-10-CM | POA: Diagnosis not present

## 2019-01-28 MED ORDER — DOXYCYCLINE HYCLATE 100 MG PO CAPS: ORAL_CAPSULE | ORAL | 0 refills | Status: DC

## 2019-01-28 MED ORDER — NYSTATIN-TRIAMCINOLONE 100000-0.1 UNIT/GM-% EX OINT: 1.0000 "application " | TOPICAL_OINTMENT | Freq: Two times a day (BID) | CUTANEOUS | 2 refills | Status: DC

## 2019-01-28 MED ORDER — FLUCONAZOLE 150 MG PO TABS: 150.0000 mg | ORAL_TABLET | Freq: Once | ORAL | 0 refills | Status: DC

## 2019-01-28 NOTE — Telephone Encounter (Signed)
Called pt she c/o severe burning and stinging with urination. Pt states that she has tried the prophylactic antibiotics that were prescribed for her with no relief. Per Stoioff's last note pt to be seen in office if no relief with Doxycycline. Pt added to today's schedule. Pt voiced understanding.

## 2019-01-28 NOTE — Telephone Encounter (Signed)
Pt. Complains of dysuria and frequent urination. She has taken a urine sample at home and would like to drop it off. Pt. States it has been 3 weeks since her last UTI. She has started AZO but would like to know if she can get doxycycline.She would like to know if she needs to make an appointment or just go ahead and get abx. Please advise.(last seen in office 11/27/18)

## 2019-01-28 NOTE — Progress Notes (Signed)
01/28/2019 3:24 PM   Stephanie Bell Jan 30, 1941 [id number]  CC: Dysuria  HPI: MARCIEL Bell is a 78 y.o. female who presents today for evaluation of possible UTI. She is an established [facility] patient who last saw Dr. Bernardo Heater on 11/27/2018 for rUTI.  She reports a 1-day history of dysuria and frequency. She denies fever, chills, nausea, vomiting, and gross hematuria. She has taken Azo at home with adequate symptom palliation, most recently this morning. She has not taken any antibiotics for these symptoms.  In-office UA today unable to read due to color interference; urine microscopy with 11-30 WBCs/HPF and many bacteria.   She is allergic to sulfa antibiotics. She reports being sensitive to many prescription medications with the inability to take large pills.  PMH: Past Medical History:  Diagnosis Date  . ADD (attention deficit disorder)   . Anemia   . Esophageal stricture 11/04/2014  . GERD (gastroesophageal reflux disease)   . H/O hiatal hernia   . Hx: UTI (urinary tract infection)   . Interstitial cystitis   . Kidney stone   . Lumbago   . Palpitations 02/2017  . Paraesophageal hernia   . Pneumonia 05-09-11   2011 last time  . Stroke ([place])   . Weight loss   . Wound disruption, post-op, skin   . 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 BACK SURGERY  05-09-11   x 2 lumbar  . CHOLECYSTECTOMY  05-09-11   laparoscopic  . COLONOSCOPY  2002, 2008    rectal hyperplastic polyp 2002, diverticulosis 2008  . ESOPHAGOGASTRODUODENOSCOPY    . HERNIA REPAIR  05/16/2011   hiatal hernia  . HIATAL HERNIA REPAIR  02/09/2012   Procedure: LAPAROSCOPIC REPAIR OF HIATAL HERNIA;  Surgeon: Pedro Earls, MD;  Location: [facility];  Service: General;  Laterality: N/A;  . INSERTION OF MESH  02/09/2012   Procedure: INSERTION OF MESH;  Surgeon: Pedro Earls, MD;  Location: [facility];  Service: General;;  .  IRRIGATION AND DEBRIDEMENT SEBACEOUS CYST  05/16/2011   Procedure: IRRIGATION AND DEBRIDEMENT SEBACEOUS CYST;  Surgeon: Pedro Earls, MD;  Location: [facility];  Service: General;  Laterality: N/A;  middle of chest on sternum  . LAPAROSCOPIC NISSEN FUNDOPLICATION  0000000   Procedure: LAPAROSCOPIC NISSEN FUNDOPLICATION;  Surgeon: Pedro Earls, MD;  Location: [facility];  Service: General;  Laterality: N/A;  will also remove sebaceous cyst of chest wall  . LAPAROSCOPIC NISSEN FUNDOPLICATION  [id number]   Procedure: LAPAROSCOPIC NISSEN FUNDOPLICATION;  Surgeon: Pedro Earls, MD;  [facility];  Service: General;  Laterality: N/A;  Re-do Laparoscopic Nissen   . SEPTOPLASTY    . TONSILLECTOMY  05-09-11   Tonsillectomy  . UMBILICAL HERNIA REPAIR  05/16/2011   Procedure: HERNIA REPAIR UMBILICAL ADULT;  Surgeon: Pedro Earls, MD;  [facility]: [facility];  Service: General;  Laterality: N/A;  with mesh   . UPPER GASTROINTESTINAL ENDOSCOPY    . VAGINAL HYSTERECTOMY      Home Medications:  Allergies as of 01/28/2019      Reactions   Benzocaine Palpitations   Specific agent was Scandonest 2 %   Epinephrine Hcl (nasal) Palpitations, Other (See Comments)   Reaction:  Tachycardia    Omeprazole Diarrhea   Sulfa Antibiotics Swelling, Rash      Medication List       Accurate as of January 28, 2019  3:24 PM. If you have any  questions, ask your nurse or doctor.        aspirin EC 81 MG tablet Take 81 mg by mouth daily.   Cranberry-Vitamin C 84-20 MG Caps Take 2 capsules by mouth daily.   doxycycline 100 MG capsule Commonly known as: VIBRAMYCIN 1 capsule twice daily x7 days at onset of UTI symptoms.   ibuprofen 200 MG tablet Commonly known as: ADVIL Take 200 mg by mouth every 6 (six) hours as needed for headache or mild pain.   Multivitamin Women 50+ Tabs Take 1 tablet by mouth daily.   ondansetron 4 MG disintegrating tablet Commonly known as: Zofran ODT Dissolve 1 tablet on the  tongue every 6 hours as needed for nausea.   promethazine 25 MG suppository Commonly known as: Phenergan Place 1 suppository (25 mg total) rectally every 6 (six) hours as needed for nausea.   promethazine 25 MG tablet Commonly known as: PHENERGAN Take 0.5 tablets (12.5 mg total) by mouth every 8 (eight) hours as needed for nausea or vomiting.   sertraline 25 MG tablet Commonly known as: ZOLOFT Take 25 mg by mouth daily.   TUMS PO Take by mouth as needed.   VITAMIN B-12 PO Take 1 tablet by mouth daily.   Vitamin D3 125 MCG (5000 UT) Caps Take 5,000 Units by mouth daily.       Allergies:  Allergies  Allergen Reactions  . Benzocaine Palpitations    Specific agent was Scandonest 2 %  . Epinephrine Hcl (Nasal) Palpitations and Other (See Comments)    Reaction:  Tachycardia   . Omeprazole Diarrhea  . Sulfa Antibiotics Swelling and Rash    Family History: Family History  Problem Relation Age of Onset  . Ulcers Father   . Alcohol abuse Father   . Alzheimer's disease Mother   . Stroke Mother   . Diabetes Mother   . Bipolar disorder Mother   . Ulcers Paternal Grandmother   . Colon cancer Neg Hx   . Rectal cancer Neg Hx   . Stomach cancer Neg Hx   . Esophageal cancer Neg Hx     Social History:   reports that she has never smoked. She has never used smokeless tobacco. She reports previous alcohol use of about 1.0 standard drinks of alcohol per week. She reports that she does not use drugs.  ROS: UROLOGY Frequent Urination?: Yes Hard to postpone urination?: No Burning/pain with urination?: Yes Get up at night to urinate?: Yes Leakage of urine?: Yes Urine stream starts and stops?: No Trouble starting stream?: No Do you have to strain to urinate?: No Blood in urine?: No Urinary tract infection?: Yes Sexually transmitted disease?: No Injury to kidneys or bladder?: No Painful intercourse?: No Weak stream?: No Currently pregnant?: No Vaginal bleeding?: No Last  menstrual period?: n  Gastrointestinal Nausea?: Yes Vomiting?: No Indigestion/heartburn?: Yes Diarrhea?: No Constipation?: Yes  Constitutional Fever: No Night sweats?: No Weight loss?: No Fatigue?: No  Skin Skin rash/lesions?: No Itching?: No  Eyes Blurred vision?: No Double vision?: No  Ears/Nose/Throat Sore throat?: No Sinus problems?: No  Hematologic/Lymphatic Swollen glands?: No Easy bruising?: No  Cardiovascular Leg swelling?: No Chest pain?: No  Respiratory Cough?: No Shortness of breath?: No  Endocrine Excessive thirst?: No  Musculoskeletal Back pain?: No Joint pain?: No  Neurological Headaches?: No Dizziness?: No  Psychologic Depression?: No Anxiety?: No  Physical Exam: BP 108/72   Pulse 81   Ht 5\' 4"  (1.626 m)   Wt 159 lb (72.1 kg)  BMI 27.29 kg/m   Constitutional:  Alert and oriented, no acute distress, nontoxic appearing HEENT: [place], AT Cardiovascular: No clubbing, cyanosis, or edema Respiratory: Normal respiratory effort, no increased work of breathing GU: Erythema of the inguinal folds with satellite lesions and moisture evident Skin: No rashes, bruises or suspicious lesions Neurologic: Grossly intact, no focal deficits, moving all 4 extremities, resting tremor of the hands L>R Psychiatric: Normal mood and affect  Laboratory Data: Results for orders placed or performed in visit on 01/28/19  Microscopic Examination   URINE  Result Value Ref Range   WBC, UA 11-30 (A) 0 - 5 /hpf   RBC None seen 0 - 2 /hpf   Epithelial Cells (non renal) 0-10 0 - 10 /hpf   Bacteria, UA Many (A) None seen/Few  Urinalysis, Complete  Result Value Ref Range   Specific Gravity, UA CANCELED    pH, UA CANCELED    Color, UA Red (A) Yellow   Appearance Ur Cloudy (A) Clear   Protein,UA CANCELED    Glucose, UA CANCELED    Ketones, UA CANCELED    Microscopic Examination See below:    Assessment & Plan:   1. Recurrent UTI 78 year old female with a  history of recurrent UTI, here with acute onset dysuria and frequency.  UA with pyuria and bacteriuria.  Will send for culture and treat empirically today.  Patient prefers doxycycline.  I am amenable to this.  Prescription sent to her pharmacy.  I counseled her that I contact her if I need to change her antibiotics per culture results.  She expressed understanding. - Urinalysis, Complete - CULTURE, URINE COMPREHENSIVE - doxycycline (VIBRAMYCIN) 100 MG capsule; 1 capsule twice daily x7 days at onset of UTI symptoms.  Dispense: 14 capsule; Refill: 0  2. Candidiasis, intertriginous Patient with intertriginous candidiasis on physical exam today.  Prescribing Mycolog ointment for topical therapy. - nystatin-triamcinolone ointment (MYCOLOG); Apply 1 application topically 2 (two) times daily.  Dispense: 30 g; Refill: 2   Return if symptoms worsen or fail to improve.  Debroah Loop, PA-C  [facility] [address] [place], [place] [address] [phone]

## 2019-01-29 LAB — URINALYSIS, COMPLETE

## 2019-01-29 LAB — MICROSCOPIC EXAMINATION: RBC, Urine: NONE SEEN /hpf (ref 0–2)

## 2019-01-31 LAB — CULTURE, URINE COMPREHENSIVE

## 2019-02-10 ENCOUNTER — Emergency Department
Admission: EM | Admit: 2019-02-10 | Discharge: 2019-02-10 | Disposition: A | Discharge status: Home / Self Care | Payer: PPO | Attending: Emergency Medicine | Admitting: Emergency Medicine

## 2019-02-10 ENCOUNTER — Encounter: Payer: Self-pay | Admitting: *Deleted

## 2019-02-10 ENCOUNTER — Other Ambulatory Visit: Payer: Self-pay

## 2019-02-10 DIAGNOSIS — R0789 Other chest pain: Secondary | ICD-10-CM | POA: Insufficient documentation

## 2019-02-10 DIAGNOSIS — Z7982 Long term (current) use of aspirin: Secondary | ICD-10-CM | POA: Diagnosis not present

## 2019-02-10 DIAGNOSIS — Z79899 Other long term (current) drug therapy: Secondary | ICD-10-CM | POA: Diagnosis not present

## 2019-02-10 DIAGNOSIS — R110 Nausea: Secondary | ICD-10-CM | POA: Diagnosis not present

## 2019-02-10 DIAGNOSIS — Z8673 Personal history of transient ischemic attack (TIA), and cerebral infarction without residual deficits: Secondary | ICD-10-CM | POA: Diagnosis not present

## 2019-02-10 DIAGNOSIS — R5383 Other fatigue: Secondary | ICD-10-CM | POA: Insufficient documentation

## 2019-02-10 DIAGNOSIS — R5381 Other malaise: Secondary | ICD-10-CM | POA: Diagnosis not present

## 2019-02-10 DIAGNOSIS — I10 Essential (primary) hypertension: Secondary | ICD-10-CM | POA: Diagnosis not present

## 2019-02-10 LAB — BASIC METABOLIC PANEL
Anion gap: 10 (ref 5–15)
BUN: 9 mg/dL (ref 8–23)
CO2: 26 mmol/L (ref 22–32)
Calcium: 9.1 mg/dL (ref 8.9–10.3)
Chloride: 102 mmol/L (ref 98–111)
Creatinine, Ser: 0.83 mg/dL (ref 0.44–1.00)
GFR calc Af Amer: >60 mL/min (ref >60)
GFR calc non Af Amer: >60 mL/min (ref >60)
Glucose, Bld: 92 mg/dL (ref 70–99)
Potassium: 3.8 mmol/L (ref 3.5–5.1)
Sodium: 138 mmol/L (ref 135–145)

## 2019-02-10 LAB — CBC
HCT: 39.9 % (ref 36.0–46.0)
Hemoglobin: 13.8 g/dL (ref 12.0–15.0)
MCH: 28.6 pg (ref 26.0–34.0)
MCHC: 34.6 g/dL (ref 30.0–36.0)
MCV: 82.6 fL (ref 80.0–100.0)
Platelets: 170 10*3/uL (ref 150–400)
RBC: 4.83 MIL/uL (ref 3.87–5.11)
RDW: 12.9 % (ref 11.5–15.5)
WBC: 6.5 10*3/uL (ref 4.0–10.5)
nRBC: 0 % (ref 0.0–0.2)

## 2019-02-10 LAB — TROPONIN I (HIGH SENSITIVITY)
Troponin I (High Sensitivity): 4 ng/L (ref <18)
Troponin I (High Sensitivity): 4 ng/L (ref <18)
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
## 2019-02-10 NOTE — ED Notes (Signed)
Pt assisted to bathroom

## 2019-02-10 NOTE — ED Triage Notes (Addendum)
Pt to ED from [facility] clinic after being seen for HTN. Pt reporting when she checked her BP at home today it was 172/106. Pt was sent from [facility] because of "problems with my electrical rhythm." Pts EKG appears normal and verified by EDP to be NSR. Pt reporting She was told her heart enzymes needed to be checked.   Pt reporting she was nausea this morning but has a significant hx of nausea with recent hx or surgery to stretch pts esophagus three weeks ago. When asked about current symptoms pt reports she "feels back to normal" but pt also reports she may need an increase in her anxiety medications due to increased stress recently.

## 2019-02-10 NOTE — ED Notes (Signed)

## 2019-02-10 NOTE — Discharge Instructions (Signed)
Work-up was reassuring.  There is no evidence of heart attack based upon your cardiac markers.  You should follow-up with your primary care doctor in 1 week for blood pressure recheck.  If it continues to be elevated you may need to start you on some kind of blood pressure medicine.  Return to the ER for fevers, shortness of breath, worsening pain or any other concerns

## 2019-02-10 NOTE — ED Notes (Signed)
Pt reports uses a BP cuff at home and her  BP get going up and down today and when it got real high she went to her MD. Pt reports her MD sent her to [facility] who then brought her here. Pt denies pain. MD in room to assess RN.

## 2019-02-19 DIAGNOSIS — Z7689 Persons encountering health services in other specified circumstances: Secondary | ICD-10-CM | POA: Diagnosis not present

## 2019-02-19 DIAGNOSIS — R7989 Other specified abnormal findings of blood chemistry: Secondary | ICD-10-CM | POA: Diagnosis not present

## 2019-02-19 DIAGNOSIS — F419 Anxiety disorder, unspecified: Secondary | ICD-10-CM | POA: Diagnosis not present

## 2019-03-07 ENCOUNTER — Other Ambulatory Visit
Admission: RE | Admit: 2019-03-07 | Discharge: 2019-03-07 | Disposition: A | Discharge status: Home / Self Care | Payer: PPO | Source: Ambulatory Visit | Attending: Physician Assistant | Admitting: Physician Assistant

## 2019-03-07 ENCOUNTER — Other Ambulatory Visit: Payer: PPO

## 2019-03-07 ENCOUNTER — Telehealth: Payer: Self-pay | Admitting: Urology

## 2019-03-07 ENCOUNTER — Other Ambulatory Visit: Payer: Self-pay

## 2019-03-07 DIAGNOSIS — N390 Urinary tract infection, site not specified: Secondary | ICD-10-CM

## 2019-03-07 LAB — URINALYSIS, ROUTINE W REFLEX MICROSCOPIC
Specific Gravity, Urine: 1.013 (ref 1.005–1.030)
WBC, UA: >50 WBC/hpf — ABNORMAL HIGH (ref 0–5)

## 2019-03-07 NOTE — Telephone Encounter (Signed)
Pt called office and thinks she has a UTI.  She is burning something fierce with urination.  She is going very frequently.

## 2019-03-07 NOTE — Telephone Encounter (Signed)
Pt added to lab schedule for urine drop off.

## 2019-03-09 LAB — URINE CULTURE: Culture: >=100000 — AB

## 2019-03-10 ENCOUNTER — Telehealth: Payer: Self-pay | Admitting: Physician Assistant

## 2019-03-10 MED ORDER — CEPHALEXIN 500 MG PO CAPS: 500.0000 mg | ORAL_CAPSULE | Freq: Two times a day (BID) | ORAL | 0 refills | Status: AC

## 2019-03-10 NOTE — Telephone Encounter (Signed)
Per urine culture results, Keflex 500 mg twice daily x7 days sent to [facility].  LMOM to inform patient.

## 2019-03-11 ENCOUNTER — Ambulatory Visit: Payer: PPO | Attending: Internal Medicine

## 2019-03-11 DIAGNOSIS — Z20822 Contact with and (suspected) exposure to covid-19: Secondary | ICD-10-CM

## 2019-03-11 DIAGNOSIS — Z20828 Contact with and (suspected) exposure to other viral communicable diseases: Secondary | ICD-10-CM | POA: Diagnosis not present

## 2019-03-13 LAB — NOVEL CORONAVIRUS, NAA: SARS-CoV-2, NAA: NOT DETECTED

## 2019-03-31 DIAGNOSIS — I10 Essential (primary) hypertension: Secondary | ICD-10-CM | POA: Diagnosis not present

## 2019-03-31 DIAGNOSIS — E0590 Thyrotoxicosis, unspecified without thyrotoxic crisis or storm: Secondary | ICD-10-CM | POA: Diagnosis not present

## 2019-03-31 DIAGNOSIS — R251 Tremor, unspecified: Secondary | ICD-10-CM | POA: Diagnosis not present

## 2019-03-31 DIAGNOSIS — F419 Anxiety disorder, unspecified: Secondary | ICD-10-CM | POA: Diagnosis not present

## 2019-03-31 DIAGNOSIS — E785 Hyperlipidemia, unspecified: Secondary | ICD-10-CM | POA: Diagnosis not present

## 2019-04-01 DIAGNOSIS — Z85828 Personal history of other malignant neoplasm of skin: Secondary | ICD-10-CM | POA: Diagnosis not present

## 2019-04-14 ENCOUNTER — Encounter: Payer: Self-pay | Admitting: Physician Assistant

## 2019-04-14 ENCOUNTER — Telehealth: Payer: Self-pay | Admitting: Urology

## 2019-04-14 ENCOUNTER — Other Ambulatory Visit: Payer: Self-pay

## 2019-04-14 ENCOUNTER — Ambulatory Visit (INDEPENDENT_AMBULATORY_CARE_PROVIDER_SITE_OTHER): Payer: PPO | Admitting: Physician Assistant

## 2019-04-14 VITALS — BP 124/86 | HR 75 | Ht 64.0 in | Wt 159.0 lb

## 2019-04-14 DIAGNOSIS — N390 Urinary tract infection, site not specified: Secondary | ICD-10-CM | POA: Diagnosis not present

## 2019-04-14 DIAGNOSIS — R300 Dysuria: Secondary | ICD-10-CM | POA: Diagnosis not present

## 2019-04-14 DIAGNOSIS — Z87440 Personal history of urinary (tract) infections: Secondary | ICD-10-CM

## 2019-04-14 LAB — URINALYSIS, COMPLETE
Bilirubin, UA: NEGATIVE
Glucose, UA: NEGATIVE
Ketones, UA: NEGATIVE
Nitrite, UA: POSITIVE — AB
Specific Gravity, UA: 1.025 (ref 1.005–1.030)
Urobilinogen, Ur: 2 mg/dL — ABNORMAL HIGH (ref 0.2–1.0)
pH, UA: 5.5 (ref 5.0–7.5)

## 2019-04-14 LAB — MICROSCOPIC EXAMINATION: WBC, UA: >30 /hpf — AB (ref 0–5)

## 2019-04-14 MED ORDER — LIDOCAINE HCL URETHRAL/MUCOSAL 2 % EX GEL
1.0000 "application " | Freq: Once | CUTANEOUS | Status: AC
  Administered 2019-04-14: 1 via URETHRAL

## 2019-04-14 NOTE — Telephone Encounter (Signed)
Pt called office and Friday night she started burning w/urination and frequency.  Then, it was so bad, when she urinated it would shoot pains down both arms, from her shoulders down.

## 2019-04-14 NOTE — Progress Notes (Signed)
04/14/2019 4:11 PM   Stephanie Bell 08-11-1940 [id number]  CC: Dysuria, urgency, frequency  HPI: Stephanie Bell is a 79 y.o. female who presents today for evaluation of possible UTI. She is an established [facility] patient with PMH rUTI who last saw me on 01/28/2019 for the same; she was treated with empiric doxycycline for presumed UTI  and Mycolog ointment for intertriginous candidiasis. Urine culture ultimately resulted with Klebsiella oxytoca. She also dropped off a urine sample on 03/07/2019 with similar complaints; culture resulted with nitrofurantoin-resistant Proteus and she was treated with Keflex.  Today, she reports a 3-day history of dysuria, urgency, and frequency.  She denies gross hematuria, fever, chills, nausea, vomiting, and flank pain.  She has taken Azo as recently as yesterday for symptom palliation.  In-office UA today positive for trace-intact blood, trace protein, nitrites, and 1+ leukocyte esterase; urine microscopy with >30 WBCs/HPF and many bacteria.  Of note, this would be her third urinary tract infection in 3 months.  She is not on suppressive antibiotics.  She has previously taken cranberry supplements, however she is not taking these now.  She is not on daily probiotics.  No personal history of breast cancer; she is not on topical vaginal estrogen cream.  She does report significant constipation and is currently scheduled for evaluation of Parkinson's disease.  PMH: Past Medical History:  Diagnosis Date  . ADD (attention deficit disorder)   . Anemia   . Esophageal stricture 11/04/2014  . GERD (gastroesophageal reflux disease)   . H/O hiatal hernia   . Hx: UTI (urinary tract infection)   . Interstitial cystitis   . Kidney stone   . Lumbago   . Palpitations 02/2017  . Paraesophageal hernia   . Pneumonia 05-09-11   2011 last time  . Stroke ([place])   . Weight loss   . Wound disruption, post-op, skin   . 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 BACK SURGERY  05-09-11   x 2 lumbar  . CHOLECYSTECTOMY  05-09-11   laparoscopic  . COLONOSCOPY  2002, 2008    rectal hyperplastic polyp 2002, diverticulosis 2008  . ESOPHAGOGASTRODUODENOSCOPY    . HERNIA REPAIR  05/16/2011   hiatal hernia  . HIATAL HERNIA REPAIR  02/09/2012   Procedure: LAPAROSCOPIC REPAIR OF HIATAL HERNIA;  Surgeon: Stephanie Earls, MD;  Location: [facility];  Service: General;  Laterality: N/A;  . INSERTION OF MESH  02/09/2012   Procedure: INSERTION OF MESH;  Surgeon: Stephanie Earls, MD;  Location: [facility];  Service: General;;  . IRRIGATION AND DEBRIDEMENT SEBACEOUS CYST  05/16/2011   Procedure: IRRIGATION AND DEBRIDEMENT SEBACEOUS CYST;  Surgeon: Stephanie Earls, MD;  Location: [facility];  Service: General;  Laterality: N/A;  middle of chest on sternum  . LAPAROSCOPIC NISSEN FUNDOPLICATION  [id number]   Procedure: LAPAROSCOPIC NISSEN FUNDOPLICATION;  Surgeon: Stephanie Earls, MD;  Location: [facility];  Service: General;  Laterality: N/A;  will also remove sebaceous cyst of chest wall  . LAPAROSCOPIC NISSEN FUNDOPLICATION  [id number]   Procedure: LAPAROSCOPIC NISSEN FUNDOPLICATION;  Surgeon: Stephanie Earls, MD;  Location: [facility];  Service: General;  Laterality: N/A;  Re-do Laparoscopic Nissen   . SEPTOPLASTY    . TONSILLECTOMY  05-09-11   Tonsillectomy  . UMBILICAL HERNIA REPAIR  05/16/2011   Procedure: HERNIA REPAIR UMBILICAL ADULT;  Surgeon: Stephanie Earls, MD;  Location: [facility];  Service: General;  Laterality: N/A;  with mesh   . UPPER GASTROINTESTINAL ENDOSCOPY    . VAGINAL HYSTERECTOMY      Home Medications:  Allergies as of 04/14/2019      Reactions   Benzocaine Palpitations   Specific agent was Scandonest 2 %   Epinephrine Hcl (nasal) Palpitations, Other (See Comments)   Reaction:  Tachycardia    Omeprazole Diarrhea   Sulfa Antibiotics Swelling, Rash      Medication List        Accurate as of April 14, 2019  4:11 PM. If you have any questions, ask your nurse or doctor.        aspirin EC 81 MG tablet Take 81 mg by mouth daily.   Cranberry-Vitamin C 84-20 MG Caps Take 2 capsules by mouth daily.   doxycycline 100 MG capsule Commonly known as: VIBRAMYCIN 1 capsule twice daily x7 days at onset of UTI symptoms.   ibuprofen 200 MG tablet Commonly known as: ADVIL Take 200 mg by mouth every 6 (six) hours as needed for headache or mild pain.   Multivitamin Women 50+ Tabs Take 1 tablet by mouth daily.   nystatin-triamcinolone ointment Commonly known as: MYCOLOG Apply 1 application topically 2 (two) times daily.   ondansetron 4 MG disintegrating tablet Commonly known as: Zofran ODT Dissolve 1 tablet on the tongue every 6 hours as needed for nausea.   promethazine 25 MG suppository Commonly known as: Phenergan Place 1 suppository (25 mg total) rectally every 6 (six) hours as needed for nausea.   promethazine 25 MG tablet Commonly known as: PHENERGAN Take 0.5 tablets (12.5 mg total) by mouth every 8 (eight) hours as needed for nausea or vomiting.   sertraline 25 MG tablet Commonly known as: ZOLOFT Take 25 mg by mouth daily.   TUMS PO Take by mouth as needed.   VITAMIN B-12 PO Take 1 tablet by mouth daily.   Vitamin D3 125 MCG (5000 UT) Caps Take 5,000 Units by mouth daily.       Allergies:  Allergies  Allergen Reactions  . Benzocaine Palpitations    Specific agent was Scandonest 2 %  . Epinephrine Hcl (Nasal) Palpitations and Other (See Comments)    Reaction:  Tachycardia   . Omeprazole Diarrhea  . Sulfa Antibiotics Swelling and Rash    Family History: Family History  Problem Relation Age of Onset  . Ulcers Father   . Alcohol abuse Father   . Alzheimer's disease Mother   . Stroke Mother   . Diabetes Mother   . Bipolar disorder Mother   . Ulcers Paternal Grandmother   . Colon cancer Neg Hx   . Rectal cancer Neg Hx   . Stomach  cancer Neg Hx   . Esophageal cancer Neg Hx     Social History:   reports that she has never smoked. She has never used smokeless tobacco. She reports previous alcohol use of about 1.0 standard drinks of alcohol per week. She reports that she does not use drugs.  ROS: UROLOGY Frequent Urination?: Yes Hard to postpone urination?: Yes Burning/pain with urination?: Yes Get up at night to urinate?: No Leakage of urine?: Yes Urine stream starts and stops?: No Trouble starting stream?: No Do you have to strain to urinate?: No Blood in urine?: No Urinary tract infection?: Yes Sexually transmitted disease?: No Injury to kidneys or bladder?: No Painful intercourse?: No Weak stream?: No Currently pregnant?: No Vaginal bleeding?: No Last menstrual period?: n/a  Gastrointestinal Nausea?: No Vomiting?: No Indigestion/heartburn?:  No Diarrhea?: No Constipation?: Yes  Constitutional Fever: No Night sweats?: No Weight loss?: Yes Fatigue?: No  Skin Skin rash/lesions?: No Itching?: No  Eyes Blurred vision?: No Double vision?: No  Ears/Nose/Throat Sore throat?: No Sinus problems?: No  Hematologic/Lymphatic Swollen glands?: No Easy bruising?: No  Cardiovascular Leg swelling?: No Chest pain?: No  Respiratory Cough?: No Shortness of breath?: No  Endocrine Excessive thirst?: No  Musculoskeletal Back pain?: No Joint pain?: No  Neurological Headaches?: No Dizziness?: No  Psychologic Depression?: No Anxiety?: Yes  Physical Exam: BP 124/86 (BP Location: Left Arm, Patient Position: Sitting, Cuff Size: Normal)   Pulse 75   Ht 5\' 4"  (1.626 m)   Wt 159 lb (72.1 kg)   BMI 27.29 kg/m   Constitutional:  Alert and oriented, no acute distress, nontoxic appearing HEENT: [place], AT Cardiovascular: No clubbing, cyanosis, or edema Respiratory: Normal respiratory effort, no increased work of breathing GU: 1 x 2 cm patch of erythema along the left inguinal crease with  satellite lesions visualized Skin: No rashes, bruises or suspicious lesions Neurologic: Grossly intact, moving all 4 extremities; tremulous Psychiatric: Normal mood and affect  Laboratory Data: Results for orders placed or performed in visit on 04/14/19  Microscopic Examination   URINE  Result Value Ref Range   WBC, UA >30 (A) 0 - 5 /hpf   RBC 0-2 0 - 2 /hpf   Epithelial Cells (non renal) 0-10 0 - 10 /hpf   Bacteria, UA Many (A) None seen/Few  Urinalysis, Complete  Result Value Ref Range   Specific Gravity, UA 1.025 1.005 - 1.030   pH, UA 5.5 5.0 - 7.5   Color, UA Orange Yellow   Appearance Ur Cloudy (A) Clear   Leukocytes,UA 1+ (A) Negative   Protein,UA Trace (A) Negative/Trace   Glucose, UA Negative Negative   Ketones, UA Negative Negative   RBC, UA Trace (A) Negative   Bilirubin, UA Negative Negative   Urobilinogen, Ur 2.0 (H) 0.2 - 1.0 mg/dL   Nitrite, UA Positive (A) Negative   Microscopic Examination See below:    Assessment & Plan:   1. Dysuria 79 year old female with history of recurrent UTI here with 3 days of dysuria, urgency, and frequency.  UA nitrite positive on Azo, also with significant pyuria and bacteriuria.  Given frequency of recent infections, will defer empiric therapy and send urine for culture today.  I counseled her that I would contact her with her results and treatment recommendations as soon as I have them.  She expressed understanding.  Of note, we did obtain a catheterized urine sample today secondary to tremor. - Urinalysis, Complete - CULTURE, URINE COMPREHENSIVE - lidocaine (XYLOCAINE) 2 % jelly 1 application  2. History of recurrent UTI (urinary tract infection) Given likely third UTI in 3 months, counseled patient to initiate UTI prevention strategies today including the use of a daily probiotic, cranberry supplementation twice daily on an empty stomach, and topical vaginal estrogen cream 3 times weekly.  Patient reports difficulty swallowing  large pills and would prefer probiotic yogurt over tablets.  I am amenable with this plan.  Premarin cream sample and cost savings coupon provided to her today; I counseled her to contact the office when she is ready for prescription for this.  Return if symptoms worsen or fail to improve.   I spent 25 min with this patient, of which greater than 50% was spent in counseling and coordination of care with the patient.    Debroah Loop, PA-C  [facility] [address], [place], [place] [address] [phone]

## 2019-04-14 NOTE — Patient Instructions (Addendum)
1. Begin daily probiotic or yogurt  2. Start twice daily cranberry supplement on empty stomach 3. Start Premarin Cream (estrogen) 3 times weekly (Mon, Wed, Fri) Call office when you have finished samples     Probiotics Probiotics are the good bacteria and yeasts that live in your body and keep your digestive system healthy. Probiotics also help your body's defense system (immune system) and protect your body against the growth of harmful bacteria. Your health care provider may recommend taking a probiotic if you are taking antibiotics or have certain medical conditions, such as:  Diarrhea.  Constipation.  Irritable bowel syndrome.  Lung infections.  Yeast infections.  Acne, eczema, and other skin conditions.  Frequent urinary tract infections. What affects the balance of bacteria in my body? The balance of good bacteria in your body can be affected by:  Antibiotic medicines. These medicines treat infections caused by bacteria. Unfortunately, they may kill the good bacteria in your body as well as the bad bacteria.  Certain medical conditions. Conditions related to an imbalance of bacteria include: ? Stomach and intestine (gastrointestinal) infections. ? Lung infections. ? Skin infections. ? Vaginal infections. ? Inflammatory bowel diseases. ? Stomach ulcers (gastric ulcers). ? Tooth decay and gum disease (periodontal disease).  Stress.  Poor diet. What type of probiotic is right for me? Probiotics contain different types of bacteria (strains). Strains commonly found in probiotics include:  Lactobacillus.  Saccharomyces.  Bifidobacterium. Specific strains have been shown to be more effective for certain health conditions. Ask your health care provider which strain or strains you should use and how often. Probiotics come in many different forms, strain combinations, and strengths. Some may need to be refrigerated. Always read the label for storage and usage  instructions. Certain foods, such as yogurt, contain probiotics. Probiotics can also be bought as a supplement at a pharmacy, health food store, or grocery store. Talk to your health care provider before starting any supplement. What are the side effects of probiotics? Some people have side effects when taking probiotics. Side effects are usually temporary and may include:  Gas.  Bloating.  Cramping. Serious side effects are rare. Follow these instructions at home:   If you are taking probiotics with antibiotics: ? Wait at least 2 hours between taking your medicine and the probiotic. ? Eat foods high in fiber, such as whole grains, beans, and vegetables. These foods can help good bacteria grow. ? Avoid certain foods as told by your health care provider. Summary  Probiotics are the good bacteria and yeasts that live in your body and keep you and your digestive system healthy.  Certain foods, such as yogurt, contain probiotics.  Probiotics can be taken as supplements. They can be bought at a pharmacy, health food store, or grocery store. They come in many different forms, strain combinations, and strengths.  Be sure to talk with your health care provider before taking a probiotic supplement. This information is not intended to replace advice given to you by your health care provider. Make sure you discuss any questions you have with your health care provider. Document Revised: 11/23/2017 Document Reviewed: 03/21/2017 Elsevier Patient Education  2020 [employer].

## 2019-04-14 NOTE — Telephone Encounter (Signed)
Needs an appt

## 2019-04-15 ENCOUNTER — Ambulatory Visit: Payer: PPO | Admitting: Physician Assistant

## 2019-04-16 DIAGNOSIS — R238 Other skin changes: Secondary | ICD-10-CM | POA: Diagnosis not present

## 2019-04-16 DIAGNOSIS — L821 Other seborrheic keratosis: Secondary | ICD-10-CM | POA: Diagnosis not present

## 2019-04-16 DIAGNOSIS — L820 Inflamed seborrheic keratosis: Secondary | ICD-10-CM | POA: Diagnosis not present

## 2019-04-17 ENCOUNTER — Telehealth: Payer: Self-pay

## 2019-04-17 ENCOUNTER — Other Ambulatory Visit: Payer: Self-pay | Admitting: Physician Assistant

## 2019-04-17 LAB — CULTURE, URINE COMPREHENSIVE

## 2019-04-17 MED ORDER — CEPHALEXIN 500 MG PO CAPS: 500.0000 mg | ORAL_CAPSULE | Freq: Two times a day (BID) | ORAL | 0 refills | Status: AC

## 2019-04-17 NOTE — Telephone Encounter (Signed)
Will start her on empiric antibiotics today pending urine culture results. Keflex 500mg  BID x7 days sent to her pharmacy. Please call patient to notify.

## 2019-04-17 NOTE — Telephone Encounter (Signed)
Patient notified

## 2019-04-17 NOTE — Telephone Encounter (Signed)
Patient called today stating her urinary symptoms are worse. Prelim report on urine culture shows no growth still waiting on final report. Patient is using AZO, premarin cream and cranberry tablets, but states her burning is worse and she is now experiencing bladder spasms. She describes a strong urge to urinate and soaking through pads as well as a shocking sensation with urination.

## 2019-04-22 DIAGNOSIS — H524 Presbyopia: Secondary | ICD-10-CM | POA: Diagnosis not present

## 2019-04-22 DIAGNOSIS — H5203 Hypermetropia, bilateral: Secondary | ICD-10-CM | POA: Diagnosis not present

## 2019-04-22 DIAGNOSIS — H52223 Regular astigmatism, bilateral: Secondary | ICD-10-CM | POA: Diagnosis not present

## 2019-04-22 DIAGNOSIS — H2513 Age-related nuclear cataract, bilateral: Secondary | ICD-10-CM | POA: Diagnosis not present

## 2019-04-22 DIAGNOSIS — H25013 Cortical age-related cataract, bilateral: Secondary | ICD-10-CM | POA: Diagnosis not present

## 2019-04-30 DIAGNOSIS — Z85828 Personal history of other malignant neoplasm of skin: Secondary | ICD-10-CM | POA: Diagnosis not present

## 2019-05-01 ENCOUNTER — Encounter: Payer: Self-pay | Admitting: Physician Assistant

## 2019-05-01 ENCOUNTER — Telehealth: Payer: Self-pay

## 2019-05-01 ENCOUNTER — Other Ambulatory Visit: Payer: Self-pay

## 2019-05-01 ENCOUNTER — Ambulatory Visit: Payer: PPO | Admitting: Physician Assistant

## 2019-05-01 VITALS — BP 120/74 | HR 78 | Ht 68.0 in | Wt 159.0 lb

## 2019-05-01 DIAGNOSIS — B372 Candidiasis of skin and nail: Secondary | ICD-10-CM | POA: Diagnosis not present

## 2019-05-01 DIAGNOSIS — Z87440 Personal history of urinary (tract) infections: Secondary | ICD-10-CM

## 2019-05-01 DIAGNOSIS — R300 Dysuria: Secondary | ICD-10-CM

## 2019-05-01 LAB — URINALYSIS, COMPLETE
Bilirubin, UA: NEGATIVE
Glucose, UA: NEGATIVE
Ketones, UA: NEGATIVE
Nitrite, UA: POSITIVE — AB
Specific Gravity, UA: 1.025 (ref 1.005–1.030)
Urobilinogen, Ur: 4 mg/dL — ABNORMAL HIGH (ref 0.2–1.0)
pH, UA: 6 (ref 5.0–7.5)

## 2019-05-01 LAB — MICROSCOPIC EXAMINATION: WBC, UA: >30 /hpf — AB (ref 0–5)

## 2019-05-01 MED ORDER — FLUCONAZOLE 150 MG PO TABS: 150.0000 mg | ORAL_TABLET | Freq: Once | ORAL | 0 refills | Status: AC

## 2019-05-01 MED ORDER — FLUCONAZOLE 200 MG PO TABS: 200.0000 mg | ORAL_TABLET | Freq: Every day | ORAL | 0 refills | Status: DC

## 2019-05-01 NOTE — Progress Notes (Signed)
05/01/2019 2:25 PM   Stephanie Bell 06-27-1940 [id number]  CC: Dysuria, frequency  HPI: Stephanie Bell is a 79 y.o. female who presents today for evaluation of possible UTI.  PMH significant for recurrent UTI, most recently seen by me for the same approximately 2 and half weeks ago.  Urine culture at that visit positive for ampicillin and Macrobid resistant Klebsiella.  I treated her with culture appropriate Keflex 500 mg twice daily x7 days and counseled her to start daily probiotics, cranberry supplementation, and topical vaginal estrogen cream.  Contributing factors include chronic constipation.  Today, she reports that she felt better on the prescribed Keflex with ultimate return of dysuria and urinary frequency 5 days ago.  She has been taking Azo for symptom management, however she notes it has not been as helpful with this round of symptoms than it has been in the past.  She has started twice daily cranberry supplementation, topical vaginal estrogen cream, and is eating more yogurt as a source of probiotic cultures.  I had a lengthy conversation with the patient today regarding her history of recurrent UTI.  These infections date back to 1959.  She has been on several rounds of suppressive antibiotics, including daily and postcoital prophylaxis regimens.  She reports that her UTIs were originally treated in the 1960s with "sulfur" and that after 10 years of this therapy, she developed a mild rash associated with this.  She no longer believes her infections are temporally correlated with coitus.  Notably, her 2 most recent urine cultures were resistant to Macrobid.  She underwent CTAP with contrast in January 2020, with no abnormalities noted of the urinary tract.  She has also undergone multiple cystoscopies without significant findings.  Per recommendations by Dr. Bernardo Heater, will defer further cystoscopy unless she develops gross hematuria or unexplained microscopic hematuria.  I  obtained a catheterized urine sample today for in-office UA and microscopy.  These tests were run twice, once at the time of her visit and once in the afternoon.  Upon first run, UA dip with multiple suspected false positive readings secondary to Azo use with a positive ictotest; urine microscopy with >30 WBCs.hpf and yeast, described as abundant by the lab.  Due to concerns for false positive ictotest, I requested that the UA be rerun.  Repeat UA this afternoon positive for trace-intact blood, 1+ protein, 4.0 EU/dL urobilinogen, nitrites, and 1+ leukocyte esterase with a negative ictotest; urine microscopy with >30 WBCs/HPF and calcium oxalate crystals.  Many bacteria were noted upon the afternoon repeat microscopy, however this is believed to be a falsely elevated result due to in vitro bacterial amplification.  PMH: Past Medical History:  Diagnosis Date  . ADD (attention deficit disorder)   . Anemia   . Esophageal stricture 11/04/2014  . GERD (gastroesophageal reflux disease)   . H/O hiatal hernia   . Hx: UTI (urinary tract infection)   . Interstitial cystitis   . Kidney stone   . Lumbago   . Palpitations 02/2017  . Paraesophageal hernia   . Pneumonia 05-09-11   2011 last time  . Stroke ([place])   . Weight loss   . Wound disruption, post-op, skin   . 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 BACK SURGERY  05-09-11   x 2 lumbar  . CHOLECYSTECTOMY  05-09-11   laparoscopic  . COLONOSCOPY  2002, 2008    rectal hyperplastic polyp 2002, diverticulosis 2008  .  ESOPHAGOGASTRODUODENOSCOPY    . HERNIA REPAIR  05/16/2011   hiatal hernia  . HIATAL HERNIA REPAIR  02/09/2012   Procedure: LAPAROSCOPIC REPAIR OF HIATAL HERNIA;  Surgeon: Pedro Earls, MD;  Location: [facility];  Service: General;  Laterality: N/A;  . INSERTION OF MESH  02/09/2012   Procedure: INSERTION OF MESH;  Surgeon: Pedro Earls, MD;  Location:  [facility];  Service: General;;  . IRRIGATION AND DEBRIDEMENT SEBACEOUS CYST  05/16/2011   Procedure: IRRIGATION AND DEBRIDEMENT SEBACEOUS CYST;  Surgeon: Pedro Earls, MD;  Location: [facility];  Service: General;  Laterality: N/A;  middle of chest on sternum  . LAPAROSCOPIC NISSEN FUNDOPLICATION  [id number]   Procedure: LAPAROSCOPIC NISSEN FUNDOPLICATION;  Surgeon: Pedro Earls, MD;  Location: [facility];  Service: General;  Laterality: N/A;  will also remove sebaceous cyst of chest wall  . LAPAROSCOPIC NISSEN FUNDOPLICATION  [id number]   Procedure: LAPAROSCOPIC NISSEN FUNDOPLICATION;  Surgeon: Pedro Earls, MD;  Location: [facility];  Service: General;  Laterality: N/A;  Re-do Laparoscopic Nissen   . SEPTOPLASTY    . TONSILLECTOMY  05-09-11   Tonsillectomy  . UMBILICAL HERNIA REPAIR  05/16/2011   Procedure: HERNIA REPAIR UMBILICAL ADULT;  Surgeon: Pedro Earls, MD;  Location: [facility];  Service: General;  Laterality: N/A;  with mesh   . UPPER GASTROINTESTINAL ENDOSCOPY    . VAGINAL HYSTERECTOMY      Home Medications:  Allergies as of 05/01/2019      Reactions   Benzocaine Palpitations   Specific agent was Scandonest 2 %   Epinephrine Hcl (nasal) Palpitations, Other (See Comments)   Reaction:  Tachycardia    Omeprazole Diarrhea   Sulfa Antibiotics Swelling, Rash      Medication List       Accurate as of May 01, 2019  2:25 PM. If you have any questions, ask your nurse or doctor.        aspirin EC 81 MG tablet Take 81 mg by mouth daily.   Cranberry-Vitamin C 84-20 MG Caps Take 2 capsules by mouth daily.   doxycycline 100 MG capsule Commonly known as: VIBRAMYCIN 1 capsule twice daily x7 days at onset of UTI symptoms.   fluconazole 200 MG tablet Commonly known as: DIFLUCAN Take 1 tablet (200 mg total) by mouth daily for 14 days. Started by: Debroah Loop, PA-C   ibuprofen 200 MG tablet Commonly known as: ADVIL Take 200 mg by mouth every 6 (six) hours as needed  for headache or mild pain.   Multivitamin Women 50+ Tabs Take 1 tablet by mouth daily.   nystatin-triamcinolone ointment Commonly known as: MYCOLOG Apply 1 application topically 2 (two) times daily.   ondansetron 4 MG disintegrating tablet Commonly known as: Zofran ODT Dissolve 1 tablet on the tongue every 6 hours as needed for nausea.   promethazine 25 MG suppository Commonly known as: Phenergan Place 1 suppository (25 mg total) rectally every 6 (six) hours as needed for nausea.   promethazine 25 MG tablet Commonly known as: PHENERGAN Take 0.5 tablets (12.5 mg total) by mouth every 8 (eight) hours as needed for nausea or vomiting.   sertraline 25 MG tablet Commonly known as: ZOLOFT Take 25 mg by mouth daily.   TUMS PO Take by mouth as needed.   VITAMIN B-12 PO Take 1 tablet by mouth daily.   Vitamin D3 125 MCG (5000 UT) Caps Take 5,000 Units by mouth daily.       Allergies:  Allergies  Allergen Reactions  . Benzocaine Palpitations    Specific agent was Scandonest 2 %  . Epinephrine Hcl (Nasal) Palpitations and Other (See Comments)    Reaction:  Tachycardia   . Omeprazole Diarrhea  . Sulfa Antibiotics Swelling and Rash    Family History: Family History  Problem Relation Age of Onset  . Ulcers Father   . Alcohol abuse Father   . Alzheimer's disease Mother   . Stroke Mother   . Diabetes Mother   . Bipolar disorder Mother   . Ulcers Paternal Grandmother   . Colon cancer Neg Hx   . Rectal cancer Neg Hx   . Stomach cancer Neg Hx   . Esophageal cancer Neg Hx     Social History:   reports that she has never smoked. She has never used smokeless tobacco. She reports previous alcohol use of about 1.0 standard drinks of alcohol per week. She reports that she does not use drugs.  Physical Exam: BP 120/74   Pulse 78   Ht 5\' 8"  (1.727 m)   Wt 159 lb (72.1 kg)   BMI 24.18 kg/m   Constitutional:  Alert and oriented, no acute distress, nontoxic  appearing HEENT: [place], AT Cardiovascular: No clubbing, cyanosis, or edema Respiratory: Normal respiratory effort, no increased work of breathing GU: Persistent left candidal intertrigo, red-orange staining of the pubic hair and inner thighs Skin: No rashes, bruises or suspicious lesions Neurologic: Grossly intact, no focal deficits, moving all 4 extremities Psychiatric: Normal mood and affect  Laboratory Data: Results for orders placed or performed in visit on 05/01/19  Microscopic Examination   URINE  Result Value Ref Range   WBC, UA >30 (A) 0 - 5 /hpf   RBC 0-2 0 - 2 /hpf   Epithelial Cells (non renal) 0-10 0 - 10 /hpf   Crystals Present (A) N/A   Crystal Type Calcium Oxalate N/A   Bacteria, UA Few None seen/Few  Urinalysis, Complete  Result Value Ref Range   Specific Gravity, UA 1.025 1.005 - 1.030   pH, UA 6.0 5.0 - 7.5   Color, UA Orange Yellow   Appearance Ur Cloudy (A) Clear   Leukocytes,UA 1+ (A) Negative   Protein,UA 1+ (A) Negative/Trace   Glucose, UA Negative Negative   Ketones, UA Negative Negative   RBC, UA Trace (A) Negative   Bilirubin, UA Negative Negative   Urobilinogen, Ur 4.0 (H) 0.2 - 1.0 mg/dL   Nitrite, UA Positive (A) Negative   Microscopic Examination See below:    Assessment & Plan:   1. Dysuria 79 year old female with PMH recurrent UTI here with recurrence of dysuria and urinary urgency despite completing culture appropriate treatment 1-2 weeks ago.  UA and microscopy initially concerning for candidal cystitis and so I originally prescribed Diflucan 200 mg daily x14 days.  Repeat UA and microscopy reveals no yeast with persistent pyuria and calcium oxalate crystals.  No yeast noted; I contacted the patient in the afternoon via telephone to explain that she should not take this medication; she reported not having picked it up from the pharmacy yet.  Will send for culture and defer antibiotics pending her results due to concerns for postinfective  inflammation of the bladder vs. new UTI. I counseled her that I would contact her when I get her results. - Urinalysis, Complete - CULTURE, URINE COMPREHENSIVE - Microscopic Examination  2. History of recurrent UTI (urinary tract infection) May consider starting a trial of suppressive antibiotics pending urine culture results.  I  do not recommend Macrobid, given her recent Macrobid resistant urine cultures.  Would consider trimethoprim with close monitoring of initiation given that her medication allergy appears to be to sulfonamide drug in her reaction was mild in nature.  3. Candidiasis, intertriginous Prescribing Diflucan 150mg  x1 for treatment of persistent left intertriginous candidiasis. - fluconazole (DIFLUCAN) 150 MG tablet; Take 1 tablet (150 mg total) by mouth once for 1 dose.  Dispense: 1 tablet; Refill: 0   Return if symptoms worsen or fail to improve.  Debroah Loop, PA-C  [facility] [address] [place], [place] [address] [phone]

## 2019-05-01 NOTE — Telephone Encounter (Signed)
Patient called stating that she started having symptoms of UTI again a few days ago. Frequency, and very bad  Dysuria. Patient was added on to PA schedule today for further evaluation

## 2019-05-04 LAB — CULTURE, URINE COMPREHENSIVE

## 2019-05-05 ENCOUNTER — Telehealth: Payer: Self-pay | Admitting: Physician Assistant

## 2019-05-05 MED ORDER — CIPROFLOXACIN HCL 250 MG PO TABS: 250.0000 mg | ORAL_TABLET | Freq: Two times a day (BID) | ORAL | 0 refills | Status: AC

## 2019-05-05 MED ORDER — TRIMETHOPRIM 100 MG PO TABS: 100.0000 mg | ORAL_TABLET | Freq: Every day | ORAL | 6 refills | Status: DC

## 2019-05-05 NOTE — Telephone Encounter (Signed)
Notified patient as instructed, patient pleased. Discussed follow-up appointments, patient agrees  

## 2019-05-05 NOTE — Telephone Encounter (Signed)
Please contact the patient and inform her that I have received the results of her urine culture.  I have sent in two prescriptions to [facility]: The first, ciprofloxacin, is to treat her current infection.  She is to take this twice daily for 5 days.  The day after she completes this medication, I would like her to start daily trimethoprim for prevention of urinary tract infections.  I have prescribed enough trimethoprim to last until her scheduled follow-up with Dr. Bernardo Heater later this year.  Please counsel her to monitor her symptoms closely on the trimethoprim.  If she develops rash, itching, shortness of breath, or swelling of her mouth, tongue, or throat, she should stop this medication immediately and contact our office for assistance.  If she develops the symptoms outside of office hours, 8 AM to 5 PM Monday through Friday, she should proceed to the emergency room.

## 2019-05-14 DIAGNOSIS — E559 Vitamin D deficiency, unspecified: Secondary | ICD-10-CM | POA: Diagnosis not present

## 2019-05-14 DIAGNOSIS — E538 Deficiency of other specified B group vitamins: Secondary | ICD-10-CM | POA: Diagnosis not present

## 2019-05-14 DIAGNOSIS — K5909 Other constipation: Secondary | ICD-10-CM | POA: Diagnosis not present

## 2019-05-14 DIAGNOSIS — G20 Parkinson's disease: Secondary | ICD-10-CM | POA: Diagnosis not present

## 2019-05-21 ENCOUNTER — Other Ambulatory Visit: Payer: Self-pay | Admitting: Neurology

## 2019-05-21 ENCOUNTER — Other Ambulatory Visit (HOSPITAL_COMMUNITY): Payer: Self-pay | Admitting: Neurology

## 2019-05-21 DIAGNOSIS — G20C Parkinsonism, unspecified: Secondary | ICD-10-CM

## 2019-05-21 DIAGNOSIS — G20 Parkinson's disease: Secondary | ICD-10-CM

## 2019-05-23 ENCOUNTER — Ambulatory Visit (HOSPITAL_COMMUNITY): Payer: PPO

## 2019-05-23 ENCOUNTER — Telehealth: Payer: Self-pay | Admitting: Neurology

## 2019-05-23 NOTE — Telephone Encounter (Signed)
This patient is seeing Dr. Manuella Ghazi at [facility], she just had an appointment. Please explain to her that I am going to cancel her April appointment since she has another neurologist, thanks

## 2019-05-26 NOTE — Telephone Encounter (Signed)
I called the pt & discussed the message from Dr. Jaynee Eagles. The pt stated she is going to keep her appt with Dr. Jaynee Eagles. She had forgotten that she had seen Dr. Jaynee Eagles in the pas. She said she is not going to continue seeing Dr. Manuella Ghazi. She did not schedule the MRI ordered by him either. The pt said she would discuss at the appt with Dr. Jaynee Eagles.   Dr. Jaynee Eagles aware.

## 2019-05-26 NOTE — Telephone Encounter (Signed)
done

## 2019-05-27 ENCOUNTER — Encounter: Payer: Self-pay | Admitting: *Deleted

## 2019-05-27 NOTE — Telephone Encounter (Signed)
Dr. Manuella Ghazi is in [place]. We can see his note. Sent pt a Therapist, music.

## 2019-05-27 NOTE — Telephone Encounter (Signed)
Pt called wanting to know if everything has been sent over from dr.shah's office as she will be Dr.Aherns patient.

## 2019-06-04 DIAGNOSIS — W182XXA Fall in (into) shower or empty bathtub, initial encounter: Secondary | ICD-10-CM | POA: Diagnosis not present

## 2019-06-04 DIAGNOSIS — S4991XA Unspecified injury of right shoulder and upper arm, initial encounter: Secondary | ICD-10-CM | POA: Diagnosis not present

## 2019-06-04 DIAGNOSIS — M25511 Pain in right shoulder: Secondary | ICD-10-CM | POA: Diagnosis not present

## 2019-06-04 DIAGNOSIS — S79911A Unspecified injury of right hip, initial encounter: Secondary | ICD-10-CM | POA: Diagnosis not present

## 2019-06-12 DIAGNOSIS — M25511 Pain in right shoulder: Secondary | ICD-10-CM | POA: Diagnosis not present

## 2019-06-12 DIAGNOSIS — M25551 Pain in right hip: Secondary | ICD-10-CM | POA: Diagnosis not present

## 2019-06-12 DIAGNOSIS — M25559 Pain in unspecified hip: Secondary | ICD-10-CM | POA: Diagnosis not present

## 2019-06-30 ENCOUNTER — Encounter: Payer: Self-pay | Admitting: Neurology

## 2019-06-30 ENCOUNTER — Other Ambulatory Visit: Payer: Self-pay

## 2019-06-30 ENCOUNTER — Ambulatory Visit: Payer: PPO | Admitting: Neurology

## 2019-06-30 VITALS — BP 132/88 | HR 90 | Temp 97.2°F | Ht 65.0 in | Wt 160.0 lb

## 2019-06-30 DIAGNOSIS — G20 Parkinson's disease: Secondary | ICD-10-CM | POA: Diagnosis not present

## 2019-06-30 MED ORDER — CARBIDOPA-LEVODOPA 25-100 MG PO TABS: 1.0000 | ORAL_TABLET | Freq: Three times a day (TID) | ORAL | 1 refills | Status: DC

## 2019-06-30 NOTE — Progress Notes (Signed)
[facility]    Provider:  Dr Jaynee Eagles Referring Provider: [place] Pink, MD Primary Care Physician:  [place] Pink, MD  CC:  parkinson's disease  Interval history June 30, 2019: Patient was seen in the past for thalamic stroke and tremor, we did note she had some mild parkinsonian symptoms at last appointment in 2019(she never followed up with [place]), we discussed further testing and a DaTscan at that time she agreed to the [place] but then changed her mind and declined, since I last saw her in 2019 she has been diagnosed with Parkinson's disease and she has already seen a neurologist.  She saw Dr. 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  MRI of the brain was ordered as well as vitamin B12 and vitamin D, she was started on carbidopa levodopa 3 times a day, MiraLAX Dulcolax and enema for constipation, she was talked to at length about her UTI, patient has a long history of UTIs.  Patient is here alone, As mentioned above, we saw her 2 years ago and recommend DatScan at that time which she declined but we suspected early parkinson's disease. In the 2 years(she never followed up) her symptoms have progressed and she is on sinemet currently see by another neurologist. She has left resting tremor, worsening, she had one fall but she denies falling more often, she is walking slowly, she reports vivid dreams. She doesn't feel her memory is as good (she doesn't remember meeting me in the past or [place] talking about her parkinson's symptoms). She has a son who is POA he lives in [place], other son lives in [place], daughter has done a lot of research on parkinson's disease she she has lots of support.   Interval history 04/30/2017: Patient is  here for follow-up of tremor.  Her last appointment we discussed that she had some mild parkinsonian symptoms.  Discussed the differential which includes Parkinson's disease.  The options were to start medication, follow clinically, or perform a DaTscan.  At that time she wanted to perform the DaTscan but she changed her mind.  Today we discussed parkinsonism in all the different reasons.  She continues to have tremors, resting and action.  Discussed Parkinson's disease, also essential tremor.  Discussed medication management and Parkinson's disease including dopamine, dopamine agonists and others.  At this time she decided that she like to follow clinically which I agree with.  We will see her back in 6 months.  Otherwise she is doing excellent, she is playing tennis.   Interval history 12/25/2016; She has noticed tremor in her left hand noticed it first in may when driving and more recently when holding something heavy, she notices it at rest as well. She has some stress in her life. Happens every day, No caffeine. She is on aspirin. No tremor in the family. No FHx of Parkinson's. No slowing, no shuffling, no falls so far. No hallucinations or delusions. She reports poor memory. She uses a walker at home, she says she has slowed down.   HPI:  Stephanie Bell is a lovely 79 y.o. female here as a referral from Dr. Kary Kos for thalamic stroke. She has a past medical history of hypertension, hyperlipidemia, no diabetes. She presented to the hospital on February 24 after waking up with right foot and leg  numbness. She also reported weakness. No other neurologic deficits. No aphasia, no dysphagia, no dysarthria, no other weakness or numbness, no altered mentation. Her husband gave her to aspirin may went to the emergency room. She had an MRI of the brain which was positive for stroke.  Sheis feeling much better. Hisband here and provides information as well. No more numbness and no more weakness. She feels scared  that she had a stroke, doesn't understand why or where in the brain she had it. Never had a stroke before. There were no inciting events, she just woke up. No FHx of stroke.  Reviewed notes, labs and imaging from outside physicians, which showed:  ldl 138. hgba1c wnl  Personally reviewed images with patient and her husband and agree with the following:  MRI HEAD FINDINGS  Calvarium and upper cervical spine: No focal marrow signal abnormality.  Orbits: Negative.  Sinuses and Mastoids: Mucosal thickening on the left, overall mild. No effusion.  Brain: Ovoid 1 cm area of restricted diffusion in the lateral left thalamus, extending into the posterior limb internal capsule. No hemorrhagic conversion. No other territory of acute infarct. No evidence of major vessel occlusion.  Elsewhere, microvascular ischemic change is mild/expected for age. No previous infarct. No chronic hemorrhagic foci.  MRA HEAD FINDINGS  Symmetric carotid and vertebral arteries. Intact circle Willis with small anterior communicating artery.  Symmetric vertebrobasilar branching. Symmetric poor signal in distal vertebral and proximal basilar arteries is considered artifactual. These vessels have normal signal on T2 weighted conventional imaging.  Atherosclerotic type narrowings:  Distal left M1 segment high-grade tandem stenoses.  Moderate right proximal M2 segment stenosis.  Bilateral M3 narrowing, potentially overestimated due to artifact (poor signal in the bilateral A2 segments appears artifactual).  Bilateral atherosclerotic irregularity of the proximal PCA.   IMPRESSION: 1. 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 2. No acute arterial finding. 3. Intracranial atherosclerosis as described. No asymmetric disease in the proximal left PCA to correlate with #1.  Ultrasound carotids with no hemodynamically significant stenosis.  Review of Systems: Patient complains  of symptoms per HPI as well as the following symptoms: tremor. Pertinent negatives and positives per HPI. All others negative.   Social History   Socioeconomic History  . Marital status: Married    Spouse name: [name]  . Number of children: 3  . Years of education: College  . Highest education level: Not on file  Occupational History  . Not on file  Tobacco Use  . Smoking status: Never Smoker  . Smokeless tobacco: Never Used  Substance and Sexual Activity  . Alcohol use: Yes    Comment: occasionally has a glass of wine at night   . Drug use: No  . Sexual activity: Yes    Birth control/protection: Post-menopausal  Other Topics Concern  . Not on file  Social History Narrative   Lives with husband.   Caffeine use: occasional coke   Right handed   Enjoys tennis   3 children    no tobacco or drug use   Occasional glass of wine at night    Social Determinants of Health   Financial Resource Strain:   . Difficulty of Paying Living Expenses:   Food Insecurity:   . Worried About Charity fundraiser in the Last Year:   . Arboriculturist in the Last Year:   Transportation Needs:   . Film/video editor (Medical):   Marland Kitchen Lack of Transportation (Non-Medical):   Physical Activity:   .  Days of Exercise per Week:   . Minutes of Exercise per Session:   Stress:   . Feeling of Stress :   Social Connections:   . Frequency of Communication with Friends and Family:   . Frequency of Social Gatherings with Friends and Family:   . Attends Religious Services:   . Active Member of Clubs or Organizations:   . Attends Archivist Meetings:   Marland Kitchen Marital Status:   Intimate Partner Violence:   . Fear of Current or Ex-Partner:   . Emotionally Abused:   Marland Kitchen Physically Abused:   . Sexually Abused:     Family History  Problem Relation Age of Onset  . Ulcers Father   . Alcohol abuse Father   . Alzheimer's disease Mother   . Stroke Mother   . Diabetes Mother   . Bipolar disorder  Mother   . Ulcers Paternal Grandmother   . Colon cancer Neg Hx   . Rectal cancer Neg Hx   . Stomach cancer Neg Hx   . Esophageal cancer Neg Hx     Past Medical History:  Diagnosis Date  . ADD (attention deficit disorder)   . Anemia   . Esophageal stricture 11/04/2014  . GERD (gastroesophageal reflux disease)   . H/O hiatal hernia   . Hx: UTI (urinary tract infection)   . Interstitial cystitis   . Kidney stone   . Lumbago   . Palpitations 02/2017  . Paraesophageal hernia   . Pneumonia 05-09-11   2011 last time  . Stroke ([place])   . Weight loss   . Wound disruption, post-op, skin   . 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 BACK SURGERY  05-09-11   x 2 lumbar  . CHOLECYSTECTOMY  05-09-11   laparoscopic  . COLONOSCOPY  2002, 2008    rectal hyperplastic polyp 2002, diverticulosis 2008  . ESOPHAGOGASTRODUODENOSCOPY    . HERNIA REPAIR  05/16/2011   hiatal hernia  . HIATAL HERNIA REPAIR  02/09/2012   Procedure: LAPAROSCOPIC REPAIR OF HIATAL HERNIA;  Surgeon: Pedro Earls, MD;  Location: [facility];  Service: General;  Laterality: N/A;  . INSERTION OF MESH  02/09/2012   Procedure: INSERTION OF MESH;  Surgeon: Pedro Earls, MD;  Location: [facility];  Service: General;;  . IRRIGATION AND DEBRIDEMENT SEBACEOUS CYST  05/16/2011   Procedure: IRRIGATION AND DEBRIDEMENT SEBACEOUS CYST;  Surgeon: Pedro Earls, MD;  Location: [facility];  Service: General;  Laterality: N/A;  middle of chest on sternum  . LAPAROSCOPIC NISSEN FUNDOPLICATION  0000000   Procedure: LAPAROSCOPIC NISSEN FUNDOPLICATION;  Surgeon: Pedro Earls, MD;  Location: [facility];  Service: General;  Laterality: N/A;  will also remove sebaceous cyst of chest wall  . LAPAROSCOPIC NISSEN FUNDOPLICATION  [id number]   Procedure: LAPAROSCOPIC NISSEN FUNDOPLICATION;  Surgeon: Pedro Earls, MD;  Location: [facility];  Service: General;  Laterality: N/A;  Re-do  Laparoscopic Nissen   . SEPTOPLASTY    . TONSILLECTOMY  05-09-11   Tonsillectomy  . UMBILICAL HERNIA REPAIR  05/16/2011   Procedure: HERNIA REPAIR UMBILICAL ADULT;  Surgeon: Pedro Earls, MD;  Location: [facility];  Service: General;  Laterality: N/A;  with mesh   . UPPER GASTROINTESTINAL ENDOSCOPY    . VAGINAL HYSTERECTOMY      Current Outpatient Medications  Medication Sig Dispense Refill  . aspirin EC 81 MG tablet Take 81 mg by mouth daily.    Marland Kitchen  Calcium Carbonate Antacid (TUMS PO) Take by mouth as needed.    . Cholecalciferol (VITAMIN D3) 5000 UNITS CAPS Take 5,000 Units by mouth once a week.     . Cranberry-Vitamin C 84-20 MG CAPS Take 2 capsules by mouth daily.    . Cyanocobalamin (VITAMIN B-12 PO) Take 1,000 mcg by mouth daily.     Marland Kitchen ibuprofen (ADVIL,MOTRIN) 200 MG tablet Take 200 mg by mouth every 6 (six) hours as needed for headache or mild pain.     Marland Kitchen NABUMETONE PO Take by mouth. Thursday.    . nystatin-triamcinolone ointment (MYCOLOG) Apply 1 application topically 2 (two) times daily. 30 g 2  . ondansetron (ZOFRAN ODT) 4 MG disintegrating tablet Dissolve 1 tablet on the tongue every 6 hours as needed for nausea. 60 tablet 6  . sertraline (ZOLOFT) 25 MG tablet Take 25 mg by mouth daily.    Marland Kitchen trimethoprim (TRIMPEX) 100 MG tablet Take 1 tablet (100 mg total) by mouth daily. 30 tablet 6  . UNABLE TO FIND 4,200 mg. Med Name: Cran Jel    . carbidopa-levodopa (SINEMET IR) 25-100 MG tablet Take 1 tablet by mouth 3 (three) times daily. Take them 4 hours apart for example 10am, 2pm and 6pm. Avoid taking them with protein. 270 tablet 1   No current facility-administered medications for this visit.    Allergies as of 06/30/2019 - Review Complete 06/30/2019  Allergen Reaction Noted  . Benzocaine Palpitations 05/24/2015  . Epinephrine hcl (nasal) Palpitations and Other (See Comments) 02/21/2015  . Omeprazole Diarrhea 03/26/2018  . Sulfa antibiotics Swelling and Rash 02/22/2011     Vitals: BP 132/88 (BP Location: Right Arm, Patient Position: Sitting)   Pulse 90   Temp (!) 97.2 F (36.2 C) Comment: taken at front  Ht 5\' 5"  (1.651 m)   Wt 160 lb (72.6 kg)   BMI 26.63 kg/m  Last Weight:  Wt Readings from Last 1 Encounters:  06/30/19 160 lb (72.6 kg)   Last Height:   Ht Readings from Last 1 Encounters:  06/30/19 5\' 5"  (1.651 m)   Physical exam: Exam: Gen: NAD, conversant, tangential, poor historian           Eyes: Conjunctivae clear without exudates or hemorrhage  Neuro: Detailed Neurologic Exam  Speech:    Speech is normal; fluent and spontaneous with normal comprehension but tngential.  Cognition:    The patient is oriented to person, place, and time; remote and recent history impaired, she does not remember seeing me or that I had diagnosed her with likely parkinson's disease.    Cranial Nerves:    The pupils are equal, round, and reactive to light. Mild masked facies. Visual fields are full to finger confrontation. Extraocular movements are intact. Trigeminal sensation is intact and the muscles of mastication are normal. The face is symmetric. The palate elevates in the midline. Hearing intact. Voice is normal. Shoulder shrug is normal. The tongue has normal motion without fasciculations.   Motor Observation: resting tremor left arm  Gait: shuffling, decreased left arm swing with re-emergent tremor     Tone:    cogwheeling left arm    Posture:    stooped    Strength:    Strength is V/V in the upper and lower limbs.      Sensation: intact to LT  Right upgoing toe, left down, brisk reflexes throughout.  Assessment/Plan:  79 year old here for follow up of tremor, she has not followed up with [place] since 2019.  Patient was seen in the past for thalamic stroke and tremor, we did note she had some mild parkinsonian symptoms at last appointment in 2019, we discussed likely early Parkinson's disease and further testing and a DaTscan at that time she  agreed to the [place] but then changed her mind, since I last saw her in 2019 (she did not keep follow ups with [place]) she has been diagnosed with Parkinson's disease and she has already seen a neurologist last month and was started on treatment.  At this time I do recommend she goes back to Dr. Manuella Ghazi who has diagnosed her and started treating her but she declines she did not like him.  - She declined going to parkinson's Education officer, museum at [employer] neurology - Agreed with her starting Sinemet she can start with 1/2 tab tid as Dr Manuella Ghazi recommended and then 1 tid, 4 hours apart, keep away from protein can eat with crackers if causes nausea - Get regular skin checks, PD associated with skn cancer - Stop phenergan may make symptoms worse - Fall risk, discussed fall precautions, she decline PT - provided much Parkinson's information, local groups, resources - Will need MMSE at next appointment, appears to have poor memory, poor historian, tangential - Will give [place] an hour at next appointment at the end of the day, asked her to bring husband and any family and we can have a family discussion   PRIOR:  This is a lovely 79 year old female with hypertension and hyperlipidemia who presented to [facility] on 02/21/2015 with a nonhemorrhagic lacunar infarct in the left thalamus due to small vessel disease. She was on aspirin at home. ldl 138. hgba1c wnl. MRA did show significant atherosclerotic disease. Discussed with patient and husband and reviewed images.  Lacunar infarct: I had a long d/w patient about her stroke, risk for recurrent stroke/TIAs, personally independently reviewed imaging studies and stroke evaluation results and answered questions.Continue [id number] for secondary stroke prevention and maintain strict control of hypertension with blood pressure goal below 130/90, diabetes with hemoglobin A1c goal below 6.5% and lipids with LDL cholesterol goal below 70 mg/dL. I also advised the patient to eat a  healthy diet with plenty of whole grains, cereals, fruits and vegetables, exercise regularly and maintain ideal body weight .  Patient's LDL is elevated, she declines cholesterol medication, encouraged getting LDL less than 70.  Tremor: She is here for follow-up of resting and action tremor. Exam shows resting course tremor, no action tremor visible or postural tremor. Exam does show some mild stooped posture, and gait with low clearance, narrow gait, small strides but not classically shuffling. May be the onset of parkinson'd disease. Gave patient the option to just observe for now, also DAT scan or medication.  We will continue to follow clinically.  Sarina Ill, MD  [facility] [address] [place],  [address]  Phone [phone] Fax [phone] I spent 50 minutes of face-to-face and non-face-to-face time with patient on the  1. 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[place])    diagnosis.  This included previsit chart review, lab review, study review, order entry, electronic health record documentation, patient education on the different diagnostic and therapeutic options, counseling and coordination of care, risks and benefits of management, compliance, or risk factor reduction

## 2019-06-30 NOTE — Patient Instructions (Addendum)
Start Sinemet as directed Get regular skin checks for skin cancer Stop phenergan as it makes Parksinson's disease worse Follow up 3 months   Parkinson's Disease Parkinson's disease causes problems with movements. It is a long-term condition. It gets worse over time (is progressive). It affects each person in different ways. It makes it harder for you to:  Control how your body moves.  Move your body normally. The condition can range from mild to very bad (advanced). What are the causes? This condition results from a loss of brain cells called neurons. These brain cells make a chemical called dopamine, which is needed to control body movement. As the condition gets worse, the brain cells make less dopamine. This makes it hard to move or control your movements. The exact cause of this condition is not known. What increases the risk?  Being female.  Being age 80 or older.  Having family members who had Parkinson's disease.  Having had an injury to the brain.  Being very sad (depressed).  Being around things that are harmful or poisonous. What are the signs or symptoms? Symptoms of this condition can vary. The main symptoms have to do with movement. These include:  A tremor or shaking while you are resting that you cannot control.  Stiffness in your neck, arms, and legs.  Slowing of movement. This may include: ? Losing expressions of the face. ? Having trouble making small movements that are needed to button your clothing or brush your teeth.  Walking in a way that is not normal. You may walk with short, shuffling steps.  Loss of balance when standing. You may sway, fall backward, or have trouble making turns. Other symptoms include:  Being very sad, worried, or confused.  Seeing or hearing things that are not real.  Losing thinking abilities (dementia).  Trouble speaking or swallowing.  Having a hard time pooping (constipation).  Needing to pee right away, peeing  often, or not being able to control when you pee or poop.  Sleep problems. How is this treated? There is no cure. The goal of treatment is to manage your symptoms. Treatment may include:  Medicines.  Therapy to help with talking or movement.  Surgery to reduce shaking and other movements that you cannot control. Follow these instructions at home: Medicines  Take over-the-counter and prescription medicines only as told by your doctor.  Avoid taking pain or sleeping medicines. Eating and drinking  Follow instructions from your doctor about what you cannot eat or drink.  Do not drink alcohol. Activity  Talk with your doctor about if it is safe for you to drive.  Do exercises as told by your doctor. Lifestyle      Put in grab bars and railings in your home. These help to prevent falls.  Do not use any products that contain nicotine or tobacco, such as cigarettes, e-cigarettes, and chewing tobacco. If you need help quitting, ask your doctor.  Join a support group. General instructions  Talk with your doctor about what you need help with and what your safety needs are.  Keep all follow-up visits as told by your doctor, including any therapy visits to help with talking or moving. This is important. Contact a doctor if:  Medicines do not help your symptoms.  You feel off-balance.  You fall at home.  You need more help at home.  You have trouble swallowing.  You have a very hard time pooping.  You have a lot of side effects from  your medicines.  You feel very sad, worried, or confused. Get help right away if:  You were hurt in a fall.  You see or hear things that are not real.  You cannot swallow without choking.  You have chest pain or trouble breathing.  You do not feel safe at home.  You have thoughts about hurting yourself or others. If you ever feel like you may hurt yourself or others, or have thoughts about taking your own life, get help right  away. You can go to your nearest emergency department or call:  Your local emergency services (911 in the [place]  A suicide crisis helpline, such as the [facility] at 938-163-8779. This is open 24 hours a day. Summary  This condition causes problems with movements.  It is a long-term condition. It gets worse over time.  There is no cure. Treatment focuses on managing your symptoms.  Talk with your doctor about what you need help with and what your safety needs are.  Keep all follow-up visits as told by your doctor. This is important. This information is not intended to replace advice given to you by your health care provider. Make sure you discuss any questions you have with your health care provider. Document Revised: 05/23/2018 Document Reviewed: 05/23/2018 Elsevier Patient Education  [place]; Levodopa tablets What is this medicine? CARBIDOPA;LEVODOPA (kar bi DOE pa; lee voe DOE pa) is used to treat the symptoms of Parkinson's disease. This medicine may be used for other purposes; ask your health care provider or pharmacist if you have questions. COMMON BRAND NAME(S): Atamet, SINEMET What should I tell my health care provider before I take this medicine? They need to know if you have any of these conditions:  depression or other mental illness  diabetes  glaucoma  heart disease, including history of a heart attack  history of irregular heartbeat  kidney disease  liver disease  lung or breathing disease, like asthma  narcolepsy  sleep apnea  stomach or intestine problems  an unusual or allergic reaction to levodopa, carbidopa, other medicines, foods, dyes, or preservatives  pregnant or trying to get pregnant  breast-feeding How should I use this medicine? Take this medicine by mouth with a glass of water. Follow the directions on the prescription label. Take your doses at regular intervals. Do not take your  medicine more often than directed. Do not stop taking except on the advice of your doctor or health care professional. Talk to your pediatrician regarding the use of this medicine in children. Special care may be needed. Overdosage: If you think you have taken too much of this medicine contact a poison control center or emergency room at once. NOTE: This medicine is only for you. Do not share this medicine with others. What if I miss a dose? If you miss a dose, take it as soon as you can. If it is almost time for your next dose, take only that dose. Do not take double or extra doses. What may interact with this medicine? Do not take this medicine with any of the following medications:  MAOIs like Marplan, Nardil, and Parnate  reserpine  tetrabenazine This medicine may also interact with the following medications:  alcohol  droperidol  entacapone  iron supplements or multivitamins with iron  isoniazid, INH  linezolid  medicines for depression, anxiety, or psychotic disturbances  medicines for high blood pressure  medicines for sleep  metoclopramide  papaverine  procarbazine  tedizolid  rasagiline  selegiline  tolcapone This list may not describe all possible interactions. Give your health care provider a list of all the medicines, herbs, non-prescription drugs, or dietary supplements you use. Also tell them if you smoke, drink alcohol, or use illegal drugs. Some items may interact with your medicine. What should I watch for while using this medicine? Visit your health care professional for regular checks on your progress. Tell your health care professional if your symptoms do not start to get better or if they get worse. Do not stop taking except on your health care professional's advice. You may develop a severe reaction. Your health care professional will tell you how much medicine to take. You may experience a wearing of effect prior to the time for your next dose  of this medicine. You may also experience an on-off effect where the medicine apparently stops working for anything from a minute to several hours, then suddenly starts working again. Tell your doctor or health care professional if any of these symptoms happen to you. Your dose may need to be changed. A high protein diet can slow or prevent absorption of this medicine. Avoid high protein foods near the time of taking this medicine to help to prevent these problems. Take this medicine at least 30 minutes before eating or one hour after meals. You may want to eat higher protein foods later in the day or in small amounts. Discuss your diet with your doctor or health care professional or nutritionist. You may get drowsy or dizzy. Do not drive, use machinery, or do anything that needs mental alertness until you know how this drug affects you. Do not stand or sit up quickly, especially if you are an older patient. This reduces the risk of dizzy or fainting spells. Alcohol may interfere with the effect of this medicine. Avoid alcoholic drinks. When taking this medicine, you may fall asleep without notice. You may be doing activities like driving a car, talking, or eating. You may not feel drowsy before it happens. Contact your health care provider right away if this happens to you. There have been reports of increased sexual urges or other strong urges such as gambling while taking this medicine. If you experience any of these while taking this medicine, you should report this to your health care provider as soon as possible. If you are diabetic, this medicine may interfere with the accuracy of some tests for sugar or ketones in the urine (does not interfere with blood tests). Check with your doctor or health care professional before changing the dose of your diabetic medicine. This medicine may discolor the urine or sweat, making it look darker or red in color. This is of no cause for concern. However, this may  stain clothing or fabrics. This medicine may cause a decrease in vitamin B6. You should make sure that you get enough vitamin B6 while you are taking this medicine. Discuss the foods you eat and the vitamins you take with your health care professional. What side effects may I notice from receiving this medicine? Side effects that you should report to your doctor or health care professional as soon as possible:  allergic reactions like skin rash, itching or hives, swelling of the face, lips, or tongue  changes in emotions or moods  falling asleep during normal activities like driving  fast, irregular heartbeat  feeling faint or lightheaded, falls  fever  hallucinations  new or increased gambling urges, sexual urges, uncontrolled spending, binge or compulsive  eating, or other urges  stomach pain  trouble passing urine or change in the amount of urine  uncontrollable movements of the arms, face, head, mouth, neck, or upper body Side effects that usually do not require medical attention (report to your doctor or health care professional if they continue or are bothersome):  dizziness  headache  loss of appetite  nausea  trouble sleeping This list may not describe all possible side effects. Call your doctor for medical advice about side effects. You may report side effects to FDA at 1-800-FDA-1088. Where should I keep my medicine? Keep out of the reach of children. Store at room temperature between 15 and 30 degrees C (59 and 86 degrees F). Protect from light. Throw away any unused medicine after the expiration date. NOTE: This sheet is a summary. It may not cover all possible information. If you have questions about this medicine, talk to your doctor, pharmacist, or health care provider.  2020 Elsevier/Gold Standard (2018-11-11 19:49:59)

## 2019-07-07 ENCOUNTER — Telehealth: Payer: Self-pay | Admitting: Neurology

## 2019-07-07 NOTE — Telephone Encounter (Signed)
I called pt to gather more info. I LVM asking for call back.

## 2019-07-07 NOTE — Telephone Encounter (Signed)
Pt called needing to speak to RN about symptoms she is having with the carbidopa-levodopa (SINEMET IR) 25-100 MG tablet Pt states she is feeling a burning sensation in her stomach.

## 2019-07-07 NOTE — Telephone Encounter (Signed)
Patient returned my call. She said she started the Sinemet last Wednesday. She said a couple mornings when she woke up she noticed she had terrible indigestion/burning sensation in the morning for which she took Di-gel (same as tums) and it helped. She wanted to know if there were any drug-drug interactions. I let her know our source here shows no reaction between di-gel and Sinemet. Pt stated the Sinemet is working very well so far, R arm much less tremor noted already. She has a history of hiatal hernia, indigestion. I let her know if this problem happens again we advise she contact her PCP/surgeon. Pt's questions were answered. She will stay in touch with our office.

## 2019-07-19 DIAGNOSIS — G20A1 Parkinson's disease without dyskinesia, without mention of fluctuations: Secondary | ICD-10-CM

## 2019-07-19 DIAGNOSIS — G20 Parkinson's disease: Secondary | ICD-10-CM

## 2019-07-19 HISTORY — DX: Parkinson's disease: G20

## 2019-07-19 HISTORY — DX: Parkinson's disease without dyskinesia, without mention of fluctuations: G20.A1

## 2019-07-22 DIAGNOSIS — E0590 Thyrotoxicosis, unspecified without thyrotoxic crisis or storm: Secondary | ICD-10-CM | POA: Diagnosis not present

## 2019-07-22 DIAGNOSIS — E559 Vitamin D deficiency, unspecified: Secondary | ICD-10-CM | POA: Diagnosis not present

## 2019-07-22 DIAGNOSIS — E538 Deficiency of other specified B group vitamins: Secondary | ICD-10-CM | POA: Diagnosis not present

## 2019-07-22 DIAGNOSIS — G20 Parkinson's disease: Secondary | ICD-10-CM | POA: Diagnosis not present

## 2019-07-22 DIAGNOSIS — E785 Hyperlipidemia, unspecified: Secondary | ICD-10-CM | POA: Diagnosis not present

## 2019-07-22 DIAGNOSIS — I10 Essential (primary) hypertension: Secondary | ICD-10-CM | POA: Diagnosis not present

## 2019-07-28 DIAGNOSIS — E0590 Thyrotoxicosis, unspecified without thyrotoxic crisis or storm: Secondary | ICD-10-CM | POA: Diagnosis not present

## 2019-07-29 DIAGNOSIS — E039 Hypothyroidism, unspecified: Secondary | ICD-10-CM | POA: Diagnosis not present

## 2019-07-29 DIAGNOSIS — N390 Urinary tract infection, site not specified: Secondary | ICD-10-CM | POA: Diagnosis not present

## 2019-07-29 DIAGNOSIS — G20 Parkinson's disease: Secondary | ICD-10-CM | POA: Diagnosis not present

## 2019-07-29 DIAGNOSIS — E785 Hyperlipidemia, unspecified: Secondary | ICD-10-CM | POA: Diagnosis not present

## 2019-08-07 ENCOUNTER — Telehealth: Payer: Self-pay | Admitting: Physician Assistant

## 2019-08-07 NOTE — Telephone Encounter (Signed)
Pt last saw Dr Carlean Purl for procedure

## 2019-08-07 NOTE — Telephone Encounter (Signed)
Patient notified  Follow up arranged for 6/3

## 2019-08-07 NOTE — Telephone Encounter (Signed)
I suggest trying a Miralax purge and then trying 2 bisacdyl 5mg  each = 10 mg nightly and 2 doses Miralax a day  She can ask neurologist for advice also re: anything different to try instead of Sinement or things they typically recommend  Also would get her in with an APP in 1-2 weeks unless I have an opening  Enema prn a few times a week still ok if needed

## 2019-08-07 NOTE — Telephone Encounter (Signed)
Patient called states she has been constipated for about a month now and can only go if she does a fleet enema.

## 2019-08-07 NOTE — Telephone Encounter (Signed)
Patient reports severe constipation since starting on Sinemet.  She reports always has had constipation, but much worse.  She has tried dulcolax liquid, as well as Miralax.  She has to use an enema to have a BM.  She is only using the enema about once a week.  Please advise.

## 2019-08-08 NOTE — Telephone Encounter (Signed)
Pt states she has not started Miralax purge yet.  She woke up this morning with a burning sensation in her chest and is very concerned.  Would like some guidance on what to do.

## 2019-08-08 NOTE — Telephone Encounter (Signed)
Patient took a zofran this am and is feeling a little better.  She is concerned she might be septic.  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  I reassured her the the nausea and burning in her chest are not symptoms of sepsis and she should contact her primary if she has concerns with the antibiotics she is taking.

## 2019-08-12 ENCOUNTER — Telehealth: Payer: Self-pay | Admitting: Physician Assistant

## 2019-08-12 NOTE — Telephone Encounter (Signed)
Patient called to let you know she has completed the Miralax Purge and would like to know what is the next step for her.

## 2019-08-12 NOTE — Telephone Encounter (Signed)
Left message for patient to call back  

## 2019-08-12 NOTE — Telephone Encounter (Signed)
Patient reports that her stools are now too loose.  She will titrate the miralax and the dulcolax.  She will keep her appt with Carl Best, RNP on 6/2

## 2019-08-20 ENCOUNTER — Telehealth: Payer: Self-pay

## 2019-08-20 MED ORDER — CEPHALEXIN 250 MG PO CAPS: 250.0000 mg | ORAL_CAPSULE | Freq: Every day | ORAL | 3 refills | Status: DC

## 2019-08-20 NOTE — Telephone Encounter (Signed)
[facility] called stating that there is a national back order on Trimethoprim. Is there an alternative we can send in for patient's suppression abx?

## 2019-08-20 NOTE — Telephone Encounter (Signed)
Let us switch her to Keflex 250 mg daily.  I have sent a prescription to [facility] that should cover her until her next follow-up appointment with Dr. Bernardo Heater later this year.

## 2019-08-20 NOTE — Telephone Encounter (Signed)
Patient notified

## 2019-08-21 ENCOUNTER — Encounter: Payer: Self-pay | Admitting: Nurse Practitioner

## 2019-08-21 ENCOUNTER — Ambulatory Visit: Payer: PPO | Admitting: Nurse Practitioner

## 2019-08-21 VITALS — BP 124/72 | HR 65 | Ht 65.0 in | Wt 155.0 lb

## 2019-08-21 DIAGNOSIS — G20 Parkinson's disease: Secondary | ICD-10-CM

## 2019-08-21 DIAGNOSIS — K5909 Other constipation: Secondary | ICD-10-CM | POA: Diagnosis not present

## 2019-08-21 NOTE — Patient Instructions (Addendum)
If you are age 80 or older, your body mass index should be between 23-30. Your Body mass index is 25.79 kg/m. If this is out of the aforementioned range listed, please consider follow up with your Primary Care Provider.  If you are age 60 or younger, your body mass index should be between 19-25. Your Body mass index is 25.79 kg/m. If this is out of the aformentioned range listed, please consider follow up with your Primary Care Provider.   START Miralax 1 capful in 8 ounces of water or juice nightly. START Dulcolax 5 mg (or generic) 1 tablet every 3rd night.  Call the office symptoms continue or worsen.   Call The office to schedule a follow up.

## 2019-08-21 NOTE — Progress Notes (Signed)
08/21/2019 TAUHEEDAH BOK [id number] November 18, 1940   Chief Complaint: Constipation   History of Present Illness:  Past medical history of GERD, esophageal stricture, Zenker's diverticulum, s/p Nissen fundoplication in 2683 with revision 5 months later.  She was  recently diagnosed with Parkinson's disease and she was prescribed carbidopa levodopa which she started approximately 4 weeks ago.  Since taking the carbidopa levodopa she developed worsening constipation.  She reported passing a small bowel movement once every 1 to 2 weeks.  She contacted Dr. Carlean Purl on 08/07/2018 and she was prescribed to take a MiraLAX purge.  She stated taking 3 doses of MiraLAX on 5/23 and one 5 mg Dulcolax tablet and she completely cleaned out.  She then seemed confused to and stated she did not respond to the MiraLAX and Dulcolax.  Since that time, she is passing mostly loose stool with an occasional solid stool.  She is taking MiraLAX and ordered Duca locks once most days at different times.  Her bowel pattern is all over the place.  She recorded her bowel pattern and laxative use as follows below:  5/26 am she took Miralax and 1 Dulcolax tab, no BM  5/27 small BM loose/watery and 7:30pm large loose BM.    5/28 one dose miralax, loose BM at 12 pm, 2nd BM was solid  5/29 Miralax at noon, BM normal  5/30 2 BMs, no Miralax, took one dulcolax 6pm  5/31 Mon small Bm normal took one dulcolax tab   6/1 10 am large loose BM  6/2 3am Wed took Dulcolax  6/3 8:35 am took Miralax at 9:45 had  large loose BM.   Today day large loose BM at 9:45am. No MiraLAX or Dulcolax  Labs 07/22/2019 from [facility]: WBC 5.7.  Hemoglobin 14.2.  Hematocrit 42.0.  Platelet 123.  Leukos 92.  Sodium 140.  Potassium 3.9.  BUN 10.  Creatinine 0.9.  Calcium 9.4.  AST 12.  ALT 10.  Alk phos 71.  Albumin 4.2.  Total bili 0.8.  TSH 0.239.   Her most recent colonoscopy was 10/16/2012 by Dr. Carlean Purl which identified  nonbleeding small AVMs in the cecum, internal and external hemorrhoids.  No polyps.  Current Outpatient Medications on File Prior to Visit  Medication Sig Dispense Refill  . aspirin EC 81 MG tablet Take 81 mg by mouth daily.    . Calcium Carbonate Antacid (TUMS PO) Take by mouth as needed.    . Cholecalciferol (VITAMIN D3) 5000 UNITS CAPS Take 5,000 Units by mouth once a week.     . Cranberry-Vitamin C 84-20 MG CAPS Take 2 capsules by mouth daily.    . Cyanocobalamin (VITAMIN B-12 PO) Take 1,000 mcg by mouth daily.     . ondansetron (ZOFRAN ODT) 4 MG disintegrating tablet Dissolve 1 tablet on the tongue every 6 hours as needed for nausea. 60 tablet 6  . ondansetron (ZOFRAN) 4 MG tablet Take 4 mg by mouth every 8 (eight) hours as needed for nausea or vomiting.    . sertraline (ZOLOFT) 25 MG tablet Take 25 mg by mouth daily.    Marland Kitchen trimethoprim (TRIMPEX) 100 MG tablet Take 100 mg by mouth daily.     No current facility-administered medications on file prior to visit.    Allergies  Allergen Reactions  . Benzocaine Palpitations    Specific agent was Scandonest 2 %  . Epinephrine Hcl (Nasal) Palpitations and Other (See Comments)    Reaction:  Tachycardia   .  Omeprazole Diarrhea  . Sulfa Antibiotics Swelling and Rash    Current Medications, Allergies, Past Medical History, Past Surgical History, Family History and Social History were reviewed in [employer] record.  Physical Exam: BP 124/72   Pulse 65   Ht '5\' 5"'  (1.651 m)   Wt 155 lb (70.3 kg)   BMI 25.79 kg/m  General: Well developed 79 year old female in no acute distress. Head: Normocephalic and atraumatic. Eyes: No scleral icterus. Conjunctiva pink . Ears: Normal auditory acuity. Mouth: Dentition intact. No ulcers or lesions.  Lungs: Clear throughout to auscultation. Heart: Regular rate and rhythm, no murmur. Abdomen: Soft, nontender and nondistended. No masses or hepatomegaly. Normal bowel sounds x 4  quadrants.  Rectal: No fecal impaction. Soft brown smear of stool in rectal vault guaiac negative.  Musculoskeletal: Symmetrical with no gross deformities. Extremities: No edema. Neurological: Alert oriented x 4. No focal deficits.  Psychological: Alert and cooperative. Normal mood and affect  Assessment and Recommendations:  55.  79 year old female chronic constipation exacerbated after taking carbidopa-levodopa for Parkinson's disease -I advised the patient to be consistent with her bowel regimen as follows.  MiraLAX 1 capful mixed in 8 ounces of water at bedtime.  Duca locks 5 mg 1 p.o. nightly every third night as needed.  If she develops nighttime bowel movements on this regimen she may take the MiraLAX and Duca locks in the morning if she prefers. -Patient to contact me in 1 to 2 weeks with an update -Follow-up in the office in 3 months  2.  History of GERD, esophageal stricture, Zenker's diverticulum, status post Nissen fundoplication with revision in 2013 no longer taking PPI or H2 blocker.  No current GERD symptoms.  3. Thrombocytopenia of unclear etiology.  PLT 135 -> 146-> 123. No evidence of liver disease.

## 2019-08-27 ENCOUNTER — Telehealth: Payer: Self-pay | Admitting: Nurse Practitioner

## 2019-08-27 NOTE — Telephone Encounter (Signed)
Pt states she had an esophageal dilation about 3 mths ago. Pt reports she is having the sensation that what she is swallowing is not going down, feels like cotton stuck in her throat. She has been able to take a pill this morning and get liquids down. Pt very anxious. Please advise.

## 2019-08-27 NOTE — Telephone Encounter (Signed)
Spoke to her  Advised she advance diet and see what happens

## 2019-08-27 NOTE — Telephone Encounter (Signed)
Noted  

## 2019-09-04 ENCOUNTER — Other Ambulatory Visit: Payer: Self-pay | Admitting: Podiatry

## 2019-09-04 ENCOUNTER — Ambulatory Visit (INDEPENDENT_AMBULATORY_CARE_PROVIDER_SITE_OTHER): Payer: PPO

## 2019-09-04 ENCOUNTER — Ambulatory Visit: Payer: PPO | Admitting: Podiatry

## 2019-09-04 ENCOUNTER — Other Ambulatory Visit: Payer: Self-pay

## 2019-09-04 DIAGNOSIS — M722 Plantar fascial fibromatosis: Secondary | ICD-10-CM

## 2019-09-04 DIAGNOSIS — M79672 Pain in left foot: Secondary | ICD-10-CM

## 2019-09-04 DIAGNOSIS — Q828 Other specified congenital malformations of skin: Secondary | ICD-10-CM | POA: Diagnosis not present

## 2019-09-05 NOTE — Progress Notes (Signed)
Subjective:   Patient ID: Stephanie Bell, female   DOB: 79 y.o.   MRN: [id number]   HPI Patient states she is getting a lot of pain underneath her left foot and seems to have fluid and inflammation associated with it along with a lesion and not sure if she may have stepped on something   ROS      Objective:  Physical Exam  Neurovascular status intact no change health history with inflammation noted of the plantar heel left with fluid buildup and I also noted the lesion center of the heel painful with pressure that is been present for several months     Assessment:  Acute plantar fasciitis of the left heel along with probability of porokeratotic lesion with slight possibility for foreign body     Plan:  H&P reviewed conditions and today went ahead did sterile prep and injected the plantar fascial left 3 mg Kenalog 5 mg Xylocaine and did sharp sterile debridement of lesion with padding to reduce pressure.  Explained reduced activity and if symptoms persist or get worse I want to see patient back

## 2019-10-02 ENCOUNTER — Ambulatory Visit: Payer: PPO | Admitting: Neurology

## 2019-10-02 ENCOUNTER — Encounter: Payer: Self-pay | Admitting: Neurology

## 2019-10-02 VITALS — BP 123/78 | HR 67 | Ht 65.0 in | Wt 157.4 lb

## 2019-10-02 DIAGNOSIS — R269 Unspecified abnormalities of gait and mobility: Secondary | ICD-10-CM

## 2019-10-02 DIAGNOSIS — G20 Parkinson's disease: Secondary | ICD-10-CM | POA: Diagnosis not present

## 2019-10-02 NOTE — Progress Notes (Signed)
[facility]    Provider:  Dr Jaynee Eagles Referring Provider: [place] Pink, MD Primary Care Physician:  [place] Pink, MD  CC:  parkinson's disease  10/02/2019: Patient is here with her husband, this is the first time I have met her husband, we revieiwed patient's workup, history, I answered any questions and we discussed the diagnosis of parkinson's disease and I provided much information for patient and husband, we discussed exercise and reviewed lots of exercise options for PD and how important that is, patient states Feels like the Sinemet is really helping with tremor, no tremors since taking it, she feels her walking is k, she still uses a cane. She went to a urologist a few months ago and she was having constipation problems, she was given miralax for 3 days and then a dulcolax. She is taking 1/2 the dose of miralax and since then she has been fine without miralax. She had a hiatal hernia and she had surgery and did great but coughed and it came loose and then she had the surgery again, she had an episode last May and she had a dry tablet and was eating breakfast and it was coming back up but this is not new, she threw up and no more incidents since then she tries to make sure she doesn't eat anything dry, she hashad to learn but this week she had some sticking in her throat with a dry tablet again and then she had some sticking and she spit up a little bit, she has slowed down and it is better and pill crusher helps. She had a swallow test earlier this year, and she has had a swallow test and follows with her GI doctor regularly and is monitoring, no falls, husband says he doesn't let her carry the groceries and he laughs and says he doesn't make her mow the lawn either and laughs. Husband has placed some bars around the house for safety and she now has a vaccuuming robot. She will use a cane or a rolling walker.  She does not have sleep apnea, she does not snore, husband doesn't  seem to feel she is very active in her sleep but she does have vivid dreams and had them for years but appears to be better the last several years. She is on daily medication now for UTIs for 6 months Trimethorprin and following for that with urology. No dyskinesias.   Interval history June 30, 2019: Patient was seen in the past for thalamic stroke and tremor, we did note she had some mild parkinsonian symptoms at last appointment in 2019(she never followed up with [place]), we discussed further testing and a DaTscan at that time she agreed to the [place] but then changed her mind and declined, since I last saw her in 2019 she has been diagnosed with Parkinson's disease and she has already seen a neurologist.  She saw Dr. 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  MRI of the brain was ordered as well as vitamin B12 and vitamin D, she was started on carbidopa levodopa 3 times a day, MiraLAX Dulcolax and enema for constipation, she was talked to at length about her UTI, patient has a long history of UTIs.  Patient is here alone, As mentioned above, we saw her 2 years ago and  recommend DatScan at that time which she declined but we suspected early parkinson's disease. In the 2 years(she never followed up) her symptoms have progressed and she is on sinemet currently see by another neurologist. She has left resting tremor, worsening, she had one fall but she denies falling more often, she is walking slowly, she reports vivid dreams. She doesn't feel her memory is as good (she doesn't remember meeting me in the past or [place] talking about her parkinson's symptoms). She has a son who is POA he lives in [place], other son lives in [place], daughter has done a lot of  research on parkinson's disease she she has lots of support.   Patient complains of symptoms per HPI as well as the following symptoms: tremors . Pertinent negatives and positives per HPI. All others negative   Interval history 04/30/2017: Patient is here for follow-up of tremor.  Her last appointment we discussed that she had some mild parkinsonian symptoms.  Discussed the differential which includes Parkinson's disease.  The options were to start medication, follow clinically, or perform a DaTscan.  At that time she wanted to perform the DaTscan but she changed her mind.  Today we discussed parkinsonism in all the different reasons.  She continues to have tremors, resting and action.  Discussed Parkinson's disease, also essential tremor.  Discussed medication management and Parkinson's disease including dopamine, dopamine agonists and others.  At this time she decided that she like to follow clinically which I agree with.  We will see her back in 6 months.  Otherwise she is doing excellent, she is playing tennis.   Interval history 12/25/2016; She has noticed tremor in her left hand noticed it first in may when driving and more recently when holding something heavy, she notices it at rest as well. She has some stress in her life. Happens every day, No caffeine. She is on aspirin. No tremor in the family. No FHx of Parkinson's. No slowing, no shuffling, no falls so far. No hallucinations or delusions. She reports poor memory. She uses a walker at home, she says she has slowed down.   HPI:  Stephanie Bell is a lovely 79 y.o. female here as a referral from Dr. Kary Kos for thalamic stroke. She has a past medical history of hypertension, hyperlipidemia, no diabetes. She presented to the hospital on February 24 after waking up with right foot and leg numbness. She also reported weakness. No other neurologic deficits. No aphasia, no dysphagia, no dysarthria, no other weakness or numbness, no altered mentation.  Her husband gave her to aspirin may went to the emergency room. She had an MRI of the brain which was positive for stroke.  Sheis feeling much better. Hisband here and provides information as well. No more numbness and no more weakness. She feels scared that she had a stroke, doesn't understand why or where in the brain she had it. Never had a stroke before. There were no inciting events, she just woke up. No FHx of stroke.  Reviewed notes, labs and imaging from outside physicians, which showed:  ldl 138. hgba1c wnl  Personally reviewed images with patient and her husband and agree with the following:  MRI HEAD FINDINGS  Calvarium and upper cervical spine: No focal marrow signal abnormality.  Orbits: Negative.  Sinuses and Mastoids: Mucosal thickening on the left, overall mild. No effusion.  Brain: Ovoid 1 cm area of restricted diffusion in the lateral left thalamus, extending into the posterior limb internal capsule. No hemorrhagic  conversion. No other territory of acute infarct. No evidence of major vessel occlusion.  Elsewhere, microvascular ischemic change is mild/expected for age. No previous infarct. No chronic hemorrhagic foci.  MRA HEAD FINDINGS  Symmetric carotid and vertebral arteries. Intact circle Willis with small anterior communicating artery.  Symmetric vertebrobasilar branching. Symmetric poor signal in distal vertebral and proximal basilar arteries is considered artifactual. These vessels have normal signal on T2 weighted conventional imaging.  Atherosclerotic type narrowings:  Distal left M1 segment high-grade tandem stenoses.  Moderate right proximal M2 segment stenosis.  Bilateral M3 narrowing, potentially overestimated due to artifact (poor signal in the bilateral A2 segments appears artifactual).  Bilateral atherosclerotic irregularity of the proximal PCA.   IMPRESSION: 1. 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 2. No  acute arterial finding. 3. Intracranial atherosclerosis as described. No asymmetric disease in the proximal left PCA to correlate with #1.  Ultrasound carotids with no hemodynamically significant stenosis.  Review of Systems: Patient complains of symptoms per HPI as well as the following symptoms: tremor. Pertinent negatives and positives per HPI. All others negative.   Social History   Socioeconomic History  . Marital status: Married    Spouse name: [name]  . Number of children: 3  . Years of education: College  . Highest education level: Not on file  Occupational History  . Not on file  Tobacco Use  . Smoking status: Never Smoker  . Smokeless tobacco: Never Used  Vaping Use  . Vaping Use: Never used  Substance and Sexual Activity  . Alcohol use: Yes    Comment: occasionally has a glass of wine at night   . Drug use: No  . Sexual activity: Yes    Birth control/protection: Post-menopausal  Other Topics Concern  . Not on file  Social History Narrative   Lives with husband.   Caffeine use: occasional coke   Right handed   Enjoys tennis   3 children    no tobacco or drug use   Occasional glass of wine at night    Social Determinants of Health   Financial Resource Strain:   . Difficulty of Paying Living Expenses:   Food Insecurity:   . Worried About Charity fundraiser in the Last Year:   . Arboriculturist in the Last Year:   Transportation Needs:   . Film/video editor (Medical):   Marland Kitchen Lack of Transportation (Non-Medical):   Physical Activity:   . Days of Exercise per Week:   . Minutes of Exercise per Session:   Stress:   . Feeling of Stress :   Social Connections:   . Frequency of Communication with Friends and Family:   . Frequency of Social Gatherings with Friends and Family:   . Attends Religious Services:   . Active Member of Clubs or Organizations:   . Attends Archivist Meetings:   Marland Kitchen Marital Status:   Intimate Partner Violence:   . Fear  of Current or Ex-Partner:   . Emotionally Abused:   Marland Kitchen Physically Abused:   . Sexually Abused:     Family History  Problem Relation Age of Onset  . Ulcers Father   . Alcohol abuse Father   . Alzheimer's disease Mother   . Stroke Mother   . Diabetes Mother   . Bipolar disorder Mother   . Ulcers Paternal Grandmother   . Colon cancer Neg Hx   . Rectal cancer Neg Hx   . Stomach cancer Neg Hx   .  Esophageal cancer Neg Hx     Past Medical History:  Diagnosis Date  . ADD (attention deficit disorder)   . Anemia   . Esophageal stricture 11/04/2014  . GERD (gastroesophageal reflux disease)   . H/O hiatal hernia   . Hx: UTI (urinary tract infection)   . Interstitial cystitis   . Kidney stone   . Lumbago   . Palpitations 02/2017  . Paraesophageal hernia   . Parkinson's disease ([place]) 07/2019  . Pneumonia 05-09-11   2011 last time  . Stroke ([place])   . Weight loss   . Wound disruption, post-op, skin   . 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 BACK SURGERY  05-09-11   x 2 lumbar  . CHOLECYSTECTOMY  05-09-11   laparoscopic  . COLONOSCOPY  2002, 2008    rectal hyperplastic polyp 2002, diverticulosis 2008  . ESOPHAGOGASTRODUODENOSCOPY    . HERNIA REPAIR  05/16/2011   hiatal hernia  . HIATAL HERNIA REPAIR  02/09/2012   Procedure: LAPAROSCOPIC REPAIR OF HIATAL HERNIA;  Surgeon: Pedro Earls, MD;  Location: [facility];  Service: General;  Laterality: N/A;  . INSERTION OF MESH  02/09/2012   Procedure: INSERTION OF MESH;  Surgeon: Pedro Earls, MD;  Location: [facility];  Service: General;;  . IRRIGATION AND DEBRIDEMENT SEBACEOUS CYST  05/16/2011   Procedure: IRRIGATION AND DEBRIDEMENT SEBACEOUS CYST;  Surgeon: Pedro Earls, MD;  Location: [facility];  Service: General;  Laterality: N/A;  middle of chest on sternum  . LAPAROSCOPIC NISSEN FUNDOPLICATION  11/21/90   Procedure: LAPAROSCOPIC NISSEN FUNDOPLICATION;  Surgeon:  Pedro Earls, MD;  Location: [facility];  Service: General;  Laterality: N/A;  will also remove sebaceous cyst of chest wall  . LAPAROSCOPIC NISSEN FUNDOPLICATION  33/00/7622   Procedure: LAPAROSCOPIC NISSEN FUNDOPLICATION;  Surgeon: Pedro Earls, MD;  Location: [facility];  Service: General;  Laterality: N/A;  Re-do Laparoscopic Nissen   . SEPTOPLASTY    . TONSILLECTOMY  05-09-11   Tonsillectomy  . UMBILICAL HERNIA REPAIR  05/16/2011   Procedure: HERNIA REPAIR UMBILICAL ADULT;  Surgeon: Pedro Earls, MD;  Location: [facility];  Service: General;  Laterality: N/A;  with mesh   . UPPER GASTROINTESTINAL ENDOSCOPY    . VAGINAL HYSTERECTOMY      Current Outpatient Medications  Medication Sig Dispense Refill  . aspirin EC 81 MG tablet Take 81 mg by mouth daily.    . Calcium Carbonate Antacid (TUMS PO) Take by mouth as needed.    . carbidopa-levodopa (SINEMET IR) 25-100 MG tablet Take 1 tablet by mouth 3 (three) times daily. 1 tab 4 hours apart, 3 x daily 270 tablet 3  . Cholecalciferol (VITAMIN D3) 5000 UNITS CAPS Take 5,000 Units by mouth once a week.     . Cranberry-Vitamin C 84-20 MG CAPS Take 2 capsules by mouth daily.    . Cyanocobalamin (VITAMIN B-12 PO) Take 1,000 mcg by mouth daily.     . Multiple Vitamin (MULTI-VITAMIN) tablet Take 1 tablet by mouth daily.    . ondansetron (ZOFRAN ODT) 4 MG disintegrating tablet Dissolve 1 tablet on the tongue every 6 hours as needed for nausea. 60 tablet 6  . ondansetron (ZOFRAN) 4 MG tablet Take 4 mg by mouth every 8 (eight) hours as needed for nausea or vomiting.    . sertraline (ZOLOFT) 25 MG tablet Take 25 mg by mouth daily.    Marland Kitchen trimethoprim (TRIMPEX) 100  MG tablet Take 100 mg by mouth daily.     No current facility-administered medications for this visit.    Allergies as of 10/02/2019 - Review Complete 10/02/2019  Allergen Reaction Noted  . Benzocaine Palpitations 05/24/2015  . Epinephrine hcl (nasal) Palpitations and Other (See Comments)  02/21/2015  . Omeprazole Diarrhea 03/26/2018  . Sulfa antibiotics Swelling and Rash 02/22/2011    Vitals: BP 123/78   Pulse 67   Ht '5\' 5"'  (1.651 m)   Wt 157 lb 6.4 oz (71.4 kg)   BMI 26.19 kg/m  Last Weight:  Wt Readings from Last 1 Encounters:  10/02/19 157 lb 6.4 oz (71.4 kg)   Last Height:   Ht Readings from Last 1 Encounters:  10/02/19 '5\' 5"'  (1.651 m)   Physical exam: Exam: Gen: NAD, conversant, tangential, poor historian           Eyes: Conjunctivae clear without exudates or hemorrhage  Neuro: Detailed Neurologic Exam  Speech:    Speech is normal; fluent and spontaneous with slightly impaired comprehension but tangential, poor concentration.  Cognition:    The patient is oriented to person, place, and time; remote and recent history impaired.    Cranial Nerves:    The pupils are equal, round, and reactive to light. Mild masked facies. Visual fields are full to finger confrontation. Extraocular movements are intact. Trigeminal sensation is intact and the muscles of mastication are normal. The face is symmetric. The palate elevates in the midline. Hearing intact. Voice is normal. Shoulder shrug is normal. The tongue has normal motion without fasciculations.   Motor Observation: resting tremor left arm which is improved on Sinemet  Gait: shuffling, decreased left arm swing with re-emergent tremor improved today     Tone:    cogwheeling left arm , improved  Posture:    Stooped slightly    Strength:    Strength is V/V in the upper and lower limbs.      Sensation: intact to LT  Right upgoing toe, left down, brisk reflexes throughout.  Assessment/Plan:  79 year old here for follow up of Parkinson;s disease here with her husband, extended visit for family and patient education and answering of all questions, reviewing history. Patient was seen in the past for thalamic stroke and tremor, we did note she had some mild parkinsonian symptoms  in 2019, we discussed likely  early Parkinson's disease and further testing and a DaTscan at that time she agreed to the [place] but then changed her mind, since I last saw her in 2019 she progressed and now she is on Sinemet and reports improvement.  - She declined going to parkinson's Education officer, museum at [employer] neurology, discussed again with husband - Continue Sinemet 1 tid, 4 hours apart, keep away from protein can eat with crackers if causes nausea - Get regular skin checks, PD associated with skn cancer, discussed again - Stop phenergan may make symptoms worse - Fall risk, discussed fall precautions - provided much Parkinson's information, local groups, resources which we reviewed extensively and I answered all questions, I reviewed classes focused on PD and exercise, support groups, I encouraged they attend, exercise can slow down PD progression - Physical Therapy: For Parkinson's disease, gait abnormality - Will need MMSE at next appointment, appears to have poor memory, poor historian, tangential - she declined at this visit, will try again next visit and maybe discuss Ex[place] - Had family meeting with husband today  PRIOR:  This is a lovely 79 year old female with hypertension  and hyperlipidemia who presented to [facility] on 02/21/2015 with a nonhemorrhagic lacunar infarct in the left thalamus due to small vessel disease. She was on aspirin at home. ldl 138. hgba1c wnl. MRA did show significant atherosclerotic disease. Discussed with patient and husband and reviewed images.  Lacunar infarct: I had a long d/w patient about her stroke, risk for recurrent stroke/TIAs, personally independently reviewed imaging studies and stroke evaluation results and answered questions.Continue [id number] for secondary stroke prevention and maintain strict control of hypertension with blood pressure goal below 130/90, diabetes with hemoglobin A1c goal below 6.5% and lipids with LDL cholesterol goal below 70 mg/dL. I also advised the  patient to eat a healthy diet with plenty of whole grains, cereals, fruits and vegetables, exercise regularly and maintain ideal body weight .  Patient's LDL is elevated, she declines cholesterol medication, encouraged getting LDL less than 70.  Tremor: She is here for follow-up of resting and action tremor. Exam shows resting course tremor, no action tremor visible or postural tremor. Exam does show some mild stooped posture, and gait with low clearance, narrow gait, small strides but not classically shuffling. May be the onset of parkinson'd disease. Gave patient the option to just observe for now, also DAT scan or medication.  We will continue to follow clinically.  Sarina Ill, MD  [facility] [address] [place], [place] [address]  I spent 75 minutes of face-to-face and non-face-to-face time with patient on the  1. 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[place])   2. Gait abnormality    diagnosis.  This included previsit chart review, lab review, study review, order entry, electronic health record documentation, patient education on the different diagnostic and therapeutic options, counseling and coordination of care, risks and benefits of management, compliance, or risk factor reduction

## 2019-10-02 NOTE — Patient Instructions (Addendum)
For constipation: try the "prune juice cocktail". Mix 1/2 cup applesauce, 2 tablespoons of Bran(also called miller's bran), 4-6 oz. prune juice. Store in Multimedia programmer. Take a tablespoonful per day at first, gradually increasing until you find the amount that works best. Most people find this mixture tasty.  Continue current medications   Parkinson's Disease Parkinson's disease causes problems with movements. It is a long-term condition. It gets worse over time (is progressive). It affects each person in different ways. It makes it harder for you to:  Control how your body moves.  Move your body normally. The condition can range from mild to very bad (advanced). What are the causes? This condition results from a loss of brain cells called neurons. These brain cells make a chemical called dopamine, which is needed to control body movement. As the condition gets worse, the brain cells make less dopamine. This makes it hard to move or control your movements. The exact cause of this condition is not known. What increases the risk?  Being female.  Being age 80 or older.  Having family members who had Parkinson's disease.  Having had an injury to the brain.  Being very sad (depressed).  Being around things that are harmful or poisonous. What are the signs or symptoms? Symptoms of this condition can vary. The main symptoms have to do with movement. These include:  A tremor or shaking while you are resting that you cannot control.  Stiffness in your neck, arms, and legs.  Slowing of movement. This may include: ? Losing expressions of the face. ? Having trouble making small movements that are needed to button your clothing or brush your teeth.  Walking in a way that is not normal. You may walk with short, shuffling steps.  Loss of balance when standing. You may sway, fall backward, or have trouble making turns. Other symptoms include:  Being very sad, worried, or confused.  Seeing  or hearing things that are not real.  Losing thinking abilities (dementia).  Trouble speaking or swallowing.  Having a hard time pooping (constipation).  Needing to pee right away, peeing often, or not being able to control when you pee or poop.  Sleep problems. How is this treated? There is no cure. The goal of treatment is to manage your symptoms. Treatment may include:  Medicines.  Therapy to help with talking or movement.  Surgery to reduce shaking and other movements that you cannot control. Follow these instructions at home: Medicines  Take over-the-counter and prescription medicines only as told by your doctor.  Avoid taking pain or sleeping medicines. Eating and drinking  Follow instructions from your doctor about what you cannot eat or drink.  Do not drink alcohol. Activity  Talk with your doctor about if it is safe for you to drive.  Do exercises as told by your doctor. Lifestyle      Put in grab bars and railings in your home. These help to prevent falls.  Do not use any products that contain nicotine or tobacco, such as cigarettes, e-cigarettes, and chewing tobacco. If you need help quitting, ask your doctor.  Join a support group. General instructions  Talk with your doctor about what you need help with and what your safety needs are.  Keep all follow-up visits as told by your doctor, including any therapy visits to help with talking or moving. This is important. Contact a doctor if:  Medicines do not help your symptoms.  You feel off-balance.  You fall at home.  You need more help at home.  You have trouble swallowing.  You have a very hard time pooping.  You have a lot of side effects from your medicines.  You feel very sad, worried, or confused. Get help right away if:  You were hurt in a fall.  You see or hear things that are not real.  You cannot swallow without choking.  You have chest pain or trouble breathing.  You do  not feel safe at home.  You have thoughts about hurting yourself or others. If you ever feel like you may hurt yourself or others, or have thoughts about taking your own life, get help right away. You can go to your nearest emergency department or call:  Your local emergency services (911 in the [place]  A suicide crisis helpline, such as the [facility] at 539-241-1924. This is open 24 hours a day. Summary  This condition causes problems with movements.  It is a long-term condition. It gets worse over time.  There is no cure. Treatment focuses on managing your symptoms.  Talk with your doctor about what you need help with and what your safety needs are.  Keep all follow-up visits as told by your doctor. This is important. This information is not intended to replace advice given to you by your health care provider. Make sure you discuss any questions you have with your health care provider. Document Revised: 05/23/2018 Document Reviewed: 05/23/2018 Elsevier Patient Education  [place].

## 2019-10-05 ENCOUNTER — Encounter: Payer: Self-pay | Admitting: Neurology

## 2019-10-05 MED ORDER — CARBIDOPA-LEVODOPA 25-100 MG PO TABS: 1.0000 | ORAL_TABLET | Freq: Three times a day (TID) | ORAL | 3 refills | Status: DC

## 2019-11-20 ENCOUNTER — Telehealth: Payer: Self-pay | Admitting: Interventional Cardiology

## 2019-11-20 NOTE — Telephone Encounter (Signed)
Pt calling today to reports periods of orthostatic hypotension. This occurs mainly when she is getting out of bed and while having bowel movements. She recently was dx with Parkinson's and started on Sinemet in July. She states these sx began around the time she began Sinemet.   I discussed with her that orthostatic hypotension can be a side effect of Sinemet and advised her to speak with Dr. Cathren Laine office regarding her dose or any recommendation as to how to manage it. In the meantime, she was encouraged to increase her water and sodium intake especially in the warm months. She was educated on taking her time in changing positions before moving.   She also confirms frequent constipation. She states she was taking miralax 3x/day per her PCP's recommendation, but stopped recently when she went on vacation with fear of having an accident. I advised her to contact her PCP to discuss management of this as well since this may be contributing to her sx as well. She understands increasing her fluid intake may help relieve this as well.   I will forward to Dr. Tamala Julian and his RN for further review. Pt agrees to contact her neurologist to discuss.

## 2019-11-20 NOTE — Telephone Encounter (Signed)
Add salt to food. Hydrate more than usual. Wear knee high moderate tension compression stockings.

## 2019-11-20 NOTE — Telephone Encounter (Signed)
Pt c/o BP issue: STAT if pt c/o blurred vision, one-sided weakness or slurred speech  1. What are your last 5 BP readings?  09/02 5:00 AM 121/83(sitting) 87/62(standing)  2. Are you having any other symptoms (ex. Dizziness, headache, blurred vision, passed out)? Lightheadedness to the point of feeling she will pass out   3. What is your BP issue? Stephanie Bell is calling stating her BP is dropping every time she stands up, as well as every time she tries to make a bowel movement. She states in the past 6 weeks she has been diagnosed with parkinson's disease and she has noticed it progressively getting worse since then. Please advise.

## 2019-11-21 NOTE — Telephone Encounter (Signed)
Spoke with pt and made her aware of recommendations per Dr. Tamala Julian.  Pt verbalized understanding and was appreciative for call.

## 2019-11-26 ENCOUNTER — Telehealth: Payer: Self-pay | Admitting: Neurology

## 2019-11-26 DIAGNOSIS — I951 Orthostatic hypotension: Secondary | ICD-10-CM | POA: Diagnosis not present

## 2019-11-26 DIAGNOSIS — G20 Parkinson's disease: Secondary | ICD-10-CM | POA: Diagnosis not present

## 2019-11-26 NOTE — Telephone Encounter (Signed)
Stephanie Bell, Sinemet can make orthostatic hypotension worse. I would have her stop the Sinemet too.

## 2019-11-26 NOTE — Telephone Encounter (Signed)
Ok, please call again again tomorrow to make sure she got the message

## 2019-11-26 NOTE — Telephone Encounter (Signed)
I called the pt back and LVM (ok per DPR) advising to call her primary care or cardiologist right away to discuss and see if she can make an appointment to have this evaluated. Advised pt to be sure she is hydrating well and to be extremely careful standing, hold onto something, and sit for a few minutes before standing up. I advised the pt that we do not treat this condition so she would need to see her PCP or cardiology for full evaluation and advice/treatment. I left our office number for call back if needed.

## 2019-11-26 NOTE — Telephone Encounter (Signed)
Pt called back, informed Pt of the voicemail. Pt did not want to speak with a nurse.

## 2019-11-26 NOTE — Telephone Encounter (Signed)
Pt is asking for a call to discuss her concerns with her blood pressure readings being high or low depending on if she is sitting or standing, pt states on 09-02 sitting her BP was 122/79 standing 86/57 then later it was sitting 109/73 and standing 68/55 pt states on 09-06 it was 184/85 sitting and 70/61 standing.  Please call pt.

## 2019-11-26 NOTE — Telephone Encounter (Signed)
I tried to speak with the pt but the phone connection was bad. She stated she was in a doctor's office. I called her back and LVM (ok per DPR) advising per Dr Jaynee Eagles, stop the Sinemet as well as see primary care because the Sinemet can make the orthostatic hypotension worse. I left our office number in message for call back if needed.

## 2019-11-27 NOTE — Telephone Encounter (Signed)
I spoke with the patient. She will stop the Sinemet. She saw a PA with primary care yesterday and she is now wearing compression hose (which are already helping) and adding salt to her diet. She will follow up with PCP. Pt will also let us know if she develops any issues with her tremors or walking while she is off the Sinemet. She has a follow up already scheduled with Dr Jaynee Eagles in January 2022. Pt stated she had not gotten any calls about a possible COVID booster vaccine. I advised her to call her PCP to discuss. She verbalized appreciation for the call.

## 2019-11-28 ENCOUNTER — Ambulatory Visit: Payer: PPO | Admitting: Urology

## 2019-12-03 NOTE — Progress Notes (Signed)
12/04/2019 1:13 PM   [place] [birth year] [id number]  Referring provider: [place] Pink, MD [address] [facility] [place],  [place] [address] Chief Complaint  Patient presents with   Recurrent UTI    HPI: Stephanie Bell is a 79 y.o. female who presents today for a 1 year follow up of recurrent UTI. She is here today with her husband.   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 She has been on several rounds of suppressive antibiotics, including daily and postcoital prophylaxis regimens. - She has been on demand therapy with Macrobid 100 mg dating back to the 1980s. - She has had several cystoscopies which have been unremarkable. - CT of the abdomen pelvis in 03/2018 showed no upper tract abnormalities. - She estimates frequency of her UTI episodes at 3-4 times per year.  She was treated for a Klebsiella UTI by her PCP in late  10/2018. - She was last seen in clinic by Debroah Loop, PA-C on 05/01/2019 for dysuria and frequency (see note for details) and started on trimethoprim antibiotic suppression - Denies having UTI symptoms since starting trimethoprim.  - She was diagnosed with Parkinson's disease several weeks ago. She was placed on Sinemet but was recommended to stop secondary to fluctuating BP.   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    Surgical History: Past Surgical History:  Procedure Laterality Date   BACK SURGERY   05-09-11   x 2 lumbar   CHOLECYSTECTOMY  05-09-11   laparoscopic   COLONOSCOPY  2002, 2008    rectal hyperplastic polyp 2002, diverticulosis 2008   ESOPHAGOGASTRODUODENOSCOPY     HERNIA REPAIR  05/16/2011   hiatal hernia   HIATAL HERNIA REPAIR  02/09/2012   Procedure: LAPAROSCOPIC REPAIR OF HIATAL HERNIA;  Surgeon: Pedro Earls, MD;  Location: [facility];  Service: General;  Laterality: N/A;   INSERTION OF MESH  02/09/2012   Procedure: INSERTION OF MESH;  Surgeon: Pedro Earls, MD;  Location: [facility];  Service: General;;   IRRIGATION AND DEBRIDEMENT SEBACEOUS CYST  05/16/2011   Procedure: IRRIGATION AND DEBRIDEMENT SEBACEOUS CYST;  Surgeon: Pedro Earls, MD;  Location: [facility];  Service: General;  Laterality: N/A;  middle of chest on sternum   LAPAROSCOPIC NISSEN FUNDOPLICATION  0/16/0109   Procedure: LAPAROSCOPIC NISSEN FUNDOPLICATION;  Surgeon: Pedro Earls, MD;  Location: [facility];  Service: General;  Laterality: N/A;  will also remove sebaceous cyst of chest wall   LAPAROSCOPIC NISSEN FUNDOPLICATION  32/35/5732   Procedure: LAPAROSCOPIC NISSEN FUNDOPLICATION;  Surgeon: Pedro Earls, MD;  Location: [facility];  Service: General;  Laterality: N/A;  Re-do Laparoscopic Nissen    SEPTOPLASTY     TONSILLECTOMY  05-09-11   Tonsillectomy   UMBILICAL HERNIA REPAIR  05/16/2011   Procedure: HERNIA REPAIR UMBILICAL ADULT;  Surgeon: Pedro Earls, MD;  Location: [facility];  Service: General;  Laterality: N/A;  with mesh    UPPER GASTROINTESTINAL ENDOSCOPY     VAGINAL HYSTERECTOMY      Home Medications:  Allergies as of 12/04/2019      Reactions   Benzocaine Palpitations   Specific agent was Scandonest 2 %   Epinephrine Hcl (nasal) Palpitations, Other (See Comments)   Reaction:  Tachycardia    Omeprazole Diarrhea   Sulfa Antibiotics Swelling, Rash      Medication List       Accurate as of December 04, 2019  1:13 PM. If you have any questions, ask your nurse or doctor.          aspirin EC 81 MG tablet Take 81 mg by mouth daily.   carbidopa-levodopa 25-100 MG tablet Commonly known as: SINEMET IR Take 1 tablet by mouth 3 (three) times daily. 1 tab 4 hours apart, 3 x daily   Cranberry-Vitamin C 84-20 MG Caps Take 2 capsules by mouth daily.   Multi-Vitamin tablet Take 1 tablet by mouth daily.   ondansetron 4 MG disintegrating tablet Commonly known as: Zofran ODT Dissolve 1 tablet on the tongue every 6 hours as needed for nausea.   ondansetron 4 MG tablet Commonly known as: ZOFRAN Take 4 mg by mouth every 8 (eight) hours as needed for nausea or vomiting.   sertraline 25 MG tablet Commonly known as: ZOLOFT Take 25 mg by mouth daily.   trimethoprim 100 MG tablet Commonly known as: TRIMPEX Take 100 mg by mouth daily.   TUMS PO Take by mouth as needed.   VITAMIN B-12 PO Take 1,000 mcg by mouth daily.   Vitamin D3 125 MCG (5000 UT) Caps Take 5,000 Units by mouth once a week.       Allergies:  Allergies  Allergen Reactions   Benzocaine Palpitations    Specific agent was Scandonest 2 %   Epinephrine Hcl (Nasal) Palpitations and Other (See Comments)    Reaction:  Tachycardia    Omeprazole Diarrhea   Sulfa Antibiotics Swelling and Rash    Family History: Family History  Problem Relation Age of Onset   Ulcers Father    Alcohol abuse Father    Alzheimer's disease Mother    Stroke Mother    Diabetes Mother    Bipolar disorder Mother    Ulcers Paternal Grandmother    Colon cancer Neg Hx    Rectal cancer Neg Hx    Stomach cancer Neg Hx    Esophageal cancer Neg Hx     Social History:  reports that she has never smoked. She has never used smokeless tobacco. She reports current alcohol use. She reports that she does not use drugs.   Physical Exam: BP 100/68    Pulse 74    Ht 5\' 7"  (1.702 m)    Wt 157 lb (71.2 kg)    BMI 24.59 kg/m   Constitutional:  Alert and oriented, No acute distress. HEENT: [place] AT, moist  mucus membranes.  Trachea midline, no masses. Cardiovascular: No clubbing, cyanosis, or edema. Respiratory: Normal respiratory effort, no increased work of breathing. Skin: No rashes, bruises or suspicious lesions. Neurologic: Grossly intact, no focal deficits, moving all 4 extremities. Psychiatric: Normal mood and affect.   Assessment & Plan:    1. Recurrent UTI On trimethoprim suppression x4 months and has been infection free The Rx was written for 6 months If symptoms persist after 6 month treatment I will place her back on trimethoprim.  Continue annual follow-up  [facility] [address], [place], [place] [address] [phone]  I, Selena Batten, am acting as a scribe for Dr. Nicki Reaper C. Jodene Polyak,  I have reviewed the above documentation for accuracy and completeness, and I agree with the above.    Abbie Sons, MD

## 2019-12-04 ENCOUNTER — Other Ambulatory Visit: Payer: Self-pay

## 2019-12-04 ENCOUNTER — Encounter: Payer: Self-pay | Admitting: Urology

## 2019-12-04 ENCOUNTER — Ambulatory Visit: Payer: PPO | Admitting: Urology

## 2019-12-04 VITALS — BP 100/68 | HR 74 | Ht 67.0 in | Wt 157.0 lb

## 2019-12-04 DIAGNOSIS — N390 Urinary tract infection, site not specified: Secondary | ICD-10-CM | POA: Diagnosis not present

## 2019-12-16 ENCOUNTER — Telehealth: Payer: Self-pay | Admitting: Neurology

## 2019-12-16 NOTE — Telephone Encounter (Signed)
Pt called having low BP when standing (83/53. Do Dr. Jaynee Eagles treat me for low BP or PCP. Would like a call from the nurse.

## 2019-12-16 NOTE — Telephone Encounter (Signed)
I called the pt back. She still remains of Sinemet. I have advised her to call her primary care office as she had reported she had recently seen them for orthostatic hypotension. Pt stated her husband is helping her and she is careful standing. She also wanted Dr Jaynee Eagles to know she had received the booster shot. Pt verbalized appreciation for the call.

## 2020-01-01 ENCOUNTER — Other Ambulatory Visit: Payer: Self-pay | Admitting: Physician Assistant

## 2020-01-01 DIAGNOSIS — I10 Essential (primary) hypertension: Secondary | ICD-10-CM | POA: Diagnosis not present

## 2020-01-01 DIAGNOSIS — E0590 Thyrotoxicosis, unspecified without thyrotoxic crisis or storm: Secondary | ICD-10-CM | POA: Diagnosis not present

## 2020-01-01 DIAGNOSIS — Z0000 Encounter for general adult medical examination without abnormal findings: Secondary | ICD-10-CM | POA: Diagnosis not present

## 2020-01-01 DIAGNOSIS — I951 Orthostatic hypotension: Secondary | ICD-10-CM | POA: Diagnosis not present

## 2020-01-02 ENCOUNTER — Telehealth: Payer: Self-pay | Admitting: Neurology

## 2020-01-02 NOTE — Telephone Encounter (Signed)
She called regarding continued lightheadedness and orthostatic hypotension (husband measures BP).   I reviewed her chart.  She has discontinued Sinmet and symptoms are just slightly better.     I advised her to increase her salt intake and to discuss further with her PCP if symptoms persist

## 2020-01-08 ENCOUNTER — Telehealth: Payer: Self-pay | Admitting: Neurology

## 2020-01-08 DIAGNOSIS — G20 Parkinson's disease: Secondary | ICD-10-CM

## 2020-01-08 NOTE — Telephone Encounter (Signed)
Pt called wanting to speak to RN or Provider about what has been transpiring since she has been off of the carbidopa-levodopa (SINEMET IR) 25-100 MG tablet Please advise.

## 2020-01-12 MED ORDER — CARBIDOPA-LEVODOPA 25-100 MG PO TABS: ORAL_TABLET | ORAL | 0 refills | Status: DC

## 2020-01-12 NOTE — Telephone Encounter (Signed)
Spoke with Dr Jaynee Eagles and was advised that anything she can prescribe patient for the parkinson's can cause dizziness and affect the orthostatic hypotension. She feels the patient needs a specialist at [facility] movement disorder clinic to be evaluated for more treatment options.   I called the pt to discuss and LVM (ok per DPR) asking for a call back.

## 2020-01-12 NOTE — Telephone Encounter (Signed)
Pt returned my call. We discussed the recommendation from Dr Jaynee Eagles. The pt is in agreement to be referred to [facility]. She did state that her BP is back to normal, "perfect", not dropping when standing. However, her tremors are back. She stated she had done so well for months on the Sinemet before it affected her BP so she has asked if Dr Jaynee Eagles would consider letting her try 1/2 tablet, or BID dosing rather than TID, or QOD dosing. She has PT pending start next month. I let her know the message would be sent to Dr Jaynee Eagles and we would call her back. We would likely still refer to [facility]. She verbalized appreciation and understanding.

## 2020-01-12 NOTE — Telephone Encounter (Signed)
Sure! Have her try 1/2 tablet 2-3x a day and we will refer to [facility], they are th ebest. Thank you !

## 2020-01-12 NOTE — Telephone Encounter (Signed)
I spoke with the patient and discussed the message from Dr. Jaynee Eagles regarding trying Sinemet again at 0.5 tablet 2-3 times per day and that we will refer to [facility] as [facility]. The patient verbalized appreciation and understanding. She needs a new rx. I sent this to her pharmacy per v.o. Dr Jaynee Eagles.

## 2020-01-12 NOTE — Telephone Encounter (Signed)
Pt called, was instructed by Dr. Jaynee Eagles to stop taking carbidopa-levodopa (SINEMET IR) 25-100 MG tablet. Pt stated, not getting dizzy , but I still have to deal with Parkinson. Where do we go from here? Would like a call from the nurse.

## 2020-01-12 NOTE — Addendum Note (Signed)
Addended by: Gildardo Griffes on: 01/12/2020 05:29 PM   Modules accepted: Orders

## 2020-01-13 NOTE — Addendum Note (Signed)
Addended by: Gildardo Griffes on: 01/13/2020 02:07 PM   Modules accepted: Orders

## 2020-01-13 NOTE — Telephone Encounter (Signed)
Spoke with Dr Jaynee Eagles. Referral placed for [facility] for parkinson's disease.

## 2020-01-20 ENCOUNTER — Other Ambulatory Visit: Payer: Self-pay

## 2020-01-20 ENCOUNTER — Ambulatory Visit: Payer: PPO | Attending: Neurology

## 2020-01-20 DIAGNOSIS — R2681 Unsteadiness on feet: Secondary | ICD-10-CM | POA: Insufficient documentation

## 2020-01-20 DIAGNOSIS — M6281 Muscle weakness (generalized): Secondary | ICD-10-CM | POA: Diagnosis not present

## 2020-01-20 DIAGNOSIS — R2689 Other abnormalities of gait and mobility: Secondary | ICD-10-CM

## 2020-01-20 NOTE — Therapy (Signed)
[place] MAIN [facility] SERVICES [address] [place], [place], [address] Phone: [phone]   Fax:  [phone]  Physical Therapy Evaluation  Patient Details  Name: Stephanie Bell MRN: [id number] Date of Birth: 18-Jan-1941 Referring Provider (PT): Sarina Ill    Encounter Date: 01/20/2020   PT End of Session - 01/20/20 1328    Visit Number 1    Number of Visits 16    Date for PT Re-Evaluation 03/16/20    Authorization Type 1/10 eval 11/2    PT Start Time 1105    PT Stop Time 1200    PT Time Calculation (min) 55 min    Equipment Utilized During Treatment Gait belt    Activity Tolerance Patient limited by fatigue    Behavior During Therapy [facility] for tasks assessed/performed           Past Medical History:  Diagnosis Date  . ADD (attention deficit disorder)   . Anemia   . Esophageal stricture 11/04/2014  . GERD (gastroesophageal reflux disease)   . H/O hiatal hernia   . Hx: UTI (urinary tract infection)   . Interstitial cystitis   . Kidney stone   . Lumbago   . Palpitations 02/2017  . Paraesophageal hernia   . Parkinson's disease ([place]) 07/2019  . Pneumonia 05-09-11   2011 last time  . Stroke ([place])   . Weight loss   . Wound disruption, post-op, skin   . 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 BACK SURGERY  05-09-11   x 2 lumbar  . CHOLECYSTECTOMY  05-09-11   laparoscopic  . COLONOSCOPY  2002, 2008    rectal hyperplastic polyp 2002, diverticulosis 2008  . ESOPHAGOGASTRODUODENOSCOPY    . HERNIA REPAIR  05/16/2011   hiatal hernia  . HIATAL HERNIA REPAIR  02/09/2012   Procedure: LAPAROSCOPIC REPAIR OF HIATAL HERNIA;  Surgeon: Pedro Earls, MD;  Location: [facility];  Service: General;  Laterality: N/A;  . INSERTION OF MESH  02/09/2012   Procedure: INSERTION OF MESH;  Surgeon: Pedro Earls, MD;  Location: [facility];  Service: General;;  . IRRIGATION AND  DEBRIDEMENT SEBACEOUS CYST  05/16/2011   Procedure: IRRIGATION AND DEBRIDEMENT SEBACEOUS CYST;  Surgeon: Pedro Earls, MD;  Location: [facility];  Service: General;  Laterality: N/A;  middle of chest on sternum  . LAPAROSCOPIC NISSEN FUNDOPLICATION  7/67/3419   Procedure: LAPAROSCOPIC NISSEN FUNDOPLICATION;  Surgeon: Pedro Earls, MD;  Location: [facility];  Service: General;  Laterality: N/A;  will also remove sebaceous cyst of chest wall  . LAPAROSCOPIC NISSEN FUNDOPLICATION  37/90/2409   Procedure: LAPAROSCOPIC NISSEN FUNDOPLICATION;  Surgeon: Pedro Earls, MD;  Location: [facility];  Service: General;  Laterality: N/A;  Re-do Laparoscopic Nissen   . SEPTOPLASTY    . TONSILLECTOMY  05-09-11   Tonsillectomy  . UMBILICAL HERNIA REPAIR  05/16/2011   Procedure: HERNIA REPAIR UMBILICAL ADULT;  Surgeon: Pedro Earls, MD;  Location: [facility];  Service: General;  Laterality: N/A;  with mesh   . UPPER GASTROINTESTINAL ENDOSCOPY    . VAGINAL HYSTERECTOMY      There were no vitals filed for this visit.    Subjective Assessment - 01/20/20 1138    Subjective Patient is a pleasant 79 year old female who presents to physical therapy for first time.    Pertinent History Patient is an 79 year old female with PMH of orthostatic hypotension, Parkinsons,  GERD, anemia, lumbago, kidney stone, CAD, acute ischemic VBA thalamic stroke, lacunar stroke, HTN, aortic atherosclerosis, s/p Nissen fundoplication, ADD, history of stroke. Patient played competative tennis up until 2019, was in the water 2-3x/week. Once pandemic hit lost strength and tone.  Walks in the house with a rollator.  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    Limitations Standing;Walking;House hold activities    How long can you sit comfortably? n/a    How long can you stand comfortably? less than 5 minutes    How long can you walk comfortably? can't walk more than 5 minutes    Patient Stated Goals to get  stronger and back to tennis    Currently in Pain? No/denies             Vitals : BP seated: 113/63  BP standing: : 105/ 56    [facility] PT Assessment - 01/20/20 0001      Assessment   Medical Diagnosis Parkinsons/gait.     Referring Provider (PT) Sarina Ill     Onset Date/Surgical Date --   2019   Hand Dominance Right    Prior Therapy no      Precautions   Precautions Fall      Restrictions   Weight Bearing Restrictions No      Balance Screen   Has the patient fallen in the past 6 months No    Has the patient had a decrease in activity level because of a fear of falling?  Yes    Is the patient reluctant to leave their home because of a fear of falling?  No      Home Environment   Living Environment Private residence    Living Arrangements Spouse/significant other    Type of [place] to enter    Entrance Stairs-Number of Steps [place] Two level;Able to live on main level with bedroom/bathroom    [place] - 4 wheels;Cane - single point;Shower seat;Grab bars - toilet;Grab bars - tub/shower      Prior Function   Level of Independence Independent    Leisure paint, play tennis, swim       Observation/Other Assessments   Focus on Therapeutic Outcomes (FOTO)  40.2%      Standardized Balance Assessment   Standardized Balance Assessment Berg Balance Test      Berg Balance Test   Sit to Stand Able to stand without using hands and stabilize independently    Standing Unsupported Able to stand 2 minutes with supervision    Sitting with Back Unsupported but Feet Supported on Floor or Stool Able to sit safely and securely 2 minutes    Stand to Sit Sits safely with minimal use of hands    Transfers Able to transfer safely, definite need of hands    Standing Unsupported with Eyes Closed Able to stand 10 seconds with supervision    Standing Unsupported with Feet Together Able to place feet together independently but unable to hold  for 30 seconds    From Standing, Reach Forward with Outstretched Arm Can reach forward >5 cm safely (2")    From Standing Position, Pick up Object from Floor Able to pick up shoe, needs supervision    From Standing Position, Turn to Look Behind Over each Shoulder Looks behind from both sides and weight shifts well    Turn 360 Degrees Able to turn 360  degrees safely but slowly    Standing Unsupported, Alternately Place Feet on Step/Stool Needs assistance to keep from falling or unable to try    Standing Unsupported, One Foot in Front Needs help to step but can hold 15 seconds    Standing on One Leg Unable to try or needs assist to prevent fall    Total Score 35           PAIN: Patient reports occasional hip and back pain when standing for too lon.   POSTURE: Seated: WFL, slight rounding of shoulders  Standing: trunk flexed with slight knee flexion   PROM/AROM:  AROM BUE: WFL  AROM BLE: WFL     STRENGTH:  Graded on a 0-5 scale Muscle Group Left Right  Hip Flex 4-/5 4-/5  Hip Abd 3+/5 3+/5  Hip Add 3+/5 3+/5  Hip Ext 2+/5 2+/5  Hip IR/ER 2+/5 2+/5  Knee Flex 3+/5 3+/5  Knee Ext 4-/5 4-/5  Ankle DF 4-/5 4-/5  Ankle PF 4-/5 4-/5   SENSATION:    SOMATOSENSORY:  Any N & T in extremities or weakness: reports :         Sensation           Intact      Diminished         Absent  Light touch LEs                              COORDINATION: Finger to Nose: Dysmetric LUE with past pointing          Heel Shin Slide Test:challenged with bilateral LE     FUNCTIONAL MOBILITY:  STS: able to perform without UE support slowly  BALANCE: Static Sitting Balance  Normal Able to maintain balance against maximal resistance   Good Able to maintain balance against moderate resistance   Good-/Fair+ Accepts minimal resistance x  Fair Able to sit unsupported without balance loss and without UE support   Poor+ Able to maintain with Minimal assistance from individual or chair   Poor  Unable to maintain balance-requires mod/max support from individual or chair    Static Standing Balance  Normal Able to maintain standing balance against maximal resistance   Good Able to maintain standing balance against moderate resistance   Good-/Fair+ Able to maintain standing balance against minimal resistance   Fair Able to stand unsupported without UE support and without LOB for 1-2 min x  Fair- Requires Min A and UE support to maintain standing without loss of balance   Poor+ Requires mod A and UE support to maintain standing without loss of balance   Poor Requires max A and UE support to maintain standing balance without loss    Dynamic Sitting Balance  Normal Able to sit unsupported and weight shift across midline maximally   Good Able to sit unsupported and weight shift across midline moderately   Good-/Fair+ Able to sit unsupported and weight shift across midline minimally   Fair Minimal weight shifting ipsilateral/front, difficulty crossing midline x  Fair- Reach to ipsilateral side and unable to weight shift   Poor + Able to sit unsupported with min A and reach to ipsilateral side, unable to weight shift   Poor Able to sit unsupported with mod A and reach ipsilateral/front-can't cross midline    Standing Dynamic Balance  Normal Stand independently unsupported, able to weight shift and cross midline maximally   Good Stand independently unsupported, able  to weight shift and cross midline moderately   Good-/Fair+ Stand independently unsupported, able to weight shift across midline minimally   Fair Stand independently unsupported, weight shift, and reach ipsilaterally, loss of balance when crossing midline x  Poor+ Able to stand with Min A and reach ipsilaterally, unable to weight shift   Poor Able to stand with Mod A and minimally reach ipsilaterally, unable to cross midline.      GAIT: Patient ambulates with SPC/hurrycane with slight bilateral knee flexion, trunk flexed and  poor foot clearance bilaterally. Complaints of hip pain with 10 MWT.   OUTCOME MEASURES: TEST Outcome Interpretation  5 times sit<>stand 45.02 sec w/o UE support  >72 yo, >15 sec indicates increased risk for falls  10 meter walk test         12 seconds with SPC         m/s <1.0 m/s indicates increased risk for falls; limited community ambulator          [employer] Assessment 35/56  <36/56 (100% risk for falls), 37-45 (80% risk for falls); 46-51 (>50% risk for falls); 52-55 (lower risk <25% of falls)  FOTO 40.2% Predicted DC score of 53%         Access Code: [email or web address] URL: [email or web address] Date: 01/20/2020 Prepared by: Janna Arch  Exercises Sit to Stand without Arm Support - 1 x daily - 7 x weekly - 2 sets - 10 reps - 5 hold Standing Hip Extension with Counter Support - 1 x daily - 7 x weekly - 2 sets - 10 reps - 5 hold Seated Long Arc Quad - 1 x daily - 7 x weekly - 2 sets - 10 reps - 5 hold Seated March - 1 x daily - 7 x weekly - 2 sets - 10 reps - 5 hold Seated Heel Toe Raises - 1 x daily - 7 x weekly - 2 sets - 10 reps - 5 hold         Objective measurements completed on examination: See above findings.               PT Education - 01/20/20 1328    Education Details goals, POC, role of PT, HEP    Person(s) Educated Patient    Methods Explanation;Demonstration;Tactile cues;Verbal cues;Handout    Comprehension Verbalized understanding;Returned demonstration;Verbal cues required;Tactile cues required;Need further instruction            PT Short Term Goals - 01/20/20 1331      PT SHORT TERM GOAL #1   Title Patient will be independent in home exercise program to improve strength/mobility for better functional independence with ADLs.    Baseline 11/2: HEP given    Time 4    Period Weeks    Status New    Target Date 02/17/20             PT Long Term Goals - 01/20/20 1331      PT LONG TERM GOAL #1   Title Patient will  increase FOTO score to equal to or greater than 53%  to demonstrate statistically significant improvement in mobility and quality of life.    Baseline 11/2: 40.2%    Time 8    Period Weeks    Status New    Target Date 03/16/20      PT LONG TERM GOAL #2   Title Patient (> 33 years old) will complete five times sit to stand test in < 15 seconds indicating an increased LE strength  and improved balance    Baseline 11/2: 45.02 seconds    Time 8    Period Weeks    Status New    Target Date 03/16/20      PT LONG TERM GOAL #3   Title Patient will increase Berg Balance score by > 6 points ( 41/56)  to demonstrate decreased fall risk during functional activities.    Baseline 11/2: 35/56    Time 8    Period Weeks    Status New    Target Date 03/16/20      PT LONG TERM GOAL #4   Title Patient will increase six minute walk test distance to >1000 for progression to community ambulator and improve gait ability    Baseline 11/2: unable to tolerate this session    Time 8    Period Weeks    Target Date 03/16/20      PT LONG TERM GOAL #5   Title Patient will increase BLE gross strength to 4+/5 as to improve functional strength for independent gait, increased standing tolerance and increased ADL ability.    Baseline 11/2: see note    Time 8    Period Weeks    Status New    Target Date 03/16/20                  Plan - 01/20/20 1328    Clinical Impression Statement Patient is a pleasant 79 year old female who presents with diffuse weakness and unsteadiness for evaluation of idiopathic Parkinson's disease and gait abnormalities. Patient is verbose and tangential requiring cueing for task orientation however is very pleasant and eager to participate in evaluation. Tremor of LUE noted throughout session. Patient is not able to tolerate prolonged standing at this time and has limited capacity for functional mobility. Patient will benefit from skilled physical therapy to increase strength,  mobility, and stability to allow for return to PLOF.    Personal Factors and Comorbidities Age;Comorbidity 3+;Time since onset of injury/illness/exacerbation;Transportation    Comorbidities orthostatic hypotension, Parkinsons, GERD, anemia, lumbago, kidney stone, CAD, acute ischemic VBA thalamic stroke, lacunar stroke, HTN, aortic atherosclerosis, s/p Nissen fundoplication, ADD, history of stroke.    Examination-Activity Limitations Bed Mobility;Caring for Others;Carry;Continence;Dressing;Reach Overhead;Locomotion Level;Lift;Squat;Stairs;Stand;Toileting;Transfers    Examination-Participation Restrictions Church;Cleaning;Community Activity;Interpersonal Relationship;Meal Prep;Laundry;School;Shop;Volunteer;Yard Work    Merchant [employer] officer Evolving/Moderate complexity    Clinical Decision Making Moderate    Rehab Potential Fair    PT Frequency 2x / week    PT Duration 8 weeks    PT Treatment/Interventions ADLs/Self Care Home Management;Aquatic Therapy;Biofeedback;Canalith Repostioning;Moist Heat;Cryotherapy;DME Instruction;Gait training;Stair training;Functional mobility training;Therapeutic activities;Therapeutic exercise;Balance training;Neuromuscular re-education;Patient/family education;Manual techniques;Taping;Energy conservation;Dry needling;Passive range of motion;Vestibular;Visual/perceptual remediation/compensation    PT Next Visit Plan increase strength in standing and balance    PT Home Exercise Plan see above    Consulted and Agree with Plan of Care Patient           Patient will benefit from skilled therapeutic intervention in order to improve the following deficits and impairments:  Abnormal gait, Cardiopulmonary status limiting activity, Decreased activity tolerance, Decreased coordination, Decreased balance, Decreased endurance, Decreased safety awareness, Decreased mobility, Decreased strength, Difficulty walking, Impaired flexibility, Impaired perceived functional  ability, Postural dysfunction, Improper body mechanics, Pain  Visit Diagnosis: Other abnormalities of gait and mobility  Unsteadiness on feet  Muscle weakness (generalized)     Problem List Patient Active Problem List   Diagnosis Date Noted  . 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[place]) 06/30/2019  . CAD in native artery  05/29/2018  . Anemia, unspecified 10/16/2017  . Kidney stone 10/16/2017  . 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 Essential hypertension   . Atypical chest pain   . Chest pain 03/04/2017  . 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[place]) 06/01/2015  . Lacunar stroke 05/14/2015  . Elevated troponin 01/22/2015  . Esophageal stricture 11/04/2014  . GERD (gastroesophageal reflux disease) 11/04/2014  . 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 Hand pain 05/01/2013  . Sebaceous cyst of breast 12/20/2011  . S/P Nissen fundoplication (without gastrostomy tube) procedure 06/08/2011   Janna Arch, PT, DPT   01/20/2020, 1:35 PM  [place] MAIN [facility] SERVICES [address] [place], [place], [address] Phone: [phone]   Fax:  [phone]  Name: Stephanie Bell MRN: [id number] Date of Birth: Jun 19, 1940

## 2020-01-20 NOTE — Patient Instructions (Signed)
Access Code: [email or web address] URL: [email or web address] Date: 01/20/2020 Prepared by: Janna Arch  Exercises Sit to Stand without Arm Support - 1 x daily - 7 x weekly - 2 sets - 10 reps - 5 hold Standing Hip Extension with Counter Support - 1 x daily - 7 x weekly - 2 sets - 10 reps - 5 hold Seated Long Arc Quad - 1 x daily - 7 x weekly - 2 sets - 10 reps - 5 hold Seated March - 1 x daily - 7 x weekly - 2 sets - 10 reps - 5 hold Seated Heel Toe Raises - 1 x daily - 7 x weekly - 2 sets - 10 reps - 5 hold

## 2020-01-27 ENCOUNTER — Ambulatory Visit: Payer: PPO

## 2020-01-27 ENCOUNTER — Other Ambulatory Visit: Payer: Self-pay

## 2020-01-27 DIAGNOSIS — M6281 Muscle weakness (generalized): Secondary | ICD-10-CM

## 2020-01-27 DIAGNOSIS — R2681 Unsteadiness on feet: Secondary | ICD-10-CM

## 2020-01-27 DIAGNOSIS — R2689 Other abnormalities of gait and mobility: Secondary | ICD-10-CM | POA: Diagnosis not present

## 2020-01-27 NOTE — Therapy (Signed)
[place] MAIN [facility] SERVICES [address] [place], [place], [address] Phone: [phone]   Fax:  [phone]  Physical Therapy Treatment  Patient Details  Name: Stephanie Bell MRN: [id number] Date of Birth: 1940/06/03 Referring Provider (PT): Sarina Ill    Encounter Date: 01/27/2020   PT End of Session - 01/27/20 1115    Visit Number 2    Number of Visits 16    Date for PT Re-Evaluation 03/16/20    PT Start Time 1101    PT Stop Time 1141    PT Time Calculation (min) 40 min    Equipment Utilized During Treatment Gait belt    Activity Tolerance Patient limited by fatigue;Treatment limited secondary to medical complications (Comment)   standing tolerance limited due to repeated orthostatic hypotension   Behavior During Therapy [facility] for tasks assessed/performed           Past Medical History:  Diagnosis Date   ADD (attention deficit disorder)    Anemia    Esophageal stricture 11/04/2014   GERD (gastroesophageal reflux disease)    H/O hiatal hernia    Hx: UTI (urinary tract infection)    Interstitial cystitis    Kidney stone    Lumbago    Palpitations 02/2017   Paraesophageal hernia    Parkinson's disease ([place]) 07/2019   Pneumonia 05-09-11   2011 last time   Stroke [facility])    Weight loss    Wound disruption, post-op, skin    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    Past Surgical History:  Procedure Laterality Date   BACK SURGERY  05-09-11   x 2 lumbar   CHOLECYSTECTOMY  05-09-11   laparoscopic   COLONOSCOPY  2002, 2008    rectal hyperplastic polyp 2002, diverticulosis 2008   ESOPHAGOGASTRODUODENOSCOPY     HERNIA REPAIR  05/16/2011   hiatal hernia   HIATAL HERNIA REPAIR  02/09/2012   Procedure: LAPAROSCOPIC REPAIR OF HIATAL HERNIA;  Surgeon: Pedro Earls, MD;  Location: [facility];  Service: General;  Laterality: N/A;   INSERTION OF MESH  02/09/2012   Procedure: INSERTION OF MESH;   Surgeon: Pedro Earls, MD;  [facility]: [facility];  Service: General;;   IRRIGATION AND DEBRIDEMENT SEBACEOUS CYST  05/16/2011   Procedure: IRRIGATION AND DEBRIDEMENT SEBACEOUS CYST;  Surgeon: Pedro Earls, MD;  [facility]: [facility];  Service: General;  Laterality: N/A;  middle of chest on sternum   LAPAROSCOPIC NISSEN FUNDOPLICATION  05/09/2540   Procedure: LAPAROSCOPIC NISSEN FUNDOPLICATION;  Surgeon: Pedro Earls, MD;  [facility]: [facility];  Service: General;  Laterality: N/A;  will also remove sebaceous cyst of chest wall   LAPAROSCOPIC NISSEN FUNDOPLICATION  70/62/3762   Procedure: LAPAROSCOPIC NISSEN FUNDOPLICATION;  Surgeon: Pedro Earls, MD;  [facility]: [facility];  Service: General;  Laterality: N/A;  Re-do Laparoscopic Nissen    SEPTOPLASTY     TONSILLECTOMY  05-09-11   Tonsillectomy   UMBILICAL HERNIA REPAIR  05/16/2011   Procedure: HERNIA REPAIR UMBILICAL ADULT;  Surgeon: Pedro Earls, MD;  Location: [facility];  Service: General;  Laterality: N/A;  with mesh    UPPER GASTROINTESTINAL ENDOSCOPY     VAGINAL HYSTERECTOMY      There were no vitals filed for this visit.   Subjective Assessment - 01/27/20 1105    Subjective Pt reports no updates since last visit She reports attemping HEP but unable to do    Pertinent History Patient is an 79 year old female with  PMH of orthostatic hypotension, Parkinsons, GERD, anemia, lumbago, kidney stone, CAD, acute ischemic VBA thalamic stroke, lacunar stroke, HTN, aortic atherosclerosis, s/p Nissen fundoplication, ADD, history of stroke. Patient played competative tennis up until 2019, was in the water 2-3x/week. Once pandemic hit lost strength and tone.  Walks in the house with a rollator.  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    Limitations Standing;Walking;House hold activities    Currently in Pain? No/denies           Intervention this date:  Orthostatic Vitals at beginning of session -136/79  68 seated -120/74 75 0 min -115/79  1 min (asks to sit thereafter)  -132/74  72 seated again   -AMB overground 4x46ft   -5xSTS hands free Set one from 19-inches Set two from 21-inches Set three from 24 inches  STS x 10 from 24 inches   LAQ from chair 1x10 bilat Seated marching from chair 2x10 bilat     PT Short Term Goals - 01/27/20 1204      PT SHORT TERM GOAL #1   Title Patient will be independent in home exercise program to improve strength/mobility for better functional independence with ADLs.    Baseline 11/2: HEP given; 11/9 modified    Period Weeks    Status On-going    Target Date 02/17/20             PT Long Term Goals - 01/27/20 1204      PT LONG TERM GOAL #1   Title Patient will increase FOTO score to equal to or greater than 53%  to demonstrate statistically significant improvement in mobility and quality of life.    Baseline 11/2: 40.2%    Time 8    Period Weeks    Status On-going      PT LONG TERM GOAL #2   Title Patient (> 1 years old) will complete five times sit to stand test in < 15 seconds indicating an increased LE strength and improved balance    Baseline 11/2: 45.02 seconds    Time 8    Period Weeks    Status On-going    Target Date 03/16/20      PT LONG TERM GOAL #3   Title Patient will increase Berg Balance score by > 6 points (41/56)  to demonstrate decreased fall risk during functional activities.    Baseline 11/2: 35/56    Time 8    Period Weeks    Status On-going    Target Date 03/16/20      PT LONG TERM GOAL #4   Title Patient will increase six minute walk test distance to >1000 for progression to community ambulator and improve gait ability    Baseline 11/2: unable to tolerate this session    Time 8    Period Weeks    Status On-going    Target Date 03/16/20      PT LONG TERM GOAL #5   Title Patient will increase BLE gross strength to 4+/5 as to improve functional strength for independent gait, increased standing  tolerance and increased ADL ability.    Baseline 11/2: see note    Time 8    Period Weeks    Status On-going    Target Date 03/16/20                 Plan - 01/27/20 1159    Clinical Impression Statement Pt here for visit 2. Began to expand  interventions. Pt needs help with HEP modification to improve success- author recommends seat elevation to 24" for STS transfers. Author also explained that LAQ does not mandate concurrent SLR hip flexion from chair. Pt consistently has a standing tolerance of 1-2 minutes max prior to onset global fatigue, dizziness, or other syncopal prodrome. Started overground AMB interval training s AD to also incoportate balance intervention. HEP reviewed in full. Pt fatigues t/o session, requires seated rest interval 2-3 minutes between each actiivty.    Personal Factors and Comorbidities Age;Comorbidity 3+;Time since onset of injury/illness/exacerbation;Transportation    Comorbidities orthostatic hypotension, Parkinsons, GERD, anemia, lumbago, kidney stone, CAD, acute ischemic VBA thalamic stroke, lacunar stroke, HTN, aortic atherosclerosis, s/p Nissen fundoplication, ADD, history of stroke.    Examination-Activity Limitations Bed Mobility;Caring for Others;Carry;Continence;Dressing;Reach Overhead;Locomotion Level;Lift;Squat;Stairs;Stand;Toileting;Transfers    Examination-Participation Restrictions Church;Cleaning;Community Activity;Interpersonal Relationship;Meal Prep;Laundry;School;Shop;Volunteer;Yard Work    [employer]chant [employer] officer Evolving/Moderate complexity    Clinical Decision Making Moderate    Rehab Potential Fair    PT Frequency 2x / week    PT Duration 8 weeks    PT Treatment/Interventions ADLs/Self Care Home Management;Aquatic Therapy;Biofeedback;Canalith Repostioning;Moist Heat;Cryotherapy;DME Instruction;Gait training;Stair training;Functional mobility training;Therapeutic activities;Therapeutic exercise;Balance training;Neuromuscular  re-education;Patient/family education;Manual techniques;Taping;Energy conservation;Dry needling;Passive range of motion;Vestibular;Visual/perceptual remediation/compensation    PT Next Visit Plan slowly progress standing activity within tolerance, monitor symptoms and BP    PT Home Exercise Plan Reviewed, modified to accomodate strength impairment.    Consulted and Agree with Plan of Care Patient           Patient will benefit from skilled therapeutic intervention in order to improve the following deficits and impairments:  Abnormal gait, Cardiopulmonary status limiting activity, Decreased activity tolerance, Decreased coordination, Decreased balance, Decreased endurance, Decreased safety awareness, Decreased mobility, Decreased strength, Difficulty walking, Impaired flexibility, Impaired perceived functional ability, Postural dysfunction, Improper body mechanics, Pain  Visit Diagnosis: Other abnormalities of gait and mobility  Unsteadiness on feet  Muscle weakness (generalized)     Problem List Patient Active Problem List   Diagnosis Date Noted   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[place]) 06/30/2019   CAD in native artery 05/29/2018   Anemia, unspecified 10/16/2017   Kidney stone 10/16/2017   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   Essential hypertension    Atypical chest pain    Chest pain 03/04/2017   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[place]) 06/01/2015   Lacunar stroke 05/14/2015   Elevated troponin 01/22/2015   Esophageal stricture 11/04/2014   GERD (gastroesophageal reflux disease) 11/04/2014   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

## 2020-01-29 ENCOUNTER — Ambulatory Visit: Payer: PPO | Admitting: Physical Therapy

## 2020-01-29 ENCOUNTER — Other Ambulatory Visit: Payer: Self-pay

## 2020-01-29 ENCOUNTER — Encounter: Payer: Self-pay | Admitting: Physical Therapy

## 2020-01-29 VITALS — BP 135/86

## 2020-01-29 DIAGNOSIS — R2689 Other abnormalities of gait and mobility: Secondary | ICD-10-CM

## 2020-01-29 DIAGNOSIS — R2681 Unsteadiness on feet: Secondary | ICD-10-CM

## 2020-01-29 DIAGNOSIS — M6281 Muscle weakness (generalized): Secondary | ICD-10-CM

## 2020-01-29 NOTE — Therapy (Addendum)
[place] MAIN [facility] SERVICES [address] [place], [place], [address] Phone: [phone]   Fax:  [phone]  Physical Therapy Treatment  Patient Details  Name: Stephanie Bell MRN: [id number] Date of Birth: June 01, 1940 Referring Provider (PT): Sarina Ill    Encounter Date: 01/29/2020   PT End of Session - 01/29/20 1107    Visit Number 3    Number of Visits 16    Date for PT Re-Evaluation 03/16/20    Authorization Type 1/10 eval 11/2    PT Start Time 1104    PT Stop Time 1145    PT Time Calculation (min) 41 min    Equipment Utilized During Treatment Gait belt    Activity Tolerance Patient limited by fatigue;Treatment limited secondary to medical complications (Comment)    Behavior During Therapy [facility] for tasks assessed/performed           Past Medical History:  Diagnosis Date  . ADD (attention deficit disorder)   . Anemia   . Esophageal stricture 11/04/2014  . GERD (gastroesophageal reflux disease)   . H/O hiatal hernia   . Hx: UTI (urinary tract infection)   . Interstitial cystitis   . Kidney stone   . Lumbago   . Palpitations 02/2017  . Paraesophageal hernia   . Parkinson's disease ([place]) 07/2019  . Pneumonia 05-09-11   2011 last time  . Stroke ([place])   . Weight loss   . Wound disruption, post-op, skin   . 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 BACK SURGERY  05-09-11   x 2 lumbar  . CHOLECYSTECTOMY  05-09-11   laparoscopic  . COLONOSCOPY  2002, 2008    rectal hyperplastic polyp 2002, diverticulosis 2008  . ESOPHAGOGASTRODUODENOSCOPY    . HERNIA REPAIR  05/16/2011   hiatal hernia  . HIATAL HERNIA REPAIR  02/09/2012   Procedure: LAPAROSCOPIC REPAIR OF HIATAL HERNIA;  Surgeon: Pedro Earls, MD;  [facility];  Service: General;  Laterality: N/A;  . INSERTION OF MESH  02/09/2012   Procedure: INSERTION OF MESH;  Surgeon: Pedro Earls, MD;   [facility];  Service: General;;  . IRRIGATION AND DEBRIDEMENT SEBACEOUS CYST  05/16/2011   Procedure: IRRIGATION AND DEBRIDEMENT SEBACEOUS CYST;  Surgeon: Pedro Earls, MD;  [facility];  Service: General;  Laterality: N/A;  middle of chest on sternum  . LAPAROSCOPIC NISSEN FUNDOPLICATION  09/10/7626   Procedure: LAPAROSCOPIC NISSEN FUNDOPLICATION;  Surgeon: Pedro Earls, MD;  [facility];  Service: General;  Laterality: N/A;  will also remove sebaceous cyst of chest wall  . LAPAROSCOPIC NISSEN FUNDOPLICATION  31/51/7616   Procedure: LAPAROSCOPIC NISSEN FUNDOPLICATION;  Surgeon: Pedro Earls, MD;  [facility];  Service: General;  Laterality: N/A;  Re-do Laparoscopic Nissen   . SEPTOPLASTY    . TONSILLECTOMY  05-09-11   Tonsillectomy  . UMBILICAL HERNIA REPAIR  05/16/2011   Procedure: HERNIA REPAIR UMBILICAL ADULT;  Surgeon: Pedro Earls, MD;  [facility];  Service: General;  Laterality: N/A;  with mesh   . UPPER GASTROINTESTINAL ENDOSCOPY    . VAGINAL HYSTERECTOMY      There were no vitals filed for this visit.   Subjective Assessment - 01/29/20 1106    Subjective Pt reports no updates since last visit She reports attemping HEP but unable to do    Pertinent History Patient is an 79 year old female with PMH  of orthostatic hypotension, Parkinsons, GERD, anemia, lumbago, kidney stone, CAD, acute ischemic VBA thalamic stroke, lacunar stroke, HTN, aortic atherosclerosis, s/p Nissen fundoplication, ADD, history of stroke. Patient played competative tennis up until 2019, was in the water 2-3x/week. Once pandemic hit lost strength and tone.  Walks in the house with a rollator.  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    Limitations Standing;Walking;House hold activities    Currently in Pain? No/denies    Multiple Pain Sites No           Intervention this date:  Orthostatic Vitals at beginning of session -135/86      70 seated -123/71 78 min    -AMB overground 4x25ft   -5xSTS hands with hands Set one from 19-inches  -5xSTS hands with hands Set one from 19-inches x 10   Gait training  315 feet with rollator and flexed posture  LAQ from chair 1x10 bilat Seated marching from chair 2x10 bilat  Patient performed with instruction, verbal cues, tactile cues of therapist: goal: increase tissue extensibility, promote proper posture, improve mobility                          PT Education - 01/29/20 1107    Education Details SAFETY    Person(s) Educated Patient    Methods Explanation    Comprehension Verbalized understanding            PT Short Term Goals - 01/27/20 1204      PT SHORT TERM GOAL #1   Title Patient will be independent in home exercise program to improve strength/mobility for better functional independence with ADLs.    Baseline 11/2: HEP given; 11/9 modified    Period Weeks    Status On-going    Target Date 02/17/20             PT Long Term Goals - 01/27/20 1204      PT LONG TERM GOAL #1   Title Patient will increase FOTO score to equal to or greater than 53%  to demonstrate statistically significant improvement in mobility and quality of life.    Baseline 11/2: 40.2%    Time 8    Period Weeks    Status On-going      PT LONG TERM GOAL #2   Title Patient (> 56 years old) will complete five times sit to stand test in < 15 seconds indicating an increased LE strength and improved balance    Baseline 11/2: 45.02 seconds    Time 8    Period Weeks    Status On-going    Target Date 03/16/20      PT LONG TERM GOAL #3   Title Patient will increase Berg Balance score by > 6 points (41/56)  to demonstrate decreased fall risk during functional activities.    Baseline 11/2: 35/56    Time 8    Period Weeks    Status On-going    Target Date 03/16/20      PT LONG TERM GOAL #4   Title Patient will increase six minute walk test distance to >1000 for  progression to community ambulator and improve gait ability    Baseline 11/2: unable to tolerate this session    Time 8    Period Weeks    Status On-going    Target Date 03/16/20      PT LONG TERM GOAL #5   Title Patient will  increase BLE gross strength to 4+/5 as to improve functional strength for independent gait, increased standing tolerance and increased ADL ability.    Baseline 11/2: see note    Time 8    Period Weeks    Status On-going    Target Date 03/16/20                 Plan - 01/29/20 1107    Clinical Impression Statement  Pt was able to perform all exercises today with CGA.Marland Kitchen Pt was able to perform all balance and strength exercises.  Pt was able to complete  balance exercises, showing ability to stand on even surfaces with min assist and improve postural reactions to correct self during activities.  Pt requires verbal, visual and tactile cues during exercise in order to complete tasks with proper form and technique, as well as to stay on task.  Pt would continue to benefit from skilled PT services in order to further strengthen LE's, improve static and dynamic balance, and improve coordination in order to increase functional mobility and decrease risk of falls   Personal Factors and Comorbidities Age;Comorbidity 3+;Time since onset of injury/illness/exacerbation;Transportation    Comorbidities orthostatic hypotension, Parkinsons, GERD, anemia, lumbago, kidney stone, CAD, acute ischemic VBA thalamic stroke, lacunar stroke, HTN, aortic atherosclerosis, s/p Nissen fundoplication, ADD, history of stroke.    Examination-Activity Limitations Bed Mobility;Caring for Others;Carry;Continence;Dressing;Reach Overhead;Locomotion Level;Lift;Squat;Stairs;Stand;Toileting;Transfers    Examination-Participation Restrictions Church;Cleaning;Community Activity;Interpersonal Relationship;Meal Prep;Laundry;School;Shop;Volunteer;Yard Work    [employer]chant [employer] officer Evolving/Moderate  complexity    Rehab Potential Fair    PT Frequency 2x / week    PT Duration 8 weeks    PT Treatment/Interventions ADLs/Self Care Home Management;Aquatic Therapy;Biofeedback;Canalith Repostioning;Moist Heat;Cryotherapy;DME Instruction;Gait training;Stair training;Functional mobility training;Therapeutic activities;Therapeutic exercise;Balance training;Neuromuscular re-education;Patient/family education;Manual techniques;Taping;Energy conservation;Dry needling;Passive range of motion;Vestibular;Visual/perceptual remediation/compensation    PT Next Visit Plan slowly progress standing activity within tolerance, monitor symptoms and BP    PT Home Exercise Plan Reviewed, modified to accomodate strength impairment.    Consulted and Agree with Plan of Care Patient           Patient will benefit from skilled therapeutic intervention in order to improve the following deficits and impairments:  Abnormal gait, Cardiopulmonary status limiting activity, Decreased activity tolerance, Decreased coordination, Decreased balance, Decreased endurance, Decreased safety awareness, Decreased mobility, Decreased strength, Difficulty walking, Impaired flexibility, Impaired perceived functional ability, Postural dysfunction, Improper body mechanics, Pain  Visit Diagnosis: Other abnormalities of gait and mobility  Unsteadiness on feet  Muscle weakness (generalized)     Problem List Patient Active Problem List   Diagnosis Date Noted  . 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 CAD in native artery 05/29/2018  . Anemia, unspecified 10/16/2017  . Kidney stone 10/16/2017  . 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 Essential hypertension   . Atypical chest pain   . Chest pain 03/04/2017  . 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 Lacunar stroke 05/14/2015  . Elevated troponin 01/22/2015  . Esophageal stricture 11/04/2014  . GERD (gastroesophageal reflux disease) 11/04/2014  . Zenker's  hypopharyngeal diverticulum 09/01/2014  . 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 Hand pain 05/01/2013  . Sebaceous cyst of breast 12/20/2011  . S/P Nissen fundoplication (without gastrostomy tube) procedure 06/08/2011    Arelia Sneddon S,PT DPT 01/29/2020, 11:09 AM  [place] MAIN [facility] SERVICES [address] [place], [place], [address] Phone: [phone]   Fax:  [phone]  Name: Stephanie Bell MRN: [id number] Date of  Birth: Oct 06, 1940

## 2020-02-03 ENCOUNTER — Ambulatory Visit: Payer: PPO

## 2020-02-05 ENCOUNTER — Ambulatory Visit: Payer: PPO | Admitting: Physical Therapy

## 2020-02-05 DIAGNOSIS — H0288A Meibomian gland dysfunction right eye, upper and lower eyelids: Secondary | ICD-10-CM | POA: Diagnosis not present

## 2020-02-05 DIAGNOSIS — H25013 Cortical age-related cataract, bilateral: Secondary | ICD-10-CM | POA: Diagnosis not present

## 2020-02-05 DIAGNOSIS — H0288B Meibomian gland dysfunction left eye, upper and lower eyelids: Secondary | ICD-10-CM | POA: Diagnosis not present

## 2020-02-05 DIAGNOSIS — H04123 Dry eye syndrome of bilateral lacrimal glands: Secondary | ICD-10-CM | POA: Diagnosis not present

## 2020-02-10 ENCOUNTER — Ambulatory Visit: Payer: PPO | Admitting: Physical Therapy

## 2020-02-17 ENCOUNTER — Ambulatory Visit: Payer: PPO

## 2020-02-19 ENCOUNTER — Ambulatory Visit: Payer: PPO

## 2020-02-24 ENCOUNTER — Ambulatory Visit: Payer: PPO

## 2020-02-26 ENCOUNTER — Ambulatory Visit: Payer: PPO

## 2020-03-02 ENCOUNTER — Ambulatory Visit: Payer: PPO

## 2020-03-04 ENCOUNTER — Ambulatory Visit: Payer: PPO

## 2020-03-09 ENCOUNTER — Ambulatory Visit: Payer: PPO | Admitting: Podiatry

## 2020-03-09 ENCOUNTER — Encounter: Payer: Self-pay | Admitting: Podiatry

## 2020-03-09 ENCOUNTER — Ambulatory Visit: Payer: PPO

## 2020-03-09 ENCOUNTER — Other Ambulatory Visit: Payer: Self-pay

## 2020-03-09 DIAGNOSIS — I700 Atherosclerosis of aorta: Secondary | ICD-10-CM | POA: Insufficient documentation

## 2020-03-09 DIAGNOSIS — L603 Nail dystrophy: Secondary | ICD-10-CM

## 2020-03-09 NOTE — Progress Notes (Signed)
Subjective:  Patient ID: Bishop Limbo, female    DOB: Aug 08, 1940,  MRN: [id number] HPI Chief Complaint  Patient presents with  . Nail Problem    Hallux bilateral (L>R) - thick, discolored nails, tried OTC med (no help)     79 y.o. female presents with the above complaint.   ROS: Denies fever chills nausea vomiting muscle aches pains calf pain back pain chest pain shortness of breath.  Past Medical History:  Diagnosis Date  . ADD (attention deficit disorder)   . Anemia   . Esophageal stricture 11/04/2014  . GERD (gastroesophageal reflux disease)   . H/O hiatal hernia   . Hx: UTI (urinary tract infection)   . Interstitial cystitis   . Kidney stone   . Lumbago   . Palpitations 02/2017  . Paraesophageal hernia   . Parkinson's disease ([place]) 07/2019  . Pneumonia 05-09-11   2011 last time  . Stroke ([place])   . Weight loss   . Wound disruption, post-op, skin   . 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 BACK SURGERY  05-09-11   x 2 lumbar  . CHOLECYSTECTOMY  05-09-11   laparoscopic  . COLONOSCOPY  2002, 2008    rectal hyperplastic polyp 2002, diverticulosis 2008  . ESOPHAGOGASTRODUODENOSCOPY    . HERNIA REPAIR  05/16/2011   hiatal hernia  . HIATAL HERNIA REPAIR  02/09/2012   Procedure: LAPAROSCOPIC REPAIR OF HIATAL HERNIA;  Surgeon: Pedro Earls, MD;  Location: [facility];  Service: General;  Laterality: N/A;  . INSERTION OF MESH  02/09/2012   Procedure: INSERTION OF MESH;  Surgeon: Pedro Earls, MD;  Location: [facility];  Service: General;;  . IRRIGATION AND DEBRIDEMENT SEBACEOUS CYST  05/16/2011   Procedure: IRRIGATION AND DEBRIDEMENT SEBACEOUS CYST;  Surgeon: Pedro Earls, MD;  Location: [facility];  Service: General;  Laterality: N/A;  middle of chest on sternum  . LAPAROSCOPIC NISSEN FUNDOPLICATION  0000000   Procedure: LAPAROSCOPIC NISSEN FUNDOPLICATION;  Surgeon: Pedro Earls, MD;  Location: [facility];   Service: General;  Laterality: N/A;  will also remove sebaceous cyst of chest wall  . LAPAROSCOPIC NISSEN FUNDOPLICATION  [id number]   Procedure: LAPAROSCOPIC NISSEN FUNDOPLICATION;  Surgeon: Pedro Earls, MD;  Location: [facility];  Service: General;  Laterality: N/A;  Re-do Laparoscopic Nissen   . SEPTOPLASTY    . TONSILLECTOMY  05-09-11   Tonsillectomy  . UMBILICAL HERNIA REPAIR  05/16/2011   Procedure: HERNIA REPAIR UMBILICAL ADULT;  Surgeon: Pedro Earls, MD;  Location: [facility];  Service: General;  Laterality: N/A;  with mesh   . UPPER GASTROINTESTINAL ENDOSCOPY    . VAGINAL HYSTERECTOMY      Current Outpatient Medications:  .  aspirin EC 81 MG tablet, Take 81 mg by mouth daily., Disp: , Rfl:  .  carbidopa-levodopa (SINEMET IR) 25-100 MG tablet, Take 1/2 tablet my mouth 2-3 times per day (4 hours apart)., Disp: 135 tablet, Rfl: 0 .  Cholecalciferol (VITAMIN D3) 5000 UNITS CAPS, Take 5,000 Units by mouth once a week. , Disp: , Rfl:  .  Cranberry-Vitamin C 84-20 MG CAPS, Take 2 capsules by mouth daily., Disp: , Rfl:  .  Cyanocobalamin (VITAMIN B-12 PO), Take 1,000 mcg by mouth daily. , Disp: , Rfl:  .  methylphenidate (RITALIN) 10 MG tablet, Take 10 mg by mouth daily as needed., Disp: , Rfl:  .  nystatin ointment (MYCOSTATIN), Apply topically., Disp: , Rfl:  .  ondansetron (ZOFRAN ODT) 4 MG disintegrating tablet, Dissolve 1 tablet on the tongue every 6 hours as needed for nausea., Disp: 60 tablet, Rfl: 6 .  sertraline (ZOLOFT) 25 MG tablet, Take 25 mg by mouth daily., Disp: , Rfl:  .  trimethoprim (TRIMPEX) 100 MG tablet, Take 100 mg by mouth daily., Disp: , Rfl:   Allergies  Allergen Reactions  . Benzocaine Palpitations    Specific agent was Scandonest 2 %  . Epinephrine Hcl (Nasal) Palpitations and Other (See Comments)    Reaction:  Tachycardia   . Omeprazole Diarrhea  . Sulfa Antibiotics Swelling and Rash   Review of Systems Objective:  There were no vitals filed for this  visit.  General: Well developed, nourished, in no acute distress, alert and oriented x3   Dermatological: Skin is warm, dry and supple bilateral. Nails x 10 are well maintained; remaining integument appears unremarkable at this time. There are no open sores, no preulcerative lesions, no rash or signs of infection present.  Hallux nail plate right demonstrates dystrophic nail with subungual debris brittle and friable.  Nontender.  Vascular: Dorsalis Pedis artery and Posterior Tibial artery pedal pulses are 2/4 bilateral with immedate capillary fill time. Pedal hair growth present. No varicosities and no lower extremity edema present bilateral.   Neruologic: Grossly intact via light touch bilateral. Vibratory intact via tuning fork bilateral. Protective threshold with Semmes Wienstein monofilament intact to all pedal sites bilateral. Patellar and Achilles deep tendon reflexes 2+ bilateral. No Babinski or clonus noted bilateral.   Musculoskeletal: No gross boney pedal deformities bilateral. No pain, crepitus, or limitation noted with foot and ankle range of motion bilateral. Muscular strength 5/5 in all groups tested bilateral.  Gait: Unassisted, Nonantalgic.    Radiographs:  None taken  Assessment & Plan:   Assessment: Nail dystrophy hallux left  Plan: Samples of the skin and nail were taken today to be sent for pathologic evaluation she is interested in laser therapy I will follow-up with her in 1 month     Ranbir Chew T. [place], [place]

## 2020-03-10 DIAGNOSIS — B351 Tinea unguium: Secondary | ICD-10-CM | POA: Diagnosis not present

## 2020-03-10 DIAGNOSIS — L603 Nail dystrophy: Secondary | ICD-10-CM | POA: Diagnosis not present

## 2020-03-11 ENCOUNTER — Ambulatory Visit: Payer: PPO

## 2020-03-15 DIAGNOSIS — J069 Acute upper respiratory infection, unspecified: Secondary | ICD-10-CM | POA: Diagnosis not present

## 2020-03-15 DIAGNOSIS — Z20828 Contact with and (suspected) exposure to other viral communicable diseases: Secondary | ICD-10-CM | POA: Diagnosis not present

## 2020-03-16 ENCOUNTER — Ambulatory Visit: Payer: PPO

## 2020-03-25 DIAGNOSIS — R0982 Postnasal drip: Secondary | ICD-10-CM | POA: Diagnosis not present

## 2020-03-25 DIAGNOSIS — R059 Cough, unspecified: Secondary | ICD-10-CM | POA: Diagnosis not present

## 2020-04-07 ENCOUNTER — Encounter: Payer: Self-pay | Admitting: Podiatry

## 2020-04-07 ENCOUNTER — Other Ambulatory Visit: Payer: Self-pay

## 2020-04-07 ENCOUNTER — Ambulatory Visit: Payer: PPO | Admitting: Podiatry

## 2020-04-07 DIAGNOSIS — L603 Nail dystrophy: Secondary | ICD-10-CM | POA: Diagnosis not present

## 2020-04-07 NOTE — Progress Notes (Signed)
She presents today for follow-up of her pathology regarding her hallux nail left.  She denies fever chills nausea muscle aches and pains.  Objective: Pathology results demonstrate Trichophyton.  Assessment: Onychomycosis.  Plan: We discussed laser and oral therapy today however since she is on Sinemet I am not overly eager to give her terbinafine at this point.  She will talk to her neurologist about it and see if it is okay.  I did encourage laser therapy in our [place] office.  She will let us know as to what she wants to do.

## 2020-04-08 ENCOUNTER — Encounter: Payer: Self-pay | Admitting: Neurology

## 2020-04-08 ENCOUNTER — Ambulatory Visit: Payer: PPO | Admitting: Neurology

## 2020-04-08 VITALS — BP 133/86 | HR 73 | Ht 65.0 in | Wt 164.0 lb

## 2020-04-08 DIAGNOSIS — G20 Parkinson's disease: Secondary | ICD-10-CM

## 2020-04-08 DIAGNOSIS — R251 Tremor, unspecified: Secondary | ICD-10-CM | POA: Diagnosis not present

## 2020-04-08 NOTE — Progress Notes (Signed)
[facility]    Provider:  Dr Jaynee Eagles Referring Provider: [place] Pink, MD Primary Care Physician:  [place] Pink, MD  CC:  parkinson's disease  04/08/2020: Here for follow up. Today we discussed her symptoms and reviewed her past, she doesn't feel she has more memory problems than an 80 year should, she has to write down appointments and review schedule daily. We discussed driving and her reflexes may be reduced, she only goes to the beauty shop and places that are close by. She has excellent vision. Her night vision is not as good, I encouraged her not to drive at night and stay just a few miles from home to familiar locations. The Sinemet is helping. We had suggested a DAT scan in the past when her symptoms were milder, today she brings up the DAT Scan and wants to make sure she has PD and not essential tremor and we can order a DAT Scan. No falls. No postural symptoms. She uses a walker at home. She uses a cane outside the home and this works fine. She has a slight resting tremor ont he left, she states it is only because she is under stress. They declined PT at home for exercise. Not hypophonic. She was having severe orthostatic hypotension and now she can take the Sinemet without a problem. She talks in her sleep and can talk and hold a conversation in her sleep. Not acting out dreams. She has decreased smell and taste. No problems swallowing. No dyskinesias.   10/02/2019: Patient is here with her husband, this is the first time I have met her husband, we revieiwed patient's workup, history, I answered any questions and we discussed the diagnosis of parkinson's disease and I provided much information for patient and husband, we discussed exercise and reviewed lots of exercise options for PD and how important that is, patient states Feels like the Sinemet is really helping with tremor, no tremors since taking it, she feels her walking is k, she still uses a cane. She went to a  urologist a few months ago and she was having constipation problems, she was given miralax for 3 days and then a dulcolax. She is taking 1/2 the dose of miralax and since then she has been fine without miralax. She had a hiatal hernia and she had surgery and did great but coughed and it came loose and then she had the surgery again, she had an episode last May and she had a dry tablet and was eating breakfast and it was coming back up but this is not new, she threw up and no more incidents since then she tries to make sure she doesn't eat anything dry, she hashad to learn but this week she had some sticking in her throat with a dry tablet again and then she had some sticking and she spit up a little bit, she has slowed down and it is better and pill crusher helps. She had a swallow test earlier this year, and she has had a swallow test and follows with her GI doctor regularly and is monitoring, no falls, husband says he doesn't let her carry the groceries and he laughs and says he doesn't make her mow the lawn either and laughs. Husband has placed some bars around the house for safety and she now has a vaccuuming robot. She will use a cane or a rolling walker.  She does not have sleep apnea, she does not snore, husband doesn't seem to feel she is  very active in her sleep but she does have vivid dreams and had them for years but appears to be better the last several years. She is on daily medication now for UTIs for 6 months Trimethorprin and following for that with urology. No dyskinesias.   Interval history June 30, 2019: Patient was seen in the past for thalamic stroke and tremor, we did note she had some mild parkinsonian symptoms at last appointment in 2019(she never followed up with [place]), we discussed further testing and a DaTscan at that time she agreed to the [place] but then changed her mind and declined, since I last saw her in 2019 she has been diagnosed with Parkinson's disease and she has already seen  a neurologist.  She saw Dr. 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  MRI of the brain was ordered as well as vitamin B12 and vitamin D, she was started on carbidopa levodopa 3 times a day, MiraLAX Dulcolax and enema for constipation, she was talked to at length about her UTI, patient has a long history of UTIs.  Patient is here alone, As mentioned above, we saw her 2 years ago and recommend DatScan at that time which she declined but we suspected early parkinson's disease. In the 2 years(she never followed up) her symptoms have progressed and she is on sinemet currently see by another neurologist. She has left resting tremor, worsening, she had one fall but she denies falling more often, she is walking slowly, she reports vivid dreams. She doesn't feel her memory is as good (she doesn't remember meeting me in the past or [place] talking about her parkinson's symptoms). She has a son who is POA he lives in [place], other son lives in [place], daughter has done a lot of research on parkinson's disease she she has lots of support.   Patient complains of symptoms per HPI as well as the following symptoms: tremors . Pertinent negatives and positives per HPI. All others negative   Interval history 04/30/2017: Patient is here for follow-up of tremor.  Her last appointment we discussed that she had some mild parkinsonian symptoms.  Discussed the differential which includes Parkinson's disease.  The options were to start medication, follow clinically, or perform a DaTscan.  At that time she wanted to perform the DaTscan but she changed her mind.  Today we discussed parkinsonism in all the different reasons.  She continues to have tremors, resting and action.  Discussed  Parkinson's disease, also essential tremor.  Discussed medication management and Parkinson's disease including dopamine, dopamine agonists and others.  At this time she decided that she like to follow clinically which I agree with.  We will see her back in 6 months.  Otherwise she is doing excellent, she is playing tennis.   Interval history 12/25/2016; She has noticed tremor in her left hand noticed it first in may when driving and more recently when holding something heavy, she notices it at rest as well. She has some stress in her life. Happens every day, No caffeine. She is on aspirin. No tremor in the family. No FHx of Parkinson's. No slowing, no shuffling, no falls so far. No hallucinations or delusions. She reports poor memory. She uses a walker at home, she says she has slowed down.   HPI:  Laityn  ADJA RUFF is a lovely 80 y.o. female here as a referral from Dr. Kary Kos for thalamic stroke. She has a past medical history of hypertension, hyperlipidemia, no diabetes. She presented to the hospital on February 24 after waking up with right foot and leg numbness. She also reported weakness. No other neurologic deficits. No aphasia, no dysphagia, no dysarthria, no other weakness or numbness, no altered mentation. Her husband gave her to aspirin may went to the emergency room. She had an MRI of the brain which was positive for stroke.  Sheis feeling much better. Hisband here and provides information as well. No more numbness and no more weakness. She feels scared that she had a stroke, doesn't understand why or where in the brain she had it. Never had a stroke before. There were no inciting events, she just woke up. No FHx of stroke.  Reviewed notes, labs and imaging from outside physicians, which showed:  ldl 138. hgba1c wnl  Personally reviewed images with patient and her husband and agree with the following:  MRI HEAD FINDINGS  Calvarium and upper cervical spine: No focal marrow signal  abnormality.  Orbits: Negative.  Sinuses and Mastoids: Mucosal thickening on the left, overall mild. No effusion.  Brain: Ovoid 1 cm area of restricted diffusion in the lateral left thalamus, extending into the posterior limb internal capsule. No hemorrhagic conversion. No other territory of acute infarct. No evidence of major vessel occlusion.  Elsewhere, microvascular ischemic change is mild/expected for age. No previous infarct. No chronic hemorrhagic foci.  MRA HEAD FINDINGS  Symmetric carotid and vertebral arteries. Intact circle Willis with small anterior communicating artery.  Symmetric vertebrobasilar branching. Symmetric poor signal in distal vertebral and proximal basilar arteries is considered artifactual. These vessels have normal signal on T2 weighted conventional imaging.  Atherosclerotic type narrowings:  Distal left M1 segment high-grade tandem stenoses.  Moderate right proximal M2 segment stenosis.  Bilateral M3 narrowing, potentially overestimated due to artifact (poor signal in the bilateral A2 segments appears artifactual).  Bilateral atherosclerotic irregularity of the proximal PCA.   IMPRESSION: 1. 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 2. No acute arterial finding. 3. Intracranial atherosclerosis as described. No asymmetric disease in the proximal left PCA to correlate with #1.  Ultrasound carotids with no hemodynamically significant stenosis.  Review of Systems: Patient complains of symptoms per HPI as well as the following symptoms: tremor . Pertinent negatives and positives per HPI. All others negative   Social History   Socioeconomic History   Marital status: Married    Spouse name: [name]   Number of children: 3   Years of education: College   Highest education level: Not on file  Occupational History   Not on file  Tobacco Use   Smoking status: Never Smoker   Smokeless tobacco: Never Used   Vaping Use   Vaping Use: Never used  Substance and Sexual Activity   Alcohol use: Yes    Comment: occasionally has a glass of wine at night    Drug use: No   Sexual activity: Yes    Birth control/protection: Post-menopausal  Other Topics Concern   Not on file  Social History Narrative   Lives with husband.   Caffeine use: occasional coke   Right handed   Enjoys tennis   3 children    no tobacco or drug use   Occasional glass of wine at night    Social Determinants of Health   Financial Resource Strain: Not on file  Food Insecurity: Not on file  Transportation Needs: Not on file  Physical Activity: Not on file  Stress: Not on file  Social Connections: Not on file  Intimate Partner Violence: Not on file    Family History  Problem Relation Age of Onset   Ulcers Father    Alcohol abuse Father    Alzheimer's disease Mother    Stroke Mother    Diabetes Mother    Bipolar disorder Mother    Ulcers Paternal Grandmother    Colon cancer Neg Hx    Rectal cancer Neg Hx    Stomach cancer Neg Hx    Esophageal cancer Neg Hx     Past Medical History:  Diagnosis Date   ADD (attention deficit disorder)    Anemia    Esophageal stricture 11/04/2014   GERD (gastroesophageal reflux disease)    H/O hiatal hernia    Hx: UTI (urinary tract infection)    Interstitial cystitis    Kidney stone    Lumbago    Palpitations 02/2017   Paraesophageal hernia    Parkinson's disease ([place]) 07/2019   Pneumonia 05-09-11   2011 last time   Stroke [facility])    Weight loss    Wound disruption, post-op, skin    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    Past Surgical History:  Procedure Laterality Date   BACK SURGERY  05-09-11   x 2 lumbar   CHOLECYSTECTOMY  05-09-11   laparoscopic   COLONOSCOPY  2002, 2008    rectal hyperplastic polyp 2002, diverticulosis 2008   ESOPHAGOGASTRODUODENOSCOPY     HERNIA REPAIR  05/16/2011   hiatal hernia   HIATAL  HERNIA REPAIR  02/09/2012   Procedure: LAPAROSCOPIC REPAIR OF HIATAL HERNIA;  Surgeon: Pedro Earls, MD;  Location: [facility];  Service: General;  Laterality: N/A;   INSERTION OF MESH  02/09/2012   Procedure: INSERTION OF MESH;  Surgeon: Pedro Earls, MD;  Location: [facility];  Service: General;;   IRRIGATION AND DEBRIDEMENT SEBACEOUS CYST  05/16/2011   Procedure: IRRIGATION AND DEBRIDEMENT SEBACEOUS CYST;  Surgeon: Pedro Earls, MD;  Location: [facility];  Service: General;  Laterality: N/A;  middle of chest on sternum   LAPAROSCOPIC NISSEN FUNDOPLICATION  8/92/1194   Procedure: LAPAROSCOPIC NISSEN FUNDOPLICATION;  Surgeon: Pedro Earls, MD;  Location: [facility];  Service: General;  Laterality: N/A;  will also remove sebaceous cyst of chest wall   LAPAROSCOPIC NISSEN FUNDOPLICATION  17/40/8144   Procedure: LAPAROSCOPIC NISSEN FUNDOPLICATION;  Surgeon: Pedro Earls, MD;  Location: [facility];  Service: General;  Laterality: N/A;  Re-do Laparoscopic Nissen    SEPTOPLASTY     TONSILLECTOMY  05-09-11   Tonsillectomy   UMBILICAL HERNIA REPAIR  05/16/2011   Procedure: HERNIA REPAIR UMBILICAL ADULT;  Surgeon: Pedro Earls, MD;  Location: [facility];  Service: General;  Laterality: N/A;  with mesh    UPPER GASTROINTESTINAL ENDOSCOPY     VAGINAL HYSTERECTOMY      Current Outpatient Medications  Medication Sig Dispense Refill   aspirin EC 81 MG tablet Take 81 mg by mouth daily.     carbidopa-levodopa (SINEMET IR) 25-100 MG tablet Take 1/2 tablet my mouth 2-3 times per day (4 hours apart). (Patient taking differently: 1 tablet daily. Take 1/2 tablet my mouth 2-3 times per day (4 hours apart).) 135 tablet 0   Cholecalciferol (VITAMIN D3) 5000 UNITS CAPS Take 5,000 Units by mouth once a week.      Cranberry-Vitamin C 84-20 MG  CAPS Take 2 capsules by mouth daily.     Cyanocobalamin (VITAMIN B-12 PO) Take 1,000 mcg by mouth daily.      methylphenidate (RITALIN) 10 MG tablet Take 10 mg by  mouth daily as needed.     nystatin ointment (MYCOSTATIN) Apply topically.     ondansetron (ZOFRAN ODT) 4 MG disintegrating tablet Dissolve 1 tablet on the tongue every 6 hours as needed for nausea. 60 tablet 6   sertraline (ZOLOFT) 25 MG tablet Take 25 mg by mouth daily.     trimethoprim (TRIMPEX) 100 MG tablet Take 100 mg by mouth daily.     No current facility-administered medications for this visit.    Allergies as of 04/08/2020 - Review Complete 04/08/2020  Allergen Reaction Noted   Benzocaine Palpitations 05/24/2015   Epinephrine hcl (nasal) Palpitations and Other (See Comments) 02/21/2015   Omeprazole Diarrhea 03/26/2018   Sulfa antibiotics Swelling and Rash 02/22/2011    Vitals: BP 133/86 (BP Location: Right Arm, Patient Position: Sitting)    Pulse 73    Ht 5' 5" (1.651 m)    Wt 164 lb (74.4 kg)    BMI 27.29 kg/m  Last Weight:  Wt Readings from Last 1 Encounters:  04/08/20 164 lb (74.4 kg)   Last Height:   Ht Readings from Last 1 Encounters:  04/08/20 5' 5" (1.651 m)   Physical exam: stable Exam: Gen: NAD, conversant, tangential, poor historian           Eyes: Conjunctivae clear without exudates or hemorrhage  Neuro: stable Detailed Neurologic Exam  Speech:    Speech is normal; fluent and spontaneous with slightly impaired comprehension but tangential, poor concentration.  Cognition:    The patient is oriented to person, place, and time; remote and recent history impaired.    Cranial Nerves:    The pupils are equal, round, and reactive to light. Mild masked facies. Visual fields are full to threat. Extraocular movements are intact. Trigeminal sensation is intact and the muscles of mastication are normal. The face is symmetric. The palate elevates in the midline. Hearing intact. Voice is normal. Shoulder shrug is normal. The tongue has normal motion without fasciculations.   Motor Observation: resting tremor left arm which is improved on Sinemet  Gait:  shuffling, decreased left arm swing with re-emergent tremor improved today     Tone:    cogwheeling left arm , improved  Posture:    Stooped slightly    Strength:    Strength is V/V in the upper and lower limbs.      Sensation: intact to LT  Right upgoing toe, left down, brisk reflexes throughout.  Assessment/Plan:  80 year old here for follow up of Parkinson;s disease here with her husband, extended visit for family and patient education and answering of all questions, reviewing history. Patient was seen in the past for thalamic stroke and tremor, we did note she had some mild parkinsonian symptoms  in 2019, we discussed likely early Parkinson's disease and further testing and a DaTscan at that time she agreed to the [place] but then changed her mind, since I last saw her in 2019 she progressed and now she is on Sinemet and reports improvement.  - We had suggested a DAT scan in the past when her symptoms were milder, today she brings up the DAT Scan and wants to make sure she has PD and not essential tremor and we can order a DAT Scan she is insistent however I do not think  it will change management. Discussed medications she should stop prior to her scan.  - She declined going to parkinson's Education officer, museum at [employer] neurology, discussed again with husband, they decline - Continue Sinemet: caused severe orthostatic hypotension 1 daily she cannot tolerate more keep away from protein can eat with crackers if causes nausea. She was having severe orthostatic hypotension with Sinemet tid, now she takes the Sinemet once a day and she feels it helps her tremor and not orthostatic.  - Get regular skin checks, PD associated with skn cancer, discussed again - Stopped phenergan may make symptoms worse - Fall risk, discussed fall precautions - provided much Parkinson's information again, local groups, resources which we reviewed extensively and I answered all questions, I reviewed classes focused on PD  and exercise, support groups, I encouraged they attend, exercise can slow down PD progression - Physical Therapy: For Parkinson's disease, gait abnormality she went a few times and then decided to do the exercises at home, she did not return.  - Suggested In-home PT again today SHE DECLINED - appears to have poor memory, poor historian, tangential - she declined MMSE at this visit again, will try again next visit and maybe discuss [place] but she and her husband denies any symptoms so difficult to get her to comply with an MMSE and discuss - Had family meeting with husband last appointment, he is here again today, reviewed, they both appear to have poor memories on her history and how we diagnosed PD (Also saw Dr. Manuella Ghazi at [facility] who dxed PD)  PRIOR:  This is a lovely 80 year old female with hypertension and hyperlipidemia who [facility] with a nonhemorrhagic lacunar infarct in the left thalamus due to small vessel disease. She was on aspirin at home. ldl 138. hgba1c wnl. MRA did show significant atherosclerotic disease. Discussed with patient and husband and reviewed images.  Lacunar infarct: I had a long d/w patient about her stroke, risk for recurrent stroke/TIAs, personally independently reviewed imaging studies and stroke evaluation results and answered questions.Continue [id number] for secondary stroke prevention and maintain strict control of hypertension with blood pressure goal below 130/90, diabetes with hemoglobin A1c goal below 6.5% and lipids with LDL cholesterol goal below 70 mg/dL. I also advised the patient to eat a healthy diet with plenty of whole grains, cereals, fruits and vegetables, exercise regularly and maintain ideal body weight .  Patient's LDL is elevated, she declines cholesterol medication, encouraged getting LDL less than 70.  Tremor: She is here for follow-up of resting and action tremor. Exam shows resting course tremor, no action tremor  visible or postural tremor. Exam does show some mild stooped posture, and gait with low clearance, narrow gait, small strides but not classically shuffling. May be the onset of parkinson'd disease. Gave patient the option to just observe for now, also DAT scan or medication.  We will continue to follow clinically.  Sarina Ill, MD  [facility] [address] [place], [place] [address]  I spent over 70 minutes of face-to-face and non-face-to-face time with patient on the  1. Parkinson's disease ([place])   2. Tremor    diagnosis.  This included previsit chart review, lab review, study review, order entry, electronic health record documentation, patient education on the different diagnostic and therapeutic options, counseling and coordination of care, risks and benefits of management, compliance, or risk factor reduction

## 2020-04-08 NOTE — Patient Instructions (Signed)
We will order a DAT Scan Continue current medications  Brain DaTscan How to prepare and what to expect ?????????????????????????????????????What is a brain DaTscan? A brain DaTscan is a nuclear medicine scan. It uses radioactive material to diagnose some diseases of the brain, especially those that cause tremor (shakiness). DaTscan is a brand name for a drug called ioflupane I-123. A brain DaTscan is a form of radiology, because radiation is used to take pictures of the body. This radioactive drug is ordered especially for you. Because of this, we need at least 72 hours' notice if you must cancel or reschedule your scan.   How does the scan work? You will be given a small dose of tracer (radioactive material) through an intravenous (IV) line. This tracer will collect in part of your brain and give off gamma rays. A special camera called a gamma camera will use these rays to produce pictures and measurements of your brain. How do I prepare? Some drugs will affect the results of your brain DaTscan. You will need to stop taking these drugs before your scan. The table on page 2 lists the drugs that need to be stopped, and for how many days before your scan. This list is in alphabetical order by the generic name of the drug. The common brand names are listed beneath the generic name. Please confirm these instructions with your doctor who prescribed the drug. Drugs to Stop Taking Before your scan, stop taking these medicines for the length of time shown: Name of Drug Stop Taking  Amoxapine 4 days before  Benztropine  Cogentin 3 days before  Bupropion (Aplenzin, Budeprion, Voxra, Wellbutrin, Zyban) 48 hours before  Buspirone 15 hours before  Citalopram 24 hours before  Cocaine 6 hours before  Escitalopram 24 hours before  Methamphetamine 24 hours before  Methylphenidate (Concerta, Metadate, Methylin, Ritalin) 20 hours before  Paroxetine 24 hours before  Selegilene 48 hours before  Sertraline  3 days before  If you are breastfeeding, or if there is any chance you are pregnant, please tell the scheduler or technologist (the person who will help you prepare for your scan). How is the scan done? When you first arrive, we will ask you to drink a small cup of water with potassium iodine in it. This water may have a metallic taste.  An hour after you drink the potassium iodine water, the technologist will inject a small amount of tracer into a vein in your arm or hand through your IV.  You must stay in the department for 30 minutes after the injection.  You will then have a break for 3 hours. It is OK to eat and drink during this break.  You must return to the clinic after this 3-hour break to have images of your brain taken.  Then, 4 hours after you receive your tracer injection, the technologist will take images of your brain with the gamma camera. You will lie flat on the exam table while these images are being taken.  You must not move while the camera is taking pictures. If you move, the pictures will be blurry and may have to be taken again.  Taking the images will take 40 to 45 minutes. Your total time in the imaging room will be about 1 hour.  You may also have a low-dose CT scan of your brain to help confirm any results. A CT scan is another way to take images inside your body.  It will take about 5 hours from the time  you drink the potassium iodine water until the scans are complete. What will I feel during the scan? The technologist will help make you as comfortable as possible on the exam table for the scan.  You may feel some minor discomfort from the IV.  Lying still on the exam table may be hard for some patients.  The camera will be close to your head. This may make you feel confined or uneasy (claustrophobic). Please tell the doctor who referred you for this scan if you know you are claustrophobic. Are there any side effects from the scan? Most of the radioactivity from the  tracer will pass out of your body in your urine or stool. The rest simply goes away over time.  Bad reactions to this scan are very rare. Fewer than 1% of patients (fewer than 1 out of 100) have a bad reaction. Reactions may include headache, nausea, vertigo (dizziness), or dry mouth. How do I get the results? When the test is over, the nuclear medicine doctor will review your images, prepare a written report, and talk with your doctor about the results. Your doctor will then talk with you about the results and your treatment options. If you needed to stop taking any medicines on the day of your scan, ask your doctor when to start taking them again.  The potentially interfering drugs consist of: amoxapine, amphetamine, benztropine, bupropion, buspirone, citalopram, cocaine, mazindol, methamphetamine, methylphenidate, norephedrine, phentermine, escitalopram, phenylpropanolamine, selegiline, paroxetine, and sertraline

## 2020-04-12 ENCOUNTER — Telehealth: Payer: Self-pay | Admitting: Neurology

## 2020-04-12 NOTE — Telephone Encounter (Signed)
04/12/2020 Had to Mail Patient DAT Scan Consent for her to sign Thanks Hinton Dyer

## 2020-04-21 DIAGNOSIS — H903 Sensorineural hearing loss, bilateral: Secondary | ICD-10-CM | POA: Diagnosis not present

## 2020-04-28 NOTE — Telephone Encounter (Signed)
DAT Scan processing Auth . Thanks Hinton Dyer

## 2020-05-03 ENCOUNTER — Telehealth: Payer: Self-pay | Admitting: Neurology

## 2020-05-03 NOTE — Telephone Encounter (Signed)
See MyChart message

## 2020-05-17 ENCOUNTER — Other Ambulatory Visit: Payer: Self-pay

## 2020-05-17 ENCOUNTER — Encounter: Payer: Self-pay | Admitting: Physician Assistant

## 2020-05-17 ENCOUNTER — Ambulatory Visit: Payer: PPO | Admitting: Physician Assistant

## 2020-05-17 VITALS — BP 113/70 | HR 81 | Ht 65.0 in | Wt 160.0 lb

## 2020-05-17 DIAGNOSIS — N390 Urinary tract infection, site not specified: Secondary | ICD-10-CM | POA: Diagnosis not present

## 2020-05-17 DIAGNOSIS — R3915 Urgency of urination: Secondary | ICD-10-CM

## 2020-05-17 LAB — MICROSCOPIC EXAMINATION: Bacteria, UA: NONE SEEN

## 2020-05-17 LAB — URINALYSIS, COMPLETE
Bilirubin, UA: NEGATIVE
Glucose, UA: NEGATIVE
Ketones, UA: NEGATIVE
Leukocytes,UA: NEGATIVE
Nitrite, UA: POSITIVE — AB
Protein,UA: NEGATIVE
RBC, UA: NEGATIVE
Specific Gravity, UA: 1.025 (ref 1.005–1.030)
Urobilinogen, Ur: 4 mg/dL — ABNORMAL HIGH (ref 0.2–1.0)
pH, UA: 6 (ref 5.0–7.5)

## 2020-05-17 LAB — BLADDER SCAN AMB NON-IMAGING: Scan Result: 1

## 2020-05-17 MED ORDER — MIRABEGRON ER 25 MG PO TB24: 25.0000 mg | ORAL_TABLET | Freq: Every day | ORAL | 0 refills | Status: DC

## 2020-05-17 NOTE — Progress Notes (Signed)
05/17/2020 5:10 PM   Stephanie Bell 10-18-1940 [id number]  CC: Chief Complaint  Patient presents with  . Recurrent UTI    HPI: Stephanie Bell is a 80 y.o. female with a history of recurrent UTI and Parkinson's disease who presents today for evaluation of possible UTI.   Today she reports having completed a 2-month course of suppressive trimethoprim several months ago.  She has not had any urinary tract infection since.  She is on daily cranberry supplements and occasionally uses topical vaginal estrogen cream.  Today she reports a 1 week history of urinary urgency with slight burning with initiation.  She denies dysuria, frequency, or the typical "electric sensation" in her upper extremities that she experiences with UTI.  She is concerned that her increased urinary urgency may represent an impending UTI.  She has been taking Azo and reports this slightly improves her symptoms.  Additionally, she reports wearing pads all the time for management of urge incontinence.  She also reports mild stress incontinence that is minimally bothersome for her.  In-office UA today positive for orange color, 4.0 EU/dL urobilinogen, and nitrites; urine microscopy pan-negative. PVR 74mL.  PMH: Past Medical History:  Diagnosis Date  . ADD (attention deficit disorder)   . Anemia   . Esophageal stricture 11/04/2014  . GERD (gastroesophageal reflux disease)   . H/O hiatal hernia   . Hx: UTI (urinary tract infection)   . Interstitial cystitis   . Kidney stone   . Lumbago   . Palpitations 02/2017  . Paraesophageal hernia   . Parkinson's disease ([place]) 07/2019  . Pneumonia 05-09-11   2011 last time  . Stroke ([place])   . Weight loss   . Wound disruption, post-op, skin   . 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 BACK SURGERY  05-09-11   x 2 lumbar  . CHOLECYSTECTOMY  05-09-11   laparoscopic  . COLONOSCOPY  2002,  2008    rectal hyperplastic polyp 2002, diverticulosis 2008  . ESOPHAGOGASTRODUODENOSCOPY    . HERNIA REPAIR  05/16/2011   hiatal hernia  . HIATAL HERNIA REPAIR  02/09/2012   Procedure: LAPAROSCOPIC REPAIR OF HIATAL HERNIA;  Surgeon: Pedro Earls, MD;  Location: [facility];  Service: General;  Laterality: N/A;  . INSERTION OF MESH  02/09/2012   Procedure: INSERTION OF MESH;  Surgeon: Pedro Earls, MD;  [facility];  Service: General;;  . IRRIGATION AND DEBRIDEMENT SEBACEOUS CYST  05/16/2011   Procedure: IRRIGATION AND DEBRIDEMENT SEBACEOUS CYST;  Surgeon: Pedro Earls, MD;  [facility]: [facility];  Service: General;  Laterality: N/A;  middle of chest on sternum  . LAPAROSCOPIC NISSEN FUNDOPLICATION  5/69/7948   Procedure: LAPAROSCOPIC NISSEN FUNDOPLICATION;  Surgeon: Pedro Earls, MD;  [facility]: [facility];  Service: General;  Laterality: N/A;  will also remove sebaceous cyst of chest wall  . LAPAROSCOPIC NISSEN FUNDOPLICATION  01/65/5374   Procedure: LAPAROSCOPIC NISSEN FUNDOPLICATION;  Surgeon: Pedro Earls, MD;  [facility];  Service: General;  Laterality: N/A;  Re-do Laparoscopic Nissen   . SEPTOPLASTY    . TONSILLECTOMY  05-09-11   Tonsillectomy  . UMBILICAL HERNIA REPAIR  05/16/2011   Procedure: HERNIA REPAIR UMBILICAL ADULT;  Surgeon: Pedro Earls, MD;  [facility];  Service: General;  Laterality: N/A;  with mesh   . UPPER GASTROINTESTINAL ENDOSCOPY    . VAGINAL HYSTERECTOMY      Home Medications:  Allergies as of 05/17/2020      Reactions   Benzocaine Palpitations   Specific agent was Scandonest 2 %   Epinephrine Hcl (nasal) Palpitations, Other (See Comments)   Reaction:  Tachycardia    Omeprazole Diarrhea   Sulfa Antibiotics Swelling, Rash      Medication List       Accurate as of May 17, 2020  5:10 PM. If you have any questions, ask your nurse or doctor.        aspirin EC 81 MG tablet Take 81 mg by mouth daily.   carbidopa-levodopa  25-100 MG tablet Commonly known as: SINEMET IR Take 1/2 tablet my mouth 2-3 times per day (4 hours apart). What changed:   how much to take  when to take this   Cranberry-Vitamin C 84-20 MG Caps Take 2 capsules by mouth daily.   methylphenidate 10 MG tablet Commonly known as: RITALIN Take 10 mg by mouth daily as needed.   mirabegron ER 25 MG Tb24 tablet Commonly known as: MYRBETRIQ Take 1 tablet (25 mg total) by mouth daily. Started by: Debroah Loop, PA-C   nystatin ointment Commonly known as: MYCOSTATIN Apply topically.   ondansetron 4 MG disintegrating tablet Commonly known as: Zofran ODT Dissolve 1 tablet on the tongue every 6 hours as needed for nausea.   sertraline 25 MG tablet Commonly known as: ZOLOFT Take 25 mg by mouth daily.   trimethoprim 100 MG tablet Commonly known as: TRIMPEX Take 100 mg by mouth daily.   VITAMIN B-12 PO Take 1,000 mcg by mouth daily.   Vitamin D3 125 MCG (5000 UT) Caps Take 5,000 Units by mouth once a week.       Allergies:  Allergies  Allergen Reactions  . Benzocaine Palpitations    Specific agent was Scandonest 2 %  . Epinephrine Hcl (Nasal) Palpitations and Other (See Comments)    Reaction:  Tachycardia   . Omeprazole Diarrhea  . Sulfa Antibiotics Swelling and Rash    Family History: Family History  Problem Relation Age of Onset  . Ulcers Father   . Alcohol abuse Father   . Alzheimer's disease Mother   . Stroke Mother   . Diabetes Mother   . Bipolar disorder Mother   . Ulcers Paternal Grandmother   . Colon cancer Neg Hx   . Rectal cancer Neg Hx   . Stomach cancer Neg Hx   . Esophageal cancer Neg Hx     Social History:   reports that she has never smoked. She has never used smokeless tobacco. She reports current alcohol use. She reports that she does not use drugs.  Physical Exam: BP 113/70   Pulse 81   Ht 5\' 5"  (1.651 m)   Wt 160 lb (72.6 kg)   BMI 26.63 kg/m   Constitutional:  Alert and  oriented, no acute distress, nontoxic appearing HEENT: [place], AT Cardiovascular: No clubbing, cyanosis, or edema Respiratory: Normal respiratory effort, no increased work of breathing Skin: No rashes, bruises or suspicious lesions Neurologic: Grossly intact, no focal deficits, moving all 4 extremities Psychiatric: Normal mood and affect  Laboratory Data: Results for orders placed or performed in visit on 05/17/20  Microscopic Examination   Urine  Result Value Ref Range   WBC, UA 0-5 0 - 5 /hpf   RBC 0-2 0 - 2 /hpf   Epithelial Cells (non renal) 0-10 0 - 10 /hpf   Renal Epithel, UA 0-10 (A) None seen /hpf   Bacteria, UA None seen  None seen/Few  Urinalysis, Complete  Result Value Ref Range   Specific Gravity, UA 1.025 1.005 - 1.030   pH, UA 6.0 5.0 - 7.5   Color, UA Orange Yellow   Appearance Ur Hazy (A) Clear   Leukocytes,UA Negative Negative   Protein,UA Negative Negative/Trace   Glucose, UA Negative Negative   Ketones, UA Negative Negative   RBC, UA Negative Negative   Bilirubin, UA Negative Negative   Urobilinogen, Ur 4.0 (H) 0.2 - 1.0 mg/dL   Nitrite, UA Positive (A) Negative   Microscopic Examination See below:   BLADDER SCAN AMB NON-IMAGING  Result Value Ref Range   Scan Result 1 ml    Assessment & Plan:   1. Recurrent UTI UA benign today.  Will send for culture for further evaluation but defer therapy in the absence of irritative voiding symptoms. - Urinalysis, Complete - CULTURE, URINE COMPREHENSIVE  2. Urinary urgency Given her recent Parkinson's diagnosis, I suspect she is developing overactive bladder.  I offered her a trial of Myrbetriq 25 mg daily x1 month.  We will plan for symptom recheck and PVR upon completion.  Patient is in agreement this plan. - BLADDER SCAN AMB NON-IMAGING - mirabegron ER (MYRBETRIQ) 25 MG TB24 tablet; Take 1 tablet (25 mg total) by mouth daily.  Dispense: 28 tablet; Refill: 0  Return in about 4 weeks (around 06/14/2020) for Symptom  recheck with PVR.  Debroah Loop, PA-C  [facility] [address] [place] [address] [place] [address], [place] [address] [phone]

## 2020-05-20 ENCOUNTER — Telehealth: Payer: Self-pay | Admitting: Neurology

## 2020-05-20 LAB — CULTURE, URINE COMPREHENSIVE

## 2020-05-20 NOTE — Telephone Encounter (Signed)
Called and spoke to spoke to Patient about Details of DAT scan and where she needed to go . Called Stephanie Bell from [facility] [phone] conference call and she she answered all her questions . Patient will call back if she needs anything else . Thanks Hinton Dyer

## 2020-05-21 ENCOUNTER — Telehealth: Payer: Self-pay | Admitting: Physician Assistant

## 2020-05-25 NOTE — Telephone Encounter (Signed)
Error

## 2020-05-26 ENCOUNTER — Encounter: Payer: Self-pay | Admitting: Neurology

## 2020-06-03 ENCOUNTER — Encounter (HOSPITAL_COMMUNITY)
Admission: RE | Admit: 2020-06-03 | Discharge: 2020-06-03 | Disposition: A | Discharge status: Home / Self Care | Payer: PPO | Source: Ambulatory Visit | Attending: Neurology | Admitting: Neurology

## 2020-06-03 ENCOUNTER — Other Ambulatory Visit: Payer: Self-pay

## 2020-06-03 ENCOUNTER — Ambulatory Visit (HOSPITAL_COMMUNITY)
Admission: RE | Admit: 2020-06-03 | Discharge: 2020-06-03 | Disposition: A | Discharge status: Home / Self Care | Payer: PPO | Source: Ambulatory Visit | Attending: Neurology | Admitting: Neurology

## 2020-06-03 DIAGNOSIS — G20 Parkinson's disease: Secondary | ICD-10-CM | POA: Diagnosis not present

## 2020-06-03 DIAGNOSIS — R251 Tremor, unspecified: Secondary | ICD-10-CM | POA: Insufficient documentation

## 2020-06-03 DIAGNOSIS — R269 Unspecified abnormalities of gait and mobility: Secondary | ICD-10-CM | POA: Diagnosis not present

## 2020-06-03 MED ORDER — IOFLUPANE I 123 185 MBQ/2.5ML IV SOLN
4.8000 | Freq: Once | INTRAVENOUS | Status: AC | PRN
  Administered 2020-06-03: 4.8 via INTRAVENOUS
  Filled 2020-06-03: qty 5

## 2020-06-03 MED ORDER — POTASSIUM IODIDE (ANTIDOTE) 130 MG PO TABS
ORAL_TABLET | ORAL | Status: AC
  Administered 2020-06-03: 130 mg via ORAL
  Filled 2020-06-03: qty 1

## 2020-06-03 MED ORDER — POTASSIUM IODIDE (ANTIDOTE) 130 MG PO TABS: 130.0000 mg | ORAL_TABLET | Freq: Once | ORAL | Status: AC

## 2020-06-07 ENCOUNTER — Telehealth: Payer: Self-pay | Admitting: Neurology

## 2020-06-07 NOTE — Telephone Encounter (Signed)
Spoke with the patient and discussed the results of the DAT scan.  I was able to schedule patient for sooner appointment on Wednesday, May 25 at 2:00 for 1 hour.

## 2020-06-07 NOTE — Telephone Encounter (Signed)
DAT scan is positive for a parkinsonian disorder. We have a followup appointment in July, happy to see you sooner to discuss more. Dr. Jaynee Eagles  Written by Melvenia Beam, MD on 06/05/2020  2:03 PM EDT  These results were sent to the pt's mychart on 3/19.

## 2020-06-07 NOTE — Telephone Encounter (Signed)
Pt called, checking on status of results on my scan.  Would like a call from the nurse.  Informed Pt when physician received results nurse will call with those results. Pt verbalized understood.

## 2020-06-08 DIAGNOSIS — H2513 Age-related nuclear cataract, bilateral: Secondary | ICD-10-CM | POA: Diagnosis not present

## 2020-06-08 DIAGNOSIS — H25013 Cortical age-related cataract, bilateral: Secondary | ICD-10-CM | POA: Diagnosis not present

## 2020-06-08 DIAGNOSIS — H5203 Hypermetropia, bilateral: Secondary | ICD-10-CM | POA: Diagnosis not present

## 2020-06-08 DIAGNOSIS — H524 Presbyopia: Secondary | ICD-10-CM | POA: Diagnosis not present

## 2020-06-08 DIAGNOSIS — H0288A Meibomian gland dysfunction right eye, upper and lower eyelids: Secondary | ICD-10-CM | POA: Diagnosis not present

## 2020-06-08 DIAGNOSIS — H52223 Regular astigmatism, bilateral: Secondary | ICD-10-CM | POA: Diagnosis not present

## 2020-06-14 ENCOUNTER — Encounter: Payer: Self-pay | Admitting: Physician Assistant

## 2020-06-14 ENCOUNTER — Other Ambulatory Visit: Payer: Self-pay

## 2020-06-14 ENCOUNTER — Ambulatory Visit: Payer: PPO | Admitting: Physician Assistant

## 2020-06-14 VITALS — BP 115/76 | HR 71 | Ht 65.0 in | Wt 164.0 lb

## 2020-06-14 DIAGNOSIS — R3915 Urgency of urination: Secondary | ICD-10-CM | POA: Diagnosis not present

## 2020-06-14 DIAGNOSIS — N390 Urinary tract infection, site not specified: Secondary | ICD-10-CM

## 2020-06-14 LAB — BLADDER SCAN AMB NON-IMAGING: Scan Result: 5

## 2020-06-14 MED ORDER — MIRABEGRON ER 25 MG PO TB24: 25.0000 mg | ORAL_TABLET | Freq: Every day | ORAL | 11 refills | Status: DC

## 2020-06-14 NOTE — Progress Notes (Signed)
06/14/2020 1:22 PM   Stephanie Bell Oct 08, 1940 [id number]  CC: Chief Complaint  Patient presents with  . Urinary Retention   HPI: Stephanie Bell is a 80 y.o. female with PMH recurrent UTI, urinary urgency, and Parkinson's disease who presents today for symptom recheck on Myrbetriq 25 mg daily.  She is accompanied today by her husband, who contributes to HPI.  Today she reports improvement in her urinary urgency and decreased urge incontinence on Myrbetriq.  Overall she is pleased with her progress on this medication. PVR 93mL.  Since her last clinic visit, she underwent DAT scan with decreased striatal activity consistent with parkinsonian syndromes.  PMH: Past Medical History:  Diagnosis Date  . ADD (attention deficit disorder)   . Anemia   . Esophageal stricture 11/04/2014  . GERD (gastroesophageal reflux disease)   . H/O hiatal hernia   . Hx: UTI (urinary tract infection)   . Interstitial cystitis   . Kidney stone   . Lumbago   . Palpitations 02/2017  . Paraesophageal hernia   . Parkinson's disease ([place]) 07/2019  . Pneumonia 05-09-11   2011 last time  . Stroke ([place])   . Weight loss   . Wound disruption, post-op, skin   . 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 BACK SURGERY  05-09-11   x 2 lumbar  . CHOLECYSTECTOMY  05-09-11   laparoscopic  . COLONOSCOPY  2002, 2008    rectal hyperplastic polyp 2002, diverticulosis 2008  . ESOPHAGOGASTRODUODENOSCOPY    . HERNIA REPAIR  05/16/2011   hiatal hernia  . HIATAL HERNIA REPAIR  02/09/2012   Procedure: LAPAROSCOPIC REPAIR OF HIATAL HERNIA;  Surgeon: Pedro Earls, MD;  [facility];  Service: General;  Laterality: N/A;  . INSERTION OF MESH  02/09/2012   Procedure: INSERTION OF MESH;  Surgeon: Pedro Earls, MD;  [facility];  Service: General;;  . IRRIGATION AND DEBRIDEMENT SEBACEOUS CYST  05/16/2011   Procedure:  IRRIGATION AND DEBRIDEMENT SEBACEOUS CYST;  Surgeon: Pedro Earls, MD;  [facility];  Service: General;  Laterality: N/A;  middle of chest on sternum  . LAPAROSCOPIC NISSEN FUNDOPLICATION  12/07/1658   Procedure: LAPAROSCOPIC NISSEN FUNDOPLICATION;  Surgeon: Pedro Earls, MD;  [facility];  Service: General;  Laterality: N/A;  will also remove sebaceous cyst of chest wall  . LAPAROSCOPIC NISSEN FUNDOPLICATION  60/06/5995   Procedure: LAPAROSCOPIC NISSEN FUNDOPLICATION;  Surgeon: Pedro Earls, MD;  [facility];  Service: General;  Laterality: N/A;  Re-do Laparoscopic Nissen   . SEPTOPLASTY    . TONSILLECTOMY  05-09-11   Tonsillectomy  . UMBILICAL HERNIA REPAIR  05/16/2011   Procedure: HERNIA REPAIR UMBILICAL ADULT;  Surgeon: Pedro Earls, MD;  [facility];  Service: General;  Laterality: N/A;  with mesh   . UPPER GASTROINTESTINAL ENDOSCOPY    . VAGINAL HYSTERECTOMY      Home Medications:  Allergies as of 06/14/2020      Reactions   Benzocaine Palpitations   Specific agent was Scandonest 2 %   Epinephrine Hcl (nasal) Palpitations, Other (See Comments)   Reaction:  Tachycardia    Omeprazole Diarrhea   Sulfa Antibiotics Swelling, Rash      Medication List       Accurate as of June 14, 2020  1:22 PM. If you have any questions, ask your nurse or doctor.  aspirin EC 81 MG tablet Take 81 mg by mouth daily.   carbidopa-levodopa 25-100 MG tablet Commonly known as: SINEMET IR Take 1/2 tablet my mouth 2-3 times per day (4 hours apart). What changed:   how much to take  when to take this   Cranberry-Vitamin C 84-20 MG Caps Take 2 capsules by mouth daily.   methylphenidate 10 MG tablet Commonly known as: RITALIN Take 10 mg by mouth daily as needed.   mirabegron ER 25 MG Tb24 tablet Commonly known as: MYRBETRIQ Take 1 tablet (25 mg total) by mouth daily.   nystatin ointment Commonly known as: MYCOSTATIN Apply topically.   ondansetron  4 MG disintegrating tablet Commonly known as: Zofran ODT Dissolve 1 tablet on the tongue every 6 hours as needed for nausea.   sertraline 25 MG tablet Commonly known as: ZOLOFT Take 25 mg by mouth daily.   trimethoprim 100 MG tablet Commonly known as: TRIMPEX Take 100 mg by mouth daily.   VITAMIN B-12 PO Take 1,000 mcg by mouth daily.   Vitamin D3 125 MCG (5000 UT) Caps Take 5,000 Units by mouth once a week.       Allergies:  Allergies  Allergen Reactions  . Benzocaine Palpitations    Specific agent was Scandonest 2 %  . Epinephrine Hcl (Nasal) Palpitations and Other (See Comments)    Reaction:  Tachycardia   . Omeprazole Diarrhea  . Sulfa Antibiotics Swelling and Rash    Family History: Family History  Problem Relation Age of Onset  . Ulcers Father   . Alcohol abuse Father   . Alzheimer's disease Mother   . Stroke Mother   . Diabetes Mother   . Bipolar disorder Mother   . Ulcers Paternal Grandmother   . Colon cancer Neg Hx   . Rectal cancer Neg Hx   . Stomach cancer Neg Hx   . Esophageal cancer Neg Hx     Social History:   reports that she has never smoked. She has never used smokeless tobacco. She reports current alcohol use. She reports that she does not use drugs.  Physical Exam: BP 115/76   Pulse 71   Ht 5\' 5"  (1.651 m)   Wt 164 lb (74.4 kg)   BMI 27.29 kg/m   Constitutional:  Alert, no acute distress, nontoxic appearing HEENT: [place], AT Cardiovascular: No clubbing, cyanosis, or edema Respiratory: Normal respiratory effort, no increased work of breathing Skin: No rashes, bruises or suspicious lesions Neurologic: Grossly intact, no focal deficits, moving all 4 extremities Psychiatric: Normal mood and affect  Laboratory Data: Results for orders placed or performed in visit on 06/14/20  Bladder Scan (Post Void Residual) in office  Result Value Ref Range   Scan Result 5    Assessment & Plan:   1. Urinary urgency Improved on Myrbetriq 25 mg  daily.  Will refill this for 1 year.  Patient is in agreement with this plan. - Bladder Scan (Post Void Residual) in office - mirabegron ER (MYRBETRIQ) 25 MG TB24 tablet; Take 1 tablet (25 mg total) by mouth daily.  Dispense: 30 tablet; Refill: 11  Return if symptoms worsen or fail to improve.  Debroah Loop, PA-C  [facility] [address], [place], [place] [address] [phone]

## 2020-07-02 ENCOUNTER — Telehealth: Payer: Self-pay | Admitting: Neurology

## 2020-07-02 MED ORDER — CARBIDOPA-LEVODOPA 25-100 MG PO TABS: ORAL_TABLET | ORAL | 0 refills | Status: DC

## 2020-07-02 NOTE — Telephone Encounter (Signed)
The patient currently is on Sinemet taking the 25/100 mg tablets, 1/2 tablet 3 times daily.  In the morning, she is having some difficulty "getting going" initially.  I will have her go up on the first tablet today taking 1 full tablet.  Previously, when she was taking 1 full tablet 3 times daily, she was having some problems with orthostatic hypotension.  She will need to watch out for this, she does check her blood pressure at home, usually runs in the 709 systolic range.

## 2020-08-02 ENCOUNTER — Telehealth: Payer: Self-pay | Admitting: Neurology

## 2020-08-02 NOTE — Telephone Encounter (Signed)
I called patient to discuss. Patient did not know where she needed to go for her appointment with [facility]. I provided her with the telephone number [phone]) and advised her to call for directions, etc.

## 2020-08-02 NOTE — Telephone Encounter (Signed)
Pt is asking for a call to discuss the appointment she has for tomorrow at [facility] re: her Parkinson's .

## 2020-08-03 DIAGNOSIS — G20 Parkinson's disease: Secondary | ICD-10-CM | POA: Diagnosis not present

## 2020-08-09 ENCOUNTER — Telehealth: Payer: Self-pay | Admitting: Neurology

## 2020-08-09 NOTE — Telephone Encounter (Signed)
I spoke with Rise Paganini and let her know we have the DAT scan results and there is nothing she needs to bring to the appointment and she was very thankful.

## 2020-08-09 NOTE — Telephone Encounter (Signed)
Pt is asking for a call from RN to discuss her upcoming appointment.  Pt states she did have her DAT scan but she does not recall where it was and what she needs to bring to the upcoming appointment, please call.

## 2020-08-09 NOTE — Telephone Encounter (Signed)
Tori, do you mind calling patient and letting her know we have her DAT scan results already and she does not need to bring anything to the appointment.

## 2020-08-11 ENCOUNTER — Encounter: Payer: Self-pay | Admitting: Neurology

## 2020-08-11 ENCOUNTER — Ambulatory Visit: Payer: PPO | Admitting: Neurology

## 2020-08-11 VITALS — BP 136/90 | HR 77 | Ht 65.0 in | Wt 166.0 lb

## 2020-08-11 DIAGNOSIS — G20 Parkinson's disease: Secondary | ICD-10-CM

## 2020-08-11 MED ORDER — CARBIDOPA-LEVODOPA 25-100 MG PO TABS: ORAL_TABLET | ORAL | 3 refills | Status: DC

## 2020-08-11 NOTE — Progress Notes (Signed)
[facility]    Provider:  Dr Jaynee Eagles Referring Provider: [place] Pink, MD Primary Care Physician:  [place] Pink, MD  CC:  parkinson's disease  08/11/2020; DAT Scan was consistent with parkinson's disease. We discussed today. 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 She declines PT, I advised her to stay active. Given her orthostatic hypotension, increasing sinemet may pose a problem, Since a dosage increase in the future is most likely inevitable, Sinemet may create challenges per [facility]. Since she was unable to tolerate compression stockings, counseling regarding adequate hydration, abdominal binder, medications such as midodrine or fludricortisone may all be needed and we discussed that today during the appointment. No swallowing difficulties, no falls at home, discussed fall risks. She feels stable. Tremors are not worsening. Not dropping things. We will continue to follow her and if needed in the future she can go back to [facility] if treatment presents a problem for [place] and her.   DAT Scan 06/03/20: reviewed images Asymmetric decreased striatal Ioflupane activity as above. Loss of activity is greater on the RIGHT than the LEFT. This pattern can be seen in Parkinsonian syndromes  04/08/2020: Here for follow up. Today we discussed her symptoms and reviewed her past, she doesn't feel she has more memory problems than an 79 year should, she has to write down appointments and review schedule daily. We discussed driving and her reflexes may be reduced, she only goes to the beauty shop and places that are close by. She has excellent vision. Her night vision is not as good, I encouraged her not to drive at night and stay just a few miles from home to familiar locations. The Sinemet is helping. We had suggested a DAT scan in the past when her symptoms were milder, today she brings up the DAT Scan and wants to make sure she has PD and not  essential tremor and we can order a DAT Scan. No falls. No postural symptoms. She uses a walker at home. She uses a cane outside the home and this works fine. She has a slight resting tremor ont he left, she states it is only because she is under stress. They declined PT at home for exercise. Not hypophonic. She was having severe orthostatic hypotension and now she can take the Sinemet without a problem. She talks in her sleep and can talk and hold a conversation in her sleep. Not acting out dreams. She has decreased smell and taste. No problems swallowing. No dyskinesias.   10/02/2019: Patient is here with her husband, this is the first time I have met her husband, we revieiwed patient's workup, history, I answered any questions and we discussed the diagnosis of parkinson's disease and I provided much information for patient and husband, we discussed exercise and reviewed lots of exercise options for PD and how important that is, patient states Feels like the Sinemet is really helping with tremor, no tremors since taking it, she feels her walking is k, she still uses a cane. She went to a urologist a few months ago and she was having constipation problems, she was given miralax for 3 days and then a dulcolax. She is taking 1/2 the dose of miralax and since then she has been fine without miralax. She had a hiatal hernia and she had surgery and did great but coughed and it came loose and then she had the surgery again, she had an episode last May and she had a dry tablet  and was eating breakfast and it was coming back up but this is not new, she threw up and no more incidents since then she tries to make sure she doesn't eat anything dry, she hashad to learn but this week she had some sticking in her throat with a dry tablet again and then she had some sticking and she spit up a little bit, she has slowed down and it is better and pill crusher helps. She had a swallow test earlier this year, and she has had a  swallow test and follows with her GI doctor regularly and is monitoring, no falls, husband says he doesn't let her carry the groceries and he laughs and says he doesn't make her mow the lawn either and laughs. Husband has placed some bars around the house for safety and she now has a vaccuuming robot. She will use a cane or a rolling walker.  She does not have sleep apnea, she does not snore, husband doesn't seem to feel she is very active in her sleep but she does have vivid dreams and had them for years but appears to be better the last several years. She is on daily medication now for UTIs for 6 months Trimethorprin and following for that with urology. No dyskinesias.   Interval history June 30, 2019: Patient was seen in the past for thalamic stroke and tremor, we did note she had some mild parkinsonian symptoms at last appointment in 2019(she never followed up with [place]), we discussed further testing and a DaTscan at that time she agreed to the [place] but then changed her mind and declined, since I last saw her in 2019 she has been diagnosed with Parkinson's disease and she has already seen a neurologist.  She saw Dr. 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  MRI of the brain was ordered as well as vitamin B12 and vitamin D, she was started on carbidopa levodopa 3 times a day, MiraLAX Dulcolax and enema for constipation, she was talked to at length about her UTI, patient has a long history of UTIs.  Patient is here alone, As mentioned above, we saw her 2 years ago and recommend DatScan at that time which she declined but we suspected early parkinson's disease. In the 2 years(she never followed up) her symptoms have progressed and she is on sinemet currently  see by another neurologist. She has left resting tremor, worsening, she had one fall but she denies falling more often, she is walking slowly, she reports vivid dreams. She doesn't feel her memory is as good (she doesn't remember meeting me in the past or [place] talking about her parkinson's symptoms). She has a son who is POA he lives in [place], other son lives in [place], daughter has done a lot of research on parkinson's disease she she has lots of support.   Patient complains of symptoms per HPI as well as the following symptoms: tremors . Pertinent negatives and positives per HPI. All others negative   Interval history 04/30/2017: Patient is here for follow-up of tremor.  Her last appointment we discussed that she had some mild parkinsonian symptoms.  Discussed the differential which includes Parkinson's disease.  The options were to start medication, follow clinically, or perform a DaTscan.  At that time she wanted to perform  the DaTscan but she changed her mind.  Today we discussed parkinsonism in all the different reasons.  She continues to have tremors, resting and action.  Discussed Parkinson's disease, also essential tremor.  Discussed medication management and Parkinson's disease including dopamine, dopamine agonists and others.  At this time she decided that she like to follow clinically which I agree with.  We will see her back in 6 months.  Otherwise she is doing excellent, she is playing tennis.   Interval history 12/25/2016; She has noticed tremor in her left hand noticed it first in may when driving and more recently when holding something heavy, she notices it at rest as well. She has some stress in her life. Happens every day, No caffeine. She is on aspirin. No tremor in the family. No FHx of Parkinson's. No slowing, no shuffling, no falls so far. No hallucinations or delusions. She reports poor memory. She uses a walker at home, she says she has slowed down.   HPI:  Stephanie Bell is  a lovely 80 y.o. female here as a referral from Dr. Kary Kos for thalamic stroke. She has a past medical history of hypertension, hyperlipidemia, no diabetes. She presented to the hospital on February 24 after waking up with right foot and leg numbness. She also reported weakness. No other neurologic deficits. No aphasia, no dysphagia, no dysarthria, no other weakness or numbness, no altered mentation. Her husband gave her to aspirin may went to the emergency room. She had an MRI of the brain which was positive for stroke.  Sheis feeling much better. Hisband here and provides information as well. No more numbness and no more weakness. She feels scared that she had a stroke, doesn't understand why or where in the brain she had it. Never had a stroke before. There were no inciting events, she just woke up. No FHx of stroke.  Reviewed notes, labs and imaging from outside physicians, which showed:  ldl 138. hgba1c wnl  Personally reviewed images with patient and her husband and agree with the following:  MRI HEAD FINDINGS  Calvarium and upper cervical spine: No focal marrow signal abnormality.  Orbits: Negative.  Sinuses and Mastoids: Mucosal thickening on the left, overall mild. No effusion.  Brain: Ovoid 1 cm area of restricted diffusion in the lateral left thalamus, extending into the posterior limb internal capsule. No hemorrhagic conversion. No other territory of acute infarct. No evidence of major vessel occlusion.  Elsewhere, microvascular ischemic change is mild/expected for age. No previous infarct. No chronic hemorrhagic foci.  MRA HEAD FINDINGS  Symmetric carotid and vertebral arteries. Intact circle Willis with small anterior communicating artery.  Symmetric vertebrobasilar branching. Symmetric poor signal in distal vertebral and proximal basilar arteries is considered artifactual. These vessels have normal signal on T2 weighted conventional imaging.  Atherosclerotic  type narrowings:  Distal left M1 segment high-grade tandem stenoses.  Moderate right proximal M2 segment stenosis.  Bilateral M3 narrowing, potentially overestimated due to artifact (poor signal in the bilateral A2 segments appears artifactual).  Bilateral atherosclerotic irregularity of the proximal PCA.   IMPRESSION: 1. 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 2. No acute arterial finding. 3. Intracranial atherosclerosis as described. No asymmetric disease in the proximal left PCA to correlate with #1.  Ultrasound carotids with no hemodynamically significant stenosis.  Review of Systems: Patient complains of symptoms per HPI as well as the following symptoms: tremors, UTI Pertinent negatives and positives per HPI. All others negative    Social History  Socioeconomic History  . Marital status: Married    Spouse name: [name]  . Number of children: 3  . Years of education: College  . Highest education level: Not on file  Occupational History  . Not on file  Tobacco Use  . Smoking status: Never Smoker  . Smokeless tobacco: Never Used  Vaping Use  . Vaping Use: Never used  Substance and Sexual Activity  . Alcohol use: Yes    Comment: occasionally has a glass of wine at night   . Drug use: No  . Sexual activity: Yes    Birth control/protection: Post-menopausal  Other Topics Concern  . Not on file  Social History Narrative   Lives with husband.   Caffeine use: occasional coke   Right handed   Enjoys tennis   3 children    no tobacco or drug use   Occasional glass of wine at night    Social Determinants of Health   Financial Resource Strain: Not on file  Food Insecurity: Not on file  Transportation Needs: Not on file  Physical Activity: Not on file  Stress: Not on file  Social Connections: Not on file  Intimate Partner Violence: Not on file    Family History  Problem Relation Age of Onset  . Ulcers Father   . Alcohol abuse  Father   . Alzheimer's disease Mother   . Stroke Mother   . Diabetes Mother   . Bipolar disorder Mother   . Ulcers Paternal Grandmother   . Colon cancer Neg Hx   . Rectal cancer Neg Hx   . Stomach cancer Neg Hx   . Esophageal cancer Neg Hx     Past Medical History:  Diagnosis Date  . ADD (attention deficit disorder)   . Anemia   . Esophageal stricture 11/04/2014  . GERD (gastroesophageal reflux disease)   . H/O hiatal hernia   . Hx: UTI (urinary tract infection)   . Interstitial cystitis   . Kidney stone   . Lumbago   . Palpitations 02/2017  . Paraesophageal hernia   . Parkinson disease ([place])   . Parkinson's disease ([place]) 07/2019  . Pneumonia 05-09-11   2011 last time  . Stroke ([place])   . Weight loss   . Wound disruption, post-op, skin   . 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 BACK SURGERY  05-09-11   x 2 lumbar  . CHOLECYSTECTOMY  05-09-11   laparoscopic  . COLONOSCOPY  2002, 2008    rectal hyperplastic polyp 2002, diverticulosis 2008  . ESOPHAGOGASTRODUODENOSCOPY    . HERNIA REPAIR  05/16/2011   hiatal hernia  . HIATAL HERNIA REPAIR  02/09/2012   Procedure: LAPAROSCOPIC REPAIR OF HIATAL HERNIA;  Surgeon: Pedro Earls, MD;  Location: [facility];  Service: General;  Laterality: N/A;  . INSERTION OF MESH  02/09/2012   Procedure: INSERTION OF MESH;  Surgeon: Pedro Earls, MD;  Location: [facility];  Service: General;;  . IRRIGATION AND DEBRIDEMENT SEBACEOUS CYST  05/16/2011   Procedure: IRRIGATION AND DEBRIDEMENT SEBACEOUS CYST;  Surgeon: Pedro Earls, MD;  Location: [facility];  Service: General;  Laterality: N/A;  middle of chest on sternum  . LAPAROSCOPIC NISSEN FUNDOPLICATION  06/13/7122   Procedure: LAPAROSCOPIC NISSEN FUNDOPLICATION;  Surgeon: Pedro Earls, MD;  Location: [facility];  Service: General;  Laterality: N/A;  will also remove sebaceous cyst of chest wall  . LAPAROSCOPIC NISSEN FUNDOPLICATION   58/11/9831  Procedure: LAPAROSCOPIC NISSEN FUNDOPLICATION;  Surgeon: Pedro Earls, MD;  Location: [facility];  Service: General;  Laterality: N/A;  Re-do Laparoscopic Nissen   . SEPTOPLASTY    . TONSILLECTOMY  05-09-11   Tonsillectomy  . UMBILICAL HERNIA REPAIR  05/16/2011   Procedure: HERNIA REPAIR UMBILICAL ADULT;  Surgeon: Pedro Earls, MD;  Location: [facility];  Service: General;  Laterality: N/A;  with mesh   . UPPER GASTROINTESTINAL ENDOSCOPY    . VAGINAL HYSTERECTOMY      Current Outpatient Medications  Medication Sig Dispense Refill  . aspirin EC 81 MG tablet Take 81 mg by mouth daily.    . Cholecalciferol (VITAMIN D3) 5000 UNITS CAPS Take 5,000 Units by mouth once a week.     . Cranberry-Vitamin C 84-20 MG CAPS Take 2 capsules by mouth daily.    . Cyanocobalamin (VITAMIN B-12 PO) Take 1,000 mcg by mouth daily.     . methylphenidate (RITALIN) 10 MG tablet Take 10 mg by mouth daily as needed.    . mirabegron ER (MYRBETRIQ) 25 MG TB24 tablet Take 1 tablet (25 mg total) by mouth daily. 30 tablet 11  . nystatin ointment (MYCOSTATIN) Apply topically.    . ondansetron (ZOFRAN ODT) 4 MG disintegrating tablet Dissolve 1 tablet on the tongue every 6 hours as needed for nausea. 60 tablet 6  . sertraline (ZOLOFT) 25 MG tablet Take 25 mg by mouth daily.    . carbidopa-levodopa (SINEMET IR) 25-100 MG tablet 1 tablet in the morning, May also take 1/2 to 1 tablet at midday and in the evening 270 tablet 3   No current facility-administered medications for this visit.    Allergies as of 08/11/2020 - Review Complete 08/11/2020  Allergen Reaction Noted  . Benzocaine Palpitations 05/24/2015  . Epinephrine hcl (nasal) Palpitations and Other (See Comments) 02/21/2015  . Omeprazole Diarrhea 03/26/2018  . Sulfa antibiotics Swelling and Rash 02/22/2011    Vitals: BP 136/90 (BP Location: Left Arm, Patient Position: Sitting)   Pulse 77   Ht '5\' 5"'  (1.651 m)   Wt 166 lb (75.3 kg)   BMI 27.62  kg/m  Last Weight:  Wt Readings from Last 1 Encounters:  08/11/20 166 lb (75.3 kg)   Last Height:   Ht Readings from Last 1 Encounters:  08/11/20 '5\' 5"'  (1.651 m)   Physical exam: STABLE Exam: Gen: NAD, conversant, tangential, poor historian           Eyes: Conjunctivae clear without exudates or hemorrhage  Neuro: STABLE Detailed Neurologic Exam  Speech:    Speech is normal; fluent and spontaneous with slightly impaired comprehension but tangential, poor concentration.  Cognition:    The patient is oriented to person, place, and time; remote and recent history impaired.    Cranial Nerves:    The pupils are equal, round, and reactive to light. Mild masked facies. Visual fields are full to threat. Extraocular movements are intact. Trigeminal sensation is intact and the muscles of mastication are normal. The face is symmetric. The palate elevates in the midline. Hearing intact. Voice is normal. Shoulder shrug is normal. The tongue has normal motion without fasciculations.   Motor Observation: resting tremor left arm which is improved on Sinemet  Gait: shuffling, decreased left arm swing with re-emergent tremor improved today     Tone:    cogwheeling left arm , improved  Posture:    Stooped slightly    Strength:    Strength is V/V in the upper and lower  limbs.      Sensation: intact to LT  Right upgoing toe, left down, brisk reflexes throughout.  Assessment/Plan: Lovely patient 80 year old here for follow up of Parkinson;s disease here with her husband, extended visit for family and patient education and answering of all questions, reviewing history. Patient was seen in the past for thalamic stroke and tremor, we did note she had some mild parkinsonian symptoms  in 2019, we discussed likely early Parkinson's disease and further testing and a DaTscan at that time she agreed to the [place] but then changed her mind, since I last saw her in 2019 she progressed and now she is on  Sinemet and reports improvement.DAT scan consistent with parkinsonian disorder  - She had an initial appointment at [facility] Disorder team, Movement Disorder Specialist MS at [facility] diagnosed with likely idiopathic parkinson's Disease. I recommend she stay associated with them for her care, they are the best in the area, but we can see her in the meantime until needed to go back. she felt PT was a waste of time.   -  Given her orthostatic hypotension, increasing sinemet may pose a problem,   Since a dosage increase in the future is most likely inevitable, Sinemet may create challenges per [facility]. Since she was unable to tolerate compression stockings, counseling regarding adequate hydration, abdominal binder, medications such as midodrine or fludricortisone may all be needed.   - She declined going to parkinson's Education officer, museum at [employer] neurology, discussed again with husband, they decline  - Continue Sinemet: caused severe orthostatic hypotension but now doing well on 1 daily in the morning with 1/2 at mid day and evening - she cannot tolerate more, keep away from protein within 30 minutes of taking the pill, can eat with crackers if causes nausea. She was having severe orthostatic hypotension with Sinemet tid, doing better.  - Per [facility]: She did try compression hose and they were very challenging to get on. It felt like it was cutting her circulation off. She was custom-fitted for them. She doesn't think she was able to wear them long enough to know it was helpful for BP. She has an abdominal wrap that she used after her back surgery but not for the purpose of [place]. I also encouraged her to use the stockings and return to PT and stay active.   - Get regular skin checks, PD associated with skn cancer, discussed again  - She does not drive, discussed  - Stopped phenergan may make symptoms worse  - Fall risk, discussed fall precautions  - provided much Parkinson's  information again, local groups, resources which we reviewed extensively and I answered all questions, I reviewed classes focused on PD and exercise, support groups, I encouraged they attend, exercise can slow down PD progression  - Physical Therapy: For Parkinson's disease, gait abnormality she went a few times and then decided to do the exercises at home, she did not return.   - Suggested In-home PT again today SHE DECLINED  - appears to have poor memory, poor historian, tangential - she declined MMSE at this visit again, will try again next visit and maybe discuss [place] but she and her husband denies any symptoms so difficult to get her to comply with an MMSE and discuss - Had family meeting with husband two appointments ago, he is here again today, reviewed, they both appear to have poor memories on her history and how we diagnosed PD (Also saw Dr.  Manuella Ghazi at [facility] who dxed PD as did [facility])  Lacunar infarct: I had a long d/w patient about her stroke, risk for recurrent stroke/TIAs, personally independently reviewed imaging studies and stroke evaluation results and answered questions.Continue [id number] for secondary stroke prevention and maintain strict control of hypertension with blood pressure goal below 130/90, diabetes with hemoglobin A1c goal below 6.5% and lipids with LDL cholesterol goal below 70 mg/dL. I also advised the patient to eat a healthy diet with plenty of whole grains, cereals, fruits and vegetables, exercise regularly and maintain ideal body weight .  Patient's LDL is elevated, she declines cholesterol medication, encouraged getting LDL less than 70.  Meds ordered this encounter  Medications  . carbidopa-levodopa (SINEMET IR) 25-100 MG tablet    Sig: 1 tablet in the morning, May also take 1/2 to 1 tablet at midday and in the evening    Dispense:  270 tablet    Refill:  3    3 month supply. Cancel previous Sinemet RX.    Sarina Ill, MD  [facility] [address] [place], [place] [address]  I spent over 40 minutes of face-to-face and non-face-to-face time with patient on the  1. 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[place])    diagnosis.  This included previsit chart review, lab review, study review, order entry, electronic health record documentation, patient education on the different diagnostic and therapeutic options, counseling and coordination of care, risks and benefits of management, compliance, or risk factor reduction

## 2020-08-11 NOTE — Patient Instructions (Signed)
Continue Carbidopa/Levodopa   Parkinson's Disease Parkinson's disease causes problems with movements. It is a long-term condition. It gets worse over time (is progressive). It affects each person in different ways. It makes it harder for you to:  Control how your body moves.  Move your body normally. The condition can range from mild to very bad (advanced). What are the causes? This condition results from a loss of brain cells called neurons. These brain cells make a chemical called dopamine, which is needed to control body movement. As the condition gets worse, the brain cells make less dopamine. This makes it hard to move or control your movements. The exact cause of this condition is not known. What increases the risk?  Being female.  Being age 54 or older.  Having family members who had Parkinson's disease.  Having had an injury to the brain.  Being very sad (depressed).  Being around things that are harmful or poisonous. What are the signs or symptoms? Symptoms of this condition can vary. The main symptoms have to do with movement. These include:  A tremor or shaking while you are resting that you cannot control.  Stiffness in your neck, arms, and legs.  Slowing of movement. This may include: ? Losing expressions of the face. ? Having trouble making small movements that are needed to button your clothing or brush your teeth.  Walking in a way that is not normal. You may walk with short, shuffling steps.  Loss of balance when standing. You may sway, fall backward, or have trouble making turns. Other symptoms include:  Being very sad, worried, or confused.  Seeing or hearing things that are not real.  Losing thinking abilities (dementia).  Trouble speaking or swallowing.  Having a hard time pooping (constipation).  Needing to pee right away, peeing often, or not being able to control when you pee or poop.  Sleep problems.   How is this treated? There is no  cure. The goal of treatment is to manage your symptoms. Treatment may include:  Medicines.  Therapy to help with talking or movement.  Surgery to reduce shaking and other movements that you cannot control. Follow these instructions at home: Medicines  Take over-the-counter and prescription medicines only as told by your doctor.  Avoid taking pain or sleeping medicines. Eating and drinking  Follow instructions from your doctor about what you cannot eat or drink.  Do not drink alcohol. Activity  Talk with your doctor about if it is safe for you to drive.  Do exercises as told by your doctor. Lifestyle  Put in grab bars and railings in your home. These help to prevent falls.  Do not use any products that contain nicotine or tobacco, such as cigarettes, e-cigarettes, and chewing tobacco. If you need help quitting, ask your doctor.  Join a support group.      General instructions  Talk with your doctor about what you need help with and what your safety needs are.  Keep all follow-up visits as told by your doctor, including any therapy visits to help with talking or moving. This is important. Contact a doctor if:  Medicines do not help your symptoms.  You feel off-balance.  You fall at home.  You need more help at home.  You have trouble swallowing.  You have a very hard time pooping.  You have a lot of side effects from your medicines.  You feel very sad, worried, or confused. Get help right away if:  You were  hurt in a fall.  You see or hear things that are not real.  You cannot swallow without choking.  You have chest pain or trouble breathing.  You do not feel safe at home.  You have thoughts about hurting yourself or others. If you ever feel like you may hurt yourself or others, or have thoughts about taking your own life, get help right away. You can go to your nearest emergency department or call:  Your local emergency services (911 in the  [place]  A suicide crisis helpline, such as the [facility] at 843-303-0679. This is open 24 hours a day. Summary  This condition causes problems with movements.  It is a long-term condition. It gets worse over time.  There is no cure. Treatment focuses on managing your symptoms.  Talk with your doctor about what you need help with and what your safety needs are.  Keep all follow-up visits as told by your doctor. This is important. This information is not intended to replace advice given to you by your health care provider. Make sure you discuss any questions you have with your health care provider. Document Revised: 05/23/2018 Document Reviewed: 05/23/2018 Elsevier Patient Education  [place].

## 2020-09-30 ENCOUNTER — Ambulatory Visit: Payer: PPO | Admitting: Neurology

## 2020-10-07 ENCOUNTER — Ambulatory Visit: Payer: PPO | Admitting: Neurology

## 2020-12-02 ENCOUNTER — Other Ambulatory Visit: Payer: Self-pay

## 2020-12-02 ENCOUNTER — Encounter: Payer: Self-pay | Admitting: Physician Assistant

## 2020-12-02 ENCOUNTER — Ambulatory Visit (INDEPENDENT_AMBULATORY_CARE_PROVIDER_SITE_OTHER): Payer: PPO | Admitting: Physician Assistant

## 2020-12-02 VITALS — BP 105/68 | HR 83 | Ht 65.0 in | Wt 161.0 lb

## 2020-12-02 DIAGNOSIS — H25013 Cortical age-related cataract, bilateral: Secondary | ICD-10-CM | POA: Diagnosis not present

## 2020-12-02 DIAGNOSIS — N390 Urinary tract infection, site not specified: Secondary | ICD-10-CM

## 2020-12-02 DIAGNOSIS — R300 Dysuria: Secondary | ICD-10-CM | POA: Diagnosis not present

## 2020-12-02 DIAGNOSIS — H2513 Age-related nuclear cataract, bilateral: Secondary | ICD-10-CM | POA: Diagnosis not present

## 2020-12-02 NOTE — Progress Notes (Signed)
12/02/2020 2:01 PM   Stephanie Bell May 14, 1940 [id number]  CC: Chief Complaint  Patient presents with   Dysuria   HPI: Stephanie Bell is a 80 y.o. female with PMH recurrent UTI, urinary urgency on Myrbetriq 25 mg daily, and Parkinson's disease who presents today for evaluation of possible UTI.  She is accompanied today by her husband, who contributes to HPI.  Today she reports an approximate 1 month history of painful electrical sensations of her upper arms with urinating consistent with past UTIs.  She has been taking Azo urinary pain reliever, which significantly improved her symptoms.  She states she called to make this appointment earlier in the week, and ever since the electrical sensations have gone away.  She continues to have some urinary urgency worse than at baseline, but no pain.  In-office UA today positive for trace ketones, trace intact blood, trace protein, and 1+ leukocyte esterace; urine microscopy with >30 WBCs/HPF and many bacteria.   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    Surgical History: Past Surgical History:  Procedure Laterality Date   BACK SURGERY  05-09-11   x 2 lumbar   CHOLECYSTECTOMY  05-09-11   laparoscopic   COLONOSCOPY  2002, 2008    rectal hyperplastic polyp 2002, diverticulosis 2008   ESOPHAGOGASTRODUODENOSCOPY     HERNIA REPAIR  05/16/2011   hiatal hernia   HIATAL HERNIA REPAIR  02/09/2012   Procedure: LAPAROSCOPIC REPAIR OF HIATAL  HERNIA;  Surgeon: Pedro Earls, MD;  Location: [facility];  Service: General;  Laterality: N/A;   INSERTION OF MESH  02/09/2012   Procedure: INSERTION OF MESH;  Surgeon: Pedro Earls, MD;  Location: [facility];  Service: General;;   IRRIGATION AND DEBRIDEMENT SEBACEOUS CYST  05/16/2011   Procedure: IRRIGATION AND DEBRIDEMENT SEBACEOUS CYST;  Surgeon: Pedro Earls, MD;  Location: [facility];  Service: General;  Laterality: N/A;  middle of chest on sternum   LAPAROSCOPIC NISSEN FUNDOPLICATION  0000000   Procedure: LAPAROSCOPIC NISSEN FUNDOPLICATION;  Surgeon: Pedro Earls, MD;  Location: [facility];  Service: General;  Laterality: N/A;  will also remove sebaceous cyst of chest wall   LAPAROSCOPIC NISSEN FUNDOPLICATION  [id number]   Procedure: LAPAROSCOPIC NISSEN FUNDOPLICATION;  Surgeon: Pedro Earls, MD;  Location: [facility];  Service: General;  Laterality: N/A;  Re-do Laparoscopic Nissen    SEPTOPLASTY     TONSILLECTOMY  05-09-11   Tonsillectomy   UMBILICAL HERNIA REPAIR  05/16/2011   Procedure: HERNIA REPAIR UMBILICAL ADULT;  Surgeon: Pedro Earls, MD;  Location: [facility];  Service: General;  Laterality: N/A;  with mesh    UPPER GASTROINTESTINAL ENDOSCOPY     VAGINAL HYSTERECTOMY      Home Medications:  Allergies as of 12/02/2020       Reactions   Benzocaine Palpitations   Specific agent was Scandonest 2 %  Epinephrine Hcl (nasal) Palpitations, Other (See Comments)   Reaction:  Tachycardia    Other Other (See Comments), Palpitations   EPINEPHRINE Reaction:  Tachycardia    Omeprazole Diarrhea   Sulfa Antibiotics Swelling, Rash        Medication List        Accurate as of December 02, 2020  2:01 PM. If you have any questions, ask your nurse or doctor.          aspirin EC 81 MG tablet Take 81 mg by mouth daily.   carbidopa-levodopa 25-100 MG tablet Commonly known as: SINEMET IR 1 tablet in the morning, May also take 1/2 to 1 tablet at midday and in the evening    Cranberry-Vitamin C 84-20 MG Caps Take 2 capsules by mouth daily.   methylphenidate 10 MG tablet Commonly known as: RITALIN Take 10 mg by mouth daily as needed.   mirabegron ER 25 MG Tb24 tablet Commonly known as: MYRBETRIQ Take 1 tablet (25 mg total) by mouth daily.   nystatin ointment Commonly known as: MYCOSTATIN Apply topically.   ondansetron 4 MG disintegrating tablet Commonly known as: Zofran ODT Dissolve 1 tablet on the tongue every 6 hours as needed for nausea.   sertraline 25 MG tablet Commonly known as: ZOLOFT Take 25 mg by mouth daily.   VITAMIN B-12 PO Take 1,000 mcg by mouth daily.   Vitamin D3 125 MCG (5000 UT) Caps Take 5,000 Units by mouth once a week.        Allergies:  Allergies  Allergen Reactions   Benzocaine Palpitations    Specific agent was Scandonest 2 %   Epinephrine Hcl (Nasal) Palpitations and Other (See Comments)    Reaction:  Tachycardia    Other Other (See Comments) and Palpitations    EPINEPHRINE Reaction:  Tachycardia    Omeprazole Diarrhea   Sulfa Antibiotics Swelling and Rash    Family History: Family History  Problem Relation Age of Onset   Ulcers Father    Alcohol abuse Father    Alzheimer's disease Mother    Stroke Mother    Diabetes Mother    Bipolar disorder Mother    Ulcers Paternal Grandmother    Colon cancer Neg Hx    Rectal cancer Neg Hx    Stomach cancer Neg Hx    Esophageal cancer Neg Hx     Social History:   reports that she has never smoked. She has never used smokeless tobacco. She reports current alcohol use. She reports that she does not use drugs.  Physical Exam: BP 105/68   Pulse 83   Ht '5\' 5"'$  (1.651 m)   Wt 161 lb (73 kg)   BMI 26.79 kg/m   Constitutional:  Alert and oriented, no acute distress, nontoxic appearing HEENT: [place], AT Cardiovascular: No clubbing, cyanosis, or edema Respiratory: Normal respiratory effort, no increased work of breathing Skin: No rashes, bruises or suspicious  lesions Neurologic: Grossly intact, no focal deficits, moving all 4 extremities Psychiatric: Normal mood and affect  Laboratory Data: Results for orders placed or performed in visit on 12/02/20  Microscopic Examination   Urine  Result Value Ref Range   WBC, UA >30 (A) 0 - 5 /hpf   RBC 0-2 0 - 2 /hpf   Epithelial Cells (non renal) 0-10 0 - 10 /hpf   Bacteria, UA Many (A) None seen/Few  Urinalysis, Complete  Result Value Ref Range   Specific Gravity, UA 1.020 1.005 - 1.030   pH, UA 6.5 5.0 -  7.5   Color, UA Yellow Yellow   Appearance Ur Cloudy (A) Clear   Leukocytes,UA 1+ (A) Negative   Protein,UA Trace (A) Negative/Trace   Glucose, UA Negative Negative   Ketones, UA Trace (A) Negative   RBC, UA Trace (A) Negative   Bilirubin, UA Negative Negative   Urobilinogen, Ur 1.0 0.2 - 1.0 mg/dL   Nitrite, UA Negative Negative   Microscopic Examination See below:    Assessment & Plan:   1. Recurrent UTI VSS today, now asymptomatic, though UA is notable for pyuria and bacteriuria consistent with acute cystitis.  Patient prefers to defer antibiotic therapy pending her urine culture results, which is reasonable.  We will call her when they become available and treat if her symptoms have resumed.  I counseled her to contact clinic sooner if her symptoms return prior to her culture resulting and we may start her on empiric therapy.  She expressed understanding. - Urinalysis, Complete - CULTURE, URINE COMPREHENSIVE  Return for Will call with results.  Debroah Loop, PA-C  [facility] [address] [place], [place] [address] [phone]

## 2020-12-03 ENCOUNTER — Ambulatory Visit: Payer: Self-pay | Admitting: Urology

## 2020-12-03 LAB — URINALYSIS, COMPLETE
Bilirubin, UA: NEGATIVE
Glucose, UA: NEGATIVE
Nitrite, UA: NEGATIVE
Specific Gravity, UA: 1.02 (ref 1.005–1.030)
Urobilinogen, Ur: 1 mg/dL (ref 0.2–1.0)
pH, UA: 6.5 (ref 5.0–7.5)

## 2020-12-03 LAB — MICROSCOPIC EXAMINATION: WBC, UA: >30 /hpf — AB (ref 0–5)

## 2020-12-08 ENCOUNTER — Telehealth: Payer: Self-pay

## 2020-12-08 DIAGNOSIS — N390 Urinary tract infection, site not specified: Secondary | ICD-10-CM

## 2020-12-08 LAB — CULTURE, URINE COMPREHENSIVE

## 2020-12-08 MED ORDER — NITROFURANTOIN MONOHYD MACRO 100 MG PO CAPS: 100.0000 mg | ORAL_CAPSULE | Freq: Two times a day (BID) | ORAL | 0 refills | Status: AC

## 2020-12-08 NOTE — Telephone Encounter (Signed)
Patient states pain has returned and she has continued to take Azo daily. ABX Rx sent to patients pharmacy, patient expressed understanding.

## 2020-12-08 NOTE — Telephone Encounter (Signed)
-----   Message from Debroah Loop, [place] sent at 12/08/2020 12:48 PM EDT ----- If her pain has returned, please send in [place] 100mg  BID x5 days. Otherwise ok to defer treatment in the setting of asymptomatic bacteriuria. ----- Message ----- From: Interface, [facility] Lab Results In Sent: 12/03/2020   5:37 AM EDT To: Debroah Loop, PA-C

## 2020-12-12 ENCOUNTER — Emergency Department: Payer: PPO

## 2020-12-12 ENCOUNTER — Emergency Department
Admission: EM | Admit: 2020-12-12 | Discharge: 2020-12-12 | Disposition: A | Discharge status: Home / Self Care | Payer: PPO | Attending: Emergency Medicine | Admitting: Emergency Medicine

## 2020-12-12 ENCOUNTER — Other Ambulatory Visit: Payer: Self-pay

## 2020-12-12 DIAGNOSIS — S52502A Unspecified fracture of the lower end of left radius, initial encounter for closed fracture: Secondary | ICD-10-CM | POA: Diagnosis not present

## 2020-12-12 DIAGNOSIS — Z7982 Long term (current) use of aspirin: Secondary | ICD-10-CM | POA: Insufficient documentation

## 2020-12-12 DIAGNOSIS — I2510 Atherosclerotic heart disease of native coronary artery without angina pectoris: Secondary | ICD-10-CM | POA: Insufficient documentation

## 2020-12-12 DIAGNOSIS — Y9222 Religious institution as the place of occurrence of the external cause: Secondary | ICD-10-CM | POA: Insufficient documentation

## 2020-12-12 DIAGNOSIS — Z79899 Other long term (current) drug therapy: Secondary | ICD-10-CM | POA: Diagnosis not present

## 2020-12-12 DIAGNOSIS — I10 Essential (primary) hypertension: Secondary | ICD-10-CM | POA: Insufficient documentation

## 2020-12-12 DIAGNOSIS — W1839XA Other fall on same level, initial encounter: Secondary | ICD-10-CM | POA: Insufficient documentation

## 2020-12-12 DIAGNOSIS — R9082 White matter disease, unspecified: Secondary | ICD-10-CM | POA: Diagnosis not present

## 2020-12-12 DIAGNOSIS — S6292XA Unspecified fracture of left wrist and hand, initial encounter for closed fracture: Secondary | ICD-10-CM | POA: Diagnosis not present

## 2020-12-12 DIAGNOSIS — G20 Parkinson's disease: Secondary | ICD-10-CM | POA: Diagnosis not present

## 2020-12-12 DIAGNOSIS — S0993XA Unspecified injury of face, initial encounter: Secondary | ICD-10-CM | POA: Diagnosis not present

## 2020-12-12 DIAGNOSIS — M4312 Spondylolisthesis, cervical region: Secondary | ICD-10-CM | POA: Diagnosis not present

## 2020-12-12 DIAGNOSIS — S0083XA Contusion of other part of head, initial encounter: Secondary | ICD-10-CM

## 2020-12-12 DIAGNOSIS — Q7192 Unspecified reduction defect of left upper limb: Secondary | ICD-10-CM

## 2020-12-12 DIAGNOSIS — S199XXA Unspecified injury of neck, initial encounter: Secondary | ICD-10-CM | POA: Diagnosis not present

## 2020-12-12 DIAGNOSIS — S62102A Fracture of unspecified carpal bone, left wrist, initial encounter for closed fracture: Secondary | ICD-10-CM

## 2020-12-12 DIAGNOSIS — R58 Hemorrhage, not elsewhere classified: Secondary | ICD-10-CM | POA: Diagnosis not present

## 2020-12-12 DIAGNOSIS — R52 Pain, unspecified: Secondary | ICD-10-CM | POA: Diagnosis not present

## 2020-12-12 DIAGNOSIS — M50321 Other cervical disc degeneration at C4-C5 level: Secondary | ICD-10-CM | POA: Diagnosis not present

## 2020-12-12 DIAGNOSIS — M5031 Other cervical disc degeneration,  high cervical region: Secondary | ICD-10-CM | POA: Diagnosis not present

## 2020-12-12 DIAGNOSIS — W19XXXA Unspecified fall, initial encounter: Secondary | ICD-10-CM | POA: Diagnosis not present

## 2020-12-12 DIAGNOSIS — S60212A Contusion of left wrist, initial encounter: Secondary | ICD-10-CM | POA: Diagnosis not present

## 2020-12-12 DIAGNOSIS — S6992XA Unspecified injury of left wrist, hand and finger(s), initial encounter: Secondary | ICD-10-CM | POA: Diagnosis present

## 2020-12-12 DIAGNOSIS — M25539 Pain in unspecified wrist: Secondary | ICD-10-CM | POA: Diagnosis not present

## 2020-12-12 MED ORDER — LIDOCAINE HCL (PF) 1 % IJ SOLN
5.0000 mL | Freq: Once | INTRAMUSCULAR | Status: AC
  Administered 2020-12-12: 5 mL
  Filled 2020-12-12: qty 5

## 2020-12-12 MED ORDER — OXYCODONE-ACETAMINOPHEN 5-325 MG PO TABS: 1.0000 | ORAL_TABLET | Freq: Three times a day (TID) | ORAL | 0 refills | Status: AC | PRN

## 2020-12-12 MED ORDER — OXYCODONE-ACETAMINOPHEN 5-325 MG PO TABS
1.0000 | ORAL_TABLET | Freq: Once | ORAL | Status: AC
Start: 2020-12-12 — End: 2020-12-12
  Administered 2020-12-12: 1 via ORAL
  Filled 2020-12-12: qty 1

## 2020-12-12 NOTE — ED Triage Notes (Signed)
See first nurse note. Is not on any thinners except daily aspirin. Denies LOC. Ice pack given for wrist.

## 2020-12-12 NOTE — ED Notes (Signed)
Lidocaine at bedside for provider  

## 2020-12-12 NOTE — ED Triage Notes (Signed)
FIRST NURSE NOTE:   Pt [place] via [facility] [place] pt tried to brace herself when she fell, c/o L wrist pain with ROM, pt has hematoma to L eye brow and small laceration.  Pt has hx of parkinsons, walks with a cane.  Pt takes ASA daily.

## 2020-12-12 NOTE — Discharge Instructions (Addendum)
Take the prescription meds as needed for pain. Follow-up with Ortho as discussed.

## 2020-12-12 NOTE — ED Provider Notes (Signed)
HPI: Pt is a 80 y.o. female who presents with complaints of fall   The patient p/w  fall, hit head and L wrist injury   ROS: Denies fever, chest pain, vomiting  Past Medical History:  Diagnosis Date   ADD (attention deficit disorder)    Anemia    Esophageal stricture 11/04/2014   GERD (gastroesophageal reflux disease)    H/O hiatal hernia    Hx: UTI (urinary tract infection)    Interstitial cystitis    Kidney stone    Lumbago    Palpitations 02/2017   Paraesophageal hernia    Parkinson disease ([place])    Parkinson's disease ([place]) 07/2019   Pneumonia 05-09-11   2011 last time   Stroke [facility])    Weight loss    Wound disruption, post-op, skin    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 The patient will be placed in the appropriate treatment space, once one is available, to complete the evaluation and treatment. I have discussed the plan of care with the patient and I have advised the patient that an ED physician or mid-level practitioner will reevaluate their condition after the test results have been received, as the results may give them additional insight into the type of treatment they may need.   Diagnostics:CT/xrays   Treatments: none immediately   Vanessa Champlin, MD 12/12/20 1251

## 2020-12-12 NOTE — ED Provider Notes (Signed)
[facility] Emergency Department Provider Note ____________________________________________  Time seen: 1412  I have reviewed the triage vital signs and the nursing notes.  HISTORY  Chief Complaint  Fall   HPI Stephanie Bell is a 80 y.o. female presents to the ED EMS for evaluation of a mechanical fall at church.  Patient fell on church parking, and had abrasions over the left hand.  She fell on outstretched left wrist, presents with pain and disability.  She also has a small hematoma noted to the left brow.  She denies any LOC or preceding nausea or weakness.  She does have history of Parkinson, uses a cane to ambulate.  She takes aspirin daily but denies any blood thinner use.  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    Patient Active Problem List   Diagnosis Date Noted   Aortic atherosclerosis ([place]) 03/09/2020   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   CAD in native artery 05/29/2018   Anemia, unspecified 10/16/2017   Kidney stone 10/16/2017   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   Essential hypertension    Atypical chest pain    Chest pain 03/04/2017   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   Lacunar stroke 05/14/2015   Elevated troponin 01/22/2015   Esophageal stricture 11/04/2014   GERD (gastroesophageal reflux disease) 11/04/2014   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   Hand  pain 05/01/2013   Sebaceous cyst of breast 12/20/2011   S/P Nissen fundoplication (without gastrostomy tube) procedure 06/08/2011    Past Surgical History:  Procedure Laterality Date   BACK SURGERY  05-09-11   x 2 lumbar   CHOLECYSTECTOMY  05-09-11   laparoscopic   COLONOSCOPY  2002, 2008    rectal hyperplastic polyp 2002, diverticulosis 2008   ESOPHAGOGASTRODUODENOSCOPY     HERNIA REPAIR  05/16/2011   hiatal hernia   HIATAL HERNIA REPAIR  02/09/2012   Procedure: LAPAROSCOPIC REPAIR OF HIATAL HERNIA;  Surgeon: Pedro Earls, MD;  Location: [facility];  Service: General;  Laterality: N/A;   INSERTION OF MESH  02/09/2012   Procedure: INSERTION OF MESH;  Surgeon: Pedro Earls, MD;  Location: [facility];  Service: General;;   IRRIGATION AND DEBRIDEMENT SEBACEOUS CYST  05/16/2011   Procedure: IRRIGATION AND DEBRIDEMENT SEBACEOUS CYST;  Surgeon: Pedro Earls, MD;  Location: [facility];  Service: General;  Laterality: N/A;  middle of chest on sternum   LAPAROSCOPIC NISSEN FUNDOPLICATION  6/96/2952   Procedure: LAPAROSCOPIC NISSEN FUNDOPLICATION;  Surgeon: Pedro Earls, MD;  Location: [facility];  Service: General;  Laterality: N/A;  will also remove sebaceous cyst of chest wall   LAPAROSCOPIC NISSEN FUNDOPLICATION  84/13/2440   Procedure: LAPAROSCOPIC NISSEN FUNDOPLICATION;  Surgeon: Pedro Earls, MD;  Location: [facility];  Service: General;  Laterality: N/A;  Re-do Laparoscopic Nissen    SEPTOPLASTY  TONSILLECTOMY  05-09-11   Tonsillectomy   UMBILICAL HERNIA REPAIR  05/16/2011   Procedure: HERNIA REPAIR UMBILICAL ADULT;  Surgeon: Pedro Earls, MD;  Location: [facility];  Service: General;  Laterality: N/A;  with mesh    UPPER GASTROINTESTINAL ENDOSCOPY     VAGINAL HYSTERECTOMY      Prior to Admission medications   Medication Sig Start Date End Date Taking? Authorizing Provider  aspirin EC 81 MG tablet Take 81 mg by mouth daily.    [provider]  carbidopa-levodopa (SINEMET IR)  25-100 MG tablet 1 tablet in the morning, May also take 1/2 to 1 tablet at midday and in the evening 08/11/20   Melvenia Beam, MD  Cholecalciferol (VITAMIN D3) 5000 UNITS CAPS Take 5,000 Units by mouth once a week.     [provider]  Cranberry-Vitamin C 84-20 MG CAPS Take 2 capsules by mouth daily.    [provider]  Cyanocobalamin (VITAMIN B-12 PO) Take 1,000 mcg by mouth daily.     [provider]  methylphenidate (RITALIN) 10 MG tablet Take 10 mg by mouth daily as needed. 02/23/20   [provider]  mirabegron ER (MYRBETRIQ) 25 MG TB24 tablet Take 1 tablet (25 mg total) by mouth daily. 06/14/20   Vaillancourt, Aldona Bar, PA-C  nitrofurantoin, macrocrystal-monohydrate, (MACROBID) 100 MG capsule Take 1 capsule (100 mg total) by mouth 2 (two) times daily for 5 days. 12/08/20 12/13/20  Debroah Loop, PA-C  nystatin ointment (MYCOSTATIN) Apply topically.    [provider]  ondansetron (ZOFRAN ODT) 4 MG disintegrating tablet Dissolve 1 tablet on the tongue every 6 hours as needed for nausea. 03/26/18   Esterwood, Amy S, PA-C  sertraline (ZOLOFT) 25 MG tablet Take 25 mg by mouth daily. 12/31/18   [provider]    Allergies Benzocaine, Epinephrine hcl (nasal), Other, Omeprazole, and Sulfa antibiotics  Family History  Problem Relation Age of Onset   Ulcers Father    Alcohol abuse Father    Alzheimer's disease Mother    Stroke Mother    Diabetes Mother    Bipolar disorder Mother    Ulcers Paternal Grandmother    Colon cancer Neg Hx    Rectal cancer Neg Hx    Stomach cancer Neg Hx    Esophageal cancer Neg Hx     Social History Social History   Tobacco Use   Smoking status: Never   Smokeless tobacco: Never  Vaping Use   Vaping Use: Never used  Substance Use Topics   Alcohol use: Yes    Comment: occasionally has a glass of wine at night    Drug use: No    Review of Systems  Constitutional: Negative for fever. Eyes:  Negative for visual changes. ENT: Negative for sore throat. Cardiovascular: Negative for chest pain. Respiratory: Negative for shortness of breath. Gastrointestinal: Negative for abdominal pain, vomiting and diarrhea. Genitourinary: Negative for dysuria. Musculoskeletal: Negative for back pain.  Left wrist pain and disability as above. Skin: Negative for rash.  Facial abrasion as above. Neurological: Negative for headaches, focal weakness or numbness. ____________________________________________  PHYSICAL EXAM:  VITAL SIGNS: ED Triage Vitals  Enc Vitals Group     BP 12/12/20 1241 (!) 150/101     Pulse Rate 12/12/20 1241 79     Resp 12/12/20 1241 18     Temp 12/12/20 1241 98.7 F (37.1 C)     Temp src --      SpO2 12/12/20 1241 100 %  Weight 12/12/20 1240 162 lb (73.5 kg)     Height 12/12/20 1240 5\' 5"  (1.651 m)     Head Circumference --      Peak Flow --      Pain Score 12/12/20 1243 10     Pain Loc --      Pain Edu? --      Excl. in [place]? --     Constitutional: Alert and oriented. Well appearing and in no distress. Head: Normocephalic and atraumatic, except for left brow hematoma and abrasion. Eyes: Conjunctivae are normal. PERRL. Normal extraocular movements Nose: No congestion/rhinorrhea/epistaxis. Mouth/Throat: Mucous membranes are moist. Neck: Supple. Normal ROM Cardiovascular: Normal rate, regular rhythm. Normal distal pulses. Respiratory: Normal respiratory effort. No wheezes/rales/rhonchi. Gastrointestinal: Soft and nontender. No distention. Musculoskeletal: Left wrist with obvious deformity and tenderness over the dorsal radial wrist.  Normal composite fist is noted.  Patient is able to pronate and supinate with limited difficulty.  Nontender with normal range of motion in all other extremities.  Neurologic: Cranial nerves II through XII grossly intact.  Normal sensation.. Normal speech and language. No gross focal neurologic deficits are appreciated. Skin:  Skin  is warm, dry and intact. No rash noted. Psychiatric: Mood and affect are normal. Patient exhibits appropriate insight and judgment. ____________________________________________    {LABS (pertinent positives/negatives)  ____________________________________________  {EKG  ____________________________________________   RADIOLOGY Official radiology report(s): DG Wrist Complete Left  Result Date: 12/12/2020 CLINICAL DATA:  Fall, pain EXAM: LEFT WRIST - COMPLETE 3+ VIEW COMPARISON:  None. FINDINGS: Mildly impacted, angulated fracture of the distal left radius, without clear intra-articular extent. The distal left ulna is intact. The carpus proper is normally aligned. Soft tissue edema about the wrist. IMPRESSION: Mildly impacted, angulated fracture of the distal left radius, without clear intra-articular extent. The distal left ulna is intact. The carpus proper is normally aligned. Electronically Signed   By: Eddie Candle M.D.   On: 12/12/2020 13:20   CT HEAD WO CONTRAST (5MM)  Result Date: 12/12/2020 CLINICAL DATA:  Facial injury after fall. EXAM: CT HEAD WITHOUT CONTRAST CT MAXILLOFACIAL WITHOUT CONTRAST CT CERVICAL SPINE WITHOUT CONTRAST TECHNIQUE: Multidetector CT imaging of the head, cervical spine, and maxillofacial structures were performed using the standard protocol without intravenous contrast. Multiplanar CT image reconstructions of the cervical spine and maxillofacial structures were also generated. COMPARISON:  September 04, 2014. FINDINGS: CT HEAD FINDINGS Brain: Mild chronic ischemic white matter disease is noted. No mass effect or midline shift is noted. Ventricular size is within normal limits. There is no evidence of mass lesion, hemorrhage or acute infarction. Vascular: No hyperdense vessel or unexpected calcification. Skull: Normal. Negative for fracture or focal lesion. Other: None. CT MAXILLOFACIAL FINDINGS Osseous: No fracture or mandibular dislocation. No destructive process. Orbits:  Negative. No traumatic or inflammatory finding. Sinuses: Clear. Soft tissues: Small left supraorbital contusion is noted. CT CERVICAL SPINE FINDINGS Alignment: Mild grade 1 anterolisthesis of C3-4 and C4-5 is noted secondary to posterior facet joint hypertrophy. Minimal grade 1 retrolisthesis of C5-6 is noted secondary to severe degenerative disc disease at this level. Skull base and vertebrae: No acute fracture. No primary bone lesion or focal pathologic process. Soft tissues and spinal canal: No prevertebral fluid or swelling. No visible canal hematoma. Disc levels: Severe degenerative disc disease is noted at C5-6 and C6-7. Mild degenerative disc disease is noted at C3-4 and C4-5. Upper chest: Negative. Other: Degenerative changes are seen involving the left-sided posterior facet joints. IMPRESSION: No acute intracranial abnormality seen.  Small left supraorbital contusion is noted. No other abnormality seen in the maxillofacial region. Multilevel degenerative disc disease is noted in the cervical spine. No acute abnormality is noted. Electronically Signed   By: Marijo Conception M.D.   On: 12/12/2020 13:37   CT Cervical Spine Wo Contrast  Result Date: 12/12/2020 CLINICAL DATA:  Facial injury after fall. EXAM: CT HEAD WITHOUT CONTRAST CT MAXILLOFACIAL WITHOUT CONTRAST CT CERVICAL SPINE WITHOUT CONTRAST TECHNIQUE: Multidetector CT imaging of the head, cervical spine, and maxillofacial structures were performed using the standard protocol without intravenous contrast. Multiplanar CT image reconstructions of the cervical spine and maxillofacial structures were also generated. COMPARISON:  September 04, 2014. FINDINGS: CT HEAD FINDINGS Brain: Mild chronic ischemic white matter disease is noted. No mass effect or midline shift is noted. Ventricular size is within normal limits. There is no evidence of mass lesion, hemorrhage or acute infarction. Vascular: No hyperdense vessel or unexpected calcification. Skull: Normal.  Negative for fracture or focal lesion. Other: None. CT MAXILLOFACIAL FINDINGS Osseous: No fracture or mandibular dislocation. No destructive process. Orbits: Negative. No traumatic or inflammatory finding. Sinuses: Clear. Soft tissues: Small left supraorbital contusion is noted. CT CERVICAL SPINE FINDINGS Alignment: Mild grade 1 anterolisthesis of C3-4 and C4-5 is noted secondary to posterior facet joint hypertrophy. Minimal grade 1 retrolisthesis of C5-6 is noted secondary to severe degenerative disc disease at this level. Skull base and vertebrae: No acute fracture. No primary bone lesion or focal pathologic process. Soft tissues and spinal canal: No prevertebral fluid or swelling. No visible canal hematoma. Disc levels: Severe degenerative disc disease is noted at C5-6 and C6-7. Mild degenerative disc disease is noted at C3-4 and C4-5. Upper chest: Negative. Other: Degenerative changes are seen involving the left-sided posterior facet joints. IMPRESSION: No acute intracranial abnormality seen. Small left supraorbital contusion is noted. No other abnormality seen in the maxillofacial region. Multilevel degenerative disc disease is noted in the cervical spine. No acute abnormality is noted. Electronically Signed   By: Marijo Conception M.D.   On: 12/12/2020 13:37   CT Maxillofacial Wo Contrast  Result Date: 12/12/2020 CLINICAL DATA:  Facial injury after fall. EXAM: CT HEAD WITHOUT CONTRAST CT MAXILLOFACIAL WITHOUT CONTRAST CT CERVICAL SPINE WITHOUT CONTRAST TECHNIQUE: Multidetector CT imaging of the head, cervical spine, and maxillofacial structures were performed using the standard protocol without intravenous contrast. Multiplanar CT image reconstructions of the cervical spine and maxillofacial structures were also generated. COMPARISON:  September 04, 2014. FINDINGS: CT HEAD FINDINGS Brain: Mild chronic ischemic white matter disease is noted. No mass effect or midline shift is noted. Ventricular size is within  normal limits. There is no evidence of mass lesion, hemorrhage or acute infarction. Vascular: No hyperdense vessel or unexpected calcification. Skull: Normal. Negative for fracture or focal lesion. Other: None. CT MAXILLOFACIAL FINDINGS Osseous: No fracture or mandibular dislocation. No destructive process. Orbits: Negative. No traumatic or inflammatory finding. Sinuses: Clear. Soft tissues: Small left supraorbital contusion is noted. CT CERVICAL SPINE FINDINGS Alignment: Mild grade 1 anterolisthesis of C3-4 and C4-5 is noted secondary to posterior facet joint hypertrophy. Minimal grade 1 retrolisthesis of C5-6 is noted secondary to severe degenerative disc disease at this level. Skull base and vertebrae: No acute fracture. No primary bone lesion or focal pathologic process. Soft tissues and spinal canal: No prevertebral fluid or swelling. No visible canal hematoma. Disc levels: Severe degenerative disc disease is noted at C5-6 and C6-7. Mild degenerative disc disease is noted at C3-4 and C4-5. Upper  chest: Negative. Other: Degenerative changes are seen involving the left-sided posterior facet joints. IMPRESSION: No acute intracranial abnormality seen. Small left supraorbital contusion is noted. No other abnormality seen in the maxillofacial region. Multilevel degenerative disc disease is noted in the cervical spine. No acute abnormality is noted. Electronically Signed   By: Marijo Conception M.D.   On: 12/12/2020 13:37   ____________________________________________  PROCEDURES  Percocet 5-325 mg PO  Reduction of fracture  Date/Time: 12/12/2020 2:25 PM Performed by: Melvenia Needles, PA-C Authorized by: Melvenia Needles, PA-C  Consent: Verbal consent obtained. Written consent not obtained. Risks and benefits: risks, benefits and alternatives were discussed Consent given by: patient Patient understanding: patient states understanding of the procedure being performed Patient consent: the  patient's understanding of the procedure matches consent given Imaging studies: imaging studies available Patient identity confirmed: verbally with patient Preparation: Patient was prepped and draped in the usual sterile fashion. Local anesthesia used: yes Anesthesia: hematoma block  Anesthesia: Local anesthesia used: yes Local Anesthetic: lidocaine 1% without epinephrine Anesthetic total: 5 mL  Sedation: Patient sedated: no  Patient tolerance: patient tolerated the procedure well with no immediate complications Comments: Manual reduction of wrist fracture.   Marland KitchenSplint Application  Date/Time: 12/12/2020 2:25 PM Performed by: Melvenia Needles, PA-C Authorized by: Melvenia Needles, PA-C   Consent:    Consent obtained:  Verbal   Consent given by:  Patient   Risks, benefits, and alternatives were discussed: yes     Risks discussed:  Pain Universal protocol:    Procedure explained and questions answered to patient or proxy's satisfaction: yes     Imaging studies available: yes     Site/side marked: yes     Patient identity confirmed:  Verbally with patient Pre-procedure details:    Distal neurologic exam:  Normal   Distal perfusion: distal pulses strong   Procedure details:    Location:  Wrist   Wrist location:  L wrist   Supplies:  Elastic bandage, cotton padding and fiberglass   Attestation: Splint applied and adjusted personally by me   Post-procedure details:    Distal neurologic exam:  Normal   Distal perfusion: distal pulses strong     Procedure completion:  Tolerated well, no immediate complications ____________________________________________   INITIAL IMPRESSION / ASSESSMENT AND PLAN / ED COURSE  As part of my medical decision making, I reviewed the following data within the [place] reviewed as noted, Notes from prior ED visits, and [place] Controlled Substance Database   Geriatric patient ED evaluation of injury sustained  following a mechanical fall.  Patient presents with a small hematoma and abrasion over the left brow, and an acute wrist fracture confirmed on x-ray.  An attempted reduction is performed, with minimal movement of the fracture fragments.  Patient is endorsing provement of back pain and range of motion.  She is placed in a volar splint, and is referred to Ortho for further evaluation.  A prescription for Percocet provided for her benefit.  She will follow-up as discussed and return to the ED if needed.   Stephanie Bell was evaluated in Emergency Department on 12/12/2020 for the symptoms described in the history of present illness. She was evaluated in the context of the global COVID-19 pandemic, which necessitated consideration that the patient might be at risk for infection with the SARS-CoV-2 virus that causes COVID-19. Institutional protocols and algorithms that pertain to the evaluation of patients at risk  for COVID-19 are in a state of rapid change based on information released by regulatory bodies including the CDC and federal and state organizations. These policies and algorithms were followed during the patient's care in the ED.  I reviewed the patient's prescription history over the last 12 months in the multi-state controlled substances database(s) that includes [place], [place], [place], [place], [place], [place], [place], [place], [place], [place], [place], [place], [place], and [place].  Results were notable for no current RXs.  ____________________________________________  FINAL CLINICAL IMPRESSION(S) / ED DIAGNOSES  Final diagnoses:  Contusion of face, initial encounter  Closed fracture of left wrist, initial encounter      Melvenia Needles, PA-C 12/12/20 2017    Vanessa [place], MD 12/13/20 [phone]

## 2020-12-13 DIAGNOSIS — S46911A Strain of unspecified muscle, fascia and tendon at shoulder and upper arm level, right arm, initial encounter: Secondary | ICD-10-CM | POA: Diagnosis not present

## 2020-12-13 DIAGNOSIS — S52512A Displaced fracture of left radial styloid process, initial encounter for closed fracture: Secondary | ICD-10-CM | POA: Diagnosis not present

## 2020-12-14 DIAGNOSIS — S62102D Fracture of unspecified carpal bone, left wrist, subsequent encounter for fracture with routine healing: Secondary | ICD-10-CM | POA: Diagnosis not present

## 2020-12-14 DIAGNOSIS — S0083XD Contusion of other part of head, subsequent encounter: Secondary | ICD-10-CM | POA: Diagnosis not present

## 2020-12-14 DIAGNOSIS — S0181XD Laceration without foreign body of other part of head, subsequent encounter: Secondary | ICD-10-CM | POA: Diagnosis not present

## 2020-12-22 DIAGNOSIS — S46911A Strain of unspecified muscle, fascia and tendon at shoulder and upper arm level, right arm, initial encounter: Secondary | ICD-10-CM | POA: Diagnosis not present

## 2020-12-22 DIAGNOSIS — S52512A Displaced fracture of left radial styloid process, initial encounter for closed fracture: Secondary | ICD-10-CM | POA: Diagnosis not present

## 2021-01-05 DIAGNOSIS — S46911A Strain of unspecified muscle, fascia and tendon at shoulder and upper arm level, right arm, initial encounter: Secondary | ICD-10-CM | POA: Diagnosis not present

## 2021-01-05 DIAGNOSIS — S52512A Displaced fracture of left radial styloid process, initial encounter for closed fracture: Secondary | ICD-10-CM | POA: Diagnosis not present

## 2021-01-26 DIAGNOSIS — S52532A Colles' fracture of left radius, initial encounter for closed fracture: Secondary | ICD-10-CM | POA: Diagnosis not present

## 2021-02-15 DIAGNOSIS — Z0000 Encounter for general adult medical examination without abnormal findings: Secondary | ICD-10-CM | POA: Diagnosis not present

## 2021-02-17 ENCOUNTER — Encounter: Payer: Self-pay | Admitting: Adult Health

## 2021-02-17 ENCOUNTER — Other Ambulatory Visit: Payer: Self-pay

## 2021-02-17 ENCOUNTER — Ambulatory Visit: Payer: PPO | Admitting: Adult Health

## 2021-02-17 VITALS — BP 117/80 | HR 76 | Ht 65.0 in | Wt 163.2 lb

## 2021-02-17 DIAGNOSIS — G20 Parkinson's disease: Secondary | ICD-10-CM | POA: Diagnosis not present

## 2021-02-17 NOTE — Patient Instructions (Signed)
Your Plan:  Continue sinemet 1 tablet in the AM, 1/2 at lunch and 1 tablet at bedtime If your symptoms worsen or you develop new symptoms please let us know.   Thank you for coming to see [place] at [facility]. I hope we have been able to provide you high quality care today.  You may receive a patient satisfaction survey over the next few weeks. We would appreciate your feedback and comments so that we may continue to improve ourselves and the health of our patients.

## 2021-02-17 NOTE — Progress Notes (Signed)
PATIENT: Stephanie Bell DOB: 17-Nov-1940  REASON FOR VISIT: follow up HISTORY FROM: patient   HISTORY OF PRESENT ILLNESS: Today 02/17/21:  Stephanie Bell is an 80 year old female with a history of idiopathic Parkinson's disease.  She returns today for follow-up.  She states back in September she did have a fall and broke her wrist.  She had the cast removed today.  She states that she typically takes Sinemet 1 tablet in the morning, half a tablet at lunch and 1 tablet in the evening.  Although she does state that sometimes she does not take the evening dose.  She denies any significant changes in her gait or balance.  She does use a cane when ambulating.  Reports mild tremor in the left hand.  Denies any trouble swallowing or chewing food.  Reports that sometimes she has a hard time sleeping but she is able to nap during the day if needed.  She denies any new symptoms.  Returns today for an evaluation.  HISTORY (copied from Dr. Cathren Laine note) 08/11/2020; DAT Scan was consistent with parkinson's disease. We discussed today. She went to [facility] and diagnosed with likely idiopathic parkinson's disease. She declines PT, I advised her to stay active. Given her orthostatic hypotension, increasing sinemet may pose a problem, Since a dosage increase in the future is most likely inevitable, Sinemet may create challenges per [facility]. Since she was unable to tolerate compression stockings, counseling regarding adequate hydration, abdominal binder, medications such as midodrine or fludricortisone may all be needed and we discussed that today during the appointment. No swallowing difficulties, no falls at home, discussed fall risks. She feels stable. Tremors are not worsening. Not dropping things. We will continue to follow her and if needed in the future she can go back to [facility] if treatment presents a problem for [place] and her.    DAT Scan 06/03/20: reviewed images Asymmetric decreased striatal  Ioflupane activity as above. Loss of activity is greater on the RIGHT than the LEFT. This pattern can be seen in Parkinsonian syndromes   04/08/2020: Here for follow up. Today we discussed her symptoms and reviewed her past, she doesn't feel she has more memory problems than an 79 year should, she has to write down appointments and review schedule daily. We discussed driving and her reflexes may be reduced, she only goes to the beauty shop and places that are close by. She has excellent vision. Her night vision is not as good, I encouraged her not to drive at night and stay just a few miles from home to familiar locations. The Sinemet is helping. We had suggested a DAT scan in the past when her symptoms were milder, today she brings up the DAT Scan and wants to make sure she has PD and not essential tremor and we can order a DAT Scan. No falls. No postural symptoms. She uses a walker at home. She uses a cane outside the home and this works fine. She has a slight resting tremor ont he left, she states it is only because she is under stress. They declined PT at home for exercise. Not hypophonic. She was having severe orthostatic hypotension and now she can take the Sinemet without a problem. She talks in her sleep and can talk and hold a conversation in her sleep. Not acting out dreams. She has decreased smell and taste. No problems swallowing. No dyskinesias.    10/02/2019: Patient is here with her husband, this is the first  time I have met her husband, we revieiwed patient's workup, history, I answered any questions and we discussed the diagnosis of parkinson's disease and I provided much information for patient and husband, we discussed exercise and reviewed lots of exercise options for PD and how important that is, patient states Feels like the Sinemet is really helping with tremor, no tremors since taking it, she feels her walking is k, she still uses a cane. She went to a urologist a few months ago and she  was having constipation problems, she was given miralax for 3 days and then a dulcolax. She is taking 1/2 the dose of miralax and since then she has been fine without miralax. She had a hiatal hernia and she had surgery and did great but coughed and it came loose and then she had the surgery again, she had an episode last May and she had a dry tablet and was eating breakfast and it was coming back up but this is not new, she threw up and no more incidents since then she tries to make sure she doesn't eat anything dry, she hashad to learn but this week she had some sticking in her throat with a dry tablet again and then she had some sticking and she spit up a little bit, she has slowed down and it is better and pill crusher helps. She had a swallow test earlier this year, and she has had a swallow test and follows with her GI doctor regularly and is monitoring, no falls, husband says he doesn't let her carry the groceries and he laughs and says he doesn't make her mow the lawn either and laughs. Husband has placed some bars around the house for safety and she now has a vaccuuming robot. She will use a cane or a rolling walker.  She does not have sleep apnea, she does not snore, husband doesn't seem to feel she is very active in her sleep but she does have vivid dreams and had them for years but appears to be better the last several years. She is on daily medication now for UTIs for 6 months Trimethorprin and following for that with urology. No dyskinesias.    Interval history June 30, 2019: Patient was seen in the past for thalamic stroke and tremor, we did note she had some mild parkinsonian symptoms at last appointment in 2019(she never followed up with [place]), we discussed further testing and a DaTscan at that time she agreed to the [place] but then changed her mind and declined, since I last saw her in 2019 she has been diagnosed with Parkinson's disease and she has already seen a neurologist.  She saw Dr. Manuella Ghazi  in March 2021, I reviewed his notes which showed left upper extremity resting tremors, imbalance and history of REM sleep disorder, show shuffling gait, decreased left arm swing, slowness, stiffness, falls and constipation, diagnosed with parkinsonism likely idiopathic Parkinson's disease.  MRI of the brain was ordered as well as vitamin B12 and vitamin D, she was started on carbidopa levodopa 3 times a day, MiraLAX Dulcolax and enema for constipation, she was talked to at length about her UTI, patient has a long history of UTIs.   Patient is here alone, As mentioned above, we saw her 2 years ago and recommend DatScan at that time which she declined but we suspected early parkinson's disease. In the 2 years(she never followed up) her symptoms have progressed and she is on sinemet currently see by another neurologist. She  has left resting tremor, worsening, she had one fall but she denies falling more often, she is walking slowly, she reports vivid dreams. She doesn't feel her memory is as good (she doesn't remember meeting me in the past or [place] talking about her parkinson's symptoms). She has a son who is POA he lives in [place], other son lives in [place], daughter has done a lot of research on parkinson's disease she she has lots of support.    Patient complains of symptoms per HPI as well as the following symptoms: tremors . Pertinent negatives and positives per HPI. All others negative     Interval history 04/30/2017: Patient is here for follow-up of tremor.  Her last appointment we discussed that she had some mild parkinsonian symptoms.  Discussed the differential which includes Parkinson's disease.  The options were to start medication, follow clinically, or perform a DaTscan.  At that time she wanted to perform the DaTscan but she changed her mind.  Today we discussed parkinsonism in all the different reasons.  She continues to have tremors, resting and action.  Discussed Parkinson's disease, also  essential tremor.  Discussed medication management and Parkinson's disease including dopamine, dopamine agonists and others.  At this time she decided that she like to follow clinically which I agree with.  We will see her back in 6 months.  Otherwise she is doing excellent, she is playing tennis.    Interval history 12/25/2016; She has noticed tremor in her left hand noticed it first in may when driving and more recently when holding something heavy, she notices it at rest as well. She has some stress in her life. Happens every day, No caffeine. She is on aspirin. No tremor in the family. No FHx of Parkinson's. No slowing, no shuffling, no falls so far. No hallucinations or delusions. She reports poor memory. She uses a walker at home, she says she has slowed down.    HPI:  Stephanie Bell is a lovely 80 y.o. female here as a referral from Dr. Kary Kos for thalamic stroke. She has a past medical history of hypertension, hyperlipidemia, no diabetes. She presented to the hospital on February 24 after waking up with right foot and leg numbness. She also reported weakness. No other neurologic deficits. No aphasia, no dysphagia, no dysarthria, no other weakness or numbness, no altered mentation. Her husband gave her to aspirin may went to the emergency room. She had an MRI of the brain which was positive for stroke.  Sheis feeling much better. Hisband here and provides information as well. No more numbness and no more weakness. She feels scared that she had a stroke, doesn't understand why or where in the brain she had it. Never had a stroke before. There were no inciting events, she just woke up. No FHx of stroke.   Reviewed notes, labs and imaging from outside physicians, which showed:   ldl 138. [id number] wnl   Personally reviewed images with patient and her husband and agree with the following:   MRI HEAD FINDINGS   Calvarium and upper cervical spine: No focal marrow signal abnormality.   Orbits:  Negative.   Sinuses and Mastoids: Mucosal thickening on the left, overall mild. No effusion.   Brain: Ovoid 1 cm area of restricted diffusion in the lateral left thalamus, extending into the posterior limb internal capsule. No hemorrhagic conversion. No other territory of acute infarct. No evidence of major vessel occlusion.   Elsewhere, microvascular ischemic change is mild/expected for age.  No previous infarct. No chronic hemorrhagic foci.   MRA HEAD FINDINGS   Symmetric carotid and vertebral arteries. Intact circle Willis with small anterior communicating artery.   Symmetric vertebrobasilar branching. Symmetric poor signal in distal vertebral and proximal basilar arteries is considered artifactual. These vessels have normal signal on T2 weighted conventional imaging.   Atherosclerotic type narrowings:   Distal left M1 segment high-grade tandem stenoses.   Moderate right proximal M2 segment stenosis.   Bilateral M3 narrowing, potentially overestimated due to artifact (poor signal in the bilateral A2 segments appears artifactual).   Bilateral atherosclerotic irregularity of the proximal PCA.     IMPRESSION: 1. 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 2. No acute arterial finding. 3. Intracranial atherosclerosis as described. No asymmetric disease in the proximal left PCA to correlate with #1.   Ultrasound carotids with no hemodynamically significant stenosis.    REVIEW OF SYSTEMS: Out of a complete 14 system review of symptoms, the patient complains only of the following symptoms, and all other reviewed systems are negative.  ALLERGIES: Allergies  Allergen Reactions   Benzocaine Palpitations    Specific agent was Scandonest 2 %   Epinephrine Hcl (Nasal) Palpitations and Other (See Comments)    Reaction:  Tachycardia    Other Other (See Comments) and Palpitations    EPINEPHRINE Reaction:  Tachycardia    Omeprazole Diarrhea   Sulfa Antibiotics Swelling  and Rash    HOME MEDICATIONS: Outpatient Medications Prior to Visit  Medication Sig Dispense Refill   aspirin EC 81 MG tablet Take 81 mg by mouth daily.     carbidopa-levodopa (SINEMET IR) 25-100 MG tablet 1 tablet in the morning, May also take 1/2 to 1 tablet at midday and in the evening 270 tablet 3   Cholecalciferol (VITAMIN D3) 5000 UNITS CAPS Take 5,000 Units by mouth once a week.      Cranberry-Vitamin C 84-20 MG CAPS Take 2 capsules by mouth daily.     Cyanocobalamin (VITAMIN B-12 PO) Take 1,000 mcg by mouth daily.      methylphenidate (RITALIN) 10 MG tablet Take 10 mg by mouth daily as needed.     mirabegron ER (MYRBETRIQ) 25 MG TB24 tablet Take 1 tablet (25 mg total) by mouth daily. 30 tablet 11   nystatin ointment (MYCOSTATIN) Apply topically.     ondansetron (ZOFRAN ODT) 4 MG disintegrating tablet Dissolve 1 tablet on the tongue every 6 hours as needed for nausea. 60 tablet 6   sertraline (ZOLOFT) 25 MG tablet Take 25 mg by mouth daily.     No facility-administered medications prior to visit.    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    PAST SURGICAL HISTORY: Past Surgical History:  Procedure Laterality Date   BACK SURGERY  05-09-11   x 2 lumbar   CHOLECYSTECTOMY  05-09-11   laparoscopic   COLONOSCOPY  2002,  2008    rectal hyperplastic polyp 2002, diverticulosis 2008   ESOPHAGOGASTRODUODENOSCOPY     HERNIA REPAIR  05/16/2011   hiatal hernia    HIATAL HERNIA REPAIR  02/09/2012   Procedure: LAPAROSCOPIC REPAIR OF HIATAL HERNIA;  Surgeon: Pedro Earls, MD;  Location: [facility];  Service: General;  Laterality: N/A;   INSERTION OF MESH  02/09/2012   Procedure: INSERTION OF MESH;  Surgeon: Pedro Earls, MD;  Location: [facility];  Service: General;;   IRRIGATION AND DEBRIDEMENT SEBACEOUS CYST  05/16/2011   Procedure: IRRIGATION AND DEBRIDEMENT SEBACEOUS CYST;  Surgeon: Pedro Earls, MD;  Location: [facility];  Service: General;  Laterality: N/A;  middle of chest on sternum   LAPAROSCOPIC NISSEN FUNDOPLICATION  12/02/7827   Procedure: LAPAROSCOPIC NISSEN FUNDOPLICATION;  Surgeon: Pedro Earls, MD;  Location: [facility];  Service: General;  Laterality: N/A;  will also remove sebaceous cyst of chest wall   LAPAROSCOPIC NISSEN FUNDOPLICATION  56/21/3086   Procedure: LAPAROSCOPIC NISSEN FUNDOPLICATION;  Surgeon: Pedro Earls, MD;  Location: [facility];  Service: General;  Laterality: N/A;  Re-do Laparoscopic Nissen    SEPTOPLASTY     TONSILLECTOMY  05-09-11   Tonsillectomy   UMBILICAL HERNIA REPAIR  05/16/2011   Procedure: HERNIA REPAIR UMBILICAL ADULT;  Surgeon: Pedro Earls, MD;  Location: [facility];  Service: General;  Laterality: N/A;  with mesh    UPPER GASTROINTESTINAL ENDOSCOPY     VAGINAL HYSTERECTOMY      FAMILY HISTORY: Family History  Problem Relation Age of Onset   Alzheimer's disease Mother    Stroke Mother    Diabetes Mother    Bipolar disorder Mother    Ulcers Father    Alcohol abuse Father    Ulcers Paternal Grandmother    Colon cancer Neg Hx    Rectal cancer Neg Hx    Stomach cancer Neg Hx    Esophageal cancer Neg Hx    Parkinson's disease Neg Hx     SOCIAL HISTORY: Social History   Socioeconomic History   Marital status: Married    Spouse name: [name]   Number of children: 3   Years of education: College   Highest education level: Not on file  Occupational History   Not on file  Tobacco Use   Smoking  status: Never   Smokeless tobacco: Never  Vaping Use   Vaping Use: Never used  Substance and Sexual Activity   Alcohol use: Yes    Alcohol/week: 4.0 standard drinks    Types: 4 Glasses of wine per week   Drug use: No   Sexual activity: Yes    Birth control/protection: Post-menopausal  Other Topics Concern   Not on file  Social History Narrative   Lives with husband.   Caffeine use: occasional coke   Right handed   Enjoys tennis   3 children    no tobacco or drug use   Occasional glass of wine at night    Social Determinants of Health   Financial Resource Strain: Not on file  Food Insecurity: Not on file  Transportation Needs: Not on file  Physical Activity: Not on file  Stress: Not on file  Social Connections: Not on file  Intimate Partner Violence: Not on file      PHYSICAL EXAM  Vitals:   02/17/21 1403  BP: 117/80  Pulse: 76  Weight: 163 lb 3.2 oz (74 kg)  Height: '5\' 5"'  (1.651 m)   Body mass index is 27.16  kg/m.  Generalized: Well developed, in no acute distress   Neurological examination  Mentation: Alert oriented to time, place, history taking. Follows all commands speech and language fluent Cranial nerve II-XII: Pupils were equal round reactive to light. Extraocular movements were full, visual field were full on confrontational test. Facial sensation and strength were normal.  Head turning and shoulder shrug  were normal and symmetric.  Motor: The motor testing reveals 5 over 5 strength of all 4 extremities. Good symmetric motor tone is noted throughout.  Moderate impairment finger taps and toe taps bilaterally. Sensory: Sensory testing is intact to soft touch on all 4 extremities. No evidence of extinction is noted.  Coordination: Cerebellar testing reveals good finger-nose-finger and heel-to-shin bilaterally.  Gait and station:  slightly stooped posture good stride uses a cane when ambulating.  Good arm swing Reflexes: Deep tendon reflexes are symmetric  and normal bilaterally.   DIAGNOSTIC DATA (LABS, IMAGING, TESTING) - I reviewed patient records, labs, notes, testing and imaging myself where available.  Lab Results  Component Value Date   WBC 6.5 02/10/2019   HGB 13.8 02/10/2019   HCT 39.9 02/10/2019   MCV 82.6 02/10/2019   PLT 170 02/10/2019      Component Value Date/Time   NA 138 02/10/2019 1542   NA 141 12/25/2016 1153   NA 141 06/05/2011 0251   K 3.8 02/10/2019 1542   K 3.6 06/05/2011 0251   CL 102 02/10/2019 1542   CL 106 06/05/2011 0251   CO2 26 02/10/2019 1542   CO2 22 06/05/2011 0251   GLUCOSE 92 02/10/2019 1542   GLUCOSE 107 (H) 06/05/2011 0251   BUN 9 02/10/2019 1542   BUN 7 (L) 12/25/2016 1153   BUN 8 06/05/2011 0251   CREATININE 0.83 02/10/2019 1542   CREATININE 0.63 06/05/2011 0251   CALCIUM 9.1 02/10/2019 1542   CALCIUM 9.0 06/05/2011 0251   PROT 7.1 03/31/2018 1007   PROT 6.2 12/25/2016 1153   PROT 6.7 06/05/2011 0251   ALBUMIN 4.4 03/31/2018 1007   ALBUMIN 4.1 12/25/2016 1153   ALBUMIN 3.6 06/05/2011 0251   AST 23 03/31/2018 1007   AST 30 06/05/2011 0251   ALT 16 03/31/2018 1007   ALT 20 06/05/2011 0251   ALKPHOS 61 03/31/2018 1007   ALKPHOS 66 06/05/2011 0251   BILITOT 1.4 (H) 03/31/2018 1007   BILITOT 0.8 12/25/2016 1153   BILITOT 0.7 06/05/2011 0251   GFRNONAA >60 02/10/2019 1542   GFRNONAA >60 06/05/2011 0251   GFRAA >60 02/10/2019 1542   GFRAA >60 06/05/2011 0251   Lab Results  Component Value Date   CHOL 199 03/05/2017   HDL 49 03/05/2017   LDLCALC 127 (H) 03/05/2017   TRIG 117 03/05/2017   CHOLHDL 4.1 03/05/2017   Lab Results  Component Value Date   HGBA1C 5.5 03/05/2017   No results found for: [id number] Lab Results  Component Value Date   TSH 0.578 12/25/2016      ASSESSMENT AND PLAN 80 y.o. 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 here with :  1.  Parkinson's disease  Continue Sinemet 25-100 mg 1 tablet in the morning, half a tablet at noon and 1  tablet at bedtime Stay well-hydrated Advised if symptoms worsen or she develops new symptoms she should let us know Follow-up in 1 year or sooner if needed     Ward Givens, MSN, NP-C 02/17/2021, 2:20 PM [facility] [address] [place],  [address] [phone]

## 2021-02-23 DIAGNOSIS — S52532A Colles' fracture of left radius, initial encounter for closed fracture: Secondary | ICD-10-CM | POA: Diagnosis not present

## 2021-03-01 DIAGNOSIS — S52532A Colles' fracture of left radius, initial encounter for closed fracture: Secondary | ICD-10-CM | POA: Diagnosis not present

## 2021-03-15 ENCOUNTER — Emergency Department: Payer: PPO

## 2021-03-15 ENCOUNTER — Encounter: Payer: Self-pay | Admitting: Emergency Medicine

## 2021-03-15 ENCOUNTER — Emergency Department
Admission: EM | Admit: 2021-03-15 | Discharge: 2021-03-15 | Disposition: A | Discharge status: Home / Self Care | Payer: PPO | Attending: Emergency Medicine | Admitting: Emergency Medicine

## 2021-03-15 ENCOUNTER — Other Ambulatory Visit: Payer: Self-pay

## 2021-03-15 DIAGNOSIS — I2510 Atherosclerotic heart disease of native coronary artery without angina pectoris: Secondary | ICD-10-CM | POA: Insufficient documentation

## 2021-03-15 DIAGNOSIS — Z7982 Long term (current) use of aspirin: Secondary | ICD-10-CM | POA: Insufficient documentation

## 2021-03-15 DIAGNOSIS — R9431 Abnormal electrocardiogram [ECG] [EKG]: Secondary | ICD-10-CM | POA: Diagnosis not present

## 2021-03-15 DIAGNOSIS — N390 Urinary tract infection, site not specified: Secondary | ICD-10-CM | POA: Diagnosis not present

## 2021-03-15 DIAGNOSIS — I10 Essential (primary) hypertension: Secondary | ICD-10-CM | POA: Insufficient documentation

## 2021-03-15 DIAGNOSIS — G20 Parkinson's disease: Secondary | ICD-10-CM | POA: Diagnosis not present

## 2021-03-15 DIAGNOSIS — R531 Weakness: Secondary | ICD-10-CM | POA: Diagnosis not present

## 2021-03-15 DIAGNOSIS — E860 Dehydration: Secondary | ICD-10-CM | POA: Diagnosis not present

## 2021-03-15 DIAGNOSIS — I672 Cerebral atherosclerosis: Secondary | ICD-10-CM | POA: Diagnosis not present

## 2021-03-15 DIAGNOSIS — R5383 Other fatigue: Secondary | ICD-10-CM | POA: Diagnosis not present

## 2021-03-15 LAB — TROPONIN I (HIGH SENSITIVITY): Troponin I (High Sensitivity): 5 ng/L (ref <18)

## 2021-03-15 LAB — URINALYSIS, ROUTINE W REFLEX MICROSCOPIC
Bilirubin Urine: NEGATIVE
Glucose, UA: NEGATIVE mg/dL
Ketones, ur: 5 mg/dL — AB
Leukocytes,Ua: NEGATIVE
Nitrite: POSITIVE — AB
Protein, ur: NEGATIVE mg/dL
Specific Gravity, Urine: 1.014 (ref 1.005–1.030)
pH: 5 (ref 5.0–8.0)

## 2021-03-15 LAB — BASIC METABOLIC PANEL
Anion gap: 6 (ref 5–15)
BUN: 12 mg/dL (ref 8–23)
CO2: 28 mmol/L (ref 22–32)
Calcium: 9.3 mg/dL (ref 8.9–10.3)
Chloride: 103 mmol/L (ref 98–111)
Creatinine, Ser: 0.96 mg/dL (ref 0.44–1.00)
GFR, Estimated: 59 mL/min — ABNORMAL LOW (ref >60)
Glucose, Bld: 108 mg/dL — ABNORMAL HIGH (ref 70–99)
Potassium: 4.1 mmol/L (ref 3.5–5.1)
Sodium: 137 mmol/L (ref 135–145)

## 2021-03-15 LAB — CBC
HCT: 39.4 % (ref 36.0–46.0)
Hemoglobin: 13.1 g/dL (ref 12.0–15.0)
MCH: 28.7 pg (ref 26.0–34.0)
MCHC: 33.2 g/dL (ref 30.0–36.0)
MCV: 86.4 fL (ref 80.0–100.0)
Platelets: 148 10*3/uL — ABNORMAL LOW (ref 150–400)
RBC: 4.56 MIL/uL (ref 3.87–5.11)
RDW: 13.1 % (ref 11.5–15.5)
WBC: 5.2 10*3/uL (ref 4.0–10.5)
nRBC: 0 % (ref 0.0–0.2)

## 2021-03-15 MED ORDER — AMOXICILLIN 500 MG PO CAPS: 500.0000 mg | ORAL_CAPSULE | Freq: Two times a day (BID) | ORAL | 0 refills | Status: DC

## 2021-03-15 MED ORDER — SODIUM CHLORIDE 0.9 % IV BOLUS
500.0000 mL | Freq: Once | INTRAVENOUS | Status: AC
  Administered 2021-03-15: 18:00:00 500 mL via INTRAVENOUS

## 2021-03-15 MED ORDER — AMOXICILLIN 500 MG PO CAPS
500.0000 mg | ORAL_CAPSULE | ORAL | Status: AC
  Administered 2021-03-15: 21:00:00 500 mg via ORAL
  Filled 2021-03-15: qty 1

## 2021-03-15 NOTE — ED Provider Notes (Signed)
[facility] Emergency Department Provider Note ____________________________________________   Event Date/Time   First MD Initiated Contact with Patient 03/15/21 1540     (approximate)  I have reviewed the triage vital signs and the nursing notes.  HISTORY  Chief Complaint Weakness (/)  HPI Stephanie Bell is a 80 y.o. female history of Parkinson's disease prior surgery for hiatal hernia  She has frequent urinary tract infections and is on daily Bactrim as well  Yesterday she reports that he had a very busy day with family and friends, this morning when she got up she just felt very fatigued and weak.  She just feels like she has no energy as the she has no strength mostly notable in her legs.  She is able to move them pick them up she just feels fatigued and tired.  Feels slightly dehydrated  Has a history of frequent urinary tract infections but reports she is not noticing any burning or discomfort with urination at present.  No fevers no chest pain.  Feels a bit fatigued,  Denies any trouble speaking no headache no back pain.  No numbness or weakness anywhere except she just feels like she does not have any energy in her lower legs also a little bit in her upper arms but mostly in the lower legs  Past Medical History:  Diagnosis Date   ADD (attention deficit disorder)    Anemia    Esophageal stricture 11/04/2014   GERD (gastroesophageal reflux disease)    H/O hiatal hernia    Hx: UTI (urinary tract infection)    Interstitial cystitis    Kidney stone    Lumbago    Palpitations 02/2017   Paraesophageal hernia    Parkinson disease ([place])    Parkinson's disease (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/01/2015   Lacunar stroke 05/14/2015   Elevated troponin 01/22/2015   Esophageal stricture 11/04/2014   GERD (gastroesophageal reflux disease) 11/04/2014   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   Hand pain 05/01/2013   Sebaceous cyst of breast 12/20/2011   S/P Nissen fundoplication (without gastrostomy tube) procedure 06/08/2011    Past Surgical History:  Procedure Laterality Date   BACK SURGERY  05-09-11   x 2 lumbar   CHOLECYSTECTOMY  05-09-11   laparoscopic   COLONOSCOPY  2002, 2008    rectal hyperplastic polyp 2002, diverticulosis 2008   ESOPHAGOGASTRODUODENOSCOPY     HERNIA REPAIR  05/16/2011   hiatal hernia   HIATAL HERNIA REPAIR  02/09/2012   Procedure: LAPAROSCOPIC REPAIR OF HIATAL HERNIA;  Surgeon: Pedro Earls, MD;  Location: [facility];  Service: General;  Laterality: N/A;   INSERTION OF MESH  02/09/2012   Procedure: INSERTION OF MESH;  Surgeon: Pedro Earls, MD;  [facility];  Service: General;;   IRRIGATION AND DEBRIDEMENT SEBACEOUS CYST  05/16/2011   Procedure: IRRIGATION AND  DEBRIDEMENT SEBACEOUS CYST;  Surgeon: Pedro Earls, MD;  [facility]: [facility];  Service: General;  Laterality: N/A;  middle of chest on sternum   LAPAROSCOPIC NISSEN FUNDOPLICATION  04/11/4823   Procedure: LAPAROSCOPIC NISSEN FUNDOPLICATION;  Surgeon: Pedro Earls, MD;  [facility];  Service: General;  Laterality: N/A;  will also remove sebaceous cyst of chest wall   LAPAROSCOPIC NISSEN FUNDOPLICATION  00/37/0488   Procedure: LAPAROSCOPIC NISSEN FUNDOPLICATION;  Surgeon: Pedro Earls, MD;  [facility];  Service: General;  Laterality: N/A;  Re-do  Laparoscopic Nissen    SEPTOPLASTY     TONSILLECTOMY  05-09-11   Tonsillectomy   UMBILICAL HERNIA REPAIR  05/16/2011   Procedure: HERNIA REPAIR UMBILICAL ADULT;  Surgeon: Pedro Earls, MD;  [facility];  Service: General;  Laterality: N/A;  with mesh    UPPER GASTROINTESTINAL ENDOSCOPY     VAGINAL HYSTERECTOMY      Prior to Admission medications   Medication Sig Start Date End Date Taking? Authorizing Provider  amoxicillin (AMOXIL) 500 MG capsule Take 1 capsule (500 mg total) by mouth 2 (two) times daily. 03/15/21  Yes Delman Kitten, MD  aspirin EC 81 MG tablet Take 81 mg by mouth daily.    [provider]  carbidopa-levodopa (SINEMET IR) 25-100 MG tablet 1 tablet in the morning, May also take 1/2 to 1 tablet at midday and in the evening 08/11/20   Melvenia Beam, MD  Cholecalciferol (VITAMIN D3) 5000 UNITS CAPS Take 5,000 Units by mouth once a week.     [provider]  Cranberry-Vitamin C 84-20 MG CAPS Take 2 capsules by mouth daily.    [provider]  Cyanocobalamin (VITAMIN B-12 PO) Take 1,000 mcg by mouth daily.     [provider]  methylphenidate (RITALIN) 10 MG tablet Take 10 mg by mouth daily as needed. 02/23/20   [provider]  mirabegron ER (MYRBETRIQ) 25 MG TB24 tablet Take 1 tablet (25 mg total) by mouth daily. 06/14/20   Vaillancourt, Aldona Bar, PA-C  nystatin ointment (MYCOSTATIN) Apply topically.    [provider]  ondansetron (ZOFRAN ODT) 4 MG disintegrating tablet Dissolve 1 tablet on the tongue every 6 hours as needed for nausea. 03/26/18   Esterwood, Amy S, PA-C  sertraline (ZOLOFT) 25 MG tablet Take 25 mg by mouth daily. 12/31/18   [provider]    Allergies Benzocaine, Epinephrine hcl (nasal), Other, Omeprazole, and Sulfa antibiotics  Family History  Problem Relation Age of Onset   Alzheimer's disease Mother    Stroke Mother    Diabetes Mother    Bipolar disorder Mother    Ulcers Father     Alcohol abuse Father    Ulcers Paternal Grandmother    Colon cancer Neg Hx    Rectal cancer Neg Hx    Stomach cancer Neg Hx    Esophageal cancer Neg Hx    Parkinson's disease Neg Hx     Social History Social History   Tobacco Use   Smoking status: Never   Smokeless tobacco: Never  Vaping Use   Vaping Use: Never used  Substance Use Topics   Alcohol use: Yes    Alcohol/week: 4.0 standard drinks    Types: 4 Glasses of wine per week   Drug use: No    Review of Systems Constitutional: No fever/chills Eyes: No visual changes. ENT: No sore throat.  No neck pain Cardiovascular: Denies chest pain. Respiratory: Denies shortness of breath now, but she  felt a little short of breath or perhaps little winded or fatigued earlier. Gastrointestinal: No abdominal pain.  Had nausea earlier but that happens frequently with her Parkinson's.  No vomiting. Genitourinary: Negative for dysuria. Musculoskeletal: Negative for back pain. Skin: Negative for rash. Neurological: Negative for headaches, areas of focal weakness or numbness.  Feels weak in both legs predominantly    ____________________________________________   PHYSICAL EXAM:  VITAL SIGNS: ED Triage Vitals  Enc Vitals Group     BP 03/15/21 1010 113/81     Pulse Rate 03/15/21 1010 79     Resp 03/15/21 1010 20     Temp 03/15/21 1010 97.8 F (36.6 C)     Temp Source 03/15/21 1424 Oral     SpO2 03/15/21 1010 97 %     Weight 03/15/21 1000 163 lb 2.3 oz (74 kg)     Height 03/15/21 1000 5\' 5"  (1.651 m)     Head Circumference --      Peak Flow --      Pain Score 03/15/21 1000 0     Pain Loc --      Pain Edu? --      Excl. in [place]? --     Constitutional: Alert and oriented. Well appearing and in no acute distress.  Parkinson's features.  Tremulousness particularly on the left side.  Reports she last took her Parkinson's medications yesterday, I did assist her and her husband in taking her home Sinemet dosing when I saw her. Eyes:  Conjunctivae are normal. Head: Atraumatic. Nose: No congestion/rhinnorhea. Mouth/Throat: Mucous membranes are moist. Neck: No stridor.  Cardiovascular: Normal rate, regular rhythm. Grossly normal heart sounds.  Good peripheral circulation. Respiratory: Normal respiratory effort.  No retractions. Lungs CTAB. Gastrointestinal: Soft and nontender. No distention. Musculoskeletal: No lower extremity tenderness nor edema.  Moves all extremities with 5 out of 5 strength, but does seem generally weak in her lower extremities but no focal deficit. Neurologic:  Normal speech and language. No gross focal neurologic deficits are appreciated.  Speech pattern is somewhat slow and intentional.  Facial features similar to Parkinson's Skin:  Skin is warm, dry and intact. No rash noted. Psychiatric: Mood and affect are normal. Speech and behavior are normal.  ____________________________________________   LABS (all labs ordered are listed, but only abnormal results are displayed)  Labs Reviewed  BASIC METABOLIC PANEL - Abnormal; Notable for the following components:      Result Value   Glucose, Bld 108 (*)    GFR, Estimated 59 (*)    All other components within normal limits  CBC - Abnormal; Notable for the following components:   Platelets 148 (*)    All other components within normal limits  URINALYSIS, ROUTINE W REFLEX MICROSCOPIC - Abnormal; Notable for the following components:   Color, Urine AMBER (*)    APPearance HAZY (*)    Hgb urine dipstick SMALL (*)    Ketones, ur 5 (*)    Nitrite POSITIVE (*)    Bacteria, UA MANY (*)    All other components within normal limits  CBG MONITORING, ED  TROPONIN I (HIGH SENSITIVITY)   ____________________________________________  EKG  Is reviewed and interpreted by me at 1030 Heart rate 80 QRS 89 QTc 440 Normal sinus rhythm, mild ST segment abnormality including slight ST segment depressions notable in V5 V6 V4.  Of note though the patient denies  any chest pain.  Says a somewhat similar appearance to her EKG from November 2020 ____________________________________________  RADIOLOGY  DG Chest 2 View  Result Date: 03/15/2021 CLINICAL DATA:  fatigue, weakness, unclear cause EXAM: CHEST - 2 VIEW COMPARISON:  03/04/2017. FINDINGS: Chronic streaky left basilar opacities. No new consolidation. No visible pleural effusions or pneumothorax. Cardiomediastinal silhouette is within normal limits and similar to prior. No evidence of acute osseous abnormality. Osteopenia. IMPRESSION: Chronic streaky left basilar opacities, favor atelectasis/scar. Electronically Signed   By: Margaretha Sheffield M.D.   On: 03/15/2021 17:30   CT HEAD WO CONTRAST (5MM)  Result Date: 03/15/2021 CLINICAL DATA:  Motor neuron disease suspected. Lower leg weakness. History of Parkinson's. EXAM: CT HEAD WITHOUT CONTRAST TECHNIQUE: Contiguous axial images were obtained from the base of the skull through the vertex without intravenous contrast. COMPARISON:  12/12/2020 FINDINGS: Brain: Diffuse cerebral atrophy. Ventricular dilatation consistent with central atrophy. Low-attenuation changes in the deep white matter consistent with small vessel ischemia. No abnormal extra-axial fluid collections. No mass effect or midline shift. Gray-white matter junctions are distinct. Basal cisterns are not effaced. No acute intracranial hemorrhage. Vascular: Moderate intracranial arterial vascular calcifications. Skull: Calvarium appears intact. Sinuses/Orbits: Paranasal sinuses and mastoid air cells are clear. Other: None. IMPRESSION: No acute intracranial abnormalities. Mild chronic atrophy and small vessel ischemic changes, similar to prior study. Electronically Signed   By: Lucienne Capers M.D.   On: 03/15/2021 17:34     Chest x-ray reviewed negative for acute finding.  CT head reviewed negative for acute finding. ____________________________________________   PROCEDURES  Procedure(s)  performed: None  Procedures  Critical Care performed: No  ____________________________________________   INITIAL IMPRESSION / ASSESSMENT AND PLAN / ED COURSE  Pertinent labs & imaging results that were available during my care of the patient were reviewed by me and considered in my medical decision making (see chart for details).   Patient presents for weakness primarily in her lower extremities.  He has bilateral do not have any associate acute neurologic findings such as diminished sensation or associated neck or back pain.  She does have Parkinson's and a parkinsonian tremor.  Does report feeling slightly thirsty all dehydrated.  No obvious urinary symptoms.  No obvious infectious symptoms.  Reports she felt slightly short of breath this morning, will obtain chest x-ray to evaluate for infiltrate.  No chest pain.  No hypoxia no tachycardia, no signs or symptoms suggest DVT PE acute ACS, central neurologic cause etc.  Given the patient's symptoms though will obtain a CT of the head to evaluate for gross abnormality or potential acute stroke, though I find this to be unlikely.  Suspect possibly secondary to her Parkinson's disease  Labs to this point reviewed very reassuring with normal metabolic panel normal CBC except for minimal thrombocytopenia  Clinical Course as of 03/15/21 1958  Tue Mar 15, 2021  1800 Patient was able to ambulate back and forth to the bathroom without difficulty.  Reassuring [MQ]  1912 Given the patient's normal troponin, EKG with abnormality that appears to be chronic, lack of chest pain I see no evidence of ACS.  Currently awaiting urinalysis.  In the interim patient has been up walk back and forth on the hallway to the bathroom, and she seems to have good strength and feeling improved after receiving fluids.  Blood pressure improved.  If urinalysis is normal I would suspect possibly some mild dehydration would be responsible for her symptomatology. [MQ]    Clinical  Course User Index [MQ] Delman Kitten, MD   ----------------------------------------- 7:42 PM on 03/15/2021 ----------------------------------------- Patient's work-up very reassuring to this point  forward both she and her husband contacted me asking if they could be discharged as they feel that they were ready to go home.  She has been up and ambulated feels much improved looks well no distress normal vital signs normal work of breathing fully alert.  Will discharge, they will be following up with her primary care physician.  ----------------------------------------- 7:58 PM on 03/15/2021 ----------------------------------------- Urinalysis returned, bacteria and leukocytes notable suggestive of urinary tract infection.  May be early.  She is on preventative daily Bactrim, review of her old culture suggest pansensitive E. coli.  We will place patient on amoxicillin 500 mg twice daily recommend follow-up closely with her primary care doctor.  Both she and her husband agreeable with this and return precautions  ____________________________________________   FINAL CLINICAL IMPRESSION(S) / ED DIAGNOSES  Final diagnoses:  Generalized weakness  Mild dehydration  Urinary tract infection, acute        Note:  This document was prepared using Dragon voice recognition software and may include unintentional dictation errors       Delman Kitten, MD 03/15/21 1959

## 2021-03-15 NOTE — ED Triage Notes (Signed)
Presents with family with weakness  hx of parkinson's   states she was not able to get up this am  states both legs are weak

## 2021-04-05 DIAGNOSIS — L720 Epidermal cyst: Secondary | ICD-10-CM | POA: Diagnosis not present

## 2021-04-21 DIAGNOSIS — R399 Unspecified symptoms and signs involving the genitourinary system: Secondary | ICD-10-CM | POA: Diagnosis not present

## 2021-04-21 DIAGNOSIS — E785 Hyperlipidemia, unspecified: Secondary | ICD-10-CM | POA: Diagnosis not present

## 2021-04-21 DIAGNOSIS — R110 Nausea: Secondary | ICD-10-CM | POA: Diagnosis not present

## 2021-04-21 DIAGNOSIS — Z0000 Encounter for general adult medical examination without abnormal findings: Secondary | ICD-10-CM | POA: Diagnosis not present

## 2021-04-21 DIAGNOSIS — G20 Parkinson's disease: Secondary | ICD-10-CM | POA: Diagnosis not present

## 2021-04-21 DIAGNOSIS — I10 Essential (primary) hypertension: Secondary | ICD-10-CM | POA: Diagnosis not present

## 2021-04-21 DIAGNOSIS — I700 Atherosclerosis of aorta: Secondary | ICD-10-CM | POA: Diagnosis not present

## 2021-05-05 ENCOUNTER — Ambulatory Visit: Payer: PPO | Admitting: Urology

## 2021-05-05 ENCOUNTER — Other Ambulatory Visit: Payer: Self-pay

## 2021-05-05 ENCOUNTER — Encounter: Payer: Self-pay | Admitting: Urology

## 2021-05-05 VITALS — BP 82/52 | HR 82 | Ht 65.0 in | Wt 154.0 lb

## 2021-05-05 DIAGNOSIS — N390 Urinary tract infection, site not specified: Secondary | ICD-10-CM

## 2021-05-05 LAB — URINALYSIS, COMPLETE
Bilirubin, UA: NEGATIVE
Glucose, UA: NEGATIVE
Leukocytes,UA: NEGATIVE
Nitrite, UA: NEGATIVE
RBC, UA: NEGATIVE
Specific Gravity, UA: 1.025 (ref 1.005–1.030)
Urobilinogen, Ur: 1 mg/dL (ref 0.2–1.0)
pH, UA: 6 (ref 5.0–7.5)

## 2021-05-05 LAB — MICROSCOPIC EXAMINATION: RBC, Urine: NONE SEEN /hpf (ref 0–2)

## 2021-05-05 MED ORDER — TRIMETHOPRIM 100 MG PO TABS: 100.0000 mg | ORAL_TABLET | Freq: Every day | ORAL | 2 refills | Status: DC

## 2021-05-05 NOTE — Progress Notes (Signed)
05/05/2021 3:25 PM   Stephanie Bell 03-05-1941 [id number]  Referring provider: Maryland Pink, MD [address] [place],  [place] [address]  Chief Complaint  Patient presents with   Recurrent UTI    HPI: 81 y.o. female presents for follow-up of recurrent UTI.  Last seen 12/02/2020 by Debroah Loop for dysuria Urinalysis with significant pyuria and urine culture + E. 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 Urine culture grew pansensitive E. coli and she was started on Ceftin however had significant abdominal cramping and discontinued after 1 day.  Rx Cipro was sent in which she just completed and is asymptomatic.   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    Surgical History: Past Surgical History:  Procedure Laterality Date   BACK SURGERY  05-09-11   x 2 lumbar   CHOLECYSTECTOMY  05-09-11   laparoscopic   COLONOSCOPY  2002, 2008    rectal hyperplastic polyp 2002, diverticulosis 2008   ESOPHAGOGASTRODUODENOSCOPY     HERNIA REPAIR  05/16/2011   hiatal hernia   HIATAL HERNIA REPAIR  02/09/2012   Procedure: LAPAROSCOPIC REPAIR OF HIATAL HERNIA;  Surgeon: Pedro Earls, MD;  [facility];  Service: General;  Laterality: N/A;    INSERTION OF MESH  02/09/2012   Procedure: INSERTION OF MESH;  Surgeon: Pedro Earls, MD;  [facility];  Service: General;;   IRRIGATION AND DEBRIDEMENT SEBACEOUS CYST  05/16/2011   Procedure: IRRIGATION AND DEBRIDEMENT SEBACEOUS CYST;  Surgeon: Pedro Earls, MD;  [facility];  Service: General;  Laterality: N/A;  middle of chest on sternum   LAPAROSCOPIC NISSEN FUNDOPLICATION  06/20/4740   Procedure: LAPAROSCOPIC NISSEN FUNDOPLICATION;  Surgeon: Pedro Earls, MD;  [facility];  Service: General;  Laterality: N/A;  will also remove sebaceous cyst of chest wall   LAPAROSCOPIC NISSEN FUNDOPLICATION  59/56/3875   Procedure: LAPAROSCOPIC NISSEN FUNDOPLICATION;  Surgeon: Pedro Earls, MD;  [facility];  Service: General;  Laterality: N/A;  Re-do Laparoscopic Nissen    SEPTOPLASTY     TONSILLECTOMY  05-09-11   Tonsillectomy   UMBILICAL HERNIA REPAIR  05/16/2011   Procedure: HERNIA REPAIR UMBILICAL ADULT;  Surgeon: Pedro Earls, MD;  [facility];  Service: General;  Laterality: N/A;  with mesh    UPPER GASTROINTESTINAL ENDOSCOPY     VAGINAL HYSTERECTOMY      Home Medications:  Allergies as of 05/05/2021       Reactions   Benzocaine Palpitations   Specific agent was Scandonest 2 %   Epinephrine Hcl (nasal) Palpitations, Other (See Comments)   Reaction:  Tachycardia    Other Other (See Comments), Palpitations   EPINEPHRINE Reaction:  Tachycardia    Cefuroxime Axetil Nausea Only   Omeprazole Diarrhea   Sulfa Antibiotics Swelling, Rash        Medication List        Accurate as of May 05, 2021  3:25 PM. If you have any questions, ask your nurse or doctor.          STOP taking these medications    amoxicillin 500 MG capsule Commonly known as: AMOXIL Stopped by: Abbie Sons, MD       TAKE these medications    aspirin EC 81 MG tablet Take 81 mg by mouth daily.   carbidopa-levodopa 25-100 MG tablet Commonly known as: SINEMET  IR 1 tablet in the morning, May also take 1/2 to 1 tablet at midday and in the evening   ciprofloxacin 250 MG tablet Commonly known as: CIPRO Take 250 mg by mouth 2 (two) times daily.   Cranberry-Vitamin C 84-20 MG Caps Take 2 capsules by mouth daily.   methylphenidate 10 MG tablet Commonly known as: RITALIN Take 10 mg by mouth daily as needed.   mirabegron ER 25 MG Tb24 tablet Commonly known as: MYRBETRIQ Take 1 tablet (25 mg total) by mouth daily.   nystatin ointment Commonly known as: MYCOSTATIN Apply topically.   ondansetron 4 MG disintegrating tablet Commonly known as: Zofran ODT Dissolve 1 tablet on the tongue every 6 hours as needed for nausea.   sertraline 25 MG tablet Commonly known as: ZOLOFT Take 25 mg by mouth daily.   VITAMIN B-12 PO Take 1,000 mcg by mouth daily.   Vitamin D3 125 MCG (5000 UT) Caps Take 5,000 Units by mouth once a week.        Allergies:  Allergies  Allergen Reactions   Benzocaine Palpitations    Specific agent was Scandonest 2 %   Epinephrine Hcl (Nasal) Palpitations and Other (See Comments)    Reaction:  Tachycardia    Other Other (See Comments) and Palpitations    EPINEPHRINE Reaction:  Tachycardia    Cefuroxime Axetil Nausea Only   Omeprazole Diarrhea   Sulfa Antibiotics Swelling and Rash    Family History: Family History  Problem Relation Age of Onset   Alzheimer's disease Mother    Stroke Mother    Diabetes Mother    Bipolar disorder Mother    Ulcers Father    Alcohol abuse Father    Ulcers Paternal Grandmother    Colon cancer Neg Hx    Rectal cancer Neg Hx    Stomach cancer Neg Hx    Esophageal cancer Neg Hx    Parkinson's disease Neg Hx     Social History:  reports that she has never smoked. She has never used smokeless tobacco. She reports current alcohol use of about 4.0 standard drinks per week. She reports that she does not use drugs.   Physical Exam: BP (!) 82/52    Pulse 82    Ht 5\' 5"  (1.651 m)     Wt 154 lb (69.9 kg)    BMI 25.63 kg/m   Constitutional:  Alert and oriented, No acute distress. HEENT: [place] AT, moist mucus membranes.  Trachea midline, no masses. Cardiovascular: No clubbing, cyanosis, or edema. Respiratory: Normal respiratory effort, no increased work of breathing. Psychiatric: Normal mood and affect.  Laboratory Data:  Urinalysis Dipstick-negative blood/leukocytes/nitrites Microscopy negative    Assessment & Plan:    1. Recurrent UTI Recently with recurrent UTI symptoms Desires to go back on low-dose prophylaxis and Rx trimethoprim sent  to pharmacy Urine culture repeated today    Abbie Sons, [facility] [address] [place], [place] [address] [phone]

## 2021-05-05 NOTE — Progress Notes (Signed)
In and Out Catheterization  Patient is present today for a I & O catheterization due to recurrent uti. Patient was cleaned and prepped in a sterile fashion with betadine . A 14FR cath was inserted no complications were noted , 34ml of urine return was noted, urine was yellow in color. A clean urine sample was collected for UA,UCX. Bladder was drained  And catheter was removed with out difficulty.    Performed by: Elberta Leatherwood, [place]

## 2021-05-06 ENCOUNTER — Telehealth: Payer: Self-pay | Admitting: *Deleted

## 2021-05-06 NOTE — Telephone Encounter (Signed)
Pt calling asking for urine culture results, advised pt results not back and we will call when they come.

## 2021-05-11 LAB — CULTURE, URINE COMPREHENSIVE

## 2021-06-22 ENCOUNTER — Other Ambulatory Visit: Payer: Self-pay | Admitting: Physician Assistant

## 2021-06-22 DIAGNOSIS — R3915 Urgency of urination: Secondary | ICD-10-CM

## 2021-07-08 ENCOUNTER — Emergency Department: Payer: PPO

## 2021-07-08 ENCOUNTER — Emergency Department
Admission: EM | Admit: 2021-07-08 | Discharge: 2021-07-08 | Disposition: A | Discharge status: Home / Self Care | Payer: PPO | Attending: Emergency Medicine | Admitting: Emergency Medicine

## 2021-07-08 DIAGNOSIS — M25551 Pain in right hip: Secondary | ICD-10-CM | POA: Diagnosis not present

## 2021-07-08 DIAGNOSIS — G20 Parkinson's disease: Secondary | ICD-10-CM | POA: Diagnosis not present

## 2021-07-08 DIAGNOSIS — S3992XA Unspecified injury of lower back, initial encounter: Secondary | ICD-10-CM | POA: Diagnosis present

## 2021-07-08 DIAGNOSIS — M25562 Pain in left knee: Secondary | ICD-10-CM | POA: Insufficient documentation

## 2021-07-08 DIAGNOSIS — M545 Low back pain, unspecified: Secondary | ICD-10-CM | POA: Diagnosis not present

## 2021-07-08 DIAGNOSIS — I10 Essential (primary) hypertension: Secondary | ICD-10-CM | POA: Insufficient documentation

## 2021-07-08 DIAGNOSIS — Y92410 Unspecified street and highway as the place of occurrence of the external cause: Secondary | ICD-10-CM | POA: Insufficient documentation

## 2021-07-08 DIAGNOSIS — M25561 Pain in right knee: Secondary | ICD-10-CM | POA: Diagnosis not present

## 2021-07-08 DIAGNOSIS — M25511 Pain in right shoulder: Secondary | ICD-10-CM

## 2021-07-08 MED ORDER — IBUPROFEN 800 MG PO TABS
800.0000 mg | ORAL_TABLET | Freq: Once | ORAL | Status: AC
  Administered 2021-07-08: 800 mg via ORAL
  Filled 2021-07-08: qty 1

## 2021-07-08 MED ORDER — CYCLOBENZAPRINE HCL 10 MG PO TABS: 10.0000 mg | ORAL_TABLET | Freq: Three times a day (TID) | ORAL | 0 refills | Status: AC | PRN

## 2021-07-08 MED ORDER — CYCLOBENZAPRINE HCL 10 MG PO TABS
5.0000 mg | ORAL_TABLET | Freq: Once | ORAL | Status: AC
  Administered 2021-07-08: 5 mg via ORAL
  Filled 2021-07-08: qty 1
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
## 2021-07-08 NOTE — ED Notes (Signed)
Patient ambulated with a steady gait with a walker in the hallway. Patient declined any increase in her pain. Dr. Cheri Fowler is aware. ?
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
## 2021-07-08 NOTE — ED Notes (Signed)
Called x1 in lobby. Pt may be away at imaging. Will attempt again soon.  ?
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
## 2021-07-08 NOTE — Discharge Instructions (Signed)
Please use ibuprofen (Motrin) up to 800 mg every 8 hours, naproxen (Naprosyn) up to 500 mg every 12 hours, and/or acetaminophen (Tylenol) up to 4 g/day for any continued pain ?You may cut the prescribed Flexeril (cyclobenzaprine) in half as needed ?

## 2021-07-08 NOTE — ED Notes (Signed)
Patient declined discharge vital signs. 

## 2021-07-12 DIAGNOSIS — R0781 Pleurodynia: Secondary | ICD-10-CM | POA: Diagnosis not present

## 2021-07-19 ENCOUNTER — Ambulatory Visit: Payer: PPO | Admitting: Physician Assistant

## 2021-07-19 ENCOUNTER — Encounter: Payer: Self-pay | Admitting: Physician Assistant

## 2021-07-19 VITALS — BP 126/78 | HR 69 | Ht 65.0 in | Wt 147.0 lb

## 2021-07-19 DIAGNOSIS — N390 Urinary tract infection, site not specified: Secondary | ICD-10-CM | POA: Diagnosis not present

## 2021-07-19 DIAGNOSIS — Z87440 Personal history of urinary (tract) infections: Secondary | ICD-10-CM

## 2021-07-19 DIAGNOSIS — B078 Other viral warts: Secondary | ICD-10-CM | POA: Diagnosis not present

## 2021-07-19 LAB — MICROSCOPIC EXAMINATION: Bacteria, UA: NONE SEEN

## 2021-07-19 LAB — URINALYSIS, COMPLETE
Bilirubin, UA: NEGATIVE
Glucose, UA: NEGATIVE
Ketones, UA: NEGATIVE
Leukocytes,UA: NEGATIVE
Nitrite, UA: NEGATIVE
Protein,UA: NEGATIVE
RBC, UA: NEGATIVE
Specific Gravity, UA: >1.03 — ABNORMAL HIGH (ref 1.005–1.030)
Urobilinogen, Ur: 1 mg/dL (ref 0.2–1.0)
pH, UA: 5.5 (ref 5.0–7.5)

## 2021-07-19 MED ORDER — PREMARIN 0.625 MG/GM VA CREA: TOPICAL_CREAM | VAGINAL | 4 refills | Status: AC
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
## 2021-07-19 NOTE — Patient Instructions (Addendum)
STOP nystatin + triamcinolone cream ? ?CONTINUE Myrbetriq once daily ?CONTINUE trimethoprim once daily ?CONTINUE cranberry twice daily if you are still taking this. ? ?RESTART Premarin estrogen cream for UTI prevention. Apply one pea-sized amount around the opening of the urethra (where your urine comes out) three times weekly. ?

## 2021-07-28 ENCOUNTER — Other Ambulatory Visit: Payer: Self-pay | Admitting: Physician Assistant

## 2021-07-28 DIAGNOSIS — R3915 Urgency of urination: Secondary | ICD-10-CM

## 2021-09-13 ENCOUNTER — Other Ambulatory Visit: Payer: Self-pay | Admitting: Neurology

## 2021-09-13 ENCOUNTER — Telehealth: Payer: Self-pay | Admitting: Adult Health

## 2021-09-13 NOTE — Telephone Encounter (Signed)
Noted. I see refill request in other encounter. Refill sent to pharmacy. No changes made to instructions.

## 2021-09-18 ENCOUNTER — Emergency Department
Admission: EM | Admit: 2021-09-18 | Discharge: 2021-09-18 | Disposition: A | Discharge status: Home / Self Care | Payer: PPO | Attending: Emergency Medicine | Admitting: Emergency Medicine

## 2021-09-18 ENCOUNTER — Emergency Department: Payer: PPO

## 2021-09-18 ENCOUNTER — Other Ambulatory Visit: Payer: Self-pay

## 2021-09-18 DIAGNOSIS — R0602 Shortness of breath: Secondary | ICD-10-CM | POA: Insufficient documentation

## 2021-09-18 DIAGNOSIS — Z20822 Contact with and (suspected) exposure to covid-19: Secondary | ICD-10-CM | POA: Insufficient documentation

## 2021-09-18 DIAGNOSIS — R531 Weakness: Secondary | ICD-10-CM | POA: Insufficient documentation

## 2021-09-18 DIAGNOSIS — I10 Essential (primary) hypertension: Secondary | ICD-10-CM | POA: Diagnosis not present

## 2021-09-18 LAB — TROPONIN I (HIGH SENSITIVITY)
Troponin I (High Sensitivity): 4 ng/L (ref <18)
Troponin I (High Sensitivity): 5 ng/L (ref <18)

## 2021-09-18 LAB — URINALYSIS, ROUTINE W REFLEX MICROSCOPIC
Bilirubin Urine: NEGATIVE
Glucose, UA: NEGATIVE mg/dL
Hgb urine dipstick: NEGATIVE
Ketones, ur: NEGATIVE mg/dL
Leukocytes,Ua: NEGATIVE
Nitrite: NEGATIVE
Protein, ur: NEGATIVE mg/dL
Specific Gravity, Urine: 1.004 — ABNORMAL LOW (ref 1.005–1.030)
pH: 9 — ABNORMAL HIGH (ref 5.0–8.0)

## 2021-09-18 LAB — CBC WITH DIFFERENTIAL/PLATELET
Abs Immature Granulocytes: 0.01 10*3/uL (ref 0.00–0.07)
Basophils Absolute: 0 10*3/uL (ref 0.0–0.1)
Basophils Relative: 1 %
Eosinophils Absolute: 0 10*3/uL (ref 0.0–0.5)
Eosinophils Relative: 1 %
HCT: 42.3 % (ref 36.0–46.0)
Hemoglobin: 13.9 g/dL (ref 12.0–15.0)
Immature Granulocytes: 0 %
Lymphocytes Relative: 27 %
Lymphs Abs: 1.4 10*3/uL (ref 0.7–4.0)
MCH: 28 pg (ref 26.0–34.0)
MCHC: 32.9 g/dL (ref 30.0–36.0)
MCV: 85.3 fL (ref 80.0–100.0)
Monocytes Absolute: 0.4 10*3/uL (ref 0.1–1.0)
Monocytes Relative: 7 %
Neutro Abs: 3.4 10*3/uL (ref 1.7–7.7)
Neutrophils Relative %: 64 %
Platelets: 155 10*3/uL (ref 150–400)
RBC: 4.96 MIL/uL (ref 3.87–5.11)
RDW: 13.1 % (ref 11.5–15.5)
WBC: 5.3 10*3/uL (ref 4.0–10.5)
nRBC: 0 % (ref 0.0–0.2)

## 2021-09-18 LAB — COMPREHENSIVE METABOLIC PANEL
ALT: 9 U/L (ref 0–44)
AST: 16 U/L (ref 15–41)
Albumin: 4.3 g/dL (ref 3.5–5.0)
Alkaline Phosphatase: 57 U/L (ref 38–126)
Anion gap: 10 (ref 5–15)
BUN: 10 mg/dL (ref 8–23)
CO2: 25 mmol/L (ref 22–32)
Calcium: 9.5 mg/dL (ref 8.9–10.3)
Chloride: 104 mmol/L (ref 98–111)
Creatinine, Ser: 0.9 mg/dL (ref 0.44–1.00)
GFR, Estimated: >60 mL/min (ref >60)
Glucose, Bld: 99 mg/dL (ref 70–99)
Potassium: 3.9 mmol/L (ref 3.5–5.1)
Sodium: 139 mmol/L (ref 135–145)
Total Bilirubin: 1.6 mg/dL — ABNORMAL HIGH (ref 0.3–1.2)
Total Protein: 6.9 g/dL (ref 6.5–8.1)

## 2021-09-18 LAB — SARS CORONAVIRUS 2 BY RT PCR: SARS Coronavirus 2 by RT PCR: NEGATIVE

## 2021-09-18 LAB — BRAIN NATRIURETIC PEPTIDE: B Natriuretic Peptide: 28.4 pg/mL (ref 0.0–100.0)

## 2021-09-18 LAB — LIPASE, BLOOD: Lipase: 24 U/L (ref 11–51)

## 2021-09-18 MED ORDER — CARBIDOPA-LEVODOPA 25-100 MG PO TABS
1.0000 | ORAL_TABLET | Freq: Once | ORAL | Status: AC
  Administered 2021-09-18: 1 via ORAL
  Filled 2021-09-18: qty 1

## 2021-09-18 MED ORDER — SODIUM CHLORIDE 0.9 % IV BOLUS
1000.0000 mL | Freq: Once | INTRAVENOUS | Status: AC
  Administered 2021-09-18: 1000 mL via INTRAVENOUS

## 2021-09-18 NOTE — ED Notes (Signed)
Pt refused CT and advised she wanted to leave. Provider aware.

## 2021-09-18 NOTE — ED Triage Notes (Signed)
[facility] reports pt coming from home c/o generalized weakness that started this morning. Also c/o sob.

## 2021-09-18 NOTE — ED Provider Notes (Signed)
Patient told nursing staff that she wanted to be discharged, declined ct head. When I talked to the patient she stated she felt much better. Stated she did not want to stay for CT head and that she would like to be discharged.    Nance Pear, MD 09/18/21 Stephanie Bell

## 2021-09-18 NOTE — Discharge Instructions (Signed)
Please seek medical attention for any high fevers, chest pain, shortness of breath, change in behavior, persistent vomiting, bloody stool or any other new or concerning symptoms.  

## 2021-09-18 NOTE — ED Provider Notes (Signed)
[facility] Provider Note   Event Date/Time   First MD Initiated Contact with Patient 09/18/21 1243     (approximate) History  Weakness  HPI Stephanie Bell is a 81 y.o. female  Location: Generalized Duration: Began this morning upon awakening Timing: Stable since onset Severity: Severe Quality: Generalized weakness Context: Patient states that when she awoke this morning she states she was so weak that she was unable to get out of bed.  Patient states that she did not take her Parkinson's medication this morning Modifying factors: Denies any exacerbating or relieving factors Associated Symptoms: Denies ROS: Patient currently denies any vision changes, tinnitus, difficulty speaking, facial droop, sore throat, chest pain, shortness of breath, abdominal pain, nausea/vomiting/diarrhea, dysuria, or numbness/paresthesias in any extremity   Physical Exam  Triage Vital Signs: ED Triage Vitals  Enc Vitals Group     BP 09/18/21 1252 (!) 163/97     Pulse Rate 09/18/21 1252 75     Resp 09/18/21 1252 18     Temp 09/18/21 1252 98.2 F (36.8 C)     Temp Source 09/18/21 1252 Oral     SpO2 09/18/21 1252 100 %     Weight 09/18/21 1241 147 lb (66.7 kg)     Height 09/18/21 1241 '5\' 5"'$  (1.651 m)     Head Circumference --      Peak Flow --      Pain Score 09/18/21 1241 0     Pain Loc --      Pain Edu? --      Excl. in [place]? --    Most recent vital signs: Vitals:   09/18/21 1430 09/18/21 1500  BP: (!) 166/102 (!) 174/116  Pulse: 88 79  Resp: 17   Temp:    SpO2: 100% 100%   General: Awake, oriented x4. CV:  Good peripheral perfusion.  Resp:  Normal effort.  Abd:  No distention.  Other:  Elderly Caucasian female laying in bed in no acute distress.  4/5 strength and bilateral upper and lower extremities ED Results / Procedures / Treatments  Labs (all labs ordered are listed, but only abnormal results are displayed) Labs Reviewed  COMPREHENSIVE METABOLIC PANEL  - Abnormal; Notable for the following components:      Result Value   Total Bilirubin 1.6 (*)    All other components within normal limits  URINALYSIS, ROUTINE W REFLEX MICROSCOPIC - Abnormal; Notable for the following components:   Color, Urine YELLOW (*)    APPearance CLEAR (*)    Specific Gravity, Urine 1.004 (*)    pH 9.0 (*)    All other components within normal limits  SARS CORONAVIRUS 2 BY RT PCR  CBC WITH DIFFERENTIAL/PLATELET  LIPASE, BLOOD  BRAIN NATRIURETIC PEPTIDE  TROPONIN I (HIGH SENSITIVITY)  TROPONIN I (HIGH SENSITIVITY)   EKG ED ECG REPORT I, Naaman Plummer, the attending physician, personally viewed and interpreted this ECG. Date: 09/18/2021 EKG Time: 1245 Rate: 71 Rhythm: normal sinus rhythm QRS Axis: normal Intervals: normal ST/T Wave abnormalities: normal Narrative Interpretation: no evidence of acute ischemia RADIOLOGY ED MD interpretation: Pending -Agree with radiology assessment Official radiology report(s): No results found. PROCEDURES: Critical Care performed: No Procedures MEDICATIONS ORDERED IN ED: Medications  sodium chloride 0.9 % bolus 1,000 mL (0 mLs Intravenous Stopped 09/18/21 1506)  carbidopa-levodopa (SINEMET IR) 25-100 MG per tablet immediate release 1 tablet (1 tablet Oral Given 09/18/21 1504)   IMPRESSION / MDM / ASSESSMENT AND PLAN / ED COURSE  I reviewed the triage vital signs and the nursing notes.                             The patient is on the cardiac monitor to evaluate for evidence of arrhythmia and/or significant heart rate changes. Patient's presentation is most consistent with acute presentation with potential threat to life or bodily function. Patient is a an 81 year old female with a stated past medical history of Parkinson's disease who presents for generalized weakness that began upon awakening this morning.  Of note patient has not taken her Parkinson's medications this morning.  Differential diagnosis includes but is  not limited to CVA, UTI, sepsis, electrolyte abnormalities, dehydration, acute renal failure. CT imaging of the brain pending at the time of discharge Care of this patient will be signed out to the oncoming physician at the end of my shift.  All pertinent patient information conveyed and all questions answered.  All further care and disposition decisions will be made by the oncoming physician.   FINAL CLINICAL IMPRESSION(S) / ED DIAGNOSES   Final diagnoses:  None   Rx / DC Orders   ED Discharge Orders     None      Note:  This document was prepared using Dragon voice recognition software and may include unintentional dictation errors.   Naaman Plummer, MD 09/18/21 1524

## 2021-09-19 ENCOUNTER — Telehealth: Payer: Self-pay | Admitting: Adult Health

## 2021-09-19 NOTE — Telephone Encounter (Signed)
Pt said could not get out of bed yesterday shortness of breath, tired when moving around. Called EMS was taken to by ambulance to [facility]. Would like a call from the nurse.

## 2021-09-19 NOTE — Telephone Encounter (Signed)
I spoke with the patient.  She states yesterday morning she was weak and could not stand.  She had shortness of breath but she states she took a nasal decongestant medication that her husband had purchased over-the-counter and that helped.  She states she cannot remember if she could move her arms or legs at all or not but she knew she could not stand.  She states she went to the ER and everything checked out fine.  She does not want to go back to that same ER.  She states she was feeling better when she left.  She had declined a head CT.  She states this morning she is much better.  She reports this has been an isolated event as overall she feels her Parkinson's medications are working okay.  She would like to see what Dr. Jaynee Eagles thinks.  I let her know she would be back in the office on Wednesday and I could have her look over this.  Currently patient has an appointment with primary care early August.  I recommended she give them a call.  I told her if this happens again she needs to call 911 for emergency evaluation to rule out things like stroke, etc.  The patient verbalized appreciation and her questions were answered.

## 2021-09-19 NOTE — Telephone Encounter (Signed)
I called the pt and LVM for call back. Checked ER note. Pt left before head CT done and stated she was feeling better. She was told by the ER doctor to f/u with PCP.

## 2021-09-22 NOTE — Telephone Encounter (Signed)
Stephanie Beam, MD  [facility] Calls 3 hours ago (12:54 PM)    If she checked out ok at the ED and no more symptoms, I would avoid taking that medication again and see primary care in August. Thanks    I called the pt back and relayed recommendation. She verbalized understanding and appreciation for the call. Reports she is feeling well today, denies any symptoms like shortness of breath or trouble moving.

## 2021-10-10 ENCOUNTER — Telehealth: Payer: Self-pay | Admitting: Physician Assistant

## 2021-10-10 NOTE — Telephone Encounter (Signed)
Patient called the after hours about having burning with urination and frequency. I offered to schedule an appointment but she declined stating that she is taking AZO and Myrbetriq. I told her that those medications did not treat UTI's and that she would need to have her urine checked but she wanted to wait. I advised her to contact the office if her symptoms did not improve and make an appointment.  Sharyn Lull

## 2021-10-12 ENCOUNTER — Encounter (HOSPITAL_COMMUNITY): Payer: Self-pay

## 2021-10-12 ENCOUNTER — Emergency Department (HOSPITAL_COMMUNITY)
Admission: EM | Admit: 2021-10-12 | Discharge: 2021-10-12 | Discharge status: Against Medical Advice | Payer: PPO | Attending: Emergency Medicine | Admitting: Emergency Medicine

## 2021-10-12 ENCOUNTER — Other Ambulatory Visit: Payer: Self-pay

## 2021-10-12 DIAGNOSIS — Z5321 Procedure and treatment not carried out due to patient leaving prior to being seen by health care provider: Secondary | ICD-10-CM | POA: Diagnosis not present

## 2021-10-12 DIAGNOSIS — R350 Frequency of micturition: Secondary | ICD-10-CM | POA: Diagnosis not present

## 2021-10-12 DIAGNOSIS — R309 Painful micturition, unspecified: Secondary | ICD-10-CM | POA: Insufficient documentation

## 2021-10-12 LAB — URINALYSIS, ROUTINE W REFLEX MICROSCOPIC

## 2021-10-12 NOTE — ED Triage Notes (Signed)
Pt reports with painful urination and urinary frequency since Saturday.

## 2021-10-12 NOTE — ED Notes (Signed)
Pt called for in ED lobby for vitals reassessment- no answer x1. [employer]

## 2021-10-12 NOTE — ED Notes (Signed)
Pt left the facility 

## 2021-10-12 NOTE — ED Notes (Signed)
Pt called for in ED lobby for vitals reassessment- no answer x2. [employer]

## 2021-10-13 ENCOUNTER — Ambulatory Visit: Payer: PPO | Admitting: Physician Assistant

## 2021-10-13 DIAGNOSIS — N390 Urinary tract infection, site not specified: Secondary | ICD-10-CM | POA: Diagnosis not present

## 2021-10-13 MED ORDER — NITROFURANTOIN MONOHYD MACRO 100 MG PO CAPS: 100.0000 mg | ORAL_CAPSULE | Freq: Two times a day (BID) | ORAL | 0 refills | Status: DC

## 2021-10-13 NOTE — Progress Notes (Signed)
10/13/2021 3:12 PM   Stephanie Bell 10-Mar-1941 [id number]  CC: Chief Complaint  Patient presents with   Urinary Tract Infection   HPI: Stephanie Bell is a 81 y.o. female with PMH recurrent UTI previously on trimethoprim 100 mg daily, urinary urgency on Myrbetriq 25 mg, and Parkinson's disease who presents today for evaluation of possible UTI.   Today she reports a 5-day history of dysuria with her characteristic upper extremity "shocks" with voiding.  She has been taking Azo 4+ times daily for several days.  She denies fever, chills, nausea, or vomiting.  She states she ran out of her suppressive trimethoprim but does not know when this occurred.  In-office catheterized UA today positive for orange color, unable to read due to pigment interference; urine microscopy with 11-30 WBCs/HPF, nonspecific crystals, and many bacteria.  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    Surgical History: Past Surgical History:  Procedure Laterality Date   BACK SURGERY  05-09-11   x 2 lumbar   CHOLECYSTECTOMY  05-09-11   laparoscopic   COLONOSCOPY  2002, 2008    rectal hyperplastic polyp 2002, diverticulosis 2008   ESOPHAGOGASTRODUODENOSCOPY     HERNIA REPAIR  05/16/2011   hiatal hernia   HIATAL HERNIA REPAIR  02/09/2012   Procedure: LAPAROSCOPIC REPAIR OF HIATAL HERNIA;  Surgeon: Pedro Earls, MD;  Location: [facility];  Service: General;  Laterality: N/A;   INSERTION OF MESH   02/09/2012   Procedure: INSERTION OF MESH;  Surgeon: Pedro Earls, MD;  Location: [facility];  Service: General;;   IRRIGATION AND DEBRIDEMENT SEBACEOUS CYST  05/16/2011   Procedure: IRRIGATION AND DEBRIDEMENT SEBACEOUS CYST;  Surgeon: Pedro Earls, MD;  Location: [facility];  Service: General;  Laterality: N/A;  middle of chest on sternum   LAPAROSCOPIC NISSEN FUNDOPLICATION  1/69/6789   Procedure: LAPAROSCOPIC NISSEN FUNDOPLICATION;  Surgeon: Pedro Earls, MD;  Location: [facility];  Service: General;  Laterality: N/A;  will also remove sebaceous cyst of chest wall   LAPAROSCOPIC NISSEN FUNDOPLICATION  38/12/1749   Procedure: LAPAROSCOPIC NISSEN FUNDOPLICATION;  Surgeon: Pedro Earls, MD;  Location: [facility];  Service: General;  Laterality: N/A;  Re-do Laparoscopic Nissen    SEPTOPLASTY     TONSILLECTOMY  05-09-11   Tonsillectomy   UMBILICAL HERNIA REPAIR  05/16/2011   Procedure: HERNIA REPAIR UMBILICAL ADULT;  Surgeon: Pedro Earls, MD;  Location: [facility];  Service: General;  Laterality: N/A;  with mesh    UPPER GASTROINTESTINAL ENDOSCOPY     VAGINAL HYSTERECTOMY      Home Medications:  Allergies as of 10/13/2021       Reactions   Benzocaine Palpitations   Specific agent was Scandonest 2 %   Epinephrine Hcl (nasal) Palpitations, Other (See Comments)   Reaction:  Tachycardia    Other Other (See Comments), Palpitations   EPINEPHRINE Reaction:  Tachycardia    Cefuroxime Axetil Nausea Only   Omeprazole Diarrhea   Sulfa Antibiotics Swelling, Rash        Medication List        Accurate as of October 13, 2021  3:12 PM. If you have any questions, ask your nurse or doctor.          aspirin EC 81 MG tablet Take 81 mg by mouth daily.   carbidopa-levodopa 25-100 MG tablet Commonly known as: SINEMET IR 1 TABLET IN THE MORNING, MAY ALSO TAKE 1/2 TO 1 TABLET AT MIDDAY AND IN THE EVENING   ciprofloxacin 250 MG tablet Commonly known as: CIPRO Take 250 mg by mouth 2 (two) times  daily.   Cranberry-Vitamin C 84-20 MG Caps Take 2 capsules by mouth daily.   methylphenidate 10 MG tablet Commonly known as: RITALIN Take 10 mg by mouth daily as needed.   Myrbetriq 25 MG Tb24 tablet Generic drug: mirabegron ER TAKE 1 TABLET (25 MG TOTAL) BY MOUTH DAILY.   ondansetron 4 MG disintegrating tablet Commonly known as: Zofran ODT Dissolve 1 tablet on the tongue every 6 hours as needed for nausea.   Premarin vaginal cream Generic drug: conjugated estrogens Apply one pea-sized amount around the opening of the urethra daily for 2 weeks, then 3 times weekly moving forward.   sertraline 25 MG tablet Commonly known as: ZOLOFT Take 25 mg by mouth daily.   trimethoprim 100 MG tablet Commonly known as: TRIMPEX Take 1 tablet (100 mg total) by mouth daily.   VITAMIN B-12 PO Take 1,000 mcg by mouth daily.   Vitamin D3 125 MCG (5000 UT) Caps Take 5,000 Units by mouth once a week.        Allergies:  Allergies  Allergen Reactions   Benzocaine Palpitations    Specific agent was Scandonest 2 %   Epinephrine Hcl (Nasal) Palpitations and Other (See Comments)    Reaction:  Tachycardia    Other Other (See Comments) and Palpitations    EPINEPHRINE Reaction:  Tachycardia    Cefuroxime Axetil Nausea Only   Omeprazole Diarrhea   Sulfa Antibiotics Swelling and Rash    Family History: Family History  Problem Relation Age of Onset   Alzheimer's disease Mother    Stroke Mother    Diabetes Mother    Bipolar disorder Mother    Ulcers Father    Alcohol abuse Father    Ulcers Paternal Grandmother    Colon cancer Neg Hx    Rectal cancer Neg Hx    Stomach cancer Neg Hx    Esophageal cancer Neg Hx    Parkinson's disease Neg Hx     Social History:   reports that she has never smoked. She has never used smokeless tobacco. She reports current alcohol use of about 4.0 standard drinks of alcohol per week. She reports that she does not use drugs.  Physical Exam: There were  no vitals taken for this visit.  Constitutional:  Alert and oriented, no acute distress, nontoxic appearing HEENT: [place], AT Cardiovascular: No clubbing, cyanosis, or edema Respiratory: Normal respiratory effort, no increased work of breathing Skin: No rashes, bruises or suspicious lesions Neurologic: Grossly intact, no focal deficits, moving all 4 extremities Psychiatric: Normal mood and affect  Laboratory Data: Results for orders placed or performed in visit on 10/13/21  CULTURE, URINE COMPREHENSIVE   Specimen: Urine   UR  Result Value Ref Range   Urine Culture, Comprehensive Final report (A)    Organism ID, Bacteria  Escherichia coli (A)    Organism ID, Bacteria Klebsiella oxytoca (A)    ANTIMICROBIAL SUSCEPTIBILITY Comment   Microscopic Examination   Urine  Result Value Ref Range   WBC, UA 11-30 (A) 0 - 5 /hpf   RBC, Urine 0-2 0 - 2 /hpf   Epithelial Cells (non renal) 0-10 0 - 10 /hpf   Crystals Present (A) N/A   Crystal Type Comment (A) N/A   Bacteria, UA Many (A) None seen/Few  Urinalysis, Complete  Result Value Ref Range   Specific Gravity, UA CANCELED    pH, UA CANCELED    Color, UA Orange Yellow   Appearance Ur Cloudy (A) Clear   Protein,UA CANCELED    Glucose, UA CANCELED    Ketones, UA CANCELED    Microscopic Examination See below:    Assessment & Plan:   1. Recurrent UTI UA today notable for pyuria and bacteriuria.  Will start empiric Macrobid and send for culture for further evaluation.  May consider resuming suppressive trimethoprim if she begins having frequent infections again.  Counseled her not to take Azo for longer than 2 days continuously and to abide by the dosing frequency recommendations on the box, 3 times daily as needed.  We discussed that the medication can be harsh on the kidneys if taken too much.  She expressed understanding. - Urinalysis, Complete - CULTURE, URINE COMPREHENSIVE - nitrofurantoin, macrocrystal-monohydrate, (MACROBID) 100 MG  capsule; Take 1 capsule (100 mg total) by mouth 2 (two) times daily for 7 days.  Dispense: 14 capsule; Refill: 0   Return if symptoms worsen or fail to improve.  Debroah Loop, PA-C  [facility] [address] [place], [place] [address] [phone]

## 2021-10-14 ENCOUNTER — Ambulatory Visit: Payer: PPO | Admitting: Physician Assistant

## 2021-10-14 LAB — URINALYSIS, COMPLETE

## 2021-10-14 LAB — MICROSCOPIC EXAMINATION

## 2021-10-18 LAB — CULTURE, URINE COMPREHENSIVE

## 2021-10-19 ENCOUNTER — Telehealth: Payer: Self-pay

## 2021-10-19 DIAGNOSIS — G20 Parkinson's disease: Secondary | ICD-10-CM | POA: Diagnosis not present

## 2021-10-19 DIAGNOSIS — I10 Essential (primary) hypertension: Secondary | ICD-10-CM | POA: Diagnosis not present

## 2021-10-19 DIAGNOSIS — R634 Abnormal weight loss: Secondary | ICD-10-CM | POA: Diagnosis not present

## 2021-10-19 DIAGNOSIS — E785 Hyperlipidemia, unspecified: Secondary | ICD-10-CM | POA: Diagnosis not present

## 2021-10-19 DIAGNOSIS — K219 Gastro-esophageal reflux disease without esophagitis: Secondary | ICD-10-CM | POA: Diagnosis not present

## 2021-10-19 NOTE — Telephone Encounter (Signed)
-----   Message from Nori Riis, PA-C sent at 10/19/2021  9:32 AM EDT ----- Please let Mrs. Burr know that her urine culture was positive for infection and the nitrofurantoin is the appropriate antibiotic and to complete the prescription.

## 2021-10-19 NOTE — Telephone Encounter (Signed)
Notified pt as advised, pt expressed understanding.  ?

## 2021-10-20 NOTE — Telephone Encounter (Signed)
Please have her start Augmentin 875-'125mg'$  BID x7 days for persistent vs recurrent UTI. If her symptoms do not resolve, she needs to come in for repeat cath UA and consideration of imaging.

## 2021-10-20 NOTE — Telephone Encounter (Signed)
Patient called triage line requests a different antibiotic. Reviewed she has been having ongoing dysuria. Requests daily Trimethoprim.

## 2021-10-20 NOTE — Telephone Encounter (Signed)
Incoming call from pt on triage line who states that she completed her prescription for nitrofurantoin on Tuesday. She woke up today with severe dysuria and shooting pains in her arms bilaterally. She notes she has also had diarrhea which she believes to be a side effect from taking nitrofurantoin. She questions is she should began a different antibiotic. Please advise.

## 2021-10-21 MED ORDER — AMOXICILLIN-POT CLAVULANATE 875-125 MG PO TABS: 1.0000 | ORAL_TABLET | Freq: Two times a day (BID) | ORAL | 0 refills | Status: AC

## 2021-10-21 NOTE — Telephone Encounter (Signed)
Spoke with patient and advised results rx sent to pharmacy by e-script  

## 2021-10-21 NOTE — Addendum Note (Signed)
Addended by: Despina Hidden on: 10/21/2021 10:26 AM   Modules accepted: Orders

## 2021-11-04 ENCOUNTER — Emergency Department
Admission: EM | Admit: 2021-11-04 | Discharge: 2021-11-04 | Disposition: A | Discharge status: Home / Self Care | Payer: PPO | Attending: Emergency Medicine | Admitting: Emergency Medicine

## 2021-11-04 ENCOUNTER — Other Ambulatory Visit: Payer: Self-pay

## 2021-11-04 ENCOUNTER — Encounter: Payer: Self-pay | Admitting: Emergency Medicine

## 2021-11-04 DIAGNOSIS — R110 Nausea: Secondary | ICD-10-CM | POA: Insufficient documentation

## 2021-11-04 DIAGNOSIS — I10 Essential (primary) hypertension: Secondary | ICD-10-CM | POA: Insufficient documentation

## 2021-11-04 DIAGNOSIS — R630 Anorexia: Secondary | ICD-10-CM | POA: Diagnosis not present

## 2021-11-04 DIAGNOSIS — G20 Parkinson's disease: Secondary | ICD-10-CM | POA: Diagnosis not present

## 2021-11-04 DIAGNOSIS — R42 Dizziness and giddiness: Secondary | ICD-10-CM | POA: Insufficient documentation

## 2021-11-04 DIAGNOSIS — I2510 Atherosclerotic heart disease of native coronary artery without angina pectoris: Secondary | ICD-10-CM | POA: Insufficient documentation

## 2021-11-04 DIAGNOSIS — R531 Weakness: Secondary | ICD-10-CM | POA: Diagnosis not present

## 2021-11-04 LAB — CBC
HCT: 40.9 % (ref 36.0–46.0)
Hemoglobin: 14 g/dL (ref 12.0–15.0)
MCH: 29.9 pg (ref 26.0–34.0)
MCHC: 34.2 g/dL (ref 30.0–36.0)
MCV: 87.4 fL (ref 80.0–100.0)
Platelets: 178 10*3/uL (ref 150–400)
RBC: 4.68 MIL/uL (ref 3.87–5.11)
RDW: 13.3 % (ref 11.5–15.5)
WBC: 6.6 10*3/uL (ref 4.0–10.5)
nRBC: 0 % (ref 0.0–0.2)

## 2021-11-04 LAB — BASIC METABOLIC PANEL
Anion gap: 9 (ref 5–15)
BUN: 14 mg/dL (ref 8–23)
CO2: 26 mmol/L (ref 22–32)
Calcium: 9.4 mg/dL (ref 8.9–10.3)
Chloride: 103 mmol/L (ref 98–111)
Creatinine, Ser: 0.85 mg/dL (ref 0.44–1.00)
GFR, Estimated: >60 mL/min (ref >60)
Glucose, Bld: 100 mg/dL — ABNORMAL HIGH (ref 70–99)
Potassium: 3.8 mmol/L (ref 3.5–5.1)
Sodium: 138 mmol/L (ref 135–145)

## 2021-11-04 MED ORDER — LACTATED RINGERS IV BOLUS
1000.0000 mL | Freq: Once | INTRAVENOUS | Status: AC
  Administered 2021-11-04: 1000 mL via INTRAVENOUS

## 2021-11-04 NOTE — ED Provider Notes (Signed)
[facility] Provider Note    Event Date/Time   First MD Initiated Contact with Patient 11/04/21 1347     (approximate)   History   Chief Complaint Dizziness and Nausea   HPI  Stephanie Bell is a 81 y.o. female with past medical history of hypertension, CAD, stroke, Parkinson disease, and hiatal hernia status post Nissen fundoplication who presents to the ED complaining of dizziness and nausea.  Patient reports that she was at her hairdresser earlier today when she suddenly began to feel dizzy and lightheaded.  She states that she felt like she was going to pass out and had some nausea with this.  She did not have any vomiting and took a dose of Zofran with subsequent improvement in her nausea.  She denies any associated chest pain or shortness of breath.  She does report that her oral intake has been decreased for the past couple of weeks due to poor appetite.  She denies any fevers, cough, dysuria, hematuria, flank pain, or abdominal pain.     Physical Exam   Triage Vital Signs: ED Triage Vitals  Enc Vitals Group     BP 11/04/21 1328 (!) 84/67     Pulse Rate 11/04/21 1328 80     Resp 11/04/21 1328 18     Temp 11/04/21 1328 98 F (36.7 C)     Temp Source 11/04/21 1328 Oral     SpO2 11/04/21 1328 100 %     Weight 11/04/21 1329 138 lb (62.6 kg)     Height 11/04/21 1329 '5\' 5"'$  (1.651 m)     Head Circumference --      Peak Flow --      Pain Score 11/04/21 1329 0     Pain Loc --      Pain Edu? --      Excl. in [place]? --     Most recent vital signs: Vitals:   11/04/21 1400 11/04/21 1430  BP: 106/78 128/74  Pulse: 81 66  Resp: 17 (!) 9  Temp:    SpO2: 98% 99%    Constitutional: Alert and oriented. Eyes: Conjunctivae are normal. Head: Atraumatic. Nose: No congestion/rhinnorhea. Mouth/Throat: Mucous membranes are moist.  Cardiovascular: Normal rate, regular rhythm. Grossly normal heart sounds.  2+ radial pulses bilaterally. Respiratory: Normal  respiratory effort.  No retractions. Lungs CTAB. Gastrointestinal: Soft and nontender. No distention. Musculoskeletal: No lower extremity tenderness nor edema.  Neurologic:  Normal speech and language. No gross focal neurologic deficits are appreciated.    ED Results / Procedures / Treatments   Labs (all labs ordered are listed, but only abnormal results are displayed) Labs Reviewed  BASIC METABOLIC PANEL - Abnormal; Notable for the following components:      Result Value   Glucose, Bld 100 (*)    All other components within normal limits  CBC  URINALYSIS, ROUTINE W REFLEX MICROSCOPIC     EKG  ED ECG REPORT I, Blake Divine, the attending physician, personally viewed and interpreted this ECG.   Date: 11/04/2021  EKG Time: 13:37  Rate: 79  Rhythm: normal sinus rhythm  Axis: LAD  Intervals:none  ST&T Change: None  PROCEDURES:  Critical Care performed: No  Procedures   MEDICATIONS ORDERED IN ED: Medications  lactated ringers bolus 1,000 mL (1,000 mLs Intravenous New Bag/Given 11/04/21 1423)     IMPRESSION / MDM / ASSESSMENT AND PLAN / ED COURSE  I reviewed the triage vital signs and the nursing notes.  81 y.o. female with past medical history of hypertension, CAD, stroke, Parkinson disease, and hiatal hernia status post Nissen fundoplication who presents to the ED complaining of dizziness and lightheadedness at her hairdresser earlier today.  Patient's presentation is most consistent with acute presentation with potential threat to life or bodily function.  Differential diagnosis includes, but is not limited to, ACS, arrhythmia, dehydration, electrolyte abnormality, AKI, anemia.  Patient nontoxic-appearing and in no acute distress, vital signs are unremarkable and she has no focal neurologic deficits on exam.  With dizziness described as lightheadedness rather than the room spinning around her, I doubt stroke or vertigo as the source  of her symptoms.  She was borderline hypotensive on arrival, however this quickly improved without intervention.  I do suspect there is a component of dehydration given her recent decreased oral intake.  She denies any chest pain or shortness of breath, EKG shows no evidence of arrhythmia or ischemia, and I doubt cardiac etiology for her symptoms.  Labs are reassuring with no significant anemia, leukocytosis, electrolyte abnormality, or AKI.  She denies any symptoms of UTI.  She is feeling much better following IV fluid bolus, is currently asymptomatic and appropriate for discharge home with PCP follow-up.  She was counseled to return to the ED for new or worsening symptoms, patient agrees with plan.      FINAL CLINICAL IMPRESSION(S) / ED DIAGNOSES   Final diagnoses:  Dizziness  Lightheadedness     Rx / DC Orders   ED Discharge Orders     None        Note:  This document was prepared using Dragon voice recognition software and may include unintentional dictation errors.   Blake Divine, MD 11/04/21 [phone]

## 2021-11-04 NOTE — ED Triage Notes (Signed)
Patient brought over in wheelchair by [facility] with husband. State patient was hypotensive. Patient states she woke up feeling nauseated and started feeling dizzy like the room is spinning. Patient states she has parkinsnos and never feels normal but has been having decreased appetite.

## 2021-11-05 ENCOUNTER — Emergency Department: Payer: PPO

## 2021-11-05 ENCOUNTER — Emergency Department
Admission: EM | Admit: 2021-11-05 | Discharge: 2021-11-05 | Disposition: A | Discharge status: Home / Self Care | Payer: PPO | Attending: Emergency Medicine | Admitting: Emergency Medicine

## 2021-11-05 DIAGNOSIS — R42 Dizziness and giddiness: Secondary | ICD-10-CM | POA: Diagnosis not present

## 2021-11-05 DIAGNOSIS — R531 Weakness: Secondary | ICD-10-CM | POA: Diagnosis not present

## 2021-11-05 DIAGNOSIS — G20 Parkinson's disease: Secondary | ICD-10-CM | POA: Insufficient documentation

## 2021-11-05 DIAGNOSIS — Z8673 Personal history of transient ischemic attack (TIA), and cerebral infarction without residual deficits: Secondary | ICD-10-CM | POA: Insufficient documentation

## 2021-11-05 DIAGNOSIS — R8271 Bacteriuria: Secondary | ICD-10-CM | POA: Insufficient documentation

## 2021-11-05 DIAGNOSIS — I2510 Atherosclerotic heart disease of native coronary artery without angina pectoris: Secondary | ICD-10-CM | POA: Insufficient documentation

## 2021-11-05 DIAGNOSIS — I959 Hypotension, unspecified: Secondary | ICD-10-CM | POA: Insufficient documentation

## 2021-11-05 DIAGNOSIS — E860 Dehydration: Secondary | ICD-10-CM | POA: Diagnosis not present

## 2021-11-05 DIAGNOSIS — I10 Essential (primary) hypertension: Secondary | ICD-10-CM | POA: Insufficient documentation

## 2021-11-05 LAB — MAGNESIUM: Magnesium: 2.1 mg/dL (ref 1.7–2.4)

## 2021-11-05 LAB — COMPREHENSIVE METABOLIC PANEL
ALT: 5 U/L (ref 0–44)
AST: 18 U/L (ref 15–41)
Albumin: 3.6 g/dL (ref 3.5–5.0)
Alkaline Phosphatase: 50 U/L (ref 38–126)
Anion gap: 6 (ref 5–15)
BUN: 13 mg/dL (ref 8–23)
CO2: 25 mmol/L (ref 22–32)
Calcium: 9.1 mg/dL (ref 8.9–10.3)
Chloride: 108 mmol/L (ref 98–111)
Creatinine, Ser: 0.82 mg/dL (ref 0.44–1.00)
GFR, Estimated: >60 mL/min (ref >60)
Glucose, Bld: 108 mg/dL — ABNORMAL HIGH (ref 70–99)
Potassium: 3.4 mmol/L — ABNORMAL LOW (ref 3.5–5.1)
Sodium: 139 mmol/L (ref 135–145)
Total Bilirubin: 0.9 mg/dL (ref 0.3–1.2)
Total Protein: 6.1 g/dL — ABNORMAL LOW (ref 6.5–8.1)

## 2021-11-05 LAB — CBC WITH DIFFERENTIAL/PLATELET
Abs Immature Granulocytes: 0.01 10*3/uL (ref 0.00–0.07)
Basophils Absolute: 0 10*3/uL (ref 0.0–0.1)
Basophils Relative: 1 %
Eosinophils Absolute: 0.1 10*3/uL (ref 0.0–0.5)
Eosinophils Relative: 2 %
HCT: 36.9 % (ref 36.0–46.0)
Hemoglobin: 12.3 g/dL (ref 12.0–15.0)
Immature Granulocytes: 0 %
Lymphocytes Relative: 22 %
Lymphs Abs: 1.1 10*3/uL (ref 0.7–4.0)
MCH: 29.1 pg (ref 26.0–34.0)
MCHC: 33.3 g/dL (ref 30.0–36.0)
MCV: 87.2 fL (ref 80.0–100.0)
Monocytes Absolute: 0.5 10*3/uL (ref 0.1–1.0)
Monocytes Relative: 10 %
Neutro Abs: 3.2 10*3/uL (ref 1.7–7.7)
Neutrophils Relative %: 65 %
Platelets: 154 10*3/uL (ref 150–400)
RBC: 4.23 MIL/uL (ref 3.87–5.11)
RDW: 13.2 % (ref 11.5–15.5)
WBC: 4.9 10*3/uL (ref 4.0–10.5)
nRBC: 0 % (ref 0.0–0.2)

## 2021-11-05 LAB — URINALYSIS, ROUTINE W REFLEX MICROSCOPIC
Bilirubin Urine: NEGATIVE
Glucose, UA: NEGATIVE mg/dL
Hgb urine dipstick: NEGATIVE
Ketones, ur: NEGATIVE mg/dL
Nitrite: NEGATIVE
Protein, ur: NEGATIVE mg/dL
Specific Gravity, Urine: 1.012 (ref 1.005–1.030)
pH: 6 (ref 5.0–8.0)

## 2021-11-05 LAB — TROPONIN I (HIGH SENSITIVITY)
Troponin I (High Sensitivity): 4 ng/L (ref <18)
Troponin I (High Sensitivity): 5 ng/L (ref <18)

## 2021-11-05 LAB — TSH: TSH: 0.034 u[IU]/mL — ABNORMAL LOW (ref 0.350–4.500)

## 2021-11-05 MED ORDER — LACTATED RINGERS IV BOLUS
1000.0000 mL | Freq: Once | INTRAVENOUS | Status: AC
  Administered 2021-11-05: 1000 mL via INTRAVENOUS

## 2021-11-05 MED ORDER — ONDANSETRON 4 MG PO TBDP: ORAL_TABLET | ORAL | 0 refills | Status: DC

## 2021-11-05 NOTE — ED Provider Notes (Signed)
[facility] Provider Note    Event Date/Time   First MD Initiated Contact with Patient 11/05/21 1237     (approximate)   History   Chief Complaint Hypotension   HPI  Stephanie Bell is a 81 y.o. female with past medical history of hypertension, CAD, stroke, Parkinson disease, and hiatal hernia status post Nissen fundoplication who presents to the ED complaining of hypotension.  Patient was initially seen in the ED yesterday for dizziness and lightheadedness that came on suddenly while she was at the hairdresser.  Symptoms improved along with her blood pressure following IV fluid bolus and she was subsequently discharged home.  She states that she was feeling well last night and when she first got up this morning, but shortly after eating lunch began to feel dizzy and lightheaded again.  She reports feeling like she was going to pass out, denies any associated chest pain or shortness of breath.  She felt nauseous but did not vomit, states the symptoms improved after she took a dose of Zofran.  She denies any vision changes, speech changes, focal numbness or weakness.  Episode today was very similar to what occurred to her yesterday.  She has not had any fevers, cough, diarrhea, dysuria, hematuria, or flank pain.  She does not take any medication for blood pressure, checked it prior to arrival and found it to be low.     Physical Exam   Triage Vital Signs: ED Triage Vitals  Enc Vitals Group     BP 11/05/21 1232 (!) 86/59     Pulse Rate 11/05/21 1232 89     Resp 11/05/21 1232 18     Temp 11/05/21 1232 97.6 F (36.4 C)     Temp Source 11/05/21 1232 Oral     SpO2 11/05/21 1232 96 %     Weight 11/05/21 1233 138 lb 0.1 oz (62.6 kg)     Height 11/05/21 1233 '5\' 5"'$  (1.651 m)     Head Circumference --      Peak Flow --      Pain Score 11/05/21 1233 0     Pain Loc --      Pain Edu? --      Excl. in [place]? --     Most recent vital signs: Vitals:   11/05/21 1448  11/05/21 1500  BP: 116/68 127/78  Pulse: 61 66  Resp: 20 18  Temp:    SpO2: 92% 100%    Constitutional: Alert and oriented. Eyes: Conjunctivae are normal. Head: Atraumatic. Nose: No congestion/rhinnorhea. Mouth/Throat: Mucous membranes are moist.  Cardiovascular: Normal rate, regular rhythm. Grossly normal heart sounds.  2+ radial pulses bilaterally. Respiratory: Normal respiratory effort.  No retractions. Lungs CTAB. Gastrointestinal: Soft and nontender. No distention. Musculoskeletal: No lower extremity tenderness nor edema.  Neurologic:  Normal speech and language. No gross focal neurologic deficits are appreciated.    ED Results / Procedures / Treatments   Labs (all labs ordered are listed, but only abnormal results are displayed) Labs Reviewed  COMPREHENSIVE METABOLIC PANEL - Abnormal; Notable for the following components:      Result Value   Potassium 3.4 (*)    Glucose, Bld 108 (*)    Total Protein 6.1 (*)    All other components within normal limits  TSH - Abnormal; Notable for the following components:   TSH 0.034 (*)    All other components within normal limits  CBC WITH DIFFERENTIAL/PLATELET  MAGNESIUM  URINALYSIS, ROUTINE W REFLEX  MICROSCOPIC  TROPONIN I (HIGH SENSITIVITY)  TROPONIN I (HIGH SENSITIVITY)     EKG  ED ECG REPORT I, Blake Divine, the attending physician, personally viewed and interpreted this ECG.   Date: 11/05/2021  EKG Time: 12:44  Rate: 96  Rhythm: normal sinus rhythm  Axis: LAD  Intervals:none  ST&T Change: None, artifact noted secondary to tremor  RADIOLOGY Chest x-ray reviewed and interpreted by me with no infiltrate, edema, or effusion.  PROCEDURES:  Critical Care performed: No  Procedures   MEDICATIONS ORDERED IN ED: Medications  lactated ringers bolus 1,000 mL (1,000 mLs Intravenous New Bag/Given 11/05/21 1329)     IMPRESSION / MDM / ASSESSMENT AND PLAN / ED COURSE  I reviewed the triage vital signs and the  nursing notes.                              81 y.o. female with past medical history of hypertension, CAD, stroke, Parkinson disease, and hiatal hernia status post Nissen fundoplication who presents to the ED complaining of dizziness and lightheadedness with low blood pressure at home.  Patient's presentation is most consistent with acute presentation with potential threat to life or bodily function.  Differential diagnosis includes, but is not limited to, ACS, arrhythmia, dehydration, electrolyte abnormality, AKI, anemia, sepsis, UTI, pneumonia.  Patient nontoxic-appearing and in no acute distress, initial blood pressure was found to be low at 86/59, however recheck once she was roomed is now improved without intervention.  She does report recent decreased oral intake due to nausea when eating, which is an acute on chronic issue for her.  She responded well to IV fluids yesterday and we will give additional IV fluid bolus today.  EKG shows no evidence of arrhythmia or ischemia, artifact noted secondary to patient's tremor with her Parkinson disease.  We will repeat CBC and BMP, also add on LFTs, TSH, and troponin.  She denies any symptoms concerning for infection but given recurrent episode of dizziness and low BP, we will check urinalysis and chest x-ray.  Chest x-ray is unremarkable, labs are reassuring with no significant anemia, leukocytosis, electrolyte abnormality, or AKI.  No events noted on cardiac monitor and troponin within normal limits, low suspicion for cardiac etiology for her symptoms.  Thyroid function also appears unremarkable, blood pressure remains stable here in the ED.  We are currently awaiting urinalysis results, but if these are unremarkable and blood pressure remained stable, patient would be appropriate for discharge home with PCP follow-up.  Patient turned over to oncoming provider pending urinalysis results and reassessment.      FINAL CLINICAL IMPRESSION(S) / ED  DIAGNOSES   Final diagnoses:  Lightheadedness  Hypotension, unspecified hypotension type     Rx / DC Orders   ED Discharge Orders          Ordered    ondansetron (ZOFRAN ODT) 4 MG disintegrating tablet        11/05/21 1555             Note:  This document was prepared using Dragon voice recognition software and may include unintentional dictation errors.   Blake Divine, MD 11/05/21 1556

## 2021-11-05 NOTE — ED Notes (Signed)
Assisted pt to toilet 

## 2021-11-05 NOTE — ED Provider Notes (Signed)
Patient received in signout from Dr. Charna Archer pending follow-up labs and reassessment.  Patient states that she feels significantly improved "I feel fantastic.  ".  Denies any pain.  Urinalysis with few bacteria denies any dysuria no increased frequency no flank pain no fevers.  She just completed a round of antibiotics.  Do not feel this reflects UTI.  Patient requesting discharge home she is able to ambulate steady gait and feels at her baseline.  Do believe she stable and appropriate for outpatient follow-up.   Merlyn Lot, MD 11/05/21 2004

## 2021-11-05 NOTE — ED Triage Notes (Signed)
Pt states that her BP dropped after she ate breakfast this AM- pt was here yesterday for same and was seen by Dr Charna Archer- at home her BP was 80s/40s

## 2021-11-06 ENCOUNTER — Telehealth: Payer: Self-pay | Admitting: Emergency Medicine

## 2021-11-06 MED ORDER — ONDANSETRON 4 MG PO TBDP: 4.0000 mg | ORAL_TABLET | Freq: Three times a day (TID) | ORAL | 0 refills | Status: DC | PRN

## 2021-11-06 NOTE — Telephone Encounter (Signed)
-----------------------------------------   1:26 PM on 11/06/21 ----------------------------------------- Patient called the ED stating that pharmacy her prescription for Zofran was sent to does not accept her insurance.  [facility]  Prescription for Zofran was recent per patient request.

## 2021-11-07 ENCOUNTER — Telehealth: Payer: Self-pay | Admitting: Nurse Practitioner

## 2021-11-07 LAB — URINE CULTURE

## 2021-11-07 NOTE — Telephone Encounter (Signed)
Stephanie Bell, patient has not been seen in the office for 2+ years.  She needs to contact her PCP who prescribed the Carafate.  Her nausea and dizziness may be an adverse effect from Carafate.  I would recommend for her to stop the Carafate but she needs to discuss this further with her PCP.  Please schedule her for a follow-up appointment in our office regarding GI symptoms.  She can take MiraLAX nightly for constipation.

## 2021-11-07 NOTE — Telephone Encounter (Signed)
Pt was made aware of Carl Best NP recommendations: Pt has been previously  scheduled for an office visit with Carl Best NP 9/14/ 2023 at 10:00: Pt made aware:  Pt verbalized understanding with all questions answered.

## 2021-11-07 NOTE — Telephone Encounter (Signed)
Pt stated that she has went to the ER on Friday and Saturday:  Chart reviewed. Pt received Fluids for dehydration and Zofran: Pt stated that she was  prescribed sucralfate last Wednesday  by her PCP: Pt stated that since then she has been very nauseated, poor appetite, and having dizziness:   Chart reviewed: Pt check BP while on phone and pt BP was 124/84: Pt was unsure of last BM and stated that she thinks it was anywhere from 4 days to a week: Pt has no abdominal pain:  Please review and advise

## 2021-11-07 NOTE — Telephone Encounter (Signed)
Patient went to the ER both Friday and Saturday this past weekend.  She cannot eat, is dizzy and nauseated, and her BP was 80/40.  She has an appointment scheduled for 9/14; however, she feels like she needs to be seen sooner rather than later.  Please call patient and advise.  Thank you.

## 2021-11-08 ENCOUNTER — Telehealth: Payer: Self-pay

## 2021-11-08 NOTE — Telephone Encounter (Signed)
        Patient  visited [place] on 11/05/2021  for dizziness   Telephone encounter attempt :  1st  A HIPAA compliant voice message was left requesting a return call.  Instructed patient to call back at  earliest convenience. [facility]  [phone] [address], [place], [place] [address] Phone: [phone] Email: [email or web address]

## 2021-11-09 ENCOUNTER — Encounter: Payer: Self-pay | Admitting: Adult Health

## 2021-11-09 ENCOUNTER — Ambulatory Visit: Payer: PPO | Admitting: Adult Health

## 2021-11-09 VITALS — BP 122/79 | HR 82 | Ht 65.0 in | Wt 138.8 lb

## 2021-11-09 DIAGNOSIS — G20 Parkinson's disease: Secondary | ICD-10-CM | POA: Diagnosis not present

## 2021-11-09 NOTE — Patient Instructions (Signed)
Your Plan:  Take Sinemet 1 tablet AM and 1/2 tablet midday and 1 tablet at bedtime If your symptoms worsen or you develop new symptoms please let us know.   Thank you for coming to see [place] at [facility]. I hope we have been able to provide you high quality care today.  You may receive a patient satisfaction survey over the next few weeks. We would appreciate your feedback and comments so that we may continue to improve ourselves and the health of our patients.

## 2021-11-09 NOTE — Progress Notes (Signed)
PATIENT: Stephanie Bell DOB: May 19, 1940  REASON FOR VISIT: follow up HISTORY FROM: patient Chief Complaint  Patient presents with   Rm 19    Pt in 19  pt states two weeks ago had UTI and she was on Nitrofurantoin and amoxicillin ,and sucralfate Pt states her BP dropped 80/40 ,nauseated,not eating ,dizziness,and lightheaded Pt stopped taking medications       HISTORY OF PRESENT ILLNESS: Today 11/09/21:  Stephanie Bell is an 81 year old female with a history of Parkinson's disease.  She returns today for follow-up.  She is here today with her husband.  She reports that she has had UTI frequently over the last 30 years. Reports that whichever ABX she was using the formulary was changed. Her urologist has been working to find another ABX. Had UTI two weeks again was placed on nitrofurantoin for 7 days. Only took 5 days because she felt nauseous, not eating. She was then prescribed amoxicillin and took it for several days but began having same symptoms. She was then was given sucralfate but this dropped her BP (80/40) and ended in the ER on Friday. Went back on Saturday and got IV fluids. Reports that she is starting to feel better now. She is now only taking her Sinemet. only taking 1 tablet BID wasn't aware she could take 1/2 tab during the day. Appetite is still decreased but making herself eat. Reports that she is drinking fluids. Able to complete ADLs with some assistance. Sleeping ok.   02/17/21:Stephanie Bell is an 81 year old female with a history of idiopathic Parkinson's disease.  She returns today for follow-up.  She states back in September she did have a fall and broke her wrist.  She had the cast removed today.  She states that she typically takes Sinemet 1 tablet in the morning, half a tablet at lunch and 1 tablet in the evening.  Although she does state that sometimes she does not take the evening dose.  She denies any significant changes in her gait or balance.  She does use a cane when  ambulating.  Reports mild tremor in the left hand.  Denies any trouble swallowing or chewing food.  Reports that sometimes she has a hard time sleeping but she is able to nap during the day if needed.  She denies any new symptoms.  Returns today for an evaluation.  HISTORY (copied from Dr. Cathren Laine note) 08/11/2020; DAT Scan was consistent with parkinson's disease. We discussed today. She went to [facility] and diagnosed with likely idiopathic parkinson's disease. She declines PT, I advised her to stay active. Given her orthostatic hypotension, increasing sinemet may pose a problem, Since a dosage increase in the future is most likely inevitable, Sinemet may create challenges per [facility]. Since she was unable to tolerate compression stockings, counseling regarding adequate hydration, abdominal binder, medications such as midodrine or fludricortisone may all be needed and we discussed that today during the appointment. No swallowing difficulties, no falls at home, discussed fall risks. She feels stable. Tremors are not worsening. Not dropping things. We will continue to follow her and if needed in the future she can go back to [facility] if treatment presents a problem for [place] and her.    DAT Scan 06/03/20: reviewed images Asymmetric decreased striatal Ioflupane activity as above. Loss of activity is greater on the RIGHT than the LEFT. This pattern can be seen in Parkinsonian syndromes   04/08/2020: Here for follow up. Today we discussed her symptoms and  reviewed her past, she doesn't feel she has more memory problems than an 79 year should, she has to write down appointments and review schedule daily. We discussed driving and her reflexes may be reduced, she only goes to the beauty shop and places that are close by. She has excellent vision. Her night vision is not as good, I encouraged her not to drive at night and stay just a few miles from home to familiar locations. The Sinemet is helping. We had  suggested a DAT scan in the past when her symptoms were milder, today she brings up the DAT Scan and wants to make sure she has PD and not essential tremor and we can order a DAT Scan. No falls. No postural symptoms. She uses a walker at home. She uses a cane outside the home and this works fine. She has a slight resting tremor ont he left, she states it is only because she is under stress. They declined PT at home for exercise. Not hypophonic. She was having severe orthostatic hypotension and now she can take the Sinemet without a problem. She talks in her sleep and can talk and hold a conversation in her sleep. Not acting out dreams. She has decreased smell and taste. No problems swallowing. No dyskinesias.    10/02/2019: Patient is here with her husband, this is the first time I have met her husband, we revieiwed patient's workup, history, I answered any questions and we discussed the diagnosis of parkinson's disease and I provided much information for patient and husband, we discussed exercise and reviewed lots of exercise options for PD and how important that is, patient states Feels like the Sinemet is really helping with tremor, no tremors since taking it, she feels her walking is k, she still uses a cane. She went to a urologist a few months ago and she was having constipation problems, she was given miralax for 3 days and then a dulcolax. She is taking 1/2 the dose of miralax and since then she has been fine without miralax. She had a hiatal hernia and she had surgery and did great but coughed and it came loose and then she had the surgery again, she had an episode last May and she had a dry tablet and was eating breakfast and it was coming back up but this is not new, she threw up and no more incidents since then she tries to make sure she doesn't eat anything dry, she hashad to learn but this week she had some sticking in her throat with a dry tablet again and then she had some sticking and she spit up a  little bit, she has slowed down and it is better and pill crusher helps. She had a swallow test earlier this year, and she has had a swallow test and follows with her GI doctor regularly and is monitoring, no falls, husband says he doesn't let her carry the groceries and he laughs and says he doesn't make her mow the lawn either and laughs. Husband has placed some bars around the house for safety and she now has a vaccuuming robot. She will use a cane or a rolling walker.  She does not have sleep apnea, she does not snore, husband doesn't seem to feel she is very active in her sleep but she does have vivid dreams and had them for years but appears to be better the last several years. She is on daily medication now for UTIs for 6 months Trimethorprin and  following for that with urology. No dyskinesias.    Interval history June 30, 2019: Patient was seen in the past for thalamic stroke and tremor, we did note she had some mild parkinsonian symptoms at last appointment in 2019(she never followed up with [place]), we discussed further testing and a DaTscan at that time she agreed to the [place] but then changed her mind and declined, since I last saw her in 2019 she has been diagnosed with Parkinson's disease and she has already seen a neurologist.  She saw Dr. 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  MRI of the brain was ordered as well as vitamin B12 and vitamin D, she was started on carbidopa levodopa 3 times a day, MiraLAX Dulcolax and enema for constipation, she was talked to at length about her UTI, patient has a long history of UTIs.   Patient is here alone, As mentioned above, we saw her 2 years ago and recommend DatScan at that time which she declined but we  suspected early parkinson's disease. In the 2 years(she never followed up) her symptoms have progressed and she is on sinemet currently see by another neurologist. She has left resting tremor, worsening, she had one fall but she denies falling more often, she is walking slowly, she reports vivid dreams. She doesn't feel her memory is as good (she doesn't remember meeting me in the past or [place] talking about her parkinson's symptoms). She has a son who is POA he lives in [place], other son lives in [place], daughter has done a lot of research on parkinson's disease she she has lots of support.    Patient complains of symptoms per HPI as well as the following symptoms: tremors . Pertinent negatives and positives per HPI. All others negative     Interval history 04/30/2017: Patient is here for follow-up of tremor.  Her last appointment we discussed that she had some mild parkinsonian symptoms.  Discussed the differential which includes Parkinson's disease.  The options were to start medication, follow clinically, or perform a DaTscan.  At that time she wanted to perform the DaTscan but she changed her mind.  Today we discussed parkinsonism in all the different reasons.  She continues to have tremors, resting and action.  Discussed Parkinson's disease, also essential tremor.  Discussed medication management and Parkinson's disease including dopamine, dopamine agonists and others.  At this time she decided that she like to follow clinically which I agree with.  We will see her back in 6 months.  Otherwise she is doing excellent, she is playing tennis.    Interval history 12/25/2016; She has noticed tremor in her left hand noticed it first in may when driving and more recently when holding something heavy, she notices it at rest as well. She has some stress in her life. Happens every day, No caffeine. She is on aspirin. No tremor in the family. No FHx of Parkinson's. No slowing, no shuffling, no falls so far. No  hallucinations or delusions. She reports poor memory. She uses a walker at home, she says she has slowed down.    HPI:  Stephanie Bell is a lovely 81 y.o. female here as a referral from Dr. Kary Kos for thalamic stroke. She has a past medical history of hypertension, hyperlipidemia, no diabetes. She presented  to the hospital on February 24 after waking up with right foot and leg numbness. She also reported weakness. No other neurologic deficits. No aphasia, no dysphagia, no dysarthria, no other weakness or numbness, no altered mentation. Her husband gave her to aspirin may went to the emergency room. She had an MRI of the brain which was positive for stroke.  Sheis feeling much better. Hisband here and provides information as well. No more numbness and no more weakness. She feels scared that she had a stroke, doesn't understand why or where in the brain she had it. Never had a stroke before. There were no inciting events, she just woke up. No FHx of stroke.   Reviewed notes, labs and imaging from outside physicians, which showed:   ldl 138. [id number] wnl   Personally reviewed images with patient and her husband and agree with the following:   MRI HEAD FINDINGS   Calvarium and upper cervical spine: No focal marrow signal abnormality.   Orbits: Negative.   Sinuses and Mastoids: Mucosal thickening on the left, overall mild. No effusion.   Brain: Ovoid 1 cm area of restricted diffusion in the lateral left thalamus, extending into the posterior limb internal capsule. No hemorrhagic conversion. No other territory of acute infarct. No evidence of major vessel occlusion.   Elsewhere, microvascular ischemic change is mild/expected for age. No previous infarct. No chronic hemorrhagic foci.   MRA HEAD FINDINGS   Symmetric carotid and vertebral arteries. Intact circle Willis with small anterior communicating artery.   Symmetric vertebrobasilar branching. Symmetric poor signal in distal vertebral and  proximal basilar arteries is considered artifactual. These vessels have normal signal on T2 weighted conventional imaging.   Atherosclerotic type narrowings:   Distal left M1 segment high-grade tandem stenoses.   Moderate right proximal M2 segment stenosis.   Bilateral M3 narrowing, potentially overestimated due to artifact (poor signal in the bilateral A2 segments appears artifactual).   Bilateral atherosclerotic irregularity of the proximal PCA.     IMPRESSION: 1. 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 2. No acute arterial finding. 3. Intracranial atherosclerosis as described. No asymmetric disease in the proximal left PCA to correlate with #1.   Ultrasound carotids with no hemodynamically significant stenosis.    REVIEW OF SYSTEMS: Out of a complete 14 system review of symptoms, the patient complains only of the following symptoms, and all other reviewed systems are negative.  See HPI  ALLERGIES: Allergies  Allergen Reactions   Benzocaine Palpitations    Specific agent was Scandonest 2 %   Epinephrine Hcl (Nasal) Palpitations and Other (See Comments)    Reaction:  Tachycardia    Other Other (See Comments) and Palpitations    EPINEPHRINE Reaction:  Tachycardia    Cefuroxime Axetil Nausea Only   Omeprazole Diarrhea   Sulfa Antibiotics Swelling and Rash    HOME MEDICATIONS: Outpatient Medications Prior to Visit  Medication Sig Dispense Refill   aspirin EC 81 MG tablet Take 81 mg by mouth daily.     carbidopa-levodopa (SINEMET IR) 25-100 MG tablet 1 TABLET IN THE MORNING, MAY ALSO TAKE 1/2 TO 1 TABLET AT MIDDAY AND IN THE EVENING 270 tablet 1   Cholecalciferol (VITAMIN D3) 5000 UNITS CAPS Take 5,000 Units by mouth once a week.      conjugated estrogens (PREMARIN) vaginal cream Apply one pea-sized amount around the opening of the urethra daily for 2 weeks, then 3 times weekly moving forward. 30 g 4   Cranberry-Vitamin C 84-20 MG CAPS  Take 2 capsules  by mouth daily.     Cyanocobalamin (VITAMIN B-12 PO) Take 1,000 mcg by mouth daily.      methylphenidate (RITALIN) 10 MG tablet Take 10 mg by mouth daily as needed.     MYRBETRIQ 25 MG TB24 tablet TAKE 1 TABLET (25 MG TOTAL) BY MOUTH DAILY. 30 tablet 0   ondansetron (ZOFRAN-ODT) 4 MG disintegrating tablet Take 1 tablet (4 mg total) by mouth every 8 (eight) hours as needed for nausea or vomiting. 30 tablet 0   sertraline (ZOLOFT) 25 MG tablet Take 25 mg by mouth daily.     No facility-administered medications prior to visit.    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    PAST SURGICAL HISTORY: Past Surgical History:  Procedure Laterality Date   BACK SURGERY  05-09-11   x 2 lumbar   CHOLECYSTECTOMY  05-09-11   laparoscopic   COLONOSCOPY  2002, 2008    rectal hyperplastic polyp 2002, diverticulosis 2008   ESOPHAGOGASTRODUODENOSCOPY     HERNIA REPAIR  05/16/2011   hiatal hernia   HIATAL HERNIA REPAIR  02/09/2012   Procedure: LAPAROSCOPIC REPAIR OF HIATAL HERNIA;  Surgeon: Pedro Earls, MD;  Location: [facility];  Service: General;  Laterality: N/A;   INSERTION OF MESH  02/09/2012   Procedure: INSERTION OF MESH;  Surgeon: Pedro Earls, MD;  Location: [facility];  Service: General;;   IRRIGATION AND DEBRIDEMENT SEBACEOUS CYST  05/16/2011   Procedure: IRRIGATION AND DEBRIDEMENT SEBACEOUS CYST;  Surgeon: Pedro Earls, MD;  Location: [facility];  Service: General;   Laterality: N/A;  middle of chest on sternum   LAPAROSCOPIC NISSEN FUNDOPLICATION  7/65/4650   Procedure: LAPAROSCOPIC NISSEN FUNDOPLICATION;  Surgeon: Pedro Earls, MD;  Location: [facility];  Service: General;  Laterality: N/A;  will also remove sebaceous cyst of chest wall   LAPAROSCOPIC NISSEN FUNDOPLICATION  35/46/5681   Procedure: LAPAROSCOPIC NISSEN FUNDOPLICATION;  Surgeon: Pedro Earls, MD;  Location: [facility];  Service: General;  Laterality: N/A;  Re-do Laparoscopic Nissen    SEPTOPLASTY     TONSILLECTOMY  05-09-11   Tonsillectomy   UMBILICAL HERNIA REPAIR  05/16/2011   Procedure: HERNIA REPAIR UMBILICAL ADULT;  Surgeon: Pedro Earls, MD;  Location: [facility];  Service: General;  Laterality: N/A;  with mesh    UPPER GASTROINTESTINAL ENDOSCOPY     VAGINAL HYSTERECTOMY      FAMILY HISTORY: Family History  Problem Relation Age of Onset   Alzheimer's disease Mother    Stroke Mother    Diabetes Mother    Bipolar disorder Mother    Ulcers Father    Alcohol abuse Father    Ulcers Paternal Grandmother    Colon cancer Neg Hx    Rectal cancer Neg Hx    Stomach cancer Neg Hx    Esophageal cancer Neg Hx  Parkinson's disease Neg Hx     SOCIAL HISTORY: Social History   Socioeconomic History   Marital status: Married    Spouse name: [name]   Number of children: 3   Years of education: College   Highest education level: Not on file  Occupational History   Not on file  Tobacco Use   Smoking status: Never   Smokeless tobacco: Never  Vaping Use   Vaping Use: Never used  Substance and Sexual Activity   Alcohol use: Yes    Alcohol/week: 4.0 standard drinks of alcohol    Types: 4 Glasses of wine per week   Drug use: No   Sexual activity: Yes    Birth control/protection: Post-menopausal  Other Topics Concern   Not on file  Social History Narrative   Lives with husband.   Caffeine use: occasional coke   Right handed   Enjoys tennis   3 children    no tobacco or drug  use   Occasional glass of wine at night    Social Determinants of Health   Financial Resource Strain: Not on file  Food Insecurity: Not on file  Transportation Needs: Not on file  Physical Activity: Not on file  Stress: Not on file  Social Connections: Not on file  Intimate Partner Violence: Not on file      PHYSICAL EXAM  Vitals:   11/09/21 1309  BP: 122/79  Pulse: 82  Weight: 138 lb 12.8 oz (63 kg)  Height: _0  (1.651 m)   Body mass index is 23.1 kg/m.  Generalized: Well developed, in no acute distress   Neurological examination  Mentation: Alert oriented to time, place, history taking. Follows all commands speech and language fluent Cranial nerve II-XII: Pupils were equal round reactive to light. Extraocular movements were full, visual field were full on confrontational test. Facial sensation and strength were normal.  Head turning and shoulder shrug  were normal and symmetric.  Motor: The motor testing reveals 5 over 5 strength of all 4 extremities. Good symmetric motor tone is noted throughout.  Moderate impairment finger taps and toe taps bilaterally.  Resting tremor noted in the upper extremities Sensory: Sensory testing is intact to soft touch on all 4 extremities. No evidence of extinction is noted.  Coordination: Cerebellar testing reveals good finger-nose-finger and heel-to-shin bilaterally.  Gait and station:  slightly stooped posture good stride uses a rolling when ambulating.  Marland Kitchen   DIAGNOSTIC DATA (LABS, IMAGING, TESTING) - I reviewed patient records, labs, notes, testing and imaging myself where available.  Lab Results  Component Value Date   WBC 4.9 11/05/2021   HGB 12.3 11/05/2021   HCT 36.9 11/05/2021   MCV 87.2 11/05/2021   PLT 154 11/05/2021      Component Value Date/Time   NA 139 11/05/2021 1321   NA 141 12/25/2016 1153   NA 141 06/05/2011 0251   K 3.4 (L) 11/05/2021 1321   K 3.6 06/05/2011 0251   CL 108 11/05/2021 1321   CL 106  06/05/2011 0251   CO2 25 11/05/2021 1321   CO2 22 06/05/2011 0251   GLUCOSE 108 (H) 11/05/2021 1321   GLUCOSE 107 (H) 06/05/2011 0251   BUN 13 11/05/2021 1321   BUN 7 (L) 12/25/2016 1153   BUN 8 06/05/2011 0251   CREATININE 0.82 11/05/2021 1321   CREATININE 0.63 06/05/2011 0251   CALCIUM 9.1 11/05/2021 1321   CALCIUM 9.0 06/05/2011 0251   PROT 6.1 (L) 11/05/2021 1321   PROT 6.2  12/25/2016 1153   PROT 6.7 06/05/2011 0251   ALBUMIN 3.6 11/05/2021 1321   ALBUMIN 4.1 12/25/2016 1153   ALBUMIN 3.6 06/05/2011 0251   AST 18 11/05/2021 1321   AST 30 06/05/2011 0251   ALT 5 11/05/2021 1321   ALT 20 06/05/2011 0251   ALKPHOS 50 11/05/2021 1321   ALKPHOS 66 06/05/2011 0251   BILITOT 0.9 11/05/2021 1321   BILITOT 0.8 12/25/2016 1153   BILITOT 0.7 06/05/2011 0251   GFRNONAA >60 11/05/2021 1321   GFRNONAA >60 06/05/2011 0251   GFRAA >60 02/10/2019 1542   GFRAA >60 06/05/2011 0251   Lab Results  Component Value Date   CHOL 199 03/05/2017   HDL 49 03/05/2017   LDLCALC 127 (H) 03/05/2017   TRIG 117 03/05/2017   CHOLHDL 4.1 03/05/2017   Lab Results  Component Value Date   HGBA1C 5.5 03/05/2017   No results found for: "VITAMINB12" Lab Results  Component Value Date   TSH 0.034 (L) 11/05/2021      ASSESSMENT AND PLAN 81 y.o. 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 here with :  1.  Parkinson's disease  Advised to take Sinemet 25-100 mg 1 tablet in the morning, half a tablet at noon and 1 tablet at bedtime Stay  well-hydrated Advised if symptoms worsen or she develops new symptoms she should let us know Follow-up in 6 months or sooner if needed     Ward Givens, MSN, NP-C 11/09/2021, 1:28 PM [facility] [address], [place], [place] [address] [phone]

## 2021-11-22 ENCOUNTER — Ambulatory Visit: Payer: PPO | Admitting: Physician Assistant

## 2021-11-22 DIAGNOSIS — Z87440 Personal history of urinary (tract) infections: Secondary | ICD-10-CM

## 2021-11-22 DIAGNOSIS — N390 Urinary tract infection, site not specified: Secondary | ICD-10-CM | POA: Diagnosis not present

## 2021-11-22 DIAGNOSIS — R3915 Urgency of urination: Secondary | ICD-10-CM | POA: Diagnosis not present

## 2021-11-22 LAB — MICROSCOPIC EXAMINATION

## 2021-11-22 LAB — URINALYSIS, COMPLETE
Bilirubin, UA: NEGATIVE
Glucose, UA: NEGATIVE
Ketones, UA: NEGATIVE
Leukocytes,UA: NEGATIVE
Nitrite, UA: NEGATIVE
Protein,UA: NEGATIVE
RBC, UA: NEGATIVE
Specific Gravity, UA: 1.01 (ref 1.005–1.030)
Urobilinogen, Ur: 2 mg/dL — ABNORMAL HIGH (ref 0.2–1.0)
pH, UA: 6 (ref 5.0–7.5)

## 2021-11-22 MED ORDER — MIRABEGRON ER 25 MG PO TB24: 25.0000 mg | ORAL_TABLET | Freq: Every day | ORAL | 11 refills | Status: DC

## 2021-11-22 NOTE — Progress Notes (Signed)
In and Out Catheterization  Patient is present today for a I & O catheterization due to UTI. Patient was cleaned and prepped in a sterile fashion with betadine . A 16FR cath was inserted no complications were noted , 73m of urine return was noted, urine was yellow in color. A clean urine sample was collected for UA. Bladder was drained  And catheter was removed with out difficulty.    Performed by: CElberta Leatherwood [place]

## 2021-11-24 NOTE — Progress Notes (Signed)
11/22/2021 8:44 AM   Nino Parsley Antony Contras 07/30/1940 [id number]  CC: Chief Complaint  Patient presents with   Recurrent UTI   HPI: Stephanie Bell is a 81 y.o. female with PMH recurrent UTI previously on trimethoprim 100 mg daily, urinary urgency on Myrbetriq 25 mg, and Parkinson's disease who presents today for evaluation of possible UTI.   I saw her in clinic most recently on 10/13/2021 for the same.  I treated her with empiric Macrobid x5 days and her urine culture finalized with E. coli and Klebsiella oxytoca.  She called back to clinic 7 days later reporting return of her dysuria and shooting pains in her arms.  She also stated she had diarrhea on Macrobid.  I called in Augmentin x7 days for persistent versus recurrent UTI.  Today she reports her symptoms resolved, however she has had nausea this morning and is concerned that this may represent UTI.  Nausea is rather common for her and she has a prescription for Zofran at home.  She states that she wanted to be seen and evaluated for UTI because she is concerned that we are "headed into the weekend."  Notably, today is Tuesday.  She brings with her a list of numerous medications that she initially reports she has allergies to, but then she became rather confused about whether or not she truly has reactions to all the medications on the list.  Regardless, her reported reactions typically revolve around GI upset.  She also requests a refill of Myrbetriq today.  Myrbetriq was on her list of potential allergies as above, however when shown this she insist that she does not react to the Myrbetriq and tolerates it well.  In-office catheterized UA today positive for 2.0 EU/DL urobilinogen; urine microscopy pan negative.  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   GERD (gastroesophageal reflux disease)    H/O hiatal hernia    Hx: UTI (urinary tract infection)     Interstitial cystitis    Kidney stone    Lumbago    Palpitations 02/2017   Paraesophageal hernia    Parkinson disease ([place])    Parkinson's disease (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    Surgical History: Past Surgical History:  Procedure Laterality Date   BACK SURGERY  05-09-11   x 2 lumbar   CHOLECYSTECTOMY  05-09-11   laparoscopic   COLONOSCOPY  2002, 2008    rectal hyperplastic polyp 2002, diverticulosis 2008   ESOPHAGOGASTRODUODENOSCOPY     HERNIA REPAIR  05/16/2011   hiatal hernia   HIATAL HERNIA REPAIR  02/09/2012   Procedure: LAPAROSCOPIC REPAIR OF HIATAL HERNIA;  Surgeon: Pedro Earls, MD;  Location: [facility];  Service: General;  Laterality: N/A;   INSERTION OF MESH  02/09/2012   Procedure: INSERTION OF MESH;  Surgeon: Pedro Earls, MD;  [facility]: [facility];  Service: General;;   IRRIGATION AND DEBRIDEMENT SEBACEOUS CYST  05/16/2011   Procedure: IRRIGATION AND DEBRIDEMENT SEBACEOUS CYST;  Surgeon: Pedro Earls, MD;  [facility];  Service: General;  Laterality: N/A;  middle of chest on sternum   LAPAROSCOPIC NISSEN FUNDOPLICATION  1/60/7371   Procedure: LAPAROSCOPIC NISSEN FUNDOPLICATION;  Surgeon: Pedro Earls, MD;  [facility];  Service: General;  Laterality: N/A;  will also remove sebaceous cyst of chest wall   LAPAROSCOPIC NISSEN FUNDOPLICATION  59/56/3875   Procedure: LAPAROSCOPIC NISSEN FUNDOPLICATION;  Surgeon: Pedro Earls, MD;  [facility];  Service: General;  Laterality: N/A;  Re-do Laparoscopic Nissen    SEPTOPLASTY     TONSILLECTOMY  05-09-11   Tonsillectomy   UMBILICAL HERNIA REPAIR  05/16/2011   Procedure: HERNIA REPAIR UMBILICAL ADULT;  Surgeon: Pedro Earls, MD;  [facility];  Service: General;  Laterality: N/A;  with mesh    UPPER GASTROINTESTINAL ENDOSCOPY     VAGINAL HYSTERECTOMY       Home Medications:  Allergies as of 11/22/2021       Reactions   Benzocaine Palpitations   Specific agent was Scandonest 2 %   Epinephrine Hcl (nasal) Palpitations, Other (See Comments)   Reaction:  Tachycardia    Other Other (See Comments), Palpitations   EPINEPHRINE Reaction:  Tachycardia    Amoxicillin-pot Clavulanate Diarrhea, Nausea And Vomiting   Cefuroxime Axetil Nausea Only   Nitrofurantoin Nausea And Vomiting   Omeprazole Diarrhea   Sucralfate Nausea Only   Trimethoprim Diarrhea, Nausea And Vomiting   Sulfa Antibiotics Swelling, Rash        Medication List        Accurate as of November 22, 2021 11:59 PM. If you have any questions, ask your nurse or doctor.          aspirin EC 81 MG tablet Take 81 mg by mouth daily.   carbidopa-levodopa 25-100 MG tablet Commonly known as: SINEMET IR 1 TABLET IN THE MORNING, MAY ALSO TAKE 1/2 TO 1 TABLET AT MIDDAY AND IN THE EVENING   Cranberry-Vitamin C 84-20 MG Caps Take 2 capsules by mouth daily.   methylphenidate 10 MG tablet Commonly known as: RITALIN Take 10 mg by mouth daily as needed.   mirabegron ER 25 MG Tb24 tablet Commonly known as: Myrbetriq Take 1 tablet (25 mg total) by mouth daily. What changed: how much to take   ondansetron 4 MG disintegrating tablet Commonly known as: ZOFRAN-ODT Take 1 tablet (4 mg total) by mouth every 8 (eight) hours as needed for nausea or vomiting.   Premarin vaginal cream Generic drug: conjugated estrogens Apply one pea-sized amount around the opening of the urethra daily for 2 weeks, then 3 times weekly moving forward.   sertraline 25 MG tablet Commonly known as: ZOLOFT Take 25 mg by mouth daily.   VITAMIN B-12 PO Take 1,000 mcg by mouth daily.   Vitamin D3 125 MCG (5000 UT) Caps Take 5,000 Units by mouth once a week.        Allergies:  Allergies  Allergen Reactions   Benzocaine Palpitations    Specific agent was Scandonest 2 %   Epinephrine Hcl (Nasal)  Palpitations and Other (See Comments)    Reaction:  Tachycardia    Other Other (See Comments) and Palpitations    EPINEPHRINE Reaction:  Tachycardia    Amoxicillin-Pot Clavulanate Diarrhea and Nausea And Vomiting   Cefuroxime Axetil Nausea Only   Nitrofurantoin Nausea And Vomiting   Omeprazole Diarrhea   Sucralfate Nausea Only   Trimethoprim Diarrhea and Nausea And Vomiting   Sulfa Antibiotics Swelling and Rash    Family History: Family History  Problem Relation Age of Onset   Alzheimer's disease Mother    Stroke Mother    Diabetes Mother    Bipolar disorder Mother    Ulcers Father    Alcohol abuse Father  Ulcers Paternal Grandmother    Colon cancer Neg Hx    Rectal cancer Neg Hx    Stomach cancer Neg Hx    Esophageal cancer Neg Hx    Parkinson's disease Neg Hx     Social History:   reports that she has never smoked. She has never used smokeless tobacco. She reports current alcohol use of about 4.0 standard drinks of alcohol per week. She reports that she does not use drugs.  Physical Exam: There were no vitals taken for this visit.  Constitutional:  Alert, no acute distress, nontoxic appearing HEENT: [place], AT Cardiovascular: No clubbing, cyanosis, or edema Respiratory: Normal respiratory effort, no increased work of breathing Skin: No rashes, bruises or suspicious lesions Neurologic: Grossly intact, no focal deficits, moving all 4 extremities Psychiatric: Normal mood and affect  Laboratory Data: Results for orders placed or performed in visit on 11/22/21  Microscopic Examination   Urine  Result Value Ref Range   WBC, UA 0-5 0 - 5 /hpf   RBC, Urine 0-2 0 - 2 /hpf   Epithelial Cells (non renal) 0-10 0 - 10 /hpf   Mucus, UA Present (A) Not Estab.   Bacteria, UA Few None seen/Few  Urinalysis, Complete  Result Value Ref Range   Specific Gravity, UA 1.010 1.005 - 1.030   pH, UA 6.0 5.0 - 7.5   Color, UA Yellow Yellow   Appearance Ur Clear Clear   Leukocytes,UA  Negative Negative   Protein,UA Negative Negative/Trace   Glucose, UA Negative Negative   Ketones, UA Negative Negative   RBC, UA Negative Negative   Bilirubin, UA Negative Negative   Urobilinogen, Ur 2.0 (H) 0.2 - 1.0 mg/dL   Nitrite, UA Negative Negative   Microscopic Examination See below:    Assessment & Plan:   1. History of recurrent UTI (urinary tract infection) UA today is bland.  History is rather challenging and she seems a bit confused in clinic today.  No antibiotics indicated. - Urinalysis, Complete  2. Urinary urgency We will refill Myrbetriq.  I do not think she has an allergy to this medication as she has been on it for quite some time and has not previously reported any issues with it. - mirabegron ER (MYRBETRIQ) 25 MG TB24 tablet; Take 1 tablet (25 mg total) by mouth daily.  Dispense: 30 tablet; Refill: 11  Return if symptoms worsen or fail to improve.  Debroah Loop, PA-C  [facility] [address], [place], [place] [address] [phone]

## 2021-12-01 ENCOUNTER — Ambulatory Visit (INDEPENDENT_AMBULATORY_CARE_PROVIDER_SITE_OTHER): Payer: PPO | Admitting: Nurse Practitioner

## 2021-12-01 ENCOUNTER — Encounter: Payer: Self-pay | Admitting: Nurse Practitioner

## 2021-12-01 VITALS — BP 100/64 | HR 87 | Ht 65.0 in | Wt 136.4 lb

## 2021-12-01 DIAGNOSIS — K5900 Constipation, unspecified: Secondary | ICD-10-CM | POA: Diagnosis not present

## 2021-12-01 NOTE — Progress Notes (Signed)
12/01/2021 Stephanie Bell [id number] 1940/05/25   Chief Complaint: RLQ pain, constipation   History of Present Illness: Stephanie Bell is an 81 year old female with a past medical history of anemia, kidney stones, interstitial cystitis, recurrent UTIs, CVA, Parkinson's disease, GERD, esophageal stricture, Zenker's diverticulum, s/p Nissen fundoplication in 3825 with revision 5 months later.   She presented to the ED 11/04/2021 with dizziness and nausea, felt like she might pass out.  She received IV fluids and her symptoms improved therefore she was discharged home.  She went back to the ED 8/19 with recurrent dizziness and feeling lightheaded.  BP 86/59.  Urinalysis showed a few bacteria but she recently completed a round of Augmentin as prescribed by her urologist for UTI and further treatment was not required.  She received IV fluids and her symptoms improved and she was discharged home.  She presents today for further evaluation regarding constipation which worsened after she was prescribed Sucralfate for abdominal pain by her primary care physician.  She reported having intermittent RLQ pain x 8 months and she was seen by her PCP 11/02/2021 who prescribed Sucralfate which resulted in constipation, nausea and possibly triggered her dizziness.  She contacted our office on 11/07/2021 and she was advised to stop Sucralfate, to take a few doses of Miralax and to schedule follow-up appointment as it had been 2 years since she had been seen in our office.  Currently, she feels well.  She stopped the sucralfate.  She denies having any further dizziness or lightheadedness.  No nausea.  She denies have any heartburn, dysphagia or upper abdominal pain. Her constipation resolved after she took 2-3 doses of MiraLAX which emptied her bowels.  Since then, she is taking 1 Dulcolax tab as needed, if no BM the next day she takes a half dose of MiraLAX which results in passing a BM.  Yesterday, she passed a  long formed stool and today she passed for balls of stool with relief.  No rectal bleeding or black stools.  he reported losing 50 pounds over the past year, ever, her weight in epic shows she has lost about 20 pounds over the past 2 years.  Her most recent colonoscopy was 10/16/2012 by Dr. Carlean Purl which identified nonbleeding small AVMs in the cecum, internal and external hemorrhoids.  No polyps.   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   Past Surgical History:  Procedure Laterality Date   BACK SURGERY  05-09-11   x 2 lumbar   CHOLECYSTECTOMY  05-09-11   laparoscopic   COLONOSCOPY  2002, 2008    rectal hyperplastic polyp 2002, diverticulosis 2008   ESOPHAGOGASTRODUODENOSCOPY     HERNIA REPAIR  05/16/2011   hiatal hernia   HIATAL HERNIA REPAIR  02/09/2012   Procedure: LAPAROSCOPIC REPAIR OF HIATAL HERNIA;  Surgeon: Pedro Earls, MD;  [facility];  Service: General;  Laterality: N/A;   INSERTION OF MESH  02/09/2012   Procedure: INSERTION OF MESH;  Surgeon: Pedro Earls, MD;  [facility];  Service: General;;   IRRIGATION AND DEBRIDEMENT SEBACEOUS CYST  05/16/2011   Procedure: IRRIGATION AND DEBRIDEMENT SEBACEOUS CYST;  Surgeon: Pedro Earls, MD;  [facility];  Service: General;  Laterality: N/A;  middle of chest on sternum   LAPAROSCOPIC NISSEN FUNDOPLICATION  0/98/1191   Procedure: LAPAROSCOPIC NISSEN FUNDOPLICATION;  Surgeon: Pedro Earls, MD;  [facility];   Service: General;  Laterality: N/A;  will also remove sebaceous cyst of chest wall   LAPAROSCOPIC NISSEN FUNDOPLICATION  47/82/9562   Procedure: LAPAROSCOPIC NISSEN FUNDOPLICATION;  Surgeon: Pedro Earls, MD;  [facility];  Service: General;  Laterality: N/A;  Re-do Laparoscopic Nissen    SEPTOPLASTY     TONSILLECTOMY  05-09-11   Tonsillectomy   UMBILICAL HERNIA REPAIR  05/16/2011   Procedure: HERNIA REPAIR UMBILICAL ADULT;  Surgeon: Pedro Earls, MD;  [facility];  Service: General;  Laterality: N/A;  with mesh    UPPER GASTROINTESTINAL ENDOSCOPY     VAGINAL HYSTERECTOMY         Latest Ref Rng & Units 11/05/2021    1:21 PM 11/04/2021    1:45 PM 09/18/2021    1:27 PM  CBC  WBC 4.0 - 10.5 K/uL 4.9  6.6  5.3   Hemoglobin 12.0 - 15.0 g/dL 12.3  14.0  13.9   Hematocrit 36.0 - 46.0 % 36.9  40.9  42.3   Platelets 150 - 400 K/uL 154  178  155       Latest Ref Rng & Units 11/05/2021    1:21 PM 11/04/2021    1:45 PM 09/18/2021    1:27 PM  CMP  Glucose 70 - 99 mg/dL 108  100  99   BUN 8 - 23 mg/dL '13  14  10   '$ Creatinine 0.44 - 1.00 mg/dL 0.82  0.85  0.90   Sodium 135 - 145 mmol/L 139  138  139   Potassium 3.5 - 5.1 mmol/L 3.4  3.8  3.9   Chloride 98 - 111 mmol/L 108  103  104   CO2 22 - 32 mmol/L '25  26  25   '$ Calcium 8.9 - 10.3 mg/dL 9.1  9.4  9.5   Total Protein 6.5 - 8.1 g/dL 6.1   6.9   Total Bilirubin 0.3 - 1.2 mg/dL 0.9   1.6   Alkaline Phos 38 - 126 U/L 50   57   AST 15 - 41 U/L 18   16   ALT 0 - 44 U/L 5   9   TSH 0.034  EGD 01/23/2019: - Abnormal esophageal motility, consistent with esophageal spasm. - Benign-appearing esophageal stenosis. Dilated. to 15.5 mm from 11 mm (scope did not pass initially - Small -medium paraesophageal hernia. - The examination was otherwise normal. - A Nissen fundoplication was found. The wrap appears loose. no signs of tiny zenker's diverticula suspected on ba studies - No specimens collected.  EGD 09/01/2014: 1) Ring-like  stricture at 35 cm. Dilated 15 and 16.5 mm with balloon and also partially dilated with scope initially. 2) Hiatal hernia 35-38 cm 3) Intact fundoplication 4) Otherwise normal  Colonoscopy 10/16/2012 by Dr. Carlean Purl: 1.  Normal colon except for a small nonbleeding AVM in cecum 2.  External hemorrhoids 3.  Internal hemorrhoids  Current Outpatient Medications on File Prior to Visit  Medication  Sig Dispense Refill   aspirin EC 81 MG tablet Take 81 mg by mouth daily.     carbidopa-levodopa (SINEMET IR) 25-100 MG tablet 1 TABLET IN THE MORNING, MAY ALSO TAKE 1/2 TO 1 TABLET AT MIDDAY AND IN THE EVENING 270 tablet 1   Cholecalciferol (VITAMIN D3) 5000 UNITS CAPS Take 5,000 Units by mouth once a week.      conjugated estrogens (PREMARIN) vaginal cream Apply one pea-sized amount around the opening of the urethra daily for 2 weeks, then 3 times weekly moving forward. 30 g 4   Cranberry-Vitamin C 84-20 MG CAPS Take 2 capsules by mouth daily.     mirabegron ER (MYRBETRIQ) 25 MG TB24 tablet Take 1 tablet (25 mg total) by mouth daily. 30 tablet 11   ondansetron (ZOFRAN-ODT) 4 MG disintegrating tablet Take 1 tablet (4 mg total) by mouth every 8 (eight) hours as needed for nausea or vomiting. 30 tablet 0   Cyanocobalamin (VITAMIN B-12 PO) Take 1,000 mcg by mouth daily.  (Patient not taking: Reported on 12/01/2021)     methylphenidate (RITALIN) 10 MG tablet Take 10 mg by mouth daily as needed. (Patient not taking: Reported on 12/01/2021)     sertraline (ZOLOFT) 25 MG tablet Take 25 mg by mouth daily. (Patient not taking: Reported on 12/01/2021)     No current facility-administered medications on file prior to visit.   Allergies  Allergen Reactions   Benzocaine Palpitations    Specific agent was Scandonest 2 %   Epinephrine Hcl (Nasal) Palpitations and Other (See Comments)    Reaction:  Tachycardia    Other Other (See Comments) and Palpitations    EPINEPHRINE Reaction:  Tachycardia    Amoxicillin-Pot  Clavulanate Diarrhea and Nausea And Vomiting   Cefuroxime Axetil Nausea Only   Nitrofurantoin Nausea And Vomiting   Omeprazole Diarrhea   Sucralfate Nausea Only   Trimethoprim Diarrhea and Nausea And Vomiting   Sulfa Antibiotics Swelling and Rash   Current Medications, Allergies, Past Medical History, Past Surgical History, Family History and Social History were reviewed in [employer] record.  Review of Systems:   Constitutional: + Weight loss. Negative for fever, sweats or chills.  Respiratory: Negative for shortness of breath.   Cardiovascular: Negative for chest pain, palpitations and leg swelling.  Gastrointestinal: See HPI.  Musculoskeletal: Negative for back pain or muscle aches.  Neurological: Negative for dizziness, headaches or paresthesias.    Physical Exam: BP 100/64   Pulse 87   Ht '5\' 5"'$  (1.651 m)   Wt 136 lb 6 oz (61.9 kg)   BMI 22.69 kg/m   Wt Readings from Last 3 Encounters:  12/01/21 136 lb 6 oz (61.9 kg)  11/09/21 138 lb 12.8 oz (63 kg)  11/05/21 138 lb 0.1 oz (62.6 kg)  08/21/2019     155 lbs   General: 81 year old female in no acute distress with parkinsonian tremors. Head: Normocephalic and atraumatic. Eyes: No scleral icterus. Conjunctiva pink . Ears: Normal auditory acuity. Mouth: Dentition intact. No ulcers or lesions.  Lungs: Clear throughout to auscultation. Heart: Regular rate and rhythm, no murmur. Abdomen: Soft, nontender and nondistended. No masses or hepatomegaly. Normal bowel sounds x 4 quadrants.  Rectal: Deferred. Musculoskeletal: Symmetrical with no gross deformities. Extremities: No edema. Neurological: Alert oriented x 4. No focal deficits.  Psychological: Alert and cooperative. Normal mood and affect  Assessment and Recommendations:  19) 81 year old female with chronic constipation which worsened after she took Sucralfate for abdominal pain as prescribed by  her PCP.  She has chronic constipation and her  Parkinson's disease is likely a contributing factor.   -Continue MiraLAX 1/2-1 full dose daily or every other day as needed Duclolax tab once every other day as needed -Drink 6 to 8 glasses of water daily  -Dietary fiber as tolerated  2) RLQ pain x 8 months, abated.  Negative abdominal exam today. -I discussed scheduling a CTAP if her abdominal pain recurs.   3) Colon cancer screening. Colonoscopy 11/2012 showed a normal colon with exception of a small nonbleeding AVM in the cecum. -No further screening colonoscopies recommended due to age  13) History of the benign esophageal stenosis, Zenker's diverticulum, s/p Nissen fundoplication in 8469 with subsequent revision.  Asymptomatic at this time.  5) Low TSH level 0. 034 per labs done in the ED 11/05/2021. I spoke to Mrs. Arquette and her husband over the phone following her office visit today regarding her low TSH level which indicates hyperthyroidism.  She was not previously aware of this test result.  She is in the process of selecting a new PCP and I advised her to establish a new PCP as soon as possible for further thyroid blood tests and for management regarding hyperthyroidism and future endocrinology evaluation.    6) Weight loss, 20lbs over the past 2 years. Hyperthyroidism likely a contributing  factor. -See plan in #5 -Consider CTAP if weight loss persists

## 2021-12-01 NOTE — Patient Instructions (Addendum)
1) Continue Dulcolax one tab every other day alternating with Miralax 1/2 dose every other day. May increase Miralax to one full capful as needed.  2) Drink 6 to 8 glasses of water daily   3) Contact our office if your abdominal pain or constipation recurs   If you are age 82 or older, your body mass index should be between 23-30. Your Body mass index is 22.69 kg/m. If this is out of the aforementioned range listed, please consider follow up with your Primary Care Provider.  If you are age 19 or younger, your body mass index should be between 19-25. Your Body mass index is 22.69 kg/m. If this is out of the aformentioned range listed, please consider follow up with your Primary Care Provider.   The [facility] providers would like to encourage you to use [facility] to communicate with providers for non-urgent requests or questions.  Due to long hold times on the telephone, sending your provider a message by [facility] may be a faster and more efficient way to get a response.  Please allow 48 business hours for a response.  Please remember that this is for non-urgent requests.   It was a pleasure to see you today!  Thank you for trusting me with your gastrointestinal care!

## 2021-12-05 DIAGNOSIS — R946 Abnormal results of thyroid function studies: Secondary | ICD-10-CM | POA: Diagnosis not present

## 2021-12-05 DIAGNOSIS — R8290 Unspecified abnormal findings in urine: Secondary | ICD-10-CM | POA: Diagnosis not present

## 2021-12-19 ENCOUNTER — Telehealth: Payer: Self-pay | Admitting: Physician Assistant

## 2021-12-19 NOTE — Telephone Encounter (Signed)
Spoke with patient and advised results, she will stop medication and call GI

## 2021-12-19 NOTE — Telephone Encounter (Signed)
Patient called the office today to report that the Myrbetriq seems to be working.  She is not experiencing leakage.  However, she is experiencing extreme constipation.  She has done 2 fleets enemas without success.   I advised her to stop taking the medication until she hears back from our office.   Please advise.

## 2021-12-19 NOTE — Telephone Encounter (Signed)
Stop Myrbetriq for now, though constipation is a very rare side effect of this medication and she previously tolerated it well. She has a history of chronic constipation and needs to follow up with her gastroenterologist to see if there could be any other causes. May consider resuming Myrbetriq in the future.

## 2021-12-20 ENCOUNTER — Encounter: Payer: Self-pay | Admitting: Medical Oncology

## 2021-12-20 ENCOUNTER — Other Ambulatory Visit: Payer: Self-pay

## 2021-12-20 ENCOUNTER — Emergency Department
Admission: EM | Admit: 2021-12-20 | Discharge: 2021-12-20 | Disposition: A | Discharge status: Home / Self Care | Payer: PPO | Attending: Emergency Medicine | Admitting: Emergency Medicine

## 2021-12-20 ENCOUNTER — Emergency Department: Payer: PPO

## 2021-12-20 DIAGNOSIS — K5900 Constipation, unspecified: Secondary | ICD-10-CM | POA: Diagnosis not present

## 2021-12-20 DIAGNOSIS — G20C Parkinsonism, unspecified: Secondary | ICD-10-CM | POA: Insufficient documentation

## 2021-12-20 DIAGNOSIS — Z9049 Acquired absence of other specified parts of digestive tract: Secondary | ICD-10-CM | POA: Diagnosis not present

## 2021-12-20 DIAGNOSIS — Z9889 Other specified postprocedural states: Secondary | ICD-10-CM | POA: Diagnosis not present

## 2021-12-20 NOTE — Discharge Instructions (Signed)
As we discussed please use a large capful of MiraLAX with a large glass of water with breakfast lunch and dinner until you begin having multiple bowel movements then you may discontinue the MiraLAX.  Return to the emergency department for any abdominal pain, vomiting or fever.

## 2021-12-20 NOTE — ED Triage Notes (Signed)
Pt from [facility] with reports that she has not had BM x 2 weeks. Denies pain. Denies other sx's.

## 2021-12-20 NOTE — ED Provider Notes (Signed)
   [facility] Provider Note    Event Date/Time   First MD Initiated Contact with Patient 12/20/21 1035     (approximate)  History   Chief Complaint: Constipation  HPI  Stephanie Bell is a 81 y.o. female with a past medical history of Parkinson's disease, prior CVA, gastric reflux, presents to the emergency department for constipation.  According to the patient for the past 2 weeks she has been constipated.  She tried an enema without relief.  Patient denies any abdominal pain.  Denies any nausea or vomiting.  Physical Exam   Triage Vital Signs: ED Triage Vitals  Enc Vitals Group     BP 12/20/21 1017 113/75     Pulse Rate 12/20/21 1017 78     Resp --      Temp 12/20/21 1017 97.7 F (36.5 C)     Temp Source 12/20/21 1017 Oral     SpO2 12/20/21 1017 97 %     Weight 12/20/21 1016 134 lb 7.7 oz (61 kg)     Height 12/20/21 1016 '5\' 5"'$  (1.651 m)     Head Circumference --      Peak Flow --      Pain Score 12/20/21 1016 0     Pain Loc --      Pain Edu? --      Excl. in [place]? --     Most recent vital signs: Vitals:   12/20/21 1017  BP: 113/75  Pulse: 78  Temp: 97.7 F (36.5 C)  SpO2: 97%    General: Awake, no distress.  CV:  Good peripheral perfusion.  Regular rate and rhythm  Resp:  Normal effort.  Equal breath sounds bilaterally.  Abd:  No distention.  Soft, nontender.  No rebound or guarding.  Benign abdomen.    ED Results / Procedures / Treatments   RADIOLOGY  I have reviewed and interpreted the x-ray images patient appears to have some constipation although I do not see any significant stool burden or signs of obstruction. X-ray read as mild constipation.   MEDICATIONS ORDERED IN ED: Medications - No data to display   IMPRESSION / MDM / [place] / ED COURSE  I reviewed the triage vital signs and the nursing notes.  Patient's presentation is most consistent with acute, uncomplicated illness.  Patient presents emergency  department for 2 weeks of constipation.  Denies any abdominal pain nausea vomiting.  Patient has a benign abdomen on exam.  She did states she tried a fleets enema without relief.  She tried taking Dulcolax and a dose of MiraLAX again without relief so she came to the emergency department.  Patient denies any pain at any point but states she knows it is not good to have stool in her body for this long so she came to the emergency department.  We will obtain x-ray images of the abdomen to evaluate.  X-ray shows constipation.  Overall the patient appears well.  I discussed with the patient using MiraLAX 17 g breakfast lunch and dinner with a glass of water.  Patient will follow-up with her doctor discussed return precautions.  FINAL CLINICAL IMPRESSION(S) / ED DIAGNOSES   Constipation  Note:  This document was prepared using Dragon voice recognition software and may include unintentional dictation errors.   Harvest Dark, MD 12/20/21 1134

## 2021-12-27 DIAGNOSIS — H5203 Hypermetropia, bilateral: Secondary | ICD-10-CM | POA: Diagnosis not present

## 2021-12-27 DIAGNOSIS — H524 Presbyopia: Secondary | ICD-10-CM | POA: Diagnosis not present

## 2021-12-27 DIAGNOSIS — H52223 Regular astigmatism, bilateral: Secondary | ICD-10-CM | POA: Diagnosis not present

## 2021-12-27 DIAGNOSIS — H2513 Age-related nuclear cataract, bilateral: Secondary | ICD-10-CM | POA: Diagnosis not present

## 2021-12-27 DIAGNOSIS — H0288A Meibomian gland dysfunction right eye, upper and lower eyelids: Secondary | ICD-10-CM | POA: Diagnosis not present

## 2021-12-27 DIAGNOSIS — H25013 Cortical age-related cataract, bilateral: Secondary | ICD-10-CM | POA: Diagnosis not present

## 2022-01-05 DIAGNOSIS — G20C Parkinsonism, unspecified: Secondary | ICD-10-CM | POA: Diagnosis not present

## 2022-01-05 DIAGNOSIS — K5900 Constipation, unspecified: Secondary | ICD-10-CM | POA: Diagnosis not present

## 2022-01-05 DIAGNOSIS — R634 Abnormal weight loss: Secondary | ICD-10-CM | POA: Diagnosis not present

## 2022-01-05 DIAGNOSIS — R110 Nausea: Secondary | ICD-10-CM | POA: Diagnosis not present

## 2022-01-05 DIAGNOSIS — R400 Somnolence: Secondary | ICD-10-CM | POA: Diagnosis not present

## 2022-01-05 DIAGNOSIS — E0590 Thyrotoxicosis, unspecified without thyrotoxic crisis or storm: Secondary | ICD-10-CM | POA: Diagnosis not present

## 2022-01-05 DIAGNOSIS — E785 Hyperlipidemia, unspecified: Secondary | ICD-10-CM | POA: Diagnosis not present

## 2022-02-13 DIAGNOSIS — M545 Low back pain, unspecified: Secondary | ICD-10-CM | POA: Diagnosis not present

## 2022-02-13 DIAGNOSIS — M461 Sacroiliitis, not elsewhere classified: Secondary | ICD-10-CM | POA: Diagnosis not present

## 2022-02-14 DIAGNOSIS — K5900 Constipation, unspecified: Secondary | ICD-10-CM | POA: Diagnosis not present

## 2022-02-14 DIAGNOSIS — K219 Gastro-esophageal reflux disease without esophagitis: Secondary | ICD-10-CM | POA: Diagnosis not present

## 2022-02-14 DIAGNOSIS — M545 Low back pain, unspecified: Secondary | ICD-10-CM | POA: Diagnosis not present

## 2022-02-14 DIAGNOSIS — R251 Tremor, unspecified: Secondary | ICD-10-CM | POA: Diagnosis not present

## 2022-02-14 DIAGNOSIS — I700 Atherosclerosis of aorta: Secondary | ICD-10-CM | POA: Diagnosis not present

## 2022-02-14 DIAGNOSIS — M461 Sacroiliitis, not elsewhere classified: Secondary | ICD-10-CM | POA: Diagnosis not present

## 2022-02-14 DIAGNOSIS — M5440 Lumbago with sciatica, unspecified side: Secondary | ICD-10-CM | POA: Diagnosis not present

## 2022-02-14 DIAGNOSIS — F909 Attention-deficit hyperactivity disorder, unspecified type: Secondary | ICD-10-CM | POA: Diagnosis not present

## 2022-02-14 DIAGNOSIS — E0590 Thyrotoxicosis, unspecified without thyrotoxic crisis or storm: Secondary | ICD-10-CM | POA: Diagnosis not present

## 2022-02-14 DIAGNOSIS — G20C Parkinsonism, unspecified: Secondary | ICD-10-CM | POA: Diagnosis not present

## 2022-02-15 DIAGNOSIS — M461 Sacroiliitis, not elsewhere classified: Secondary | ICD-10-CM | POA: Diagnosis not present

## 2022-02-15 DIAGNOSIS — M545 Low back pain, unspecified: Secondary | ICD-10-CM | POA: Diagnosis not present

## 2022-02-20 ENCOUNTER — Encounter: Payer: Self-pay | Admitting: Adult Health

## 2022-02-20 ENCOUNTER — Ambulatory Visit: Payer: PPO | Admitting: Adult Health

## 2022-02-20 VITALS — BP 118/79 | HR 72 | Ht 65.0 in | Wt 131.0 lb

## 2022-02-20 DIAGNOSIS — G20A1 Parkinson's disease without dyskinesia, without mention of fluctuations: Secondary | ICD-10-CM

## 2022-02-20 NOTE — Progress Notes (Signed)
PATIENT: Stephanie Bell DOB: 17-Feb-1941  REASON FOR VISIT: follow up HISTORY FROM: patient Chief Complaint  Patient presents with   Room 20    Room 20. Pt is here Alone. Pt is here for Follow Up on Parkinson Disease. Pt states that her memory is getting worse. Pt states that she is shaking more. Pt states that Parkinson medication is working. Pt states that she increased her medication. Pt states that walking is getting worse.       HISTORY OF PRESENT ILLNESS: Today 02/20/22:  [name] is an 81 year old female with a history of Parkinson's disease.  She returns today for follow-up.  She adjusted her medication. Currently taking Sinemet 25-100 mg 2 tablet in the morning, 2 tablet at lunch and 2 tablet at bedtime. Feels like that when she increased to this amount it decreased her tremors. Gait is slow. Shuffling more. No falls. Uses a cane. Reports that she had one incident that she got choked with a big tablet and when eating pork. Otherwise she does ok. Doesn't really sleep good due to anxiety.   She lives at home with her husband.  Reports that she is able to complete all ADLs    11/09/21: [name] is an 81 year old female with a history of Parkinson's disease.  She returns today for follow-up.  She is here today with her husband.  She reports that she has had UTI frequently over the last 30 years. Reports that whichever ABX she was using the formulary was changed. Her urologist has been working to find another ABX. Had UTI two weeks again was placed on nitrofurantoin for 7 days. Only took 5 days because she felt nauseous, not eating. She was then prescribed amoxicillin and took it for several days but began having same symptoms. She was then was given sucralfate but this dropped her BP (80/40) and ended in the ER on Friday. Went back on Saturday and got IV fluids. Reports that she is starting to feel better now. She is now only taking her Sinemet. only taking 1 tablet BID wasn't  aware she could take 1/2 tab during the day. Appetite is still decreased but making herself eat. Reports that she is drinking fluids. Able to complete ADLs with some assistance. Sleeping ok.   02/17/21:[name] is an 81 year old female with a history of idiopathic Parkinson's disease.  She returns today for follow-up.  She states back in September she did have a fall and broke her wrist.  She had the cast removed today.  She states that she typically takes Sinemet 1 tablet in the morning, half a tablet at lunch and 1 tablet in the evening.  Although she does state that sometimes she does not take the evening dose.  She denies any significant changes in her gait or balance.  She does use a cane when ambulating.  Reports mild tremor in the left hand.  Denies any trouble swallowing or chewing food.  Reports that sometimes she has a hard time sleeping but she is able to nap during the day if needed.  She denies any new symptoms.  Returns today for an evaluation.  HISTORY (copied from Dr. Cathren Laine note) 08/11/2020; DAT Scan was consistent with parkinson's disease. We discussed today. She went to [facility] and diagnosed with likely idiopathic parkinson's disease. She declines PT, I advised her to stay active. Given her orthostatic hypotension, increasing sinemet may pose a problem, Since a dosage increase in the future is most likely inevitable,  Sinemet may create challenges per [facility]. Since she was unable to tolerate compression stockings, counseling regarding adequate hydration, abdominal binder, medications such as midodrine or fludricortisone may all be needed and we discussed that today during the appointment. No swallowing difficulties, no falls at home, discussed fall risks. She feels stable. Tremors are not worsening. Not dropping things. We will continue to follow her and if needed in the future she can go back to [facility] if treatment presents a problem for [place] and her.    DAT Scan 06/03/20:  reviewed images Asymmetric decreased striatal Ioflupane activity as above. Loss of activity is greater on the RIGHT than the LEFT. This pattern can be seen in Parkinsonian syndromes   04/08/2020: Here for follow up. Today we discussed her symptoms and reviewed her past, she doesn't feel she has more memory problems than an 79 year should, she has to write down appointments and review schedule daily. We discussed driving and her reflexes may be reduced, she only goes to the beauty shop and places that are close by. She has excellent vision. Her night vision is not as good, I encouraged her not to drive at night and stay just a few miles from home to familiar locations. The Sinemet is helping. We had suggested a DAT scan in the past when her symptoms were milder, today she brings up the DAT Scan and wants to make sure she has PD and not essential tremor and we can order a DAT Scan. No falls. No postural symptoms. She uses a walker at home. She uses a cane outside the home and this works fine. She has a slight resting tremor ont he left, she states it is only because she is under stress. They declined PT at home for exercise. Not hypophonic. She was having severe orthostatic hypotension and now she can take the Sinemet without a problem. She talks in her sleep and can talk and hold a conversation in her sleep. Not acting out dreams. She has decreased smell and taste. No problems swallowing. No dyskinesias.    10/02/2019: Patient is here with her husband, this is the first time I have met her husband, we revieiwed patient's workup, history, I answered any questions and we discussed the diagnosis of parkinson's disease and I provided much information for patient and husband, we discussed exercise and reviewed lots of exercise options for PD and how important that is, patient states Feels like the Sinemet is really helping with tremor, no tremors since taking it, she feels her walking is k, she still uses a cane. She  went to a urologist a few months ago and she was having constipation problems, she was given miralax for 3 days and then a dulcolax. She is taking 1/2 the dose of miralax and since then she has been fine without miralax. She had a hiatal hernia and she had surgery and did great but coughed and it came loose and then she had the surgery again, she had an episode last May and she had a dry tablet and was eating breakfast and it was coming back up but this is not new, she threw up and no more incidents since then she tries to make sure she doesn't eat anything dry, she hashad to learn but this week she had some sticking in her throat with a dry tablet again and then she had some sticking and she spit up a little bit, she has slowed down and it is better and pill crusher  helps. She had a swallow test earlier this year, and she has had a swallow test and follows with her GI doctor regularly and is monitoring, no falls, husband says he doesn't let her carry the groceries and he laughs and says he doesn't make her mow the lawn either and laughs. Husband has placed some bars around the house for safety and she now has a vaccuuming robot. She will use a cane or a rolling walker.  She does not have sleep apnea, she does not snore, husband doesn't seem to feel she is very active in her sleep but she does have vivid dreams and had them for years but appears to be better the last several years. She is on daily medication now for UTIs for 6 months Trimethorprin and following for that with urology. No dyskinesias.    Interval history June 30, 2019: Patient was seen in the past for thalamic stroke and tremor, we did note she had some mild parkinsonian symptoms at last appointment in 2019(she never followed up with [place]), we discussed further testing and a DaTscan at that time she agreed to the [place] but then changed her mind and declined, since I last saw her in 2019 she has been diagnosed with Parkinson's disease and she has  already seen a neurologist.  She saw Dr. 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  MRI of the brain was ordered as well as vitamin B12 and vitamin D, she was started on carbidopa levodopa 3 times a day, MiraLAX Dulcolax and enema for constipation, she was talked to at length about her UTI, patient has a long history of UTIs.   Patient is here alone, As mentioned above, we saw her 2 years ago and recommend DatScan at that time which she declined but we suspected early parkinson's disease. In the 2 years(she never followed up) her symptoms have progressed and she is on sinemet currently see by another neurologist. She has left resting tremor, worsening, she had one fall but she denies falling more often, she is walking slowly, she reports vivid dreams. She doesn't feel her memory is as good (she doesn't remember meeting me in the past or [place] talking about her parkinson's symptoms). She has a son who is POA he lives in [place], other son lives in [place], daughter has done a lot of research on parkinson's disease she she has lots of support.    Patient complains of symptoms per HPI as well as the following symptoms: tremors . Pertinent negatives and positives per HPI. All others negative     Interval history 04/30/2017: Patient is here for follow-up of tremor.  Her last appointment we discussed that she had some mild parkinsonian symptoms.  Discussed the differential which includes Parkinson's disease.  The options were to start medication, follow clinically, or perform a DaTscan.  At that time she wanted to perform the DaTscan but she changed her mind.  Today we discussed parkinsonism in all the different reasons.  She continues to have tremors, resting and  action.  Discussed Parkinson's disease, also essential tremor.  Discussed medication management and Parkinson's disease including dopamine, dopamine agonists and others.  At this time she decided that she like to follow clinically which I agree with.  We will see her back in 6 months.  Otherwise she is doing excellent, she is playing tennis.    Interval history 12/25/2016; She has noticed tremor in her left hand noticed it first in may when driving and more recently when holding something heavy, she notices it at rest as well. She has some stress in her life. Happens every day, No caffeine. She is on aspirin. No tremor in the family. No FHx of Parkinson's. No slowing, no shuffling, no falls so far. No hallucinations or delusions. She reports poor memory. She uses a walker at home, she says she has slowed down.    HPI:  SAYURI RHAMES is a lovely 81 y.o. female here as a referral from Dr. Kary Kos for thalamic stroke. She has a past medical history of hypertension, hyperlipidemia, no diabetes. She presented to the hospital on February 24 after waking up with right foot and leg numbness. She also reported weakness. No other neurologic deficits. No aphasia, no dysphagia, no dysarthria, no other weakness or numbness, no altered mentation. Her husband gave her to aspirin may went to the emergency room. She had an MRI of the brain which was positive for stroke.  Sheis feeling much better. Hisband here and provides information as well. No more numbness and no more weakness. She feels scared that she had a stroke, doesn't understand why or where in the brain she had it. Never had a stroke before. There were no inciting events, she just woke up. No FHx of stroke.   Reviewed notes, labs and imaging from outside physicians, which showed:   ldl 138. [id number] wnl   Personally reviewed images with patient and her husband and agree with the following:   MRI HEAD FINDINGS   Calvarium and upper cervical spine: No focal  marrow signal abnormality.   Orbits: Negative.   Sinuses and Mastoids: Mucosal thickening on the left, overall mild. No effusion.   Brain: Ovoid 1 cm area of restricted diffusion in the lateral left thalamus, extending into the posterior limb internal capsule. No hemorrhagic conversion. No other territory of acute infarct. No evidence of major vessel occlusion.   Elsewhere, microvascular ischemic change is mild/expected for age. No previous infarct. No chronic hemorrhagic foci.   MRA HEAD FINDINGS   Symmetric carotid and vertebral arteries. Intact circle Willis with small anterior communicating artery.   Symmetric vertebrobasilar branching. Symmetric poor signal in distal vertebral and proximal basilar arteries is considered artifactual. These vessels have normal signal on T2 weighted conventional imaging.   Atherosclerotic type narrowings:   Distal left M1 segment high-grade tandem stenoses.   Moderate right proximal M2 segment stenosis.   Bilateral M3 narrowing, potentially overestimated due to artifact (poor signal in the bilateral A2 segments appears artifactual).   Bilateral atherosclerotic irregularity of the proximal PCA.     IMPRESSION: 1. 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 2. No acute arterial finding. 3. Intracranial atherosclerosis as described. No asymmetric disease in the proximal left PCA to correlate with #1.   Ultrasound carotids with no hemodynamically significant stenosis.    REVIEW OF SYSTEMS: Out of a complete 14 system review of symptoms, the patient complains only of the following symptoms, and all other reviewed systems are negative.  See HPI  ALLERGIES: Allergies  Allergen Reactions   Benzocaine Palpitations    Specific agent was Scandonest 2 %   Epinephrine Hcl (Nasal) Palpitations and Other (See Comments)    Reaction:  Tachycardia    Other Other (See Comments) and Palpitations    EPINEPHRINE Reaction:  Tachycardia     Amoxicillin-Pot Clavulanate Diarrhea and Nausea And Vomiting   Cefuroxime Axetil Nausea Only   Nitrofurantoin Nausea And Vomiting   Omeprazole Diarrhea   Sucralfate Nausea Only   Trimethoprim Diarrhea and Nausea And Vomiting   Sulfa Antibiotics Swelling and Rash    HOME MEDICATIONS: Outpatient Medications Prior to Visit  Medication Sig Dispense Refill   aspirin EC 81 MG tablet Take 81 mg by mouth daily.     carbidopa-levodopa (SINEMET IR) 25-100 MG tablet 1 TABLET IN THE MORNING, MAY ALSO TAKE 1/2 TO 1 TABLET AT MIDDAY AND IN THE EVENING 270 tablet 1   Cholecalciferol (VITAMIN D3) 5000 UNITS CAPS Take 5,000 Units by mouth once a week.      conjugated estrogens (PREMARIN) vaginal cream Apply one pea-sized amount around the opening of the urethra daily for 2 weeks, then 3 times weekly moving forward. 30 g 4   ondansetron (ZOFRAN-ODT) 4 MG disintegrating tablet Take 1 tablet (4 mg total) by mouth every 8 (eight) hours as needed for nausea or vomiting. 30 tablet 0   Cranberry-Vitamin C 84-20 MG CAPS Take 2 capsules by mouth daily. (Patient not taking: Reported on 02/20/2022)     No facility-administered medications prior to visit.    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    PAST SURGICAL HISTORY: Past Surgical History:  Procedure Laterality Date   BACK SURGERY  05-09-11   x 2 lumbar   CHOLECYSTECTOMY  05-09-11   laparoscopic   COLONOSCOPY  2002, 2008    rectal hyperplastic  polyp 2002, diverticulosis 2008   ESOPHAGOGASTRODUODENOSCOPY     HERNIA REPAIR  05/16/2011   hiatal hernia   HIATAL HERNIA REPAIR  02/09/2012   Procedure: LAPAROSCOPIC REPAIR OF HIATAL HERNIA;  Surgeon: Pedro Earls, MD;  Location: [facility];  Service: General;  Laterality: N/A;   INSERTION OF MESH  02/09/2012   Procedure: INSERTION OF MESH;  Surgeon: Pedro Earls, MD;  Location: [facility];  Service: General;;   IRRIGATION AND DEBRIDEMENT SEBACEOUS CYST  05/16/2011   Procedure: IRRIGATION AND DEBRIDEMENT SEBACEOUS CYST;  Surgeon: Pedro Earls, MD;  Location: [facility];  Service: General;  Laterality: N/A;  middle of chest on sternum   LAPAROSCOPIC NISSEN FUNDOPLICATION  07/12/9561   Procedure: LAPAROSCOPIC NISSEN FUNDOPLICATION;  Surgeon: Pedro Earls, MD;  Location: [facility];  Service: General;  Laterality: N/A;  will also remove sebaceous cyst of chest wall   LAPAROSCOPIC NISSEN FUNDOPLICATION  87/56/4332   Procedure: LAPAROSCOPIC NISSEN FUNDOPLICATION;  Surgeon: Pedro Earls, MD;  Location: [facility];  Service: General;  Laterality: N/A;  Re-do Laparoscopic Nissen    SEPTOPLASTY     TONSILLECTOMY  05-09-11   Tonsillectomy   UMBILICAL HERNIA REPAIR  05/16/2011   Procedure: HERNIA REPAIR UMBILICAL ADULT;  Surgeon: Pedro Earls, MD;  Location: [facility];  Service: General;  Laterality: N/A;  with mesh    UPPER GASTROINTESTINAL ENDOSCOPY     VAGINAL HYSTERECTOMY      FAMILY HISTORY: Family History  Problem Relation Age of Onset   Alzheimer's disease Mother    Stroke Mother    Diabetes Mother    Bipolar disorder Mother    Ulcers Father    Alcohol abuse Father    Ulcers Paternal Grandmother    Colon cancer Neg Hx    Rectal cancer Neg Hx    Stomach cancer Neg Hx    Esophageal cancer Neg Hx    Parkinson's disease Neg Hx     SOCIAL HISTORY: Social History   Socioeconomic History   Marital status: Married    Spouse name: [name]   Number of children: 3   Years of education:  College   Highest education level: Not on file  Occupational History   Not on file  Tobacco Use   Smoking status: Never   Smokeless tobacco: Never  Vaping Use   Vaping Use: Never used  Substance and Sexual Activity   Alcohol use: Not Currently    Alcohol/week: 4.0 standard drinks of alcohol    Types: 4 Glasses of wine per week   Drug use: No   Sexual activity: Yes    Birth control/protection: Post-menopausal  Other Topics Concern   Not on file  Social History Narrative   Lives with husband.   Caffeine use: occasional coke   Right handed   Enjoys tennis   3 children    no tobacco or drug use   Occasional glass of wine at night    Social Determinants of Health   Financial Resource Strain: Not on file  Food Insecurity: Not on file  Transportation Needs: Not on file  Physical Activity: Not on file  Stress: Not on file  Social Connections: Not on file  Intimate Partner Violence: Not on file      PHYSICAL EXAM  Vitals:   02/20/22 1347  BP: 118/79  Pulse: 72  Weight: 131 lb (59.4 kg)  Height: _0  (1.651 m)   Body mass index is 21.8 kg/m.      No data to display           Generalized: Well developed, in no acute distress   Neurological examination  Mentation: Alert oriented to time, place, history taking. Follows all commands speech and language fluent Cranial nerve II-XII: Pupils were equal round reactive to light. Extraocular movements were full, visual field were full on confrontational test. Facial sensation and strength were normal.  Head turning and shoulder shrug  were normal and symmetric. Motor: The motor testing reveals 5 over 5 strength of all 4 extremities. Good symmetric motor tone is noted throughout.  Moderate impairment finger taps and toe taps bilaterally.  Resting tremor noted in the upper extremities R>L Sensory: Sensory testing is intact to soft touch on all 4 extremities. No evidence of extinction is noted.  Coordination: Cerebellar  testing reveals good finger-nose-finger and heel-to-shin bilaterally.  Gait and station:  slightly stooped posture decreased stride.  Using cane when ambulating .   DIAGNOSTIC DATA (LABS, IMAGING, TESTING) - I reviewed patient records, labs, notes, testing and imaging myself where available.  Lab Results  Component Value Date   WBC 4.9 11/05/2021   HGB 12.3 11/05/2021   HCT 36.9 11/05/2021   MCV 87.2 11/05/2021   PLT 154 11/05/2021      Component Value Date/Time   NA 139 11/05/2021 1321  NA 141 12/25/2016 1153   NA 141 06/05/2011 0251   K 3.4 (L) 11/05/2021 1321   K 3.6 06/05/2011 0251   CL 108 11/05/2021 1321   CL 106 06/05/2011 0251   CO2 25 11/05/2021 1321   CO2 22 06/05/2011 0251   GLUCOSE 108 (H) 11/05/2021 1321   GLUCOSE 107 (H) 06/05/2011 0251   BUN 13 11/05/2021 1321   BUN 7 (L) 12/25/2016 1153   BUN 8 06/05/2011 0251   CREATININE 0.82 11/05/2021 1321   CREATININE 0.63 06/05/2011 0251   CALCIUM 9.1 11/05/2021 1321   CALCIUM 9.0 06/05/2011 0251   PROT 6.1 (L) 11/05/2021 1321   PROT 6.2 12/25/2016 1153   PROT 6.7 06/05/2011 0251   ALBUMIN 3.6 11/05/2021 1321   ALBUMIN 4.1 12/25/2016 1153   ALBUMIN 3.6 06/05/2011 0251   AST 18 11/05/2021 1321   AST 30 06/05/2011 0251   ALT 5 11/05/2021 1321   ALT 20 06/05/2011 0251   ALKPHOS 50 11/05/2021 1321   ALKPHOS 66 06/05/2011 0251   BILITOT 0.9 11/05/2021 1321   BILITOT 0.8 12/25/2016 1153   BILITOT 0.7 06/05/2011 0251   GFRNONAA >60 11/05/2021 1321   GFRNONAA >60 06/05/2011 0251   GFRAA >60 02/10/2019 1542   GFRAA >60 06/05/2011 0251   Lab Results  Component Value Date   CHOL 199 03/05/2017   HDL 49 03/05/2017   LDLCALC 127 (H) 03/05/2017   TRIG 117 03/05/2017   CHOLHDL 4.1 03/05/2017   Lab Results  Component Value Date   HGBA1C 5.5 03/05/2017   No results found for: "VITAMINB12" Lab Results  Component Value Date   TSH 0.034 (L) 11/05/2021      ASSESSMENT AND PLAN 81 y.o. 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 here with :  1.  Parkinson's disease  Continue Sinemet 25-100 mg 2 tablets TID Stay well-hydrated Advised if symptoms worsen or she develops new symptoms she should let us know Follow-up in 6 months or sooner if needed     Ward Givens, MSN, NP-C 02/20/2022, 1:57 PM [facility] [address] [place], [place] [address] [phone]

## 2022-02-21 DIAGNOSIS — R251 Tremor, unspecified: Secondary | ICD-10-CM | POA: Diagnosis not present

## 2022-02-21 DIAGNOSIS — F909 Attention-deficit hyperactivity disorder, unspecified type: Secondary | ICD-10-CM | POA: Diagnosis not present

## 2022-02-21 DIAGNOSIS — G20C Parkinsonism, unspecified: Secondary | ICD-10-CM | POA: Diagnosis not present

## 2022-02-21 DIAGNOSIS — M461 Sacroiliitis, not elsewhere classified: Secondary | ICD-10-CM | POA: Diagnosis not present

## 2022-02-21 DIAGNOSIS — K5900 Constipation, unspecified: Secondary | ICD-10-CM | POA: Diagnosis not present

## 2022-02-21 DIAGNOSIS — M545 Low back pain, unspecified: Secondary | ICD-10-CM | POA: Diagnosis not present

## 2022-02-21 DIAGNOSIS — K219 Gastro-esophageal reflux disease without esophagitis: Secondary | ICD-10-CM | POA: Diagnosis not present

## 2022-02-21 DIAGNOSIS — I700 Atherosclerosis of aorta: Secondary | ICD-10-CM | POA: Diagnosis not present

## 2022-02-21 DIAGNOSIS — E0590 Thyrotoxicosis, unspecified without thyrotoxic crisis or storm: Secondary | ICD-10-CM | POA: Diagnosis not present

## 2022-02-21 DIAGNOSIS — M5440 Lumbago with sciatica, unspecified side: Secondary | ICD-10-CM | POA: Diagnosis not present

## 2022-02-25 DIAGNOSIS — M25552 Pain in left hip: Secondary | ICD-10-CM | POA: Diagnosis not present

## 2022-03-02 ENCOUNTER — Other Ambulatory Visit: Payer: Self-pay

## 2022-03-02 ENCOUNTER — Emergency Department
Admission: EM | Admit: 2022-03-02 | Discharge: 2022-03-02 | Disposition: A | Discharge status: Home / Self Care | Payer: PPO | Attending: Emergency Medicine | Admitting: Emergency Medicine

## 2022-03-02 ENCOUNTER — Encounter: Payer: Self-pay | Admitting: Emergency Medicine

## 2022-03-02 ENCOUNTER — Emergency Department: Payer: PPO

## 2022-03-02 DIAGNOSIS — W19XXXA Unspecified fall, initial encounter: Secondary | ICD-10-CM | POA: Diagnosis not present

## 2022-03-02 DIAGNOSIS — G20A1 Parkinson's disease without dyskinesia, without mention of fluctuations: Secondary | ICD-10-CM | POA: Diagnosis not present

## 2022-03-02 DIAGNOSIS — S3992XA Unspecified injury of lower back, initial encounter: Secondary | ICD-10-CM | POA: Diagnosis not present

## 2022-03-02 DIAGNOSIS — M25552 Pain in left hip: Secondary | ICD-10-CM | POA: Insufficient documentation

## 2022-03-02 DIAGNOSIS — Y92002 Bathroom of unspecified non-institutional (private) residence single-family (private) house as the place of occurrence of the external cause: Secondary | ICD-10-CM | POA: Insufficient documentation

## 2022-03-02 DIAGNOSIS — I2510 Atherosclerotic heart disease of native coronary artery without angina pectoris: Secondary | ICD-10-CM | POA: Diagnosis not present

## 2022-03-02 DIAGNOSIS — M545 Low back pain, unspecified: Secondary | ICD-10-CM | POA: Insufficient documentation

## 2022-03-02 DIAGNOSIS — W1811XA Fall from or off toilet without subsequent striking against object, initial encounter: Secondary | ICD-10-CM | POA: Insufficient documentation

## 2022-03-02 DIAGNOSIS — I10 Essential (primary) hypertension: Secondary | ICD-10-CM | POA: Diagnosis not present

## 2022-03-02 DIAGNOSIS — M4316 Spondylolisthesis, lumbar region: Secondary | ICD-10-CM | POA: Diagnosis not present

## 2022-03-02 DIAGNOSIS — M47816 Spondylosis without myelopathy or radiculopathy, lumbar region: Secondary | ICD-10-CM | POA: Diagnosis not present

## 2022-03-02 DIAGNOSIS — R531 Weakness: Secondary | ICD-10-CM | POA: Diagnosis not present

## 2022-03-02 MED ORDER — HYDROCODONE-ACETAMINOPHEN 5-325 MG PO TABS
1.0000 | ORAL_TABLET | Freq: Once | ORAL | Status: AC
  Administered 2022-03-02: 1 via ORAL
  Filled 2022-03-02: qty 1

## 2022-03-02 MED ORDER — LIDOCAINE 5 % EX PTCH
1.0000 | MEDICATED_PATCH | CUTANEOUS | Status: DC
  Administered 2022-03-02: 1 via TRANSDERMAL
  Filled 2022-03-02: qty 1

## 2022-03-02 NOTE — ED Triage Notes (Signed)
Pt presents via EMS with complaints of chronic left hip pain. Pt slid off the toilet tonight, didn't hit her head, no LOC, not on thinners but had some sharp pain to her L hip. Pt declines imaging tonight. She notes having an MRI recently which showed no injury - pt educated that tonight was a new incident that may warrant an image- pt still declines at this time. Denies CP or SOB.

## 2022-03-02 NOTE — ED Provider Notes (Signed)
[facility] Provider Note    Event Date/Time   First MD Initiated Contact with Patient 03/02/22 [phone]     (approximate)   History   Chief Complaint Hip Pain   HPI  Stephanie Bell is a 81 y.o. female with past medical history of hypertension, CAD, stroke, Parkinson disease, and hiatal hernia status post Nissen fundoplication who presents to the ED complaining of hip pain.  Patient reports that she has had about 1 month of pain in her left posterior hip.  She has been seeing orthopedics for this, reports having an MRI about 2 weeks ago that was unremarkable.  She has been prescribed meloxicam with partial relief, but this morning attempted to sit down on the commode and fell to the side, landing on her left hip and back.  She now reports increasing pain in the hip and back, denies hitting her head or losing consciousness.  She has not taken anything for her pain this morning.     Physical Exam   Triage Vital Signs: ED Triage Vitals  Enc Vitals Group     BP 03/02/22 0539 122/78     Pulse Rate 03/02/22 0539 88     Resp 03/02/22 0539 18     Temp 03/02/22 0539 97.6 F (36.4 C)     Temp Source 03/02/22 0539 Oral     SpO2 03/02/22 0539 100 %     Weight 03/02/22 0542 131 lb 6.3 oz (59.6 kg)     Height 03/02/22 0542 '5\' 5"'$  (1.651 m)     Head Circumference --      Peak Flow --      Pain Score 03/02/22 0542 6     Pain Loc --      Pain Edu? --      Excl. in [place]? --     Most recent vital signs: Vitals:   03/02/22 0539  BP: 122/78  Pulse: 88  Resp: 18  Temp: 97.6 F (36.4 C)  SpO2: 100%    Constitutional: Alert and oriented. Eyes: Conjunctivae are normal. Head: Atraumatic. Nose: No congestion/rhinnorhea. Mouth/Throat: Mucous membranes are moist.  Cardiovascular: Normal rate, regular rhythm. Grossly normal heart sounds.  2+ radial and DP pulses bilaterally. Respiratory: Normal respiratory effort.  No retractions. Lungs CTAB. Gastrointestinal: Soft  and nontender. No distention. Musculoskeletal: No lower extremity tenderness nor edema.  Diffuse tenderness to palpation at left hip with no obvious deformity.  Tenderness to palpation noted at midline lumbar spine, no thoracic spinal tenderness to palpation. Neurologic:  Normal speech and language. No gross focal neurologic deficits are appreciated.    ED Results / Procedures / Treatments   Labs (all labs ordered are listed, but only abnormal results are displayed) Labs Reviewed - No data to display  RADIOLOGY Left hip x-ray reviewed and interpreted by me with no fracture or dislocation.  Lumbar spine x-ray reviewed and interpreted by me with no fracture or dislocation.  PROCEDURES:  Critical Care performed: No  Procedures   MEDICATIONS ORDERED IN ED: Medications  lidocaine (LIDODERM) 5 % 1 patch (1 patch Transdermal Patch Applied 03/02/22 0821)  HYDROcodone-acetaminophen (NORCO/VICODIN) 5-325 MG per tablet 1 tablet (1 tablet Oral Given 03/02/22 0820)     IMPRESSION / MDM / ASSESSMENT AND PLAN / ED COURSE  I reviewed the triage vital signs and the nursing notes.  81 y.o. female with past medical history of hypertension, CAD, stroke, Parkinson disease, and hiatal hernia status post Nissen fundoplication who presents to the ED complaining of acute on chronic left hip pain following fall off of her commode earlier this morning.  Patient's presentation is most consistent with acute complicated illness / injury requiring diagnostic workup.  Differential diagnosis includes, but is not limited to, hip fracture, dislocation, contusion, lumbar spinal injury.  Patient well-appearing and in no acute distress, vital signs are unremarkable and she is neurovascularly intact to her distal bilateral lower extremities.  She has diffuse tenderness to her left hip and lumbar spine with no obvious deformity.  We will further assess with x-ray, treat symptomatically  with Norco and Lidoderm patch.  No evidence of trauma to her head or neck.  X-ray imaging of left hip and lumbar spine are negative for acute process.  Patient reports feeling better following dose of hydrocodone with placement of Lidoderm patch.  She has been able to range her left hip with minimal pain, has been able to walk since the fall.  She is appropriate for discharge home with PCP or orthopedic follow-up, was counseled to return to the ED for new or worsening symptoms.  Patient and spouse agree with plan.      FINAL CLINICAL IMPRESSION(S) / ED DIAGNOSES   Final diagnoses:  Left hip pain  Fall, initial encounter     Rx / DC Orders   ED Discharge Orders     None        Note:  This document was prepared using Dragon voice recognition software and may include unintentional dictation errors.   Blake Divine, MD 03/02/22 [phone]

## 2022-03-02 NOTE — ED Notes (Signed)
Pt utilized the commode with a standby assist from this RN.

## 2022-03-02 NOTE — ED Notes (Signed)
See triage note Presents with left hip pain  states she developed "muscle "pain about 3 weeks ago   Denies any injury but states pain started after long car ride Unable to bear full wt

## 2022-03-06 ENCOUNTER — Other Ambulatory Visit: Payer: Self-pay | Admitting: Physician Assistant

## 2022-03-06 DIAGNOSIS — R3915 Urgency of urination: Secondary | ICD-10-CM

## 2022-03-22 DIAGNOSIS — I10 Essential (primary) hypertension: Secondary | ICD-10-CM | POA: Diagnosis not present

## 2022-03-22 DIAGNOSIS — R410 Disorientation, unspecified: Secondary | ICD-10-CM | POA: Diagnosis not present

## 2022-03-22 DIAGNOSIS — E785 Hyperlipidemia, unspecified: Secondary | ICD-10-CM | POA: Diagnosis not present

## 2022-03-29 DIAGNOSIS — M545 Low back pain, unspecified: Secondary | ICD-10-CM | POA: Diagnosis not present

## 2022-03-29 DIAGNOSIS — K222 Esophageal obstruction: Secondary | ICD-10-CM | POA: Diagnosis not present

## 2022-03-30 DIAGNOSIS — M4316 Spondylolisthesis, lumbar region: Secondary | ICD-10-CM | POA: Diagnosis not present

## 2022-03-30 DIAGNOSIS — M5416 Radiculopathy, lumbar region: Secondary | ICD-10-CM | POA: Diagnosis not present

## 2022-03-31 ENCOUNTER — Emergency Department: Payer: PPO

## 2022-03-31 ENCOUNTER — Emergency Department
Admission: EM | Admit: 2022-03-31 | Discharge: 2022-03-31 | Disposition: A | Discharge status: Home / Self Care | Payer: PPO | Attending: Emergency Medicine | Admitting: Emergency Medicine

## 2022-03-31 ENCOUNTER — Other Ambulatory Visit: Payer: Self-pay

## 2022-03-31 DIAGNOSIS — R0602 Shortness of breath: Secondary | ICD-10-CM | POA: Diagnosis not present

## 2022-03-31 DIAGNOSIS — G20C Parkinsonism, unspecified: Secondary | ICD-10-CM | POA: Insufficient documentation

## 2022-03-31 DIAGNOSIS — R0902 Hypoxemia: Secondary | ICD-10-CM | POA: Diagnosis not present

## 2022-03-31 DIAGNOSIS — Z1152 Encounter for screening for COVID-19: Secondary | ICD-10-CM | POA: Insufficient documentation

## 2022-03-31 DIAGNOSIS — R7981 Abnormal blood-gas level: Secondary | ICD-10-CM

## 2022-03-31 DIAGNOSIS — R9431 Abnormal electrocardiogram [ECG] [EKG]: Secondary | ICD-10-CM | POA: Diagnosis not present

## 2022-03-31 DIAGNOSIS — R053 Chronic cough: Secondary | ICD-10-CM | POA: Diagnosis not present

## 2022-03-31 LAB — BASIC METABOLIC PANEL
Anion gap: 7 (ref 5–15)
BUN: 11 mg/dL (ref 8–23)
CO2: 26 mmol/L (ref 22–32)
Calcium: 9.3 mg/dL (ref 8.9–10.3)
Chloride: 104 mmol/L (ref 98–111)
Creatinine, Ser: 0.69 mg/dL (ref 0.44–1.00)
GFR, Estimated: >60 mL/min (ref >60)
Glucose, Bld: 103 mg/dL — ABNORMAL HIGH (ref 70–99)
Potassium: 3.9 mmol/L (ref 3.5–5.1)
Sodium: 137 mmol/L (ref 135–145)

## 2022-03-31 LAB — CBC
HCT: 39.2 % (ref 36.0–46.0)
Hemoglobin: 13.4 g/dL (ref 12.0–15.0)
MCH: 29.5 pg (ref 26.0–34.0)
MCHC: 34.2 g/dL (ref 30.0–36.0)
MCV: 86.2 fL (ref 80.0–100.0)
Platelets: 148 10*3/uL — ABNORMAL LOW (ref 150–400)
RBC: 4.55 MIL/uL (ref 3.87–5.11)
RDW: 13.2 % (ref 11.5–15.5)
WBC: 3.9 10*3/uL — ABNORMAL LOW (ref 4.0–10.5)
nRBC: 0 % (ref 0.0–0.2)

## 2022-03-31 LAB — RESP PANEL BY RT-PCR (RSV, FLU A&B, COVID)  RVPGX2
Influenza A by PCR: NEGATIVE
Influenza B by PCR: NEGATIVE
Resp Syncytial Virus by PCR: NEGATIVE
SARS Coronavirus 2 by RT PCR: NEGATIVE

## 2022-03-31 LAB — TROPONIN I (HIGH SENSITIVITY): Troponin I (High Sensitivity): 7 ng/L (ref <18)

## 2022-03-31 NOTE — ED Triage Notes (Signed)
Pt presents to ED with c/o of SOB since last night. Pt denies fevers or chills. Pt denies any lung issues.

## 2022-03-31 NOTE — ED Provider Notes (Signed)
[facility] Provider Note    Event Date/Time   First MD Initiated Contact with Patient 03/31/22 1120     (approximate)  History   Chief Complaint: Low oxygen reading  HPI  Stephanie Bell is a 82 y.o. female with a past medical history of gastric reflux, Parkinson's disease, presents to the emergency department for low oxygen reading.  According to the patient she went to her doctor on Wednesday for a checkup.  Her daughter looked through the patient's paperwork today and noted that her oxygen level was recorded at 85% on her discharge paperwork.  Patient brought the discharge paperwork which does not fact show an oxygen saturation of 85%.  Patient states her daughter said that was too low and she needed to go to the emergency department which is why the patient came today.  Patient denies any shortness of breath over her baseline.  Denies any recent cough congestion or fever.  Patient has no chest pain or other symptoms.  Physical Exam   Triage Vital Signs: ED Triage Vitals  Enc Vitals Group     BP 03/31/22 0957 136/87     Pulse Rate 03/31/22 0957 83     Resp 03/31/22 0957 17     Temp 03/31/22 0957 97.8 F (36.6 C)     Temp Source 03/31/22 0957 Oral     SpO2 03/31/22 0957 100 %     Weight --      Height --      Head Circumference --      Peak Flow --      Pain Score 03/31/22 0959 0     Pain Loc --      Pain Edu? --      Excl. in [place]? --     Most recent vital signs: Vitals:   03/31/22 0957  BP: 136/87  Pulse: 83  Resp: 17  Temp: 97.8 F (36.6 C)  SpO2: 100%    General: Awake, no distress.  CV:  Good peripheral perfusion.  Regular rate and rhythm  Resp:  Normal effort.  Equal breath sounds bilaterally.  Abd:  No distention.  Soft, nontender.  No rebound or guarding.   ED Results / Procedures / Treatments   EKG  EKG viewed and interpreted by myself shows a normal sinus rhythm at 86 bpm with a narrow QRS, left axis deviation, largely  normal intervals with no concerning ST changes.  RADIOLOGY  I have reviewed and interpreted the chest x-ray images.  No consolidation seen on my evaluation. Radiology has read findings suggestive of COPD but no acute process.  MEDICATIONS ORDERED IN ED: Medications - No data to display   IMPRESSION / MDM / [place] / ED COURSE  I reviewed the triage vital signs and the nursing notes.  Patient's presentation is most consistent with acute presentation with potential threat to life or bodily function.  Patient presents emergency department for low oxygen reading.  Overall the patient appears well.  I have attached the patient to a pulse oximeter, she is reading 99 to 100% with a great waveform on room air.  Patient's lab work today shows a normal CBC, normal chemistry and a negative troponin.  Reassuring EKG and a clear chest x-ray.  Patient has no shortness of breath symptoms no chest pain.  Overall the patient appears very well.  Unfortunately believe the most likely explanation is in the erroneously recorded oxygen saturation at her primary care office.  Here  throughout my evaluation patient has maintain sats at 100% with a great waveform.  She is speaking in full and complete sentences.  Lab work and vitals are reassuring.  We will discharge patient with PCP follow-up.  Patient reassured and agreeable to plan.  FINAL CLINICAL IMPRESSION(S) / ED DIAGNOSES   Medical evaluation   Note:  This document was prepared using Dragon voice recognition software and may include unintentional dictation errors.   Harvest Dark, MD 03/31/22 1133

## 2022-03-31 NOTE — Discharge Instructions (Addendum)
You have been seen in the emergency department for a low oxygen level.  In the emergency department your oxygen level is normal.  Your lab work is normal your chest x-ray and EKG are normal as well.  Please follow-up with your doctor as needed.  Return to the emergency department for any symptoms concerning to yourself.

## 2022-04-03 DIAGNOSIS — R531 Weakness: Secondary | ICD-10-CM | POA: Diagnosis not present

## 2022-04-03 DIAGNOSIS — F32A Depression, unspecified: Secondary | ICD-10-CM | POA: Diagnosis not present

## 2022-04-03 DIAGNOSIS — I10 Essential (primary) hypertension: Secondary | ICD-10-CM | POA: Diagnosis not present

## 2022-04-04 DIAGNOSIS — F909 Attention-deficit hyperactivity disorder, unspecified type: Secondary | ICD-10-CM | POA: Diagnosis not present

## 2022-04-04 DIAGNOSIS — M461 Sacroiliitis, not elsewhere classified: Secondary | ICD-10-CM | POA: Diagnosis not present

## 2022-04-04 DIAGNOSIS — G20C Parkinsonism, unspecified: Secondary | ICD-10-CM | POA: Diagnosis not present

## 2022-04-04 DIAGNOSIS — R251 Tremor, unspecified: Secondary | ICD-10-CM | POA: Diagnosis not present

## 2022-04-04 DIAGNOSIS — M5440 Lumbago with sciatica, unspecified side: Secondary | ICD-10-CM | POA: Diagnosis not present

## 2022-04-04 DIAGNOSIS — M545 Low back pain, unspecified: Secondary | ICD-10-CM | POA: Diagnosis not present

## 2022-04-04 DIAGNOSIS — K219 Gastro-esophageal reflux disease without esophagitis: Secondary | ICD-10-CM | POA: Diagnosis not present

## 2022-04-04 DIAGNOSIS — I700 Atherosclerosis of aorta: Secondary | ICD-10-CM | POA: Diagnosis not present

## 2022-04-04 DIAGNOSIS — K5900 Constipation, unspecified: Secondary | ICD-10-CM | POA: Diagnosis not present

## 2022-04-04 DIAGNOSIS — E0590 Thyrotoxicosis, unspecified without thyrotoxic crisis or storm: Secondary | ICD-10-CM | POA: Diagnosis not present

## 2022-04-05 DIAGNOSIS — R29898 Other symptoms and signs involving the musculoskeletal system: Secondary | ICD-10-CM | POA: Diagnosis not present

## 2022-04-05 DIAGNOSIS — M5442 Lumbago with sciatica, left side: Secondary | ICD-10-CM | POA: Diagnosis not present

## 2022-04-05 DIAGNOSIS — M5416 Radiculopathy, lumbar region: Secondary | ICD-10-CM | POA: Diagnosis not present

## 2022-04-05 DIAGNOSIS — I700 Atherosclerosis of aorta: Secondary | ICD-10-CM | POA: Diagnosis not present

## 2022-05-03 DIAGNOSIS — R531 Weakness: Secondary | ICD-10-CM | POA: Diagnosis not present

## 2022-05-04 DIAGNOSIS — G20A1 Parkinson's disease without dyskinesia, without mention of fluctuations: Secondary | ICD-10-CM | POA: Diagnosis not present

## 2022-05-04 DIAGNOSIS — M5440 Lumbago with sciatica, unspecified side: Secondary | ICD-10-CM | POA: Diagnosis not present

## 2022-05-04 DIAGNOSIS — E860 Dehydration: Secondary | ICD-10-CM | POA: Diagnosis not present

## 2022-05-04 DIAGNOSIS — R531 Weakness: Secondary | ICD-10-CM | POA: Diagnosis not present

## 2022-05-04 DIAGNOSIS — G8929 Other chronic pain: Secondary | ICD-10-CM | POA: Diagnosis not present

## 2022-05-04 DIAGNOSIS — I10 Essential (primary) hypertension: Secondary | ICD-10-CM | POA: Diagnosis not present

## 2022-05-28 ENCOUNTER — Emergency Department
Admission: EM | Admit: 2022-05-28 | Discharge: 2022-05-28 | Disposition: A | Discharge status: Home / Self Care | Payer: HMO | Attending: Emergency Medicine | Admitting: Emergency Medicine

## 2022-05-28 ENCOUNTER — Other Ambulatory Visit: Payer: Self-pay

## 2022-05-28 ENCOUNTER — Emergency Department: Payer: HMO

## 2022-05-28 DIAGNOSIS — R531 Weakness: Secondary | ICD-10-CM | POA: Diagnosis not present

## 2022-05-28 DIAGNOSIS — I700 Atherosclerosis of aorta: Secondary | ICD-10-CM | POA: Diagnosis not present

## 2022-05-28 DIAGNOSIS — G20A1 Parkinson's disease without dyskinesia, without mention of fluctuations: Secondary | ICD-10-CM | POA: Diagnosis not present

## 2022-05-28 DIAGNOSIS — I10 Essential (primary) hypertension: Secondary | ICD-10-CM | POA: Diagnosis not present

## 2022-05-28 DIAGNOSIS — Z20822 Contact with and (suspected) exposure to covid-19: Secondary | ICD-10-CM | POA: Insufficient documentation

## 2022-05-28 LAB — COMPREHENSIVE METABOLIC PANEL
ALT: 13 U/L (ref 0–44)
AST: 19 U/L (ref 15–41)
Albumin: 3.6 g/dL (ref 3.5–5.0)
Alkaline Phosphatase: 44 U/L (ref 38–126)
Anion gap: 3 — ABNORMAL LOW (ref 5–15)
BUN: 15 mg/dL (ref 8–23)
CO2: 28 mmol/L (ref 22–32)
Calcium: 9.2 mg/dL (ref 8.9–10.3)
Chloride: 107 mmol/L (ref 98–111)
Creatinine, Ser: 0.63 mg/dL (ref 0.44–1.00)
GFR, Estimated: >60 mL/min (ref >60)
Glucose, Bld: 102 mg/dL — ABNORMAL HIGH (ref 70–99)
Potassium: 3.6 mmol/L (ref 3.5–5.1)
Sodium: 138 mmol/L (ref 135–145)
Total Bilirubin: 0.6 mg/dL (ref 0.3–1.2)
Total Protein: 5.9 g/dL — ABNORMAL LOW (ref 6.5–8.1)

## 2022-05-28 LAB — URINALYSIS, ROUTINE W REFLEX MICROSCOPIC
Bilirubin Urine: NEGATIVE
Glucose, UA: NEGATIVE mg/dL
Hgb urine dipstick: NEGATIVE
Ketones, ur: NEGATIVE mg/dL
Nitrite: NEGATIVE
Protein, ur: NEGATIVE mg/dL
Specific Gravity, Urine: 1.004 — ABNORMAL LOW (ref 1.005–1.030)
pH: 8 (ref 5.0–8.0)

## 2022-05-28 LAB — CBC WITH DIFFERENTIAL/PLATELET
Abs Immature Granulocytes: 0.02 10*3/uL (ref 0.00–0.07)
Basophils Absolute: 0 10*3/uL (ref 0.0–0.1)
Basophils Relative: 1 %
Eosinophils Absolute: 0.2 10*3/uL (ref 0.0–0.5)
Eosinophils Relative: 3 %
HCT: 39.2 % (ref 36.0–46.0)
Hemoglobin: 12.8 g/dL (ref 12.0–15.0)
Immature Granulocytes: 0 %
Lymphocytes Relative: 31 %
Lymphs Abs: 1.7 10*3/uL (ref 0.7–4.0)
MCH: 29.3 pg (ref 26.0–34.0)
MCHC: 32.7 g/dL (ref 30.0–36.0)
MCV: 89.7 fL (ref 80.0–100.0)
Monocytes Absolute: 0.6 10*3/uL (ref 0.1–1.0)
Monocytes Relative: 10 %
Neutro Abs: 3.1 10*3/uL (ref 1.7–7.7)
Neutrophils Relative %: 55 %
Platelets: 133 10*3/uL — ABNORMAL LOW (ref 150–400)
RBC: 4.37 MIL/uL (ref 3.87–5.11)
RDW: 13.9 % (ref 11.5–15.5)
WBC: 5.5 10*3/uL (ref 4.0–10.5)
nRBC: 0 % (ref 0.0–0.2)

## 2022-05-28 LAB — RESP PANEL BY RT-PCR (RSV, FLU A&B, COVID)  RVPGX2
Influenza A by PCR: NEGATIVE
Influenza B by PCR: NEGATIVE
Resp Syncytial Virus by PCR: NEGATIVE
SARS Coronavirus 2 by RT PCR: NEGATIVE

## 2022-05-28 LAB — MAGNESIUM: Magnesium: 2.1 mg/dL (ref 1.7–2.4)

## 2022-05-28 MED ORDER — LACTATED RINGERS IV BOLUS
1000.0000 mL | Freq: Once | INTRAVENOUS | Status: AC
  Administered 2022-05-28: 1000 mL via INTRAVENOUS

## 2022-05-28 NOTE — Discharge Instructions (Signed)
Thank you for choosing us for your health care today!  Please see your primary doctor this week for a follow up appointment.   Sometimes, in the early stages of certain disease courses it is difficult to detect in the emergency department evaluation -- so, it is important that you continue to monitor your symptoms and call your doctor right away or return to the emergency department if you develop any new or worsening symptoms.  Please go to the following website to schedule new (and existing) patient appointments:   [email or web address]  If you do not have a primary doctor try calling the following clinics to establish care:  If you have insurance:  [facility] [phone] [address] [place] [address]   [facility]  [phone] [address] [place] [address] [place] [address]   If you do not have insurance:  [facility]  [phone] [address] [place] [address]   The following is another list of primary care offices in the area who are accepting new patients at this time.  Please reach out to one of them directly and let them know you would like to schedule an appointment to follow up on an Emergency Department visit, and/or to establish a new primary care provider (PCP).  There are likely other primary care clinics in the are who are accepting new patients, but this is an excellent place to start:  [facility] Lead physician: Dr Angela Bacigalupo [address] [place], [place] [address] [phone]  [facility] Lead Physician: Dr Krichna Sowles [address], [place], [place] [address] [phone]  [facility]  Lead Physician: Dr Megan Johnson [address], [place], [place] [address] [phone]  [facility] [place] [facility] Lead Physician: Dr Alex Karamalegos [address], [place], [place] [address] [phone]  [facility] [place] Lead Physician: Dr Laura Berglund [address], [place], [place] [address] [phone]   It was my pleasure to care for you today.   Jamear Carbonneau S. Clance Baquero, MD  

## 2022-05-28 NOTE — ED Provider Notes (Signed)
I was asked to follow-up on viral testing for this patient and ambulate trial. Negative COVID flu and RSV.  Patient is ambulating with her walker and feels at baseline.  Currently asymptomatic.  I reviewed testing with her and gave her strict return precautions for any new or worsening symptoms and advised her to follow-up with her PMD - plan is for discharge.   Lucillie Garfinkel, MD 05/28/22 [phone]

## 2022-05-28 NOTE — ED Triage Notes (Signed)
Pt from me to [facility] ED via [facility] with c/o of sudden onset weakness this morning. Pt has a hx of Parkinson and was ambulating to her recliner when she could not move her arms or legs. Pt usually ambulates easily with her walker.

## 2022-05-28 NOTE — ED Provider Notes (Signed)
[facility] Provider Note    Event Date/Time   First MD Initiated Contact with Patient 05/28/22 [phone]     (approximate)   History   Weakness   HPI  Stephanie Bell is a 82 y.o. female who presents to the ED for evaluation of Weakness   I reviewed PCP visit from 1 month ago.  History of Parkinson's disease, HTN, chronic low back pain  Patient presents to the ED for evaluation of generalized weakness this morning.  Reports that she was sleeping in her chair last night and this morning she was unable to even get up.  She reports her body "just would not work."  Reports generalized symptoms without focal symptoms  She typically ambulates with a walker.  No falls or injuries.  No pain or focal weakness.  No recent illnesses.  She does tell me about another member of her church group who committed suicide 1 week ago because of progression of his Parkinson's disease.  She acknowledges that immediately after hearing the news she did consider suicide, but quickly tells me that she would never do this as "God does not want [place] to."  She reports no current suicidality and that she has no desire to do this.  No plans formulated.  Physical Exam   Triage Vital Signs: ED Triage Vitals  Enc Vitals Group     BP      Pulse      Resp      Temp      Temp src      SpO2      Weight      Height      Head Circumference      Peak Flow      Pain Score      Pain Loc      Pain Edu?      Excl. in [place]?     Most recent vital signs: Vitals:   05/28/22 0615  BP: (!) 156/94  Pulse: 82  Resp: 20  Temp: 97.7 F (36.5 C)  SpO2: 100%    General: Awake, no distress.  Pleasant and conversational.  Making jokes.  Parkinsonian tremors noted CV:  Good peripheral perfusion.  Resp:  Normal effort.  Abd:  No distention.  MSK:  No deformity noted.  Neuro:  No focal deficits appreciated. Cranial nerves II through XII intact 5/5 strength and sensation in all 4  extremities Parkinsonism is present. No pronator drift.  She is able to keep each leg elevated with hip flexion Other:     ED Results / Procedures / Treatments   Labs (all labs ordered are listed, but only abnormal results are displayed) Labs Reviewed  RESP PANEL BY RT-PCR (RSV, FLU A&B, COVID)  [id number]  COMPREHENSIVE METABOLIC PANEL  CBC WITH DIFFERENTIAL/PLATELET  URINALYSIS, ROUTINE W REFLEX MICROSCOPIC  MAGNESIUM    EKG Sinus rhythm with a rate of 79 bpm.  Leftward axis and normal intervals.  No for signs of acute ischemia.  RADIOLOGY   Official radiology report(s): No results found.  PROCEDURES and INTERVENTIONS:  .1-3 Lead EKG Interpretation  Performed by: Vladimir Crofts, MD Authorized by: Vladimir Crofts, MD     Interpretation: normal     ECG rate:  80   ECG rate assessment: normal     Rhythm: sinus rhythm     Ectopy: none     Conduction: normal     Medications  lactated ringers bolus 1,000 mL (has no administration in  time range)     IMPRESSION / MDM / ASSESSMENT AND PLAN / ED COURSE  I reviewed the triage vital signs and the nursing notes.  Differential diagnosis includes, but is not limited to, progression of Parkinson disease, dehydration, sepsis, UTI, pneumonia, electrolyte derangement  {Patient presents with symptoms of an acute illness or injury that is potentially life-threatening.  Pleasant 82 year old woman with history of Parkinson's disease presents with an episode of generalized weakness.  She reports resolving symptoms on arrival to the ED and reports no other concerns or complaints.  No pain.  Reassuring exam without evidence of focal weakness or stroke.  She has normal vital signs and reassuring EKG.  She is signed out to oncoming rider to follow-up on her serum workup.  Clinical Course as of 05/28/22 0641  Sun May 28, 2022  0631 Reassessed.  Patient reports feeling much better and that her symptoms have resolved.  She provided some additional  information [DS]    Clinical Course User Index [DS] Vladimir Crofts, MD     FINAL CLINICAL IMPRESSION(S) / ED DIAGNOSES   Final diagnoses:  Generalized weakness     Rx / DC Orders   ED Discharge Orders     None        Note:  This document was prepared using Dragon voice recognition software and may include unintentional dictation errors.   Vladimir Crofts, MD 05/28/22 [phone]

## 2022-05-29 ENCOUNTER — Ambulatory Visit (INDEPENDENT_AMBULATORY_CARE_PROVIDER_SITE_OTHER): Payer: HMO | Admitting: Adult Health

## 2022-05-29 ENCOUNTER — Encounter: Payer: Self-pay | Admitting: Adult Health

## 2022-05-29 VITALS — BP 121/80 | HR 78 | Ht 65.0 in | Wt 129.2 lb

## 2022-05-29 DIAGNOSIS — G20A1 Parkinson's disease without dyskinesia, without mention of fluctuations: Secondary | ICD-10-CM

## 2022-05-29 MED ORDER — CARBIDOPA-LEVODOPA 25-100 MG PO TABS: ORAL_TABLET | ORAL | 12 refills | Status: DC

## 2022-05-29 NOTE — Patient Instructions (Addendum)
Your Plan:  Increase Sinemet to 2 tablets in the AM and at lunch and 1 tablet at bedtime If you have another episode where you are unable to move your arms or legs-please go to the ED. Make sure you are staying well-hydrated If your symptoms worsen or you develop new symptoms please let us know.    Thank you for coming to see [place] at [facility]. I hope we have been able to provide you high quality care today.  You may receive a patient satisfaction survey over the next few weeks. We would appreciate your feedback and comments so that we may continue to improve ourselves and the health of our patients.

## 2022-05-29 NOTE — Progress Notes (Signed)
PATIENT: Stephanie Bell DOB: 05-04-1940  REASON FOR VISIT: follow up HISTORY FROM: patient Chief Complaint  Patient presents with   Follow-up    Pt in 66 with husband Pt Parkinson f/u  Pt states went to ER yesterday Pt states couldn't move Pt states ER  states weakness No Admission       HISTORY OF PRESENT ILLNESS: Today 05/29/22:  Stephanie Bell is a 82 y.o. female with a history of Parkinson's disease. Returns today for follow-up. Reports that she went to the ED yesterday. States that she could not move her arms or legs. She called EMS. Got fluids and was discharged. She is now back at her baseline.   Currently taking 1 tablet of sinemet in the AM and at bedtime and 2 tablets at lunch. Tremor responds well to sinemet. Uses a walker. Needs assistance with standing. No longer having any issues chewing or swallowing food.  Has someone in her church that can help her with physical therapy.Has an aide that comes in several times a week.  Upon chart review after the patient visit had ended the patient has been to see her primary care in January for left leg weakness.  She has seen orthopedist and they have recommended lumbar MRI and based off those results recommended injection.  According to her primary care notes she did have an injection but only helped for several weeks.   02/20/22: Stephanie Bell is an 82 year old female with a history of Parkinson's disease.  She returns today for follow-up.  She adjusted her medication. Currently taking Sinemet 25-100 mg 2 tablet in the morning, 2 tablet at lunch and 2 tablet at bedtime. Feels like that when she increased to this amount it decreased her tremors. Gait is slow. Shuffling more. No falls. Uses a cane. Reports that she had one incident that she got choked with a big tablet and when eating pork. Otherwise she does ok. Doesn't really sleep good due to anxiety.   She lives at home with her husband.  Reports that she is able to complete all  ADLs    11/09/21: Stephanie Bell is an 82 year old female with a history of Parkinson's disease.  She returns today for follow-up.  She is here today with her husband.  She reports that she has had UTI frequently over the last 30 years. Reports that whichever ABX she was using the formulary was changed. Her urologist has been working to find another ABX. Had UTI two weeks again was placed on nitrofurantoin for 7 days. Only took 5 days because she felt nauseous, not eating. She was then prescribed amoxicillin and took it for several days but began having same symptoms. She was then was given sucralfate but this dropped her BP (80/40) and ended in the ER on Friday. Went back on Saturday and got IV fluids. Reports that she is starting to feel better now. She is now only taking her Sinemet. only taking 1 tablet BID wasn't aware she could take 1/2 tab during the day. Appetite is still decreased but making herself eat. Reports that she is drinking fluids. Able to complete ADLs with some assistance. Sleeping ok.   02/17/21:Stephanie Bell is an 82 year old female with a history of idiopathic Parkinson's disease.  She returns today for follow-up.  She states back in September she did have a fall and broke her wrist.  She had the cast removed today.  She states that she typically takes Sinemet 1 tablet in the morning, half  a tablet at lunch and 1 tablet in the evening.  Although she does state that sometimes she does not take the evening dose.  She denies any significant changes in her gait or balance.  She does use a cane when ambulating.  Reports mild tremor in the left hand.  Denies any trouble swallowing or chewing food.  Reports that sometimes she has a hard time sleeping but she is able to nap during the day if needed.  She denies any new symptoms.  Returns today for an evaluation.  HISTORY (copied from Dr. Cathren Laine note) 08/11/2020; DAT Scan was consistent with parkinson's disease. We discussed today. She went to [facility] and diagnosed with likely idiopathic parkinson's disease. She declines PT, I advised her to stay active. Given her orthostatic hypotension, increasing sinemet may pose a problem, Since a dosage increase in the future is most likely inevitable, Sinemet may create challenges per [facility]. Since she was unable to tolerate compression stockings, counseling regarding adequate hydration, abdominal binder, medications such as midodrine or fludricortisone may all be needed and we discussed that today during the appointment. No swallowing difficulties, no falls at home, discussed fall risks. She feels stable. Tremors are not worsening. Not dropping things. We will continue to follow her and if needed in the future she can go back to [facility] if treatment presents a problem for [place] and her.    DAT Scan 06/03/20: reviewed images Asymmetric decreased striatal Ioflupane activity as above. Loss of activity is greater on the RIGHT than the LEFT. This pattern can be seen in Parkinsonian syndromes   04/08/2020: Here for follow up. Today we discussed her symptoms and reviewed her past, she doesn't feel she has more memory problems than an 79 year should, she has to write down appointments and review schedule daily. We discussed driving and her reflexes may be reduced, she only goes to the beauty shop and places that are close by. She has excellent vision. Her night vision is not as good, I encouraged her not to drive at night and stay just a few miles from home to familiar locations. The Sinemet is helping. We had suggested a DAT scan in the past when her symptoms were milder, today she brings up the DAT Scan and wants to make sure she has PD and not essential tremor and we can order a DAT Scan. No falls. No postural symptoms. She uses a walker at home. She uses a cane outside the home and this works fine. She has a slight resting tremor ont he left, she states it is only because she is under stress. They declined PT  at home for exercise. Not hypophonic. She was having severe orthostatic hypotension and now she can take the Sinemet without a problem. She talks in her sleep and can talk and hold a conversation in her sleep. Not acting out dreams. She has decreased smell and taste. No problems swallowing. No dyskinesias.    10/02/2019: Patient is here with her husband, this is the first time I have met her husband, we revieiwed patient's workup, history, I answered any questions and we discussed the diagnosis of parkinson's disease and I provided much information for patient and husband, we discussed exercise and reviewed lots of exercise options for PD and how important that is, patient states Feels like the Sinemet is really helping with tremor, no tremors since taking it, she feels her walking is k, she still uses a cane. She went to a urologist  a few months ago and she was having constipation problems, she was given miralax for 3 days and then a dulcolax. She is taking 1/2 the dose of miralax and since then she has been fine without miralax. She had a hiatal hernia and she had surgery and did great but coughed and it came loose and then she had the surgery again, she had an episode last May and she had a dry tablet and was eating breakfast and it was coming back up but this is not new, she threw up and no more incidents since then she tries to make sure she doesn't eat anything dry, she hashad to learn but this week she had some sticking in her throat with a dry tablet again and then she had some sticking and she spit up a little bit, she has slowed down and it is better and pill crusher helps. She had a swallow test earlier this year, and she has had a swallow test and follows with her GI doctor regularly and is monitoring, no falls, husband says he doesn't let her carry the groceries and he laughs and says he doesn't make her mow the lawn either and laughs. Husband has placed some bars around the house for safety and she  now has a vaccuuming robot. She will use a cane or a rolling walker.  She does not have sleep apnea, she does not snore, husband doesn't seem to feel she is very active in her sleep but she does have vivid dreams and had them for years but appears to be better the last several years. She is on daily medication now for UTIs for 6 months Trimethorprin and following for that with urology. No dyskinesias.    Interval history June 30, 2019: Patient was seen in the past for thalamic stroke and tremor, we did note she had some mild parkinsonian symptoms at last appointment in 2019(she never followed up with [place]), we discussed further testing and a DaTscan at that time she agreed to the [place] but then changed her mind and declined, since I last saw her in 2019 she has been diagnosed with Parkinson's disease and she has already seen a neurologist.  She saw Dr. 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  MRI of the brain was ordered as well as vitamin B12 and vitamin D, she was started on carbidopa levodopa 3 times a day, MiraLAX Dulcolax and enema for constipation, she was talked to at length about her UTI, patient has a long history of UTIs.   Patient is here alone, As mentioned above, we saw her 2 years ago and recommend DatScan at that time which she declined but we suspected early parkinson's disease. In the 2 years(she never followed up) her symptoms have progressed and she is on sinemet currently see by another neurologist. She has left resting tremor, worsening, she had one fall but she denies falling more often, she is walking slowly, she reports vivid dreams. She doesn't feel her memory is as good (she doesn't remember meeting me in the past or [place] talking about  her parkinson's symptoms). She has a son who is POA he lives in [place], other son lives in [place], daughter has done a lot of research on parkinson's disease she she has lots of support.  Patient complains of symptoms per HPI as well as the following symptoms: tremors . Pertinent negatives and positives per HPI. All others negative     Interval history 04/30/2017: Patient is here for follow-up of tremor.  Her last appointment we discussed that she had some mild parkinsonian symptoms.  Discussed the differential which includes Parkinson's disease.  The options were to start medication, follow clinically, or perform a DaTscan.  At that time she wanted to perform the DaTscan but she changed her mind.  Today we discussed parkinsonism in all the different reasons.  She continues to have tremors, resting and action.  Discussed Parkinson's disease, also essential tremor.  Discussed medication management and Parkinson's disease including dopamine, dopamine agonists and others.  At this time she decided that she like to follow clinically which I agree with.  We will see her back in 6 months.  Otherwise she is doing excellent, she is playing tennis.    Interval history 12/25/2016; She has noticed tremor in her left hand noticed it first in may when driving and more recently when holding something heavy, she notices it at rest as well. She has some stress in her life. Happens every day, No caffeine. She is on aspirin. No tremor in the family. No FHx of Parkinson's. No slowing, no shuffling, no falls so far. No hallucinations or delusions. She reports poor memory. She uses a walker at home, she says she has slowed down.    HPI:  Stephanie Bell is a lovely 82 y.o. female here as a referral from Dr. Kary Kos for thalamic stroke. She has a past medical history of hypertension, hyperlipidemia, no diabetes. She presented to the hospital on February 24 after waking up with right foot and leg numbness. She also  reported weakness. No other neurologic deficits. No aphasia, no dysphagia, no dysarthria, no other weakness or numbness, no altered mentation. Her husband gave her to aspirin may went to the emergency room. She had an MRI of the brain which was positive for stroke.  Sheis feeling much better. Hisband here and provides information as well. No more numbness and no more weakness. She feels scared that she had a stroke, doesn't understand why or where in the brain she had it. Never had a stroke before. There were no inciting events, she just woke up. No FHx of stroke.   Reviewed notes, labs and imaging from outside physicians, which showed:   ldl 138. [id number] wnl   Personally reviewed images with patient and her husband and agree with the following:   MRI HEAD FINDINGS   Calvarium and upper cervical spine: No focal marrow signal abnormality.   Orbits: Negative.   Sinuses and Mastoids: Mucosal thickening on the left, overall mild. No effusion.   Brain: Ovoid 1 cm area of restricted diffusion in the lateral left thalamus, extending into the posterior limb internal capsule. No hemorrhagic conversion. No other territory of acute infarct. No evidence of major vessel occlusion.   Elsewhere, microvascular ischemic change is mild/expected for age. No previous infarct. No chronic hemorrhagic foci.   MRA HEAD FINDINGS   Symmetric carotid and vertebral arteries. Intact circle Willis with small anterior communicating artery.   Symmetric vertebrobasilar branching. Symmetric poor signal in distal vertebral and proximal basilar arteries is considered artifactual. These vessels have normal signal on T2 weighted conventional imaging.   Atherosclerotic type narrowings:   Distal left M1 segment high-grade tandem stenoses.   Moderate right proximal M2 segment stenosis.   Bilateral M3 narrowing,  potentially overestimated due to artifact (poor signal in the bilateral A2 segments appears artifactual).    Bilateral atherosclerotic irregularity of the proximal PCA.     IMPRESSION: 1. 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 2. No acute arterial finding. 3. Intracranial atherosclerosis as described. No asymmetric disease in the proximal left PCA to correlate with #1.   Ultrasound carotids with no hemodynamically significant stenosis.    REVIEW OF SYSTEMS: Out of a complete 14 system review of symptoms, the patient complains only of the following symptoms, and all other reviewed systems are negative.  See HPI  ALLERGIES: Allergies  Allergen Reactions   Benzocaine Palpitations    Specific agent was Scandonest 2 %   Epinephrine Hcl (Nasal) Palpitations and Other (See Comments)    Reaction:  Tachycardia    Other Other (See Comments) and Palpitations    EPINEPHRINE Reaction:  Tachycardia    Amoxicillin-Pot Clavulanate Diarrhea and Nausea And Vomiting   Cefuroxime Axetil Nausea Only   Nitrofurantoin Nausea And Vomiting   Omeprazole Diarrhea   Sucralfate Nausea Only   Trimethoprim Diarrhea and Nausea And Vomiting   Sulfa Antibiotics Swelling and Rash    HOME MEDICATIONS: Outpatient Medications Prior to Visit  Medication Sig Dispense Refill   aspirin EC 81 MG tablet Take 81 mg by mouth daily.     carbidopa-levodopa (SINEMET IR) 25-100 MG tablet 1 TABLET IN THE MORNING, MAY ALSO TAKE 1/2 TO 1 TABLET AT MIDDAY AND IN THE EVENING 270 tablet 1   Cholecalciferol (VITAMIN D3) 5000 UNITS CAPS Take 5,000 Units by mouth once a week.      conjugated estrogens (PREMARIN) vaginal cream Apply one pea-sized amount around the opening of the urethra daily for 2 weeks, then 3 times weekly moving forward. 30 g 4   Cranberry-Vitamin C 84-20 MG CAPS Take 2 capsules by mouth daily.     Cyanocobalamin (B-12 PO) Take by mouth.     ondansetron (ZOFRAN-ODT) 4 MG disintegrating tablet Take 1 tablet (4 mg total) by mouth every 8 (eight) hours as needed for nausea or vomiting. 30 tablet 0    No facility-administered medications prior to visit.    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    PAST SURGICAL HISTORY: Past Surgical History:  Procedure Laterality Date   BACK SURGERY  05-09-11   x 2 lumbar   CHOLECYSTECTOMY  05-09-11   laparoscopic   COLONOSCOPY  2002, 2008    rectal hyperplastic polyp 2002, diverticulosis 2008   ESOPHAGOGASTRODUODENOSCOPY     HERNIA REPAIR  05/16/2011   hiatal hernia   HIATAL HERNIA REPAIR  02/09/2012   Procedure: LAPAROSCOPIC REPAIR OF HIATAL HERNIA;  Surgeon: Pedro Earls, MD;  Location: [facility];  Service: General;  Laterality: N/A;   INSERTION OF MESH  02/09/2012   Procedure: INSERTION OF MESH;  Surgeon: Pedro Earls, MD;  Location: [facility];  Service: General;;   IRRIGATION AND DEBRIDEMENT SEBACEOUS CYST  05/16/2011   Procedure: IRRIGATION AND DEBRIDEMENT SEBACEOUS CYST;  Surgeon: Pedro Earls, MD;  Location: [facility];  Service: General;  Laterality: N/A;  middle of chest on sternum   LAPAROSCOPIC NISSEN FUNDOPLICATION  [id number]   Procedure: LAPAROSCOPIC NISSEN FUNDOPLICATION;  Surgeon: Pedro Earls, MD;  Location: [facility];  Service: General;  Laterality: N/A;  will also remove sebaceous cyst of chest wall   LAPAROSCOPIC NISSEN FUNDOPLICATION  [id number]   Procedure: LAPAROSCOPIC NISSEN FUNDOPLICATION;  Surgeon: Pedro Earls, MD;  Location: [facility];  Service: General;  Laterality: N/A;  Re-do Laparoscopic Nissen     SEPTOPLASTY     TONSILLECTOMY  05-09-11   Tonsillectomy   UMBILICAL HERNIA REPAIR  05/16/2011   Procedure: HERNIA REPAIR UMBILICAL ADULT;  Surgeon: Pedro Earls, MD;  Location: [facility];  Service: General;  Laterality: N/A;  with mesh    UPPER GASTROINTESTINAL ENDOSCOPY     VAGINAL HYSTERECTOMY      FAMILY HISTORY: Family History  Problem Relation Age of Onset   Alzheimer's disease Mother    Stroke Mother    Diabetes Mother    Bipolar disorder Mother    Ulcers Father    Alcohol abuse Father    Ulcers Paternal Grandmother    Colon cancer Neg Hx    Rectal cancer Neg Hx    Stomach cancer Neg Hx    Esophageal cancer Neg Hx    Parkinson's disease Neg Hx     SOCIAL HISTORY: Social History   Socioeconomic History   Marital status: Married    Spouse name: [name]   Number of children: 3   Years of education: College   Highest education level: Not on file  Occupational History   Not on file  Tobacco Use   Smoking status: Never   Smokeless tobacco: Never  Vaping Use   Vaping Use: Never used  Substance and Sexual Activity   Alcohol use: Yes    Comment: occ   Drug use: No   Sexual activity: Yes    Birth control/protection: Post-menopausal  Other Topics Concern   Not on file  Social History Narrative   Lives with husband.   Caffeine use: occasional coke   Right handed   Enjoys tennis   3 children    no tobacco or drug use   Occasional glass of wine at night    Social Determinants of Health   Financial Resource Strain: Not on file  Food Insecurity: Not on file  Transportation Needs: Not on file  Physical Activity: Not on file  Stress: Not on file  Social Connections: Not on file  Intimate Partner Violence: Not on file      PHYSICAL EXAM  Vitals:   05/29/22 1353  BP: 121/80  Pulse: 78  Weight: 129 lb 3.2 oz (58.6 kg)  Height: '5\' 5"'$  (1.651 m)   Body mass index is 21.5 kg/m.     Generalized:  in no acute distress   Neurological examination   Mentation: Alert oriented to time, place, history taking. Follows all commands speech and language fluent Cranial nerve II-XII: Pupils were equal round reactive to light. Extraocular movements were full, visual field were full on confrontational test. Facial sensation and strength were normal.  Head turning and shoulder shrug  were normal and symmetric. Motor: The motor testing reveals 5 over 5 strength of all 4 extremities. Good symmetric motor tone is noted throughout.  Moderate-severe impairment  finger taps and toe taps bilaterally.  Resting tremor noted in the upper extremities R>L Sensory: Sensory testing is intact to soft touch on all 4 extremities. No evidence of extinction is noted.  Coordination: Cerebellar testing reveals good finger-nose-finger and heel-to-shin bilaterally.  Gait and station: In a wheelchair today.  Did not bring her walker with her today.  Therefore due to fall risk we did not ambulate .   DIAGNOSTIC DATA (LABS, IMAGING, TESTING) - I reviewed patient records, labs, notes, testing and imaging myself where available.  Lab Results  Component Value Date   WBC 5.5 05/28/2022   HGB 12.8 05/28/2022   HCT 39.2 05/28/2022   MCV 89.7 05/28/2022   PLT 133 (L) 05/28/2022      Component Value Date/Time   NA 138 05/28/2022 0614   NA 141 12/25/2016 1153   NA 141 06/05/2011 0251   K 3.6 05/28/2022 0614   K 3.6 06/05/2011 0251   CL 107 05/28/2022 0614   CL 106 06/05/2011 0251   CO2 28 05/28/2022 0614   CO2 22 06/05/2011 0251   GLUCOSE 102 (H) 05/28/2022 0614   GLUCOSE 107 (H) 06/05/2011 0251   BUN 15 05/28/2022 0614   BUN 7 (L) 12/25/2016 1153   BUN 8 06/05/2011 0251   CREATININE 0.63 05/28/2022 0614   CREATININE 0.63 06/05/2011 0251   CALCIUM 9.2 05/28/2022 0614   CALCIUM 9.0 06/05/2011 0251   PROT 5.9 (L) 05/28/2022 0614   PROT 6.2 12/25/2016 1153   PROT 6.7 06/05/2011 0251   ALBUMIN 3.6 05/28/2022 0614   ALBUMIN 4.1 12/25/2016 1153   ALBUMIN 3.6 06/05/2011  0251   AST 19 05/28/2022 0614   AST 30 06/05/2011 0251   ALT 13 05/28/2022 0614   ALT 20 06/05/2011 0251   ALKPHOS 44 05/28/2022 0614   ALKPHOS 66 06/05/2011 0251   BILITOT 0.6 05/28/2022 0614   BILITOT 0.8 12/25/2016 1153   BILITOT 0.7 06/05/2011 0251   GFRNONAA >60 05/28/2022 0614   GFRNONAA >60 06/05/2011 0251   GFRAA >60 02/10/2019 1542   GFRAA >60 06/05/2011 0251   Lab Results  Component Value Date   CHOL 199 03/05/2017   HDL 49 03/05/2017   LDLCALC 127 (H) 03/05/2017   TRIG 117 03/05/2017   CHOLHDL 4.1 03/05/2017   Lab Results  Component Value Date   HGBA1C 5.5 03/05/2017   No results found for: "VITAMINB12" Lab Results  Component Value Date   TSH 0.034 (L) 11/05/2021      ASSESSMENT AND PLAN 82 y.o. 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 here with :  1.  Parkinson's disease  Increase  Sinemet 25-100 mg 2 tablets in AM and at Lunch and continue 1 tablet at bedtime. Discussed physical therapy however she deferred on referral.  States that she has someone that she knows that can help her do this on her own. Stay well-hydrated Advised if symptoms worsen or she develops new symptoms she should let us know Follow-up in 6 months or sooner if needed     Ward Givens, MSN, NP-C 05/29/2022, 2:01 PM [facility] [address] [place], [place] [address] [phone]

## 2022-06-06 ENCOUNTER — Telehealth: Payer: Self-pay

## 2022-06-06 NOTE — Telephone Encounter (Signed)
        Patient  visited [facility] on 05/28/2022  for weakness.   Telephone encounter attempt :  1st  A HIPAA compliant voice message was left requesting a return call.  Instructed patient to call back at [phone].   [facility] Care Guide   ??[email or web address]  ?? [email or web address]   [email or web address]: [email or web address]  [email or web address]

## 2022-06-08 ENCOUNTER — Telehealth: Payer: Self-pay

## 2022-06-08 NOTE — Telephone Encounter (Signed)
     Patient  visit on 05/28/2022  at [facility] was for weakness.  Have you been able to follow up with your primary care physician? Yes  The patient was or was not able to obtain any needed medicine or equipment. Patient was able to obtain medication.  Are there diet recommendations that you are having difficulty following? No  Patient expresses understanding of discharge instructions and education provided has no other needs at this time. Yes   [place] Resource Care Guide   ??[email or web address]  ?? [id number]   Website: [email or web address]  [email or web address]

## 2022-06-09 ENCOUNTER — Telehealth: Payer: Self-pay

## 2022-06-09 NOTE — Telephone Encounter (Signed)
Entered in error

## 2022-06-20 DIAGNOSIS — E0590 Thyrotoxicosis, unspecified without thyrotoxic crisis or storm: Secondary | ICD-10-CM | POA: Diagnosis not present

## 2022-06-20 DIAGNOSIS — R110 Nausea: Secondary | ICD-10-CM | POA: Diagnosis not present

## 2022-06-20 DIAGNOSIS — E785 Hyperlipidemia, unspecified: Secondary | ICD-10-CM | POA: Diagnosis not present

## 2022-06-20 DIAGNOSIS — R634 Abnormal weight loss: Secondary | ICD-10-CM | POA: Diagnosis not present

## 2022-06-27 DIAGNOSIS — I10 Essential (primary) hypertension: Secondary | ICD-10-CM | POA: Diagnosis not present

## 2022-06-27 DIAGNOSIS — G20A1 Parkinson's disease without dyskinesia, without mention of fluctuations: Secondary | ICD-10-CM | POA: Diagnosis not present

## 2022-06-27 DIAGNOSIS — I2510 Atherosclerotic heart disease of native coronary artery without angina pectoris: Secondary | ICD-10-CM | POA: Diagnosis not present

## 2022-06-27 DIAGNOSIS — R29898 Other symptoms and signs involving the musculoskeletal system: Secondary | ICD-10-CM | POA: Diagnosis not present

## 2022-06-27 DIAGNOSIS — M1990 Unspecified osteoarthritis, unspecified site: Secondary | ICD-10-CM | POA: Diagnosis not present

## 2022-06-27 DIAGNOSIS — I6381 Other cerebral infarction due to occlusion or stenosis of small artery: Secondary | ICD-10-CM | POA: Diagnosis not present

## 2022-06-27 DIAGNOSIS — R2689 Other abnormalities of gait and mobility: Secondary | ICD-10-CM | POA: Diagnosis not present

## 2022-06-27 DIAGNOSIS — Z0000 Encounter for general adult medical examination without abnormal findings: Secondary | ICD-10-CM | POA: Diagnosis not present

## 2022-06-29 ENCOUNTER — Other Ambulatory Visit: Payer: Self-pay | Admitting: Neurology

## 2022-08-30 ENCOUNTER — Ambulatory Visit: Payer: HMO | Admitting: Neurology

## 2022-08-30 ENCOUNTER — Encounter: Payer: Self-pay | Admitting: Neurology

## 2022-08-30 VITALS — BP 108/70 | HR 74 | Ht 65.5 in | Wt 138.8 lb

## 2022-08-30 DIAGNOSIS — G20A1 Parkinson's disease without dyskinesia, without mention of fluctuations: Secondary | ICD-10-CM | POA: Diagnosis not present

## 2022-08-30 DIAGNOSIS — I6381 Other cerebral infarction due to occlusion or stenosis of small artery: Secondary | ICD-10-CM | POA: Diagnosis not present

## 2022-08-30 MED ORDER — ONDANSETRON 4 MG PO TBDP: 4.0000 mg | ORAL_TABLET | Freq: Three times a day (TID) | ORAL | 0 refills | Status: DC | PRN

## 2022-08-30 MED ORDER — CARBIDOPA-LEVODOPA 25-100 MG PO TABS: ORAL_TABLET | ORAL | 4 refills | Status: AC

## 2022-08-30 NOTE — Progress Notes (Signed)
[facility]    Provider:  Dr Lucia Gaskins Referring Provider: [name], MD Primary Care Physician:  [name], MD  CC:  parkinson's disease  She is eating well, drinking well, gained some weight, blood pressure is on the low side today, not symptomatic, no dizziness. Here with caregiver lisa. No falls. "She is doing amazing". She takes 2 sinemet in the morning and 2 ibefore bed. Discussed avoid protein with it. We set her up with [facility] but she didn;t want to go back despite encouragement.  Would recommend taking other 2 tabs at dinner time. Try to separate Sinemet from food (especially protein-rich foods like meat, dairy, eggs) by about 30-60 mins - this will help the absorption of the medication. If you have some nausea with the medication, you can take it with some light food like crackers or ginger ale. She goes to sleep 830pm-9pm. She eats about 5-6 could take pills at 4pm. Overall doing great. She walks around her house, transfers and goes to the bathroom alone. Reminded to get primary care to do skin check due to increased risk of skin cancer. No drooling or problems with secretion. Nose runs. She says she is happy. They stay active. Gets hair done.   Patient complains of symptoms per HPI as well as the following symptoms: none . Pertinent negatives and positives per HPI. All others negative  08/11/2020; DAT Scan was consistent with parkinson's disease. We discussed today. 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 She declines PT, I advised her to stay active. Given her orthostatic hypotension, increasing sinemet may pose a problem, Since a dosage increase in the future is most likely inevitable, Sinemet may create challenges per [facility]. Since she was unable to tolerate compression stockings, counseling regarding adequate hydration, abdominal binder, medications such as midodrine or fludricortisone may all be needed and we  discussed that today during the appointment. No swallowing difficulties, no falls at home, discussed fall risks. She feels stable. Tremors are not worsening. Not dropping things. We will continue to follow her and if needed in the future she can go back to [facility] if treatment presents a problem for [place] and her.   DAT Scan 06/03/20: reviewed images Asymmetric decreased striatal Ioflupane activity as above. Loss of activity is greater on the RIGHT than the LEFT. This pattern can be seen in Parkinsonian syndromes  04/08/2020: Here for follow up. Today we discussed her symptoms and reviewed her past, she doesn't feel she has more memory problems than an 79 year should, she has to write down appointments and review schedule daily. We discussed driving and her reflexes may be reduced, she only goes to the beauty shop and places that are close by. She has excellent vision. Her night vision is not as good, I encouraged her not to drive at night and stay just a few miles from home to familiar locations. The Sinemet is helping. We had suggested a DAT scan in the past when her symptoms were milder, today she brings up the DAT Scan and wants to make sure she has PD and not essential tremor and we can order a DAT Scan. No falls. No postural symptoms. She uses a walker at home. She uses a cane outside the home and this works fine. She has a slight resting tremor ont he left, she states it is only because she is under stress. They declined PT at home for exercise. Not hypophonic. She was having severe  orthostatic hypotension and now she can take the Sinemet without a problem. She talks in her sleep and can talk and hold a conversation in her sleep. Not acting out dreams. She has decreased smell and taste. No problems swallowing. No dyskinesias.   10/02/2019: Patient is here with her husband, this is the first time I have met her husband, we revieiwed patient's workup, history, I answered any questions and we discussed  the diagnosis of parkinson's disease and I provided much information for patient and husband, we discussed exercise and reviewed lots of exercise options for PD and how important that is, patient states Feels like the Sinemet is really helping with tremor, no tremors since taking it, she feels her walking is k, she still uses a cane. She went to a urologist a few months ago and she was having constipation problems, she was given miralax for 3 days and then a dulcolax. She is taking 1/2 the dose of miralax and since then she has been fine without miralax. She had a hiatal hernia and she had surgery and did great but coughed and it came loose and then she had the surgery again, she had an episode last May and she had a dry tablet and was eating breakfast and it was coming back up but this is not new, she threw up and no more incidents since then she tries to make sure she doesn't eat anything dry, she hashad to learn but this week she had some sticking in her throat with a dry tablet again and then she had some sticking and she spit up a little bit, she has slowed down and it is better and pill crusher helps. She had a swallow test earlier this year, and she has had a swallow test and follows with her GI doctor regularly and is monitoring, no falls, husband says he doesn't let her carry the groceries and he laughs and says he doesn't make her mow the lawn either and laughs. Husband has placed some bars around the house for safety and she now has a vaccuuming robot. She will use a cane or a rolling walker.  She does not have sleep apnea, she does not snore, husband doesn't seem to feel she is very active in her sleep but she does have vivid dreams and had them for years but appears to be better the last several years. She is on daily medication now for UTIs for 6 months Trimethorprin and following for that with urology. No dyskinesias.   Interval history June 30, 2019: Patient was seen in the past for thalamic  stroke and tremor, we did note she had some mild parkinsonian symptoms at last appointment in 2019(she never followed up with [place]), we discussed further testing and a DaTscan at that time she agreed to the DaTscan but then changed her mind and declined, since I last saw her in 2019 she has been diagnosed with Parkinson's disease and she has already seen a neurologist.  She saw Dr. Sherryll Burger in March 2021, I reviewed his notes which showed left upper extremity resting tremors, imbalance and history of REM sleep disorder, show shuffling gait, decreased left arm swing, slowness, stiffness, falls and constipation, diagnosed with parkinsonism likely idiopathic Parkinson's disease.  MRI of the brain was ordered as well as vitamin B12 and vitamin D, she was started on carbidopa levodopa 3 times a day, MiraLAX Dulcolax and enema for constipation, she was talked to at length about her UTI, patient has a long history  of UTIs.  Patient is here alone, As mentioned above, we saw her 2 years ago and recommend DatScan at that time which she declined but we suspected early parkinson's disease. In the 2 years(she never followed up) her symptoms have progressed and she is on sinemet currently see by another neurologist. She has left resting tremor, worsening, she had one fall but she denies falling more often, she is walking slowly, she reports vivid dreams. She doesn't feel her memory is as good (she doesn't remember meeting me in the past or [place] talking about her parkinson's symptoms). She has a son who is POA he lives in [place], other son lives in [place], daughter has done a lot of research on parkinson's disease she she has lots of support.   Patient complains of symptoms per HPI as well as the following symptoms: tremors . Pertinent negatives and positives per HPI. All others negative   Interval history 04/30/2017: Patient is here for follow-up of tremor.  Her last appointment we discussed that she had some mild  parkinsonian symptoms.  Discussed the differential which includes Parkinson's disease.  The options were to start medication, follow clinically, or perform a DaTscan.  At that time she wanted to perform the DaTscan but she changed her mind.  Today we discussed parkinsonism in all the different reasons.  She continues to have tremors, resting and action.  Discussed Parkinson's disease, also essential tremor.  Discussed medication management and Parkinson's disease including dopamine, dopamine agonists and others.  At this time she decided that she like to follow clinically which I agree with.  We will see her back in 6 months.  Otherwise she is doing excellent, she is playing tennis.   Interval history 12/25/2016; She has noticed tremor in her left hand noticed it first in may when driving and more recently when holding something heavy, she notices it at rest as well. She has some stress in her life. Happens every day, No caffeine. She is on aspirin. No tremor in the family. No FHx of Parkinson's. No slowing, no shuffling, no falls so far. No hallucinations or delusions. She reports poor memory. She uses a walker at home, she says she has slowed down.    HPI:  Stephanie Bell is a lovely 82 y.o. female here as a referral from Dr. Burnett Sheng for thalamic stroke. She has a past medical history of hypertension, hyperlipidemia, no diabetes. She presented to the hospital on February 24 after waking up with right foot and leg numbness. She also reported weakness. No other neurologic deficits. No aphasia, no dysphagia, no dysarthria, no other weakness or numbness, no altered mentation. Her husband gave her to aspirin may went to the emergency room. She had an MRI of the brain which was positive for stroke.  Sheis feeling much better. Hisband here and provides information as well. No more numbness and no more weakness. She feels scared that she had a stroke, doesn't understand why or where in the brain she had it. Never had  a stroke before. There were no inciting events, she just woke up. No FHx of stroke.   Reviewed notes, labs and imaging from outside physicians, which showed:   ldl 138. [id number] wnl   Personally reviewed images with patient and her husband and agree with the following:   MRI HEAD FINDINGS   Calvarium and upper cervical spine: No focal marrow signal abnormality.   Orbits: Negative.   Sinuses and Mastoids: Mucosal thickening on the left, overall mild.  No effusion.   Brain: Ovoid 1 cm area of restricted diffusion in the lateral left thalamus, extending into the posterior limb internal capsule. No hemorrhagic conversion. No other territory of acute infarct. No evidence of major vessel occlusion.   Elsewhere, microvascular ischemic change is mild/expected for age. No previous infarct. No chronic hemorrhagic foci.   MRA HEAD FINDINGS   Symmetric carotid and vertebral arteries. Intact circle Willis with small anterior communicating artery.   Symmetric vertebrobasilar branching. Symmetric poor signal in distal vertebral and proximal basilar arteries is considered artifactual. These vessels have normal signal on T2 weighted conventional imaging.   Atherosclerotic type narrowings:   Distal left M1 segment high-grade tandem stenoses.   Moderate right proximal M2 segment stenosis.   Bilateral M3 narrowing, potentially overestimated due to artifact (poor signal in the bilateral A2 segments appears artifactual).   Bilateral atherosclerotic irregularity of the proximal PCA.     IMPRESSION: 1. 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 2. No acute arterial finding. 3. Intracranial atherosclerosis as described. No asymmetric disease in the proximal left PCA to correlate with #1.   Ultrasound carotids with no hemodynamically significant stenosis.  Review of Systems: Patient complains of symptoms per HPI as well as the following symptoms: tremors, UTI Pertinent negatives and  positives per HPI. All others negative    Social History   Socioeconomic History   Marital status: Married    Spouse name: [name]   Number of children: 3   Years of education: College   Highest education level: Not on file  Occupational History   Not on file  Tobacco Use   Smoking status: Never   Smokeless tobacco: Never  Vaping Use   Vaping Use: Never used  Substance and Sexual Activity   Alcohol use: Yes    Comment: occ   Drug use: No   Sexual activity: Yes    Birth control/protection: Post-menopausal  Other Topics Concern   Not on file  Social History Narrative   Lives with husband.   Caffeine use: occasional coke   Right handed   Enjoys tennis   3 children    no tobacco or drug use   Occasional glass of wine at night    Social Determinants of Health   Financial Resource Strain: Not on file  Food Insecurity: Not on file  Transportation Needs: Not on file  Physical Activity: Not on file  Stress: Not on file  Social Connections: Not on file  Intimate Partner Violence: Not on file    Family History  Problem Relation Age of Onset   Alzheimer's disease Mother    Stroke Mother    Diabetes Mother    Bipolar disorder Mother    Ulcers Father    Alcohol abuse Father    Ulcers Paternal Grandmother    Colon cancer Neg Hx    Rectal cancer Neg Hx    Stomach cancer Neg Hx    Esophageal cancer Neg Hx    Parkinson's disease Neg Hx     Past Medical History:  Diagnosis Date   ADD (attention deficit disorder)    Anemia    Esophageal stricture 11/04/2014   GERD (gastroesophageal reflux disease)    H/O hiatal hernia    Hx: UTI (urinary tract infection)    Interstitial cystitis    Kidney stone    Lumbago    Palpitations 02/2017   Paraesophageal hernia    Parkinson disease    Parkinson's disease 07/2019   Pneumonia 05-09-11  2011 last time   Stroke [facility])    Weight loss    Wound disruption, post-op, skin    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     Past Surgical History:  Procedure Laterality Date   BACK SURGERY  05-09-11   x 2 lumbar   CHOLECYSTECTOMY  05-09-11   laparoscopic   COLONOSCOPY  2002, 2008    rectal hyperplastic polyp 2002, diverticulosis 2008   ESOPHAGOGASTRODUODENOSCOPY     HERNIA REPAIR  05/16/2011   hiatal hernia   HIATAL HERNIA REPAIR  02/09/2012   Procedure: LAPAROSCOPIC REPAIR OF HIATAL HERNIA;  Surgeon: Valarie Merino, MD;  Location: [facility];  Service: General;  Laterality: N/A;   INSERTION OF MESH  02/09/2012   Procedure: INSERTION OF MESH;  Surgeon: Valarie Merino, MD;  Location: [facility];  Service: General;;   IRRIGATION AND DEBRIDEMENT SEBACEOUS CYST  05/16/2011   Procedure: IRRIGATION AND DEBRIDEMENT SEBACEOUS CYST;  Surgeon: Valarie Merino, MD;  Location: [facility];  Service: General;  Laterality: N/A;  middle of chest on sternum   LAPAROSCOPIC NISSEN FUNDOPLICATION  05/16/2011   Procedure: LAPAROSCOPIC NISSEN FUNDOPLICATION;  Surgeon: Valarie Merino, MD;  Location: [facility];  Service: General;  Laterality: N/A;  will also remove sebaceous cyst of chest wall   LAPAROSCOPIC NISSEN FUNDOPLICATION  02/09/2012   Procedure: LAPAROSCOPIC NISSEN FUNDOPLICATION;  Surgeon: Valarie Merino, MD;  Location: [facility];  Service: General;  Laterality: N/A;  Re-do Laparoscopic Nissen    SEPTOPLASTY     TONSILLECTOMY  05-09-11   Tonsillectomy   UMBILICAL HERNIA REPAIR  05/16/2011   Procedure: HERNIA REPAIR UMBILICAL ADULT;  Surgeon: Valarie Merino, MD;  Location: [facility];  Service: General;  Laterality: N/A;  with mesh    UPPER GASTROINTESTINAL ENDOSCOPY     VAGINAL HYSTERECTOMY      Current Outpatient Medications  Medication Sig Dispense Refill   aspirin EC 81 MG tablet Take 81 mg by mouth daily.     Cholecalciferol (VITAMIN D3) 5000 UNITS CAPS Take 5,000 Units by mouth once a week.      conjugated estrogens (PREMARIN) vaginal cream Apply one pea-sized amount around the opening of the urethra daily for 2 weeks, then  3 times weekly moving forward. 30 g 4   Cranberry-Vitamin C 84-20 MG CAPS Take 2 capsules by mouth daily.     Cyanocobalamin (B-12 PO) Take by mouth.     carbidopa-levodopa (SINEMET IR) 25-100 MG tablet 1-2 TABLET IN THE MORNING, MAY ALSO TAKE 1/2 TO 1 TABLET AT MIDDAY AND 1-2 IN THE EVENING before dinner. -Try to separate Sinemet from food (especially protein-rich foods like meat, dairy, eggs) by about 30-60 mins - this will help the absorption of the medication. If you have some nausea with the medication, you can take it with some light food like crackers or ginger ale 450 tablet 4   ondansetron (ZOFRAN-ODT) 4 MG disintegrating tablet Take 1 tablet (4 mg total) by mouth every 8 (eight) hours as needed for nausea or vomiting. 30 tablet 0   No current facility-administered medications for this visit.    Allergies as of 08/30/2022 - Review Complete 08/30/2022  Allergen Reaction Noted   Benzocaine Palpitations 05/24/2015   Epinephrine hcl (nasal) Palpitations and Other (See Comments) 02/21/2015   Other Other (See Comments) and Palpitations 02/21/2015   Amoxicillin-pot clavulanate Diarrhea and Nausea And Vomiting 11/22/2021   Cefuroxime axetil Nausea Only 04/28/2021   Nitrofurantoin Nausea And Vomiting 11/22/2021   Omeprazole Diarrhea  03/26/2018   Sucralfate Nausea Only 11/22/2021   Trimethoprim Diarrhea and Nausea And Vomiting 11/22/2021   Sulfa antibiotics Swelling and Rash 02/22/2011    Vitals: BP 108/70   Pulse 74   Ht 5' 5.5" (1.664 m)   Wt 138 lb 12.8 oz (63 kg)   BMI 22.75 kg/m  Last Weight:  Wt Readings from Last 1 Encounters:  08/30/22 138 lb 12.8 oz (63 kg)   Last Height:   Ht Readings from Last 1 Encounters:  08/30/22 5' 5.5" (1.664 m)    Patient is in no acute distress, conversant, pleasant, speech is normal and fluent, she has slightly impaired comprehension but is oriented to person place and time, pupils are equally round and reactive to light, mild masked facies,  visual fields are full to threat, extraocular movements are intact, face is symmetric, hearing intact voice is normal, she has a resting tremor in the left hand, she has a shuffling gait, left arm swing decreased with reemergent tremor, cogwheeling upper extremities, stooped slightly, strength is antigravity and equal nothing focal seen, intact to light touch,   Physical exam: STABLE Exam:  Assessment/Plan: Lovely patient 82 year old here for follow up of Parkinson;s disease here with her caretaker Misty Stanley, Patient was seen in the past for thalamic stroke and tremor, we did note she had some mild parkinsonian symptoms  in 2019, we discussed likely early Parkinson's disease and further testing and a DaTscan at that time she agreed to the DaTscan but then changed her mind, since I last saw her in 2019 she progressed and now she ihas been diagnosed for a few years with PD, has seen the movement team at [facility], is on Sinemet and reports improvement.DAT scan consistent with parkinsonian disorder  - She had an initial appointment at [facility] Disorder team, Movement Disorder Specialist MS at [facility] diagnosed with likely idiopathic parkinson's Disease. I recommend she stay associated with them for her care, they are the best in the area, but we can see her in the meantime until needed to go back. she felt PT was a waste of time.   -  She has low BP bu asymptomatic  - She declined going to parkinson's Child psychotherapist at [facility] neurology, discussed again with husband, they decline  - Continue Sinemet:   - Per [place]: She did try compression hose and they were very challenging to get on. It felt like it was cutting her circulation off. She was custom-fitted for them. She doesn't think she was able to wear them long enough to know it was helpful for BP. She has an abdominal wrap that she used after her back surgery but not for the purpose of OH. I also encouraged her to use the stockings and  return to PT and stay active.   - Get regular skin checks, PD associated with skn cancer, discussed again  - She does not drive, discussed  - Stopped phenergan may make symptoms worse, use zofran sparingly  - Fall risk, discussed fall precautions, she has not fallen  - provided much Parkinson's information again, local groups, resources which we reviewed extensively and I answered all questions at prior appointments, I reviewed classes focused on PD and exercise, support groups, I encouraged they attend, exercise can slow down PD progression  - Physical Therapy: For Parkinson's disease, gait abnormality she went a few times and then decided to do the exercises at home, she did not return.   - Suggested In-home PT  again today SHE DECLINED  - she declined MMSE at this visit again, denies memory loss, will try again next visit and maybe discuss [employer]   - Had family meeting in the past with husband, they both appeared to have poor memories on her history and how we diagnosed PD (Also saw Dr. Sherryll Burger at [facility] who dxed PD as did [facility])  Lacunar infarct: I had a long d/w patient about her stroke, risk for recurrent stroke/TIAs, personally independently reviewed imaging studies and stroke evaluation results and answered questions.Continue [id number] for secondary stroke prevention and maintain strict control of hypertension with blood pressure goal below 130/90, diabetes with hemoglobin A1c goal below 6.5% and lipids with LDL cholesterol goal below 70 mg/dL. I also advised the patient to eat a healthy diet with plenty of whole grains, cereals, fruits and vegetables, exercise regularly and maintain ideal body weight .  Patient's LDL is elevated, she declines cholesterol medication, encouraged getting LDL less than 70.  Meds ordered this encounter  Medications   carbidopa-levodopa (SINEMET IR) 25-100 MG tablet    Sig: 1-2 TABLET IN THE MORNING, MAY ALSO TAKE 1/2 TO 1 TABLET AT MIDDAY AND 1-2  IN THE EVENING before dinner. -Try to separate Sinemet from food (especially protein-rich foods like meat, dairy, eggs) by about 30-60 mins - this will help the absorption of the medication. If you have some nausea with the medication, you can take it with some light food like crackers or ginger ale    Dispense:  450 tablet    Refill:  4   ondansetron (ZOFRAN-ODT) 4 MG disintegrating tablet    Sig: Take 1 tablet (4 mg total) by mouth every 8 (eight) hours as needed for nausea or vomiting.    Dispense:  30 tablet    Refill:  0    Naomie Dean, MD  [facility] [address] [place], [place] [address]  I spent over 40 minutes of face-to-face and non-face-to-face time with patient on the  1. Idiopathic Parkinson's disease   2. Lacunar stroke     diagnosis.  This included previsit chart review, lab review, study review, order entry, electronic health record documentation, patient education on the different diagnostic and therapeutic options, counseling and coordination of care, risks and benefits of management, compliance, or risk factor reduction

## 2022-08-30 NOTE — Patient Instructions (Signed)
Meds ordered this encounter  Medications   carbidopa-levodopa (SINEMET IR) 25-100 MG tablet    Sig: 1-2 TABLET IN THE MORNING, MAY ALSO TAKE 1/2 TO 1 TABLET AT MIDDAY AND 1-2 IN THE EVENING before dinner. -Try to separate Sinemet from food (especially protein-rich foods like meat, dairy, eggs) by about 30-60 mins - this will help the absorption of the medication. If you have some nausea with the medication, you can take it with some light food like crackers or ginger ale    Dispense:  450 tablet    Refill:  4   ondansetron (ZOFRAN-ODT) 4 MG disintegrating tablet    Sig: Take 1 tablet (4 mg total) by mouth every 8 (eight) hours as needed for nausea or vomiting.    Dispense:  30 tablet    Refill:  0    Parkinson's Disease Parkinson's disease is a movement disorder. It is a long-term condition that gets worse over time. Each person with Parkinson's disease is affected differently. This condition limits a person's ability to control movements and move the body normally. The condition can range from mild to severe. Parkinson's disease tends to get worse slowly over several years. What are the causes? Parkinson's disease is caused by a loss of brain cells (neurons) that make a brain chemical called dopamine. Dopamine is needed to control movement. As the condition gets worse, more neurons that make dopamine die. This makes it hard to move or control your movements. The exact cause of the loss of neurons is not known. Genes and the environment may contribute to the cause of Parkinson's disease. What increases the risk? The following factors may make you more likely to develop this condition: Being female. Being age 67 or older. Having a family history of Parkinson's disease. Having had a traumatic brain injury. Having been exposed to toxins, such as pesticides. Having depression. What are the signs or symptoms? Symptoms of this condition can vary. The main symptoms are related to movement. These  include: A tremor or shaking while you are resting. You cannot control the shaking. Stiffness in your arms and legs (rigidity). Slowing of movement. You may lose facial expressions and have trouble making small movements that are needed to button clothing or brush your teeth. An abnormal walk. You may walk with short, shuffling steps. Loss of balance and stability when standing. You may sway, fall backward, and have trouble making turns. Other symptoms include: Mental or cognitive changes, including: Depression or anxiety. Having false beliefs (delusions). Seeing, hearing, or feeling things that do not exist (hallucinations). Trouble speaking or swallowing. Changes in bowel or bladder functions, including constipation, having to go urgently or frequently, or not being able to control your bowel or bladder. Changes in sleep habits, acting out dreams, or trouble sleeping. Depending on the severity of the symptoms, Parkinson's disease may be mild, moderate, or advanced. Parkinson's disease progression is different for everyone. Some people may not progress to the advanced stage. Mild Parkinson's disease involves: Movement problems that do not affect daily activities. Movement problems on one side of the body. Moderate Parkinson's disease involves: Movement problems on both sides of the body. Slowing of movement. Coordination and balance problems. Advanced Parkinson's disease involves: Extreme difficulty walking. Inability to live alone safely. Signs of dementia, such as having trouble remembering things, doing daily tasks such as getting dressed, and problem solving. How is this diagnosed? This condition is diagnosed by a specialist. A diagnosis may be made based on symptoms, your medical history,  and a physical exam. You may also have brain imaging tests to check for loss of neurons in the brain. How is this treated? There is no cure for Parkinson's disease. Treatment focuses on managing  your symptoms. Treatment may include: Medicines. Everyone responds to medicines differently. Your response may change over time. Work with your health care provider to find the best medicines for you. Speech, occupational, and physical therapy. Deep brain stimulation surgery to reduce tremors and other involuntary movements. Follow these instructions at home: Medicines Take over-the-counter and prescription medicines only as told by your health care provider. Avoid taking medicines that can affect thinking, such as pain or sleeping medicines. Eating and drinking Follow instructions from your health care provider about eating or drinking restrictions. Do not drink alcohol. Activity Ask your health care provider if it is safe for you to drive. Do exercises as told by your health care provider or physical therapist. Lifestyle  Install grab bars and railings in your home to prevent falls. Do not use any products that contain nicotine or tobacco. These products include cigarettes, chewing tobacco, and vaping devices, such as e-cigarettes. If you need help quitting, ask your health care provider. Consider joining a support group for people with Parkinson's disease. General instructions Work with your health care provider to know the kind of day-to-day help that you may need and what to do to stay safe. Keep all follow-up visits. This is important. Follow-up visits include any visits with a physical therapist, speech therapist, or occupational therapist. Where to find more information [employer]: [employer] Parkinson's Foundation: www.parkinson.org Contact a health care provider if: Medicines do not help your symptoms. You are unsteady or have fallen at home. You need more support to function well at home. You have trouble swallowing. You have severe constipation. You are having problems with side effects from your medicines. You feel  confused, anxious, depressed, or have hallucinations. Get help right away if you: Are injured after a fall. Cannot swallow without choking. Have chest pain or trouble breathing. Do not feel safe at home. Have thoughts about hurting yourself or others. These symptoms may represent a serious problem that is an emergency. Do not wait to see if the symptoms will go away. Get medical help right away. Call your local emergency services (911 in the [place] Do not drive yourself to the hospital. If you ever feel like you may hurt yourself or others, or have thoughts about taking your own life, get help right away. Go to your nearest emergency department or: Call your local emergency services (911 in the [place] Call a suicide crisis helpline, such as the National Suicide Prevention Lifeline at 8055354036 or 988 in the [place] This is open 24 hours a day in the [place] Text the Crisis Text Line at 929-595-6447 (in the [place] Summary Parkinson's disease is a long-term condition that gets worse over time. This condition limits your ability to control your movements and move your body normally. There is no cure for Parkinson's disease. Treatment focuses on managing your symptoms. Work with your health care provider to know the kind of day-to-day help that you may need and what to do to stay safe. Keep all follow-up visits, including any visits with a physical therapist, speech therapist, or occupational therapist. This is important. This information is not intended to replace advice given to you by your health care provider. Make sure you discuss any questions you have with your health care provider. Document  Revised: 09/29/2020 Document Reviewed: 06/21/2020 Elsevier Patient Education  2024 Elsevier Inc.  Carbidopa; Levodopa Extended-Release Tablets What is this medication? CARBIDOPA; LEVODOPA (kar bi DOE pa; lee voe DOE pa) treats the symptoms of Parkinson disease. It works by increasing the amount of dopamine in  your brain, a substance which helps manage body movements and coordination. This reduces the symptoms of Parkinson, such as body stiffness and tremors. This medicine may be used for other purposes; ask your health care provider or pharmacist if you have questions. COMMON BRAND NAME(S): SINEMET, SINEMET CR What should I tell my care team before I take this medication? They need to know if you have any of these conditions: Depression or other mental health conditions Diabetes Glaucoma Heart disease, including history of a heart attack History of irregular heartbeat Kidney disease Liver disease Lung or breathing disease, such as asthma Narcolepsy Sleep apnea Stomach or intestine problems An unusual or allergic reaction to levodopa, carbidopa, other medications, foods, dyes, or preservatives Pregnant or trying to get pregnant Breastfeeding How should I use this medication? Take this medication by mouth with a glass of water. Follow the directions on the prescription label. Swallow whole. Do not crush or chew. You may cut the tablets in half. Take your doses at regular intervals. Do not take your medication more often than directed. Do not stop taking except on the advice of your care team. Talk to your care team about the use of this medication in children. Special care may be needed. Overdosage: If you think you have taken too much of this medicine contact a poison control center or emergency room at once. NOTE: This medicine is only for you. Do not share this medicine with others. What if I miss a dose? If you miss a dose, take it as soon as you can. If it is almost time for your next dose, take only that dose. Do not take double or extra doses. What may interact with this medication? Do not take this medication with any of the following: MAOIs, such as Marplan, Nardil, and Parnate Reserpine Tetrabenazine This medication may also interact with the  following: Alcohol Droperidol Entacapone Iron supplements or multivitamins with iron Isoniazid, INH Linezolid Medications for blood pressure Medications for mental health conditions Medications that help you fall asleep Metoclopramide Papaverine Procarbazine Tedizolid Rasagiline Selegiline Tolcapone This list may not describe all possible interactions. Give your health care provider a list of all the medicines, herbs, non-prescription drugs, or dietary supplements you use. Also tell them if you smoke, drink alcohol, or use illegal drugs. Some items may interact with your medicine. What should I watch for while using this medication? Visit your care team for regular checks on your progress. Tell your care team if your symptoms do not start to get better or if they get worse. A severe reaction similar to neuroleptic malignant syndrome (NMS) may occur if you reduce the dose of or stop taking this medication too quickly. Symptoms of NMS include high fever, stiff muscles, increased sweating, fast or irregular heartbeat, and confusion. Contact your care team right away if think you have NMS. This medication may affect your coordination, reaction time, or judgment. Do not drive or operate machinery until you know how this medication affects you. Sit up or stand slowly to reduce the risk of dizzy or fainting spells. Drinking alcohol with this medication can increase the risk of these side effects. When taking this medication, you may fall asleep without notice. You may be doing  activities, such as driving a car, talking, or eating. You may not feel drowsy before it happens. Contact your care team right away if this happens to you. There have been reports of increased sexual urges or other strong urges, such as gambling while taking this medication. If you experience any of these while taking this medication, you should report this to your care team as soon as possible. You may experience a 'wearing  off' effect before it is time to take your next dose of this medication. You may also experience an 'on-off' effect where the medication seems to stop working for minutes to hours, then suddenly starts working again. Tell your care team if this happens to you. Your dose may need to be adjusted. Eating high protein foods may affect how this medication works. Tell your care team if you change your diet. If you have diabetes, you may get a false-positive result for sugar in your urine. Check with your care team. This medication can make your saliva, sweat, or urine look dark red or black. This is normal but may stain clothing or fabrics. This medication may cause low levels of vitamin B6 in your body. Make sure that you get enough vitamin B6 while you are taking this medication. Discuss the foods you eat and the vitamins you take with your care team. What side effects may I notice from receiving this medication? Side effects that you should report to your care team as soon as possible: Allergic reactions--skin rash, itching, hives, swelling of the face, lips, tongue, or throat Falling asleep during daily activities Heart rhythm changes--fast or irregular heartbeat, dizziness, feeling faint or lightheaded, chest pain, trouble breathing Low blood pressure--dizziness, feeling faint or lightheaded, blurry vision Mood and behavior changes--anxiety, nervousness, confusion, hallucinations, irritability, hostility, thoughts of suicide or self-harm, worsening mood, feelings of depression New or worsening uncontrolled and repetitive movements of the face, mouth, or upper body Stomach bleeding--bloody or black, tar-like stools, vomiting blood or brown material that looks like coffee grounds Sudden eye pain or change in vision such as blurry vision, seeing halos around lights, vision loss Urges to engage in impulsive behaviors such as gambling, binge eating, sexual activity, or shopping in ways that are unusual for  you Side effects that usually do not require medical attention (report to your care team if they continue or are bothersome): Dark red or black saliva, sweat, or urine Dizziness Drowsiness Headache Nausea This list may not describe all possible side effects. Call your doctor for medical advice about side effects. You may report side effects to FDA at 1-800-FDA-1088. Where should I keep my medication? Keep out of the reach of children. Store below 30 degrees C (86 degrees F). Keep container tightly closed. Throw away any unused medication after the expiration date. NOTE: This sheet is a summary. It may not cover all possible information. If you have questions about this medicine, talk to your doctor, pharmacist, or health care provider.  2024 Elsevier/Gold Standard (2021-09-29 00:00:00)

## 2022-09-04 ENCOUNTER — Other Ambulatory Visit: Payer: Self-pay | Admitting: Neurology

## 2022-09-08 ENCOUNTER — Other Ambulatory Visit (HOSPITAL_COMMUNITY): Payer: Self-pay

## 2022-09-08 ENCOUNTER — Telehealth: Payer: Self-pay

## 2022-09-08 NOTE — Telephone Encounter (Signed)
Pharmacy Patient Advocate Encounter   Received notification from [facility] that prior authorization for Ondansetron 4MG  dispersible tablets is required/requested.   PA submitted to [employer]/ [facility] via CoverMyMeds Key or (Medicaid) confirmation # [id number] Status is pending

## 2022-09-12 DIAGNOSIS — H35342 Macular cyst, hole, or pseudohole, left eye: Secondary | ICD-10-CM | POA: Diagnosis not present

## 2022-09-12 DIAGNOSIS — H2513 Age-related nuclear cataract, bilateral: Secondary | ICD-10-CM | POA: Diagnosis not present

## 2022-09-12 DIAGNOSIS — H25013 Cortical age-related cataract, bilateral: Secondary | ICD-10-CM | POA: Diagnosis not present

## 2022-09-12 DIAGNOSIS — H0288A Meibomian gland dysfunction right eye, upper and lower eyelids: Secondary | ICD-10-CM | POA: Diagnosis not present

## 2022-09-13 NOTE — Telephone Encounter (Signed)
Pharmacy Patient Advocate Encounter  Received notification from [facility] that the request for prior authorization for Ondansetron 4MG  ODT  has been denied due to see below.      Please be advised we currently do not have a Pharmacist to review denials, therefore you will need to process appeals accordingly as needed. Thanks for your support at this time.   You may call [phone] or fax [phone], to appeal.  The denial letter has been placed in the chart under the media tab.

## 2022-09-13 NOTE — Telephone Encounter (Signed)
Pt can get this with a good rx coupon for about $20. I tried to call her but couldn't reach. I sent her a MyChart message.

## 2022-09-18 DIAGNOSIS — H43813 Vitreous degeneration, bilateral: Secondary | ICD-10-CM | POA: Diagnosis not present

## 2022-09-18 DIAGNOSIS — H35373 Puckering of macula, bilateral: Secondary | ICD-10-CM | POA: Diagnosis not present

## 2022-09-25 ENCOUNTER — Telehealth: Payer: Self-pay | Admitting: Neurology

## 2022-09-25 IMAGING — CR DG CHEST 2V
2 series · 2 of 2 positions shown · non-contrast
Comparison: 03/04/2017.

CLINICAL DATA: fatigue, weakness, unclear cause

EXAM:
CHEST - 2 VIEW

[chest lat]
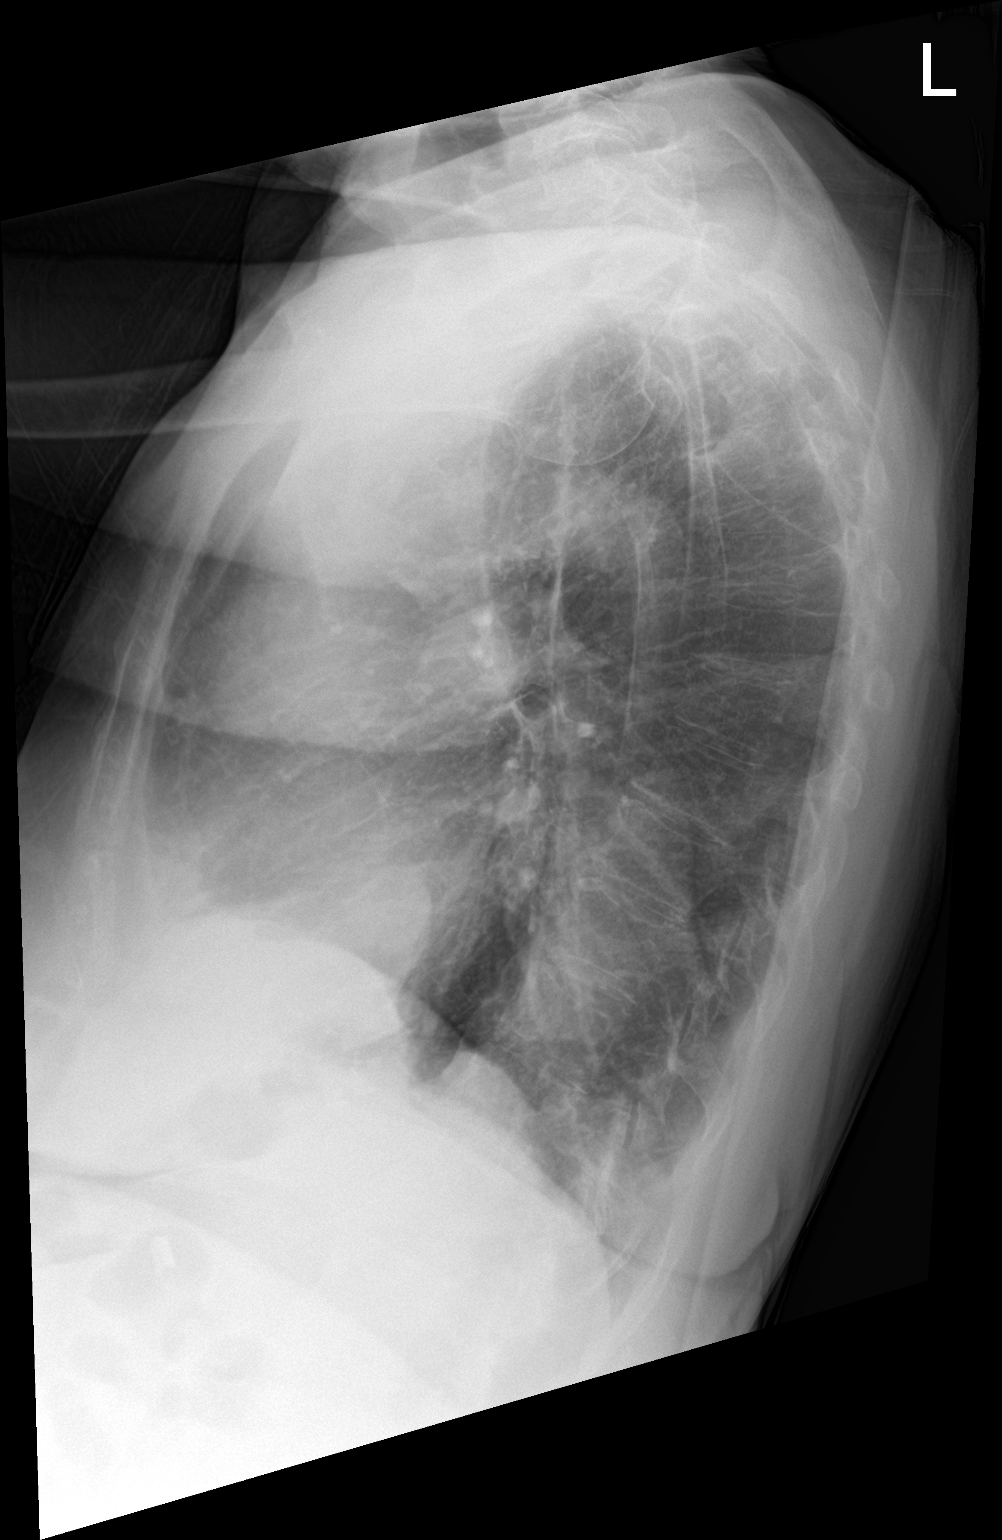

[chest ap]
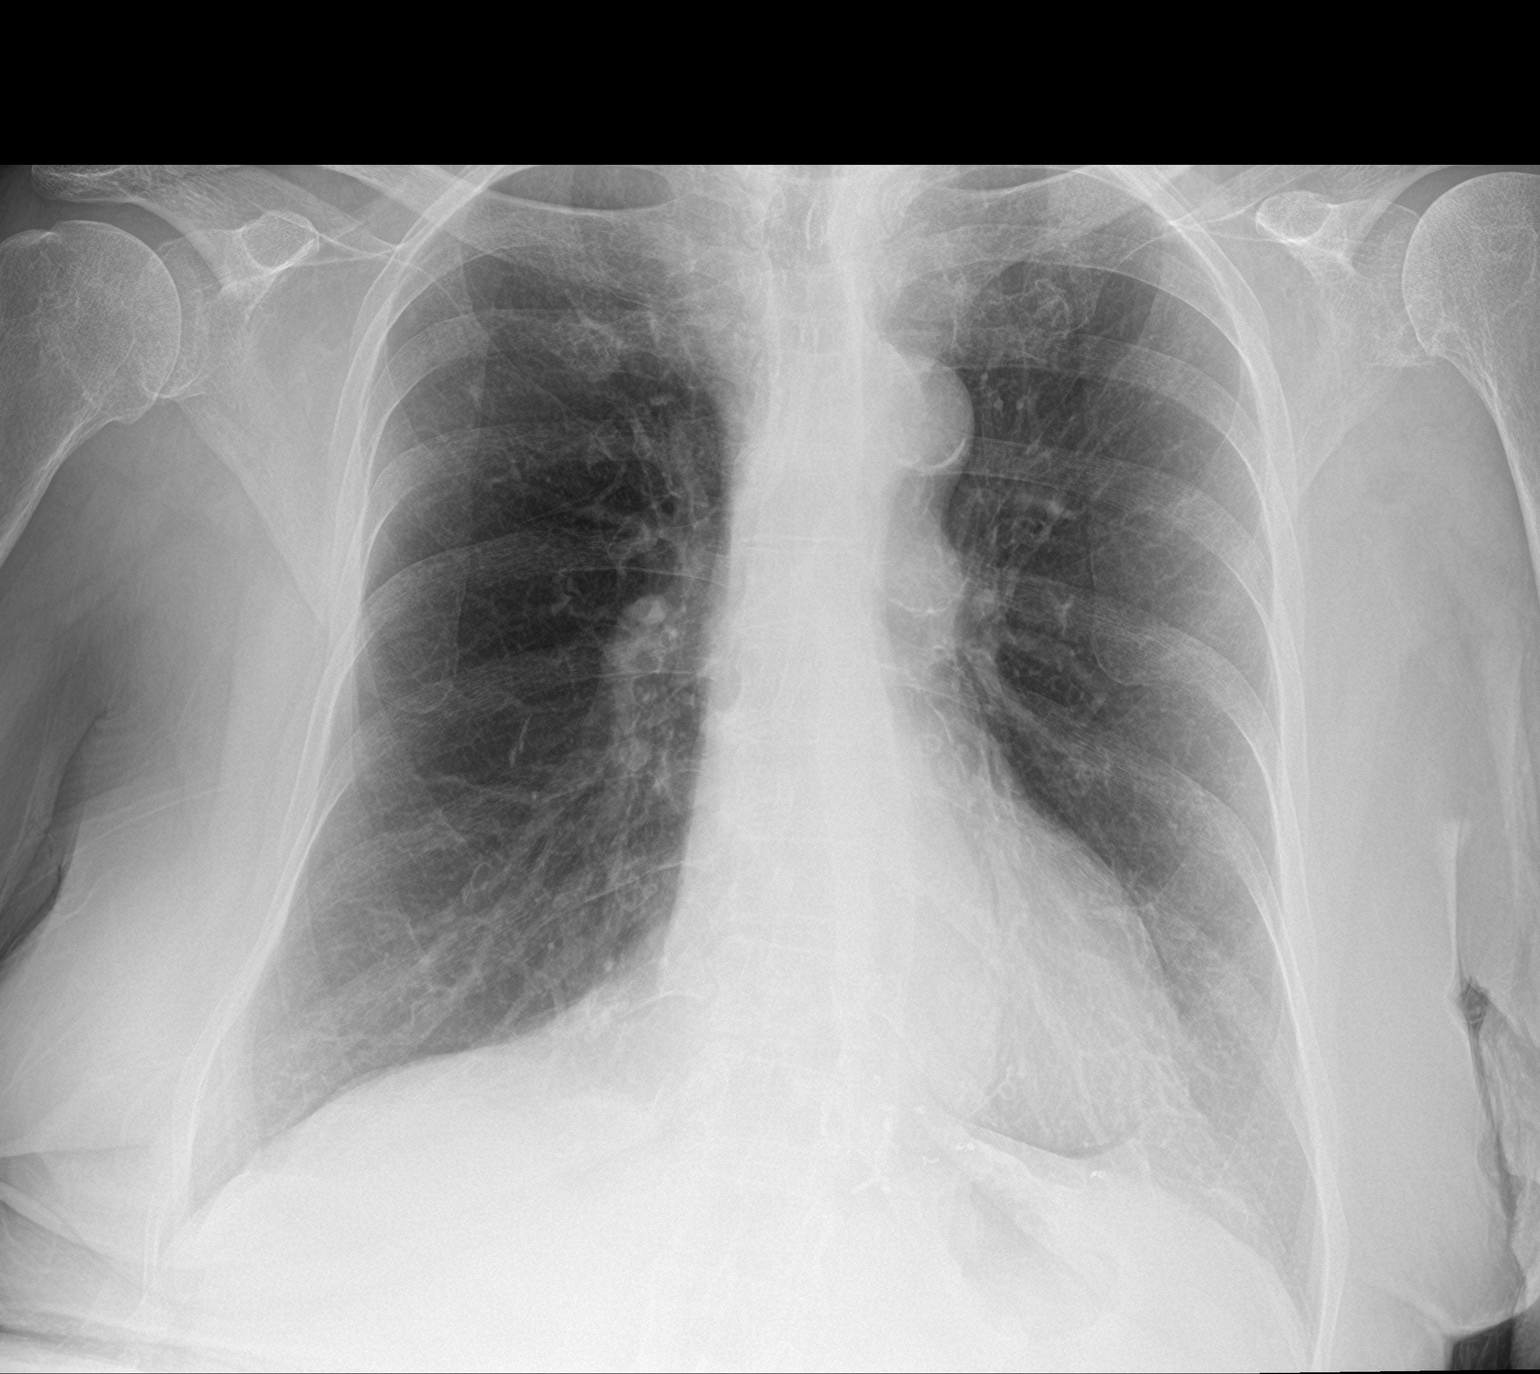

[2 of 2 positions shown; findings below may reference images not displayed]

FINDINGS: Chronic streaky left basilar opacities. No new consolidation. No
visible pleural effusions or pneumothorax. Cardiomediastinal
silhouette is within normal limits and similar to prior. No evidence
of acute osseous abnormality. Osteopenia.
IMPRESSION: Chronic streaky left basilar opacities, favor atelectasis/scar.

## 2022-09-25 IMAGING — CT CT HEAD W/O CM
4 series · 16 of 47 positions shown, 18 images · non-contrast
Comparison: 12/12/2020

CLINICAL DATA: Motor neuron disease suspected. Lower leg weakness.
History of Parkinson's.

EXAM:
CT HEAD WITHOUT CONTRAST
TECHNIQUE: Contiguous axial images were obtained from the base of the skull
through the vertex without intravenous contrast.

[Series 2: head bone · axial · 0.43mm/px · z∈[-98,-66]mm · 3 of 78 slices shown]
[im 8/78  bone]
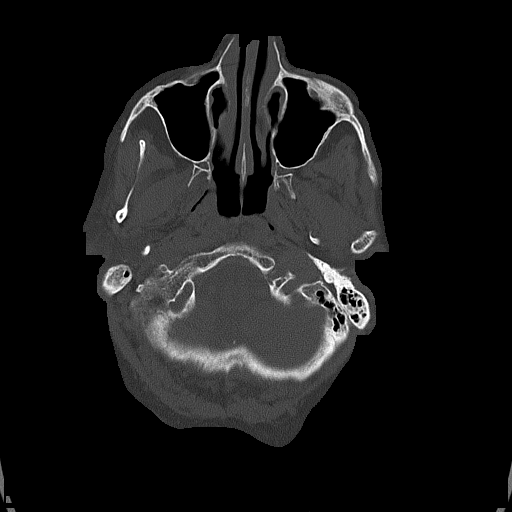
[im 16/78  bone]
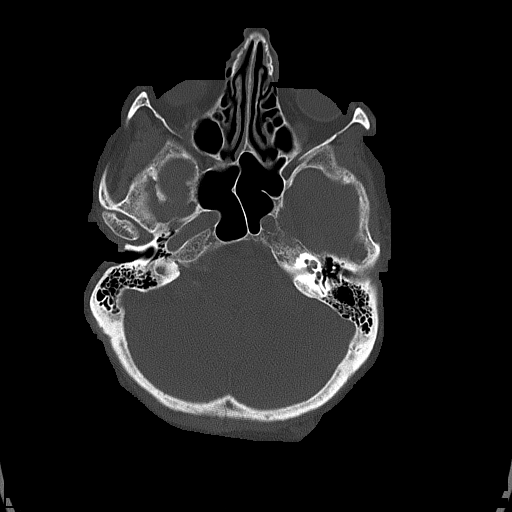
[im 24/78  bone]
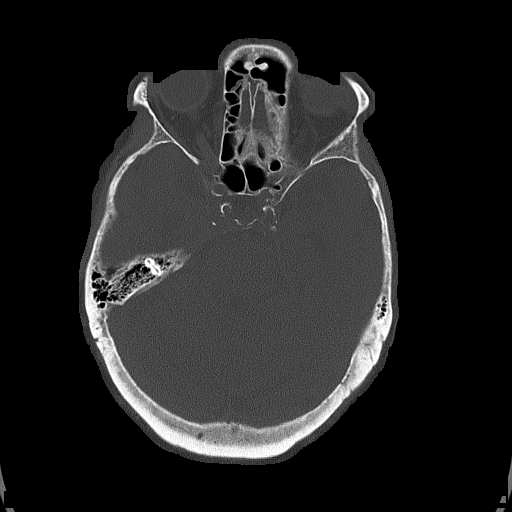

[Series 3: head wo · axial · 0.43mm/px · z∈[-97,+23]mm · 7 of 32 slices shown, 9 images]
[im 4/32  brain]
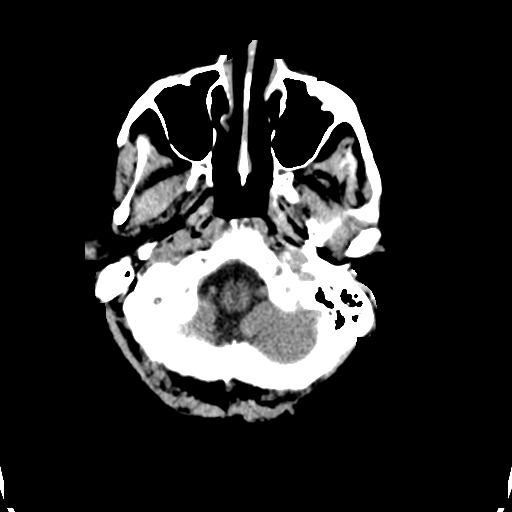
[im 4/32  bone]
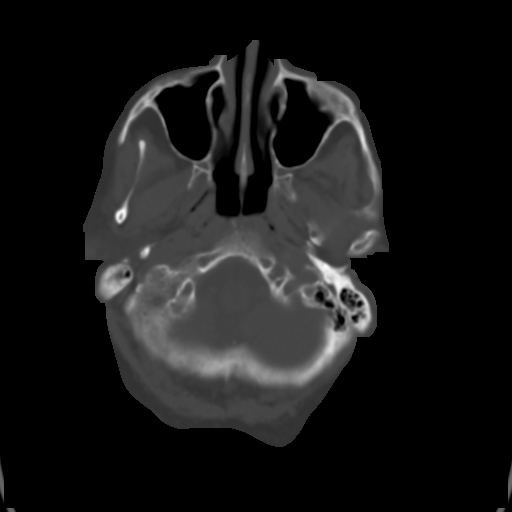
[im 8/32  brain]
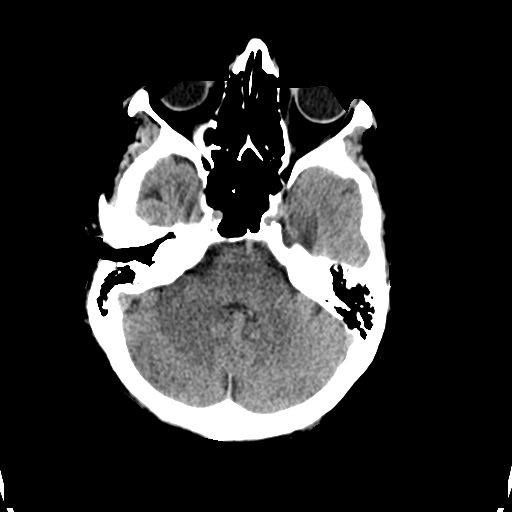
[im 12/32  brain]
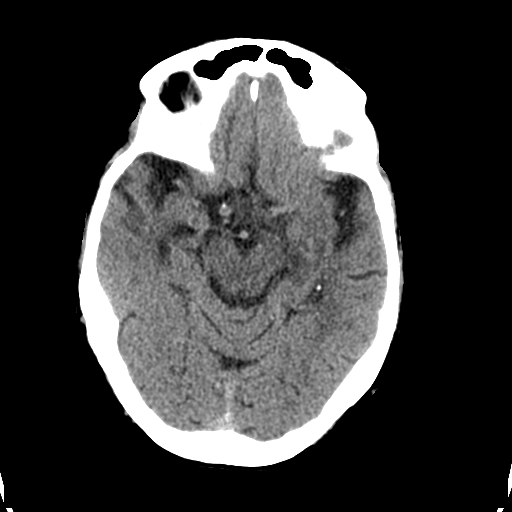
[im 16/32  brain]
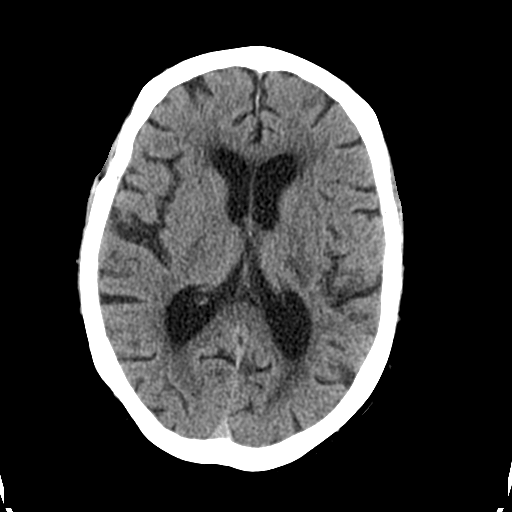
[im 20/32  brain]
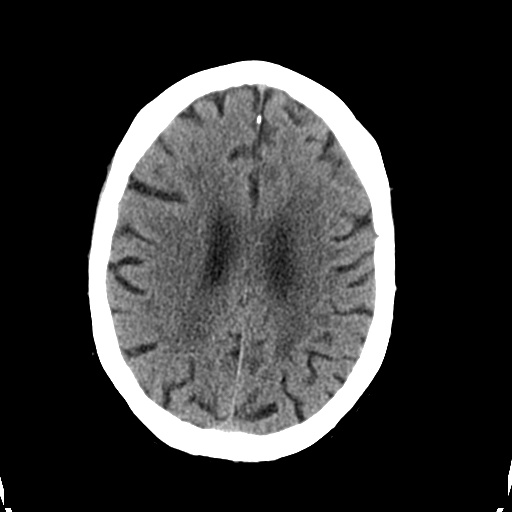
[im 20/32  bone]
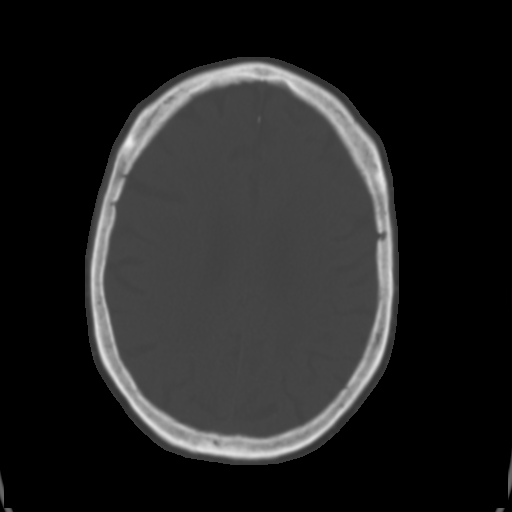
[im 24/32  brain]
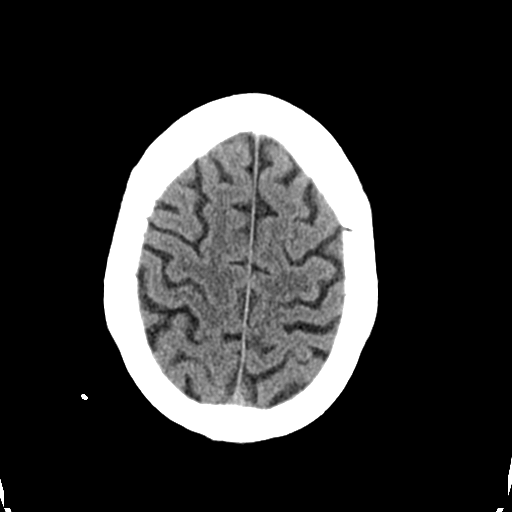
[im 28/32  brain]
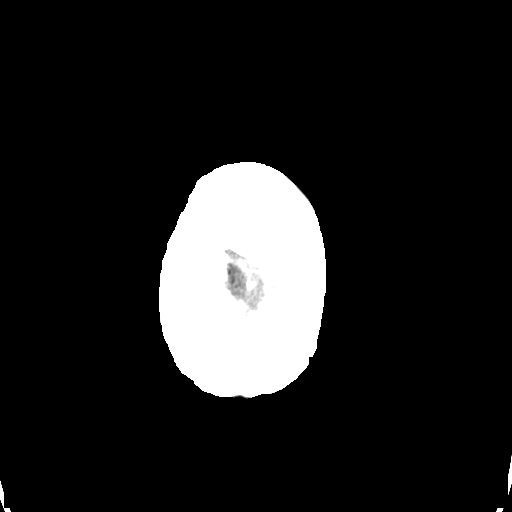

[Series 4: coronal soft tissue · coronal · 0.30mm/px · 3 of 68 slices shown]
[im 23/68  brain]
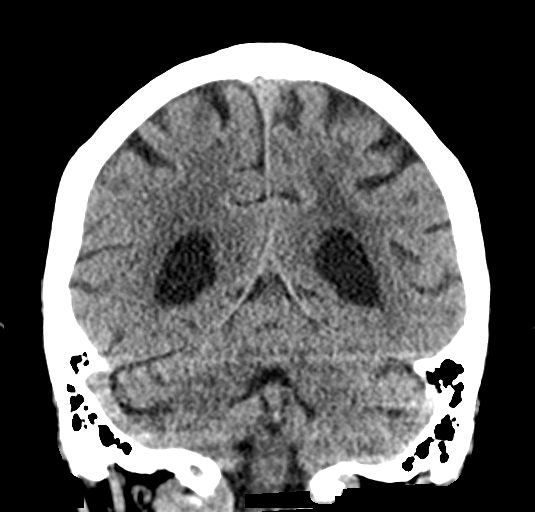
[im 30/68  brain]
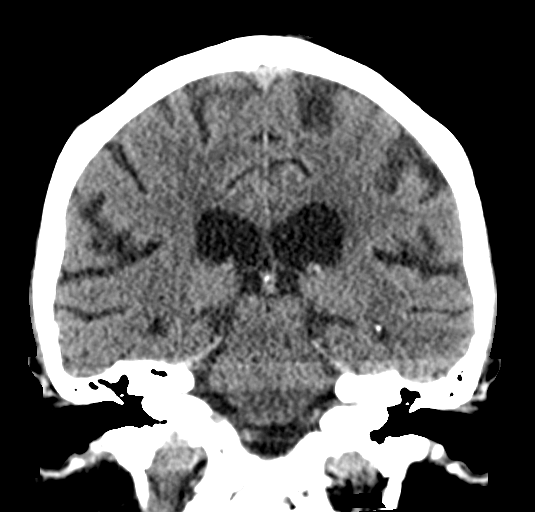
[im 38/68  brain]
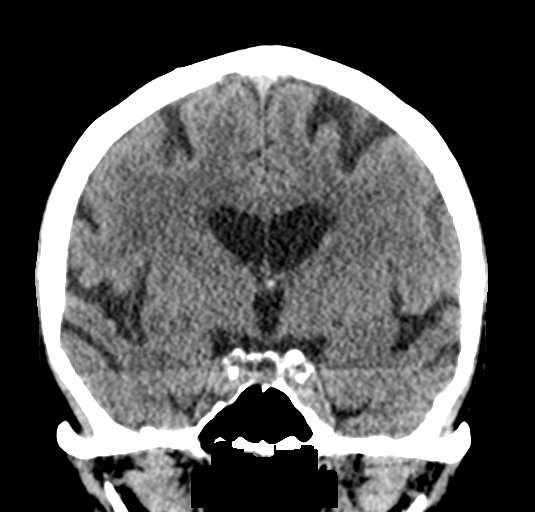

[Series 5: sagittal soft tissue · sagittal · 0.30mm/px · 3 of 55 slices shown]
[im 19/55  brain]
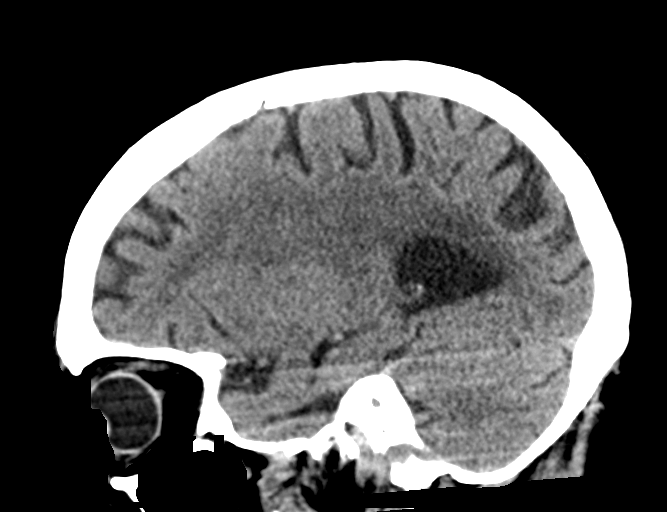
[im 28/55  brain]
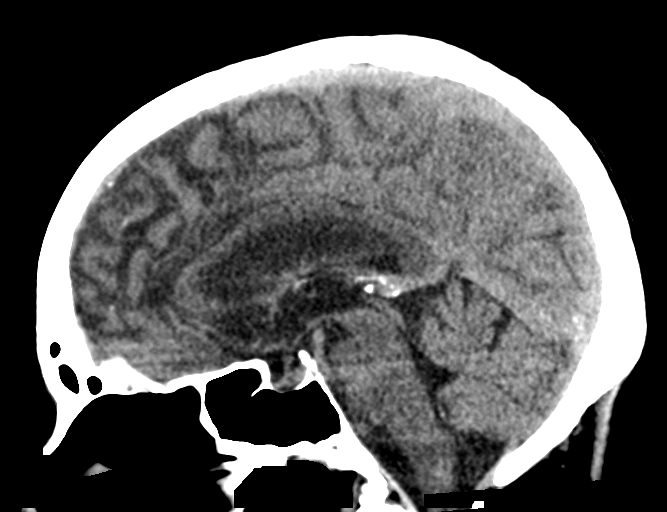
[im 37/55  brain]
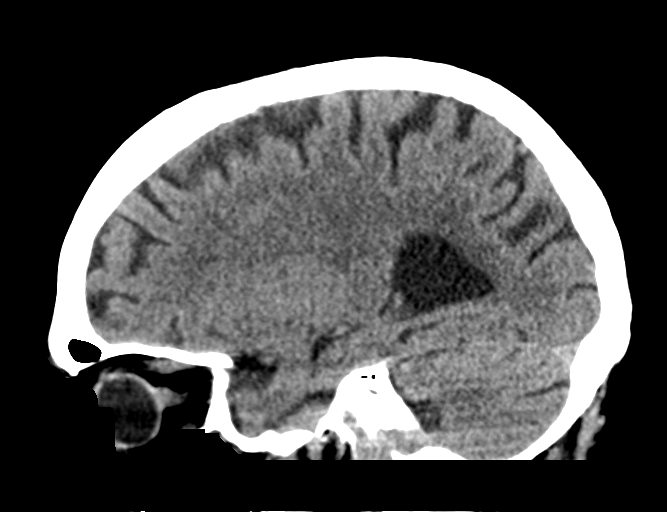

[16 of 47 positions shown; findings below may reference images not displayed]

FINDINGS: Brain: Diffuse cerebral atrophy. Ventricular dilatation consistent
with central atrophy. Low-attenuation changes in the deep white
matter consistent with small vessel ischemia. No abnormal
extra-axial fluid collections. No mass effect or midline shift.
Gray-white matter junctions are distinct. Basal cisterns are not
effaced. No acute intracranial hemorrhage.

Vascular: Moderate intracranial arterial vascular calcifications.

Skull: Calvarium appears intact.

Sinuses/Orbits: Paranasal sinuses and mastoid air cells are clear.

Other: None.
IMPRESSION: No acute intracranial abnormalities. Mild chronic atrophy and small
vessel ischemic changes, similar to prior study.

## 2022-09-25 NOTE — Telephone Encounter (Signed)
Pt stated she is taking carbidopa-levodopa (SINEMET IR) 25-100 MG table and over the weekend it stopped working. Stated her hands are having tremors. Pt wants to know if Dr. Lucia Gaskins can increase medication to help with tremors.

## 2022-09-25 NOTE — Telephone Encounter (Signed)
Contacted pt back, she stated last week her tremors started to increase to the point her tremors wake her up at night. She is taking Carbidopa-Levodopa IR 25-100MG  2 tab around 10am, 1 tab around 1/2 pm and 2 tab around 6/7 pm. The dosages will last her about 4 hrs then her tremors return. She does not take it with meals, if anything, she may take it with crackers or something light. Do you advise she alter the times she is taking the medication? Please advise

## 2022-09-26 ENCOUNTER — Encounter: Payer: Self-pay | Admitting: Neurology

## 2022-09-26 NOTE — Telephone Encounter (Signed)
I just saw her on 08/30/2022 and she said she was "doing amazing" so I am not quite sure how sinemet just "stops working". Unfortunately I can't just adjust the meds, something doesn't seem right. I mean she literally said less than a month ago she was "doing amazing" . Is someone watching her take her meds? she had a caretaker Misty Stanley with her last time, is Misty Stanley helping her? Do we have any numbers on DPR we can call to see what may be going on like is she forgetting to take the meds or taking the wrong meds? Please see if there is a friend or caretaker we can call, last time she was here I do believe the caretaker was saying she forgets her meds. If you find someone on the DPR to call would make inquiries?  Otherwise Megan and I have both seen her in the past, can either of [place] squeeze her in sometime soon? Have her bring her meds with her so we can see what is going on?  If it is kust the tremors waking her up overnight we could add a CR dose at bedtime but her last message said the sinemet "stopped working" over the weekend. I'll speak with you all in the morning and we can make a plan to call or get her in here thanks

## 2022-09-26 NOTE — Telephone Encounter (Signed)
Pt called back wanting to know if RN was able to speak to the provider. Pt was informed that there is a 24-48hr allotted time to get back with the pt and that the RN will get back to her once she has an answer.

## 2022-09-27 MED ORDER — CARBIDOPA-LEVODOPA ER 50-200 MG PO TBCR: 1.0000 | EXTENDED_RELEASE_TABLET | Freq: Every day | ORAL | 3 refills | Status: DC

## 2022-09-27 NOTE — Telephone Encounter (Signed)
Addressed in TE 7/8

## 2022-09-27 NOTE — Telephone Encounter (Signed)
Contacted pt back, informed her I verbally spoke with MD this morning. She would like to add Sinemet CR 50/200 1 dose nightly for her to take right before bed. This should carry her through the night and help her with the tremor she has that keeps her up at night. She verbally understood and agreed to the plan. Order has been placed.

## 2022-09-27 NOTE — Addendum Note (Signed)
Addended by: Jacqualine Code D on: 09/27/2022 10:00 AM   Modules accepted: Orders

## 2022-10-04 NOTE — Telephone Encounter (Signed)
PA has been addressed in separate encounter, please sign off on rx in this encounter as PA team is unable to resolve RX requests. Thank you

## 2022-10-19 DIAGNOSIS — H2513 Age-related nuclear cataract, bilateral: Secondary | ICD-10-CM | POA: Diagnosis not present

## 2022-10-19 DIAGNOSIS — H35373 Puckering of macula, bilateral: Secondary | ICD-10-CM | POA: Diagnosis not present

## 2022-10-19 DIAGNOSIS — H43813 Vitreous degeneration, bilateral: Secondary | ICD-10-CM | POA: Diagnosis not present

## 2022-11-05 DIAGNOSIS — R531 Weakness: Secondary | ICD-10-CM | POA: Diagnosis not present

## 2022-11-05 DIAGNOSIS — R296 Repeated falls: Secondary | ICD-10-CM | POA: Diagnosis not present

## 2022-11-07 DIAGNOSIS — R1310 Dysphagia, unspecified: Secondary | ICD-10-CM | POA: Diagnosis not present

## 2022-11-07 DIAGNOSIS — G20C Parkinsonism, unspecified: Secondary | ICD-10-CM | POA: Diagnosis not present

## 2022-11-07 DIAGNOSIS — R8290 Unspecified abnormal findings in urine: Secondary | ICD-10-CM | POA: Diagnosis not present

## 2022-11-07 DIAGNOSIS — R296 Repeated falls: Secondary | ICD-10-CM | POA: Diagnosis not present

## 2022-11-07 DIAGNOSIS — R531 Weakness: Secondary | ICD-10-CM | POA: Diagnosis not present

## 2022-11-08 DIAGNOSIS — R8290 Unspecified abnormal findings in urine: Secondary | ICD-10-CM | POA: Diagnosis not present

## 2022-11-21 DIAGNOSIS — Z111 Encounter for screening for respiratory tuberculosis: Secondary | ICD-10-CM | POA: Diagnosis not present

## 2022-12-11 DIAGNOSIS — G20A1 Parkinson's disease without dyskinesia, without mention of fluctuations: Secondary | ICD-10-CM | POA: Diagnosis not present

## 2022-12-11 DIAGNOSIS — I2510 Atherosclerotic heart disease of native coronary artery without angina pectoris: Secondary | ICD-10-CM | POA: Diagnosis not present

## 2022-12-11 DIAGNOSIS — R32 Unspecified urinary incontinence: Secondary | ICD-10-CM | POA: Diagnosis not present

## 2022-12-11 DIAGNOSIS — K219 Gastro-esophageal reflux disease without esophagitis: Secondary | ICD-10-CM | POA: Diagnosis not present

## 2022-12-11 DIAGNOSIS — Z556 Problems related to health literacy: Secondary | ICD-10-CM | POA: Diagnosis not present

## 2022-12-11 DIAGNOSIS — I10 Essential (primary) hypertension: Secondary | ICD-10-CM | POA: Diagnosis not present

## 2022-12-11 DIAGNOSIS — Z7982 Long term (current) use of aspirin: Secondary | ICD-10-CM | POA: Diagnosis not present

## 2022-12-13 DIAGNOSIS — G20B1 Parkinson's disease with dyskinesia, without mention of fluctuations: Secondary | ICD-10-CM | POA: Diagnosis not present

## 2022-12-13 DIAGNOSIS — F419 Anxiety disorder, unspecified: Secondary | ICD-10-CM | POA: Diagnosis not present

## 2022-12-13 DIAGNOSIS — K449 Diaphragmatic hernia without obstruction or gangrene: Secondary | ICD-10-CM | POA: Diagnosis not present

## 2022-12-13 DIAGNOSIS — N390 Urinary tract infection, site not specified: Secondary | ICD-10-CM | POA: Diagnosis not present

## 2022-12-13 DIAGNOSIS — R260 Ataxic gait: Secondary | ICD-10-CM | POA: Diagnosis not present

## 2022-12-13 DIAGNOSIS — R1310 Dysphagia, unspecified: Secondary | ICD-10-CM | POA: Diagnosis not present

## 2022-12-18 DIAGNOSIS — F419 Anxiety disorder, unspecified: Secondary | ICD-10-CM | POA: Diagnosis not present

## 2022-12-23 ENCOUNTER — Other Ambulatory Visit: Payer: Self-pay | Admitting: Neurology

## 2022-12-24 NOTE — Progress Notes (Deleted)
PATIENT: Stephanie Bell DOB: 1940-08-07  REASON FOR VISIT: follow up HISTORY FROM: patient No chief complaint on file.     HISTORY OF PRESENT ILLNESS: Today 12/24/22:  08/30/22: She is eating well, drinking well, gained some weight, blood pressure is on the low side today, not symptomatic, no dizziness. Here with caregiver lisa. No falls. "She is doing amazing". She takes 2 sinemet in the morning and 2 ibefore bed. Discussed avoid protein with it. We set her up with [facility] but she didn;t want to go back despite encouragement.  Would recommend taking other 2 tabs at dinner time. Try to separate Sinemet from food (especially protein-rich foods like meat, dairy, eggs) by about 30-60 mins - this will help the absorption of the medication. If you have some nausea with the medication, you can take it with some light food like crackers or ginger ale. She goes to sleep 830pm-9pm. She eats about 5-6 could take pills at 4pm. Overall doing great. She walks around her house, transfers and goes to the bathroom alone. Reminded to get primary care to do skin check due to increased risk of skin cancer. No drooling or problems with secretion. Nose runs. She says she is happy. They stay active. Gets hair done.    Patient complains of symptoms per HPI as well as the following symptoms: none . Pertinent negatives and positives per HPI. All others negative  05/29/22: Stephanie Bell is a 82 y.o. female with a history of Parkinson's disease. Returns today for follow-up. Reports that she went to the ED yesterday. States that she could not move her arms or legs. She called EMS. Got fluids and was discharged. She is now back at her baseline.   Currently taking 1 tablet of sinemet in the AM and at bedtime and 2 tablets at lunch. Tremor responds well to sinemet. Uses a walker. Needs assistance with standing. No longer having any issues chewing or swallowing food.  Has someone in her church that can help her with  physical therapy.Has an aide that comes in several times a week.  Upon chart review after the patient visit had ended the patient has been to see her primary care in January for left leg weakness.  She has seen orthopedist and they have recommended lumbar MRI and based off those results recommended injection.  According to her primary care notes she did have an injection but only helped for several weeks.   02/20/22: Stephanie Bell is an 82 year old female with a history of Parkinson's disease.  She returns today for follow-up.  She adjusted her medication. Currently taking Sinemet 25-100 mg 2 tablet in the morning, 2 tablet at lunch and 2 tablet at bedtime. Feels like that when she increased to this amount it decreased her tremors. Gait is slow. Shuffling more. No falls. Uses a cane. Reports that she had one incident that she got choked with a big tablet and when eating pork. Otherwise she does ok. Doesn't really sleep good due to anxiety.   She lives at home with her husband.  Reports that she is able to complete all ADLs    11/09/21: Stephanie Bell is an 82 year old female with a history of Parkinson's disease.  She returns today for follow-up.  She is here today with her husband.  She reports that she has had UTI frequently over the last 30 years. Reports that whichever ABX she was using the formulary was changed. Her urologist has been working to find another  ABX. Had UTI two weeks again was placed on nitrofurantoin for 7 days. Only took 5 days because she felt nauseous, not eating. She was then prescribed amoxicillin and took it for several days but began having same symptoms. She was then was given sucralfate but this dropped her BP (80/40) and ended in the ER on Friday. Went back on Saturday and got IV fluids. Reports that she is starting to feel better now. She is now only taking her Sinemet. only taking 1 tablet BID wasn't aware she could take 1/2 tab during the day. Appetite is still decreased but making  herself eat. Reports that she is drinking fluids. Able to complete ADLs with some assistance. Sleeping ok.   02/17/21:Stephanie Bell is an 82 year old female with a history of idiopathic Parkinson's disease.  She returns today for follow-up.  She states back in September she did have a fall and broke her wrist.  She had the cast removed today.  She states that she typically takes Sinemet 1 tablet in the morning, half a tablet at lunch and 1 tablet in the evening.  Although she does state that sometimes she does not take the evening dose.  She denies any significant changes in her gait or balance.  She does use a cane when ambulating.  Reports mild tremor in the left hand.  Denies any trouble swallowing or chewing food.  Reports that sometimes she has a hard time sleeping but she is able to nap during the day if needed.  She denies any new symptoms.  Returns today for an evaluation.  HISTORY (copied from Dr. Trevor Mace note) 08/11/2020; DAT Scan was consistent with parkinson's disease. We discussed today. She went to [facility] and diagnosed with likely idiopathic parkinson's disease. She declines PT, I advised her to stay active. Given her orthostatic hypotension, increasing sinemet may pose a problem, Since a dosage increase in the future is most likely inevitable, Sinemet may create challenges per [facility]. Since she was unable to tolerate compression stockings, counseling regarding adequate hydration, abdominal binder, medications such as midodrine or fludricortisone may all be needed and we discussed that today during the appointment. No swallowing difficulties, no falls at home, discussed fall risks. She feels stable. Tremors are not worsening. Not dropping things. We will continue to follow her and if needed in the future she can go back to [facility] if treatment presents a problem for [place] and her.    DAT Scan 06/03/20: reviewed images Asymmetric decreased striatal Ioflupane activity as above. Loss  of activity is greater on the RIGHT than the LEFT. This pattern can be seen in Parkinsonian syndromes   04/08/2020: Here for follow up. Today we discussed her symptoms and reviewed her past, she doesn't feel she has more memory problems than an 79 year should, she has to write down appointments and review schedule daily. We discussed driving and her reflexes may be reduced, she only goes to the beauty shop and places that are close by. She has excellent vision. Her night vision is not as good, I encouraged her not to drive at night and stay just a few miles from home to familiar locations. The Sinemet is helping. We had suggested a DAT scan in the past when her symptoms were milder, today she brings up the DAT Scan and wants to make sure she has PD and not essential tremor and we can order a DAT Scan. No falls. No postural symptoms. She uses a walker at home. She uses  a cane outside the home and this works fine. She has a slight resting tremor ont he left, she states it is only because she is under stress. They declined PT at home for exercise. Not hypophonic. She was having severe orthostatic hypotension and now she can take the Sinemet without a problem. She talks in her sleep and can talk and hold a conversation in her sleep. Not acting out dreams. She has decreased smell and taste. No problems swallowing. No dyskinesias.    10/02/2019: Patient is here with her husband, this is the first time I have met her husband, we revieiwed patient's workup, history, I answered any questions and we discussed the diagnosis of parkinson's disease and I provided much information for patient and husband, we discussed exercise and reviewed lots of exercise options for PD and how important that is, patient states Feels like the Sinemet is really helping with tremor, no tremors since taking it, she feels her walking is k, she still uses a cane. She went to a urologist a few months ago and she was having constipation problems,  she was given miralax for 3 days and then a dulcolax. She is taking 1/2 the dose of miralax and since then she has been fine without miralax. She had a hiatal hernia and she had surgery and did great but coughed and it came loose and then she had the surgery again, she had an episode last May and she had a dry tablet and was eating breakfast and it was coming back up but this is not new, she threw up and no more incidents since then she tries to make sure she doesn't eat anything dry, she hashad to learn but this week she had some sticking in her throat with a dry tablet again and then she had some sticking and she spit up a little bit, she has slowed down and it is better and pill crusher helps. She had a swallow test earlier this year, and she has had a swallow test and follows with her GI doctor regularly and is monitoring, no falls, husband says he doesn't let her carry the groceries and he laughs and says he doesn't make her mow the lawn either and laughs. Husband has placed some bars around the house for safety and she now has a vaccuuming robot. She will use a cane or a rolling walker.  She does not have sleep apnea, she does not snore, husband doesn't seem to feel she is very active in her sleep but she does have vivid dreams and had them for years but appears to be better the last several years. She is on daily medication now for UTIs for 6 months Trimethorprin and following for that with urology. No dyskinesias.    Interval history June 30, 2019: Patient was seen in the past for thalamic stroke and tremor, we did note she had some mild parkinsonian symptoms at last appointment in 2019(she never followed up with [place]), we discussed further testing and a DaTscan at that time she agreed to the DaTscan but then changed her mind and declined, since I last saw her in 2019 she has been diagnosed with Parkinson's disease and she has already seen a neurologist.  She saw Dr. Sherryll Burger in March 2021, I reviewed his notes  which showed left upper extremity resting tremors, imbalance and history of REM sleep disorder, show shuffling gait, decreased left arm swing, slowness, stiffness, falls and constipation, diagnosed with parkinsonism likely idiopathic Parkinson's disease.  MRI  of the brain was ordered as well as vitamin B12 and vitamin D, she was started on carbidopa levodopa 3 times a day, MiraLAX Dulcolax and enema for constipation, she was talked to at length about her UTI, patient has a long history of UTIs.   Patient is here alone, As mentioned above, we saw her 2 years ago and recommend DatScan at that time which she declined but we suspected early parkinson's disease. In the 2 years(she never followed up) her symptoms have progressed and she is on sinemet currently see by another neurologist. She has left resting tremor, worsening, she had one fall but she denies falling more often, she is walking slowly, she reports vivid dreams. She doesn't feel her memory is as good (she doesn't remember meeting me in the past or [place] talking about her parkinson's symptoms). She has a son who is POA he lives in [place], other son lives in [place], daughter has done a lot of research on parkinson's disease she she has lots of support.    Patient complains of symptoms per HPI as well as the following symptoms: tremors . Pertinent negatives and positives per HPI. All others negative     Interval history 04/30/2017: Patient is here for follow-up of tremor.  Her last appointment we discussed that she had some mild parkinsonian symptoms.  Discussed the differential which includes Parkinson's disease.  The options were to start medication, follow clinically, or perform a DaTscan.  At that time she wanted to perform the DaTscan but she changed her mind.  Today we discussed parkinsonism in all the different reasons.  She continues to have tremors, resting and action.  Discussed Parkinson's disease, also essential tremor.  Discussed  medication management and Parkinson's disease including dopamine, dopamine agonists and others.  At this time she decided that she like to follow clinically which I agree with.  We will see her back in 6 months.  Otherwise she is doing excellent, she is playing tennis.    Interval history 12/25/2016; She has noticed tremor in her left hand noticed it first in may when driving and more recently when holding something heavy, she notices it at rest as well. She has some stress in her life. Happens every day, No caffeine. She is on aspirin. No tremor in the family. No FHx of Parkinson's. No slowing, no shuffling, no falls so far. No hallucinations or delusions. She reports poor memory. She uses a walker at home, she says she has slowed down.    HPI:  EZRA MARQUESS is a lovely 82 y.o. female here as a referral from Dr. Burnett Sheng for thalamic stroke. She has a past medical history of hypertension, hyperlipidemia, no diabetes. She presented to the hospital on February 24 after waking up with right foot and leg numbness. She also reported weakness. No other neurologic deficits. No aphasia, no dysphagia, no dysarthria, no other weakness or numbness, no altered mentation. Her husband gave her to aspirin may went to the emergency room. She had an MRI of the brain which was positive for stroke.  Sheis feeling much better. Hisband here and provides information as well. No more numbness and no more weakness. She feels scared that she had a stroke, doesn't understand why or where in the brain she had it. Never had a stroke before. There were no inciting events, she just woke up. No FHx of stroke.   Reviewed notes, labs and imaging from outside physicians, which showed:   ldl [id number]. [id number] wnl  Personally reviewed images with patient and her husband and agree with the following:   MRI HEAD FINDINGS   Calvarium and upper cervical spine: No focal marrow signal abnormality.   Orbits: Negative.   Sinuses and Mastoids:  Mucosal thickening on the left, overall mild. No effusion.   Brain: Ovoid 1 cm area of restricted diffusion in the lateral left thalamus, extending into the posterior limb internal capsule. No hemorrhagic conversion. No other territory of acute infarct. No evidence of major vessel occlusion.   Elsewhere, microvascular ischemic change is mild/expected for age. No previous infarct. No chronic hemorrhagic foci.   MRA HEAD FINDINGS   Symmetric carotid and vertebral arteries. Intact circle Willis with small anterior communicating artery.   Symmetric vertebrobasilar branching. Symmetric poor signal in distal vertebral and proximal basilar arteries is considered artifactual. These vessels have normal signal on T2 weighted conventional imaging.   Atherosclerotic type narrowings:   Distal left M1 segment high-grade tandem stenoses.   Moderate right proximal M2 segment stenosis.   Bilateral M3 narrowing, potentially overestimated due to artifact (poor signal in the bilateral A2 segments appears artifactual).   Bilateral atherosclerotic irregularity of the proximal PCA.     IMPRESSION: 1. 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 2. No acute arterial finding. 3. Intracranial atherosclerosis as described. No asymmetric disease in the proximal left PCA to correlate with #1.   Ultrasound carotids with no hemodynamically significant stenosis.    REVIEW OF SYSTEMS: Out of a complete 14 system review of symptoms, the patient complains only of the following symptoms, and all other reviewed systems are negative.  See HPI  ALLERGIES: Allergies  Allergen Reactions   Benzocaine Palpitations    Specific agent was Scandonest 2 %   Epinephrine Hcl (Nasal) Palpitations and Other (See Comments)    Reaction:  Tachycardia    Other Other (See Comments) and Palpitations    EPINEPHRINE Reaction:  Tachycardia    Amoxicillin-Pot Clavulanate Diarrhea and Nausea And Vomiting   Cefuroxime  Axetil Nausea Only   Nitrofurantoin Nausea And Vomiting   Omeprazole Diarrhea   Sucralfate Nausea Only   Trimethoprim Diarrhea and Nausea And Vomiting   Sulfa Antibiotics Swelling and Rash    HOME MEDICATIONS: Outpatient Medications Prior to Visit  Medication Sig Dispense Refill   aspirin EC 81 MG tablet Take 81 mg by mouth daily.     carbidopa-levodopa (SINEMET CR) 50-200 MG tablet Take 1 tablet by mouth at bedtime. 30 tablet 3   carbidopa-levodopa (SINEMET IR) 25-100 MG tablet 1-2 TABLET IN THE MORNING, MAY ALSO TAKE 1/2 TO 1 TABLET AT MIDDAY AND 1-2 IN THE EVENING before dinner. -Try to separate Sinemet from food (especially protein-rich foods like meat, dairy, eggs) by about 30-60 mins - this will help the absorption of the medication. If you have some nausea with the medication, you can take it with some light food like crackers or ginger ale 450 tablet 4   Cholecalciferol (VITAMIN D3) 5000 UNITS CAPS Take 5,000 Units by mouth once a week.      conjugated estrogens (PREMARIN) vaginal cream Apply one pea-sized amount around the opening of the urethra daily for 2 weeks, then 3 times weekly moving forward. 30 g 4   Cranberry-Vitamin C 84-20 MG CAPS Take 2 capsules by mouth daily.     Cyanocobalamin (B-12 PO) Take by mouth.     ondansetron (ZOFRAN-ODT) 4 MG disintegrating tablet TAKE 1 TABLET BY MOUTH EVERY 8 HOURS AS NEEDED FOR NAUSEA AND VOMITING  30 tablet 0   No facility-administered medications prior to visit.    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    PAST SURGICAL HISTORY: Past Surgical History:  Procedure Laterality Date   BACK SURGERY  05-09-11   x 2 lumbar   CHOLECYSTECTOMY  05-09-11   laparoscopic   COLONOSCOPY  2002, 2008    rectal hyperplastic polyp 2002, diverticulosis 2008   ESOPHAGOGASTRODUODENOSCOPY     HERNIA REPAIR  05/16/2011   hiatal hernia   HIATAL HERNIA REPAIR  02/09/2012   Procedure: LAPAROSCOPIC REPAIR OF HIATAL HERNIA;  Surgeon: Valarie Merino, MD;  Location: [facility];  Service: General;  Laterality: N/A;   INSERTION OF MESH  02/09/2012   Procedure: INSERTION OF MESH;  Surgeon: Valarie Merino, MD;  Location: [facility];  Service: General;;   IRRIGATION AND DEBRIDEMENT SEBACEOUS CYST  05/16/2011   Procedure: IRRIGATION AND DEBRIDEMENT SEBACEOUS CYST;  Surgeon: Valarie Merino, MD;  Location: [facility];  Service: General;  Laterality: N/A;  middle of chest on sternum   LAPAROSCOPIC NISSEN FUNDOPLICATION  05/16/2011   Procedure: LAPAROSCOPIC NISSEN FUNDOPLICATION;  Surgeon: Valarie Merino, MD;  Location: [facility];  Service: General;  Laterality: N/A;  will also remove sebaceous cyst of chest wall   LAPAROSCOPIC NISSEN FUNDOPLICATION  02/09/2012   Procedure: LAPAROSCOPIC NISSEN FUNDOPLICATION;  Surgeon: Valarie Merino, MD;  Location: [facility];  Service: General;  Laterality: N/A;  Re-do Laparoscopic Nissen    SEPTOPLASTY     TONSILLECTOMY  05-09-11   Tonsillectomy   UMBILICAL HERNIA REPAIR  05/16/2011   Procedure: HERNIA REPAIR UMBILICAL ADULT;  Surgeon: Valarie Merino, MD;  Location: [facility];  Service: General;  Laterality: N/A;  with mesh    UPPER GASTROINTESTINAL ENDOSCOPY     VAGINAL HYSTERECTOMY      FAMILY HISTORY: Family History  Problem Relation Age of Onset   Alzheimer's disease Mother    Stroke Mother    Diabetes Mother    Bipolar disorder Mother    Ulcers Father    Alcohol abuse Father    Ulcers Paternal Grandmother     Colon cancer Neg Hx    Rectal cancer Neg Hx    Stomach cancer Neg Hx    Esophageal cancer Neg Hx    Parkinson's disease Neg Hx     SOCIAL HISTORY: Social History   Socioeconomic History   Marital status: Married    Spouse name: [name]   Number of children: 3   Years of education: College   Highest education level: Not on file  Occupational History   Not on file  Tobacco Use   Smoking status: Never   Smokeless tobacco: Never  Vaping Use   Vaping status: Never Used  Substance and Sexual Activity   Alcohol use: Yes    Comment: occ   Drug use: No   Sexual activity:  Yes    Birth control/protection: Post-menopausal  Other Topics Concern   Not on file  Social History Narrative   Lives with husband.   Caffeine use: occasional coke   Right handed   Enjoys tennis   3 children    no tobacco or drug use   Occasional glass of wine at night    Social Determinants of Health   Financial Resource Strain: Patient Declined (06/27/2022)   Received from [facility], [facility]   Overall Financial Resource Strain (CARDIA)    Difficulty of Paying Living Expenses: Patient declined  Food Insecurity: Patient Declined (06/27/2022)   Received from [facility], [facility]   Hunger Vital Sign    Worried About Running Out of Food in the Last Year: Patient declined    Ran Out of Food in the Last Year: Patient declined  Transportation Needs: Patient Declined (06/27/2022)   Received from [facility], [facility]   PRAPARE - Transportation    In the past 12 months, has lack of transportation kept you from medical appointments or from getting medications?: Patient declined    Lack of Transportation (Non-Medical): Patient declined  Physical Activity: Not on file  Stress: Not on file  Social Connections: Not on file  Intimate Partner Violence: Not on file      PHYSICAL EXAM  There  were no vitals filed for this visit.  There is no height or weight on file to calculate BMI.     Generalized:  in no acute distress   Neurological examination  Mentation: Alert oriented to time, place, history taking. Follows all commands speech and language fluent Cranial nerve II-XII: Pupils were equal round reactive to light. Extraocular movements were full, visual field were full on confrontational test. Facial sensation and strength were normal.  Head turning and shoulder shrug  were normal and symmetric. Motor: The motor testing reveals 5 over 5 strength of all 4 extremities. Good symmetric motor tone is noted throughout.  Moderate-severe impairment finger taps and toe taps bilaterally.  Resting tremor noted in the upper extremities R>L Sensory: Sensory testing is intact to soft touch on all 4 extremities. No evidence of extinction is noted.  Coordination: Cerebellar testing reveals good finger-nose-finger and heel-to-shin bilaterally.  Gait and station: In a wheelchair today.  Did not bring her walker with her today.  Therefore due to fall risk we did not ambulate .   DIAGNOSTIC DATA (LABS, IMAGING, TESTING) - I reviewed patient records, labs, notes, testing and imaging myself where available.  Lab Results  Component Value Date   WBC 5.5 05/28/2022   HGB 12.8 05/28/2022   HCT 39.2 05/28/2022   MCV 89.7 05/28/2022   PLT 133 (L) 05/28/2022      Component Value Date/Time   NA 138 05/28/2022 0614   NA 141 12/25/2016 1153   NA 141 06/05/2011 0251   K 3.6 05/28/2022 0614   K 3.6 06/05/2011 0251   CL 107 05/28/2022 0614   CL 106 06/05/2011 0251   CO2 28 05/28/2022 0614   CO2 22 06/05/2011 0251   GLUCOSE 102 (H) 05/28/2022 0614   GLUCOSE 107 (H) 06/05/2011 0251   BUN 15 05/28/2022 0614   BUN 7 (L) 12/25/2016 1153   BUN 8 06/05/2011 0251   CREATININE 0.63 05/28/2022 0614   CREATININE 0.63 06/05/2011 0251   CALCIUM 9.2 05/28/2022 0614   CALCIUM 9.0 06/05/2011 0251   PROT  5.9 (L) 05/28/2022 0614   PROT 6.2 12/25/2016 1153   PROT 6.7 06/05/2011 0251   ALBUMIN 3.6 05/28/2022 0614   ALBUMIN 4.1 12/25/2016 1153   ALBUMIN 3.6 06/05/2011 0251   AST 19 05/28/2022 0614   AST 30 06/05/2011 0251   ALT 13 05/28/2022 0614   ALT 20 06/05/2011 0251   ALKPHOS 44 05/28/2022 0614   ALKPHOS 66 06/05/2011 0251   BILITOT 0.6 05/28/2022 0614   BILITOT 0.8 12/25/2016 1153   BILITOT 0.7 06/05/2011 0251   GFRNONAA >60 05/28/2022 0614   GFRNONAA >60 06/05/2011 0251   GFRAA >60 02/10/2019 1542   GFRAA >60 06/05/2011 0251   Lab Results  Component Value Date   CHOL 199 03/05/2017   HDL 49 03/05/2017   LDLCALC 127 (H) 03/05/2017   TRIG 117 03/05/2017   CHOLHDL 4.1 03/05/2017   Lab Results  Component Value Date   HGBA1C 5.5 03/05/2017   No results found for: "VITAMINB12" Lab Results  Component Value Date   TSH 0.034 (L) 11/05/2021      ASSESSMENT AND PLAN 82 y.o. 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 here with :  1.  Parkinson's disease  Increase  Sinemet 25-100 mg 2 tablets in AM and at Lunch and continue 1 tablet at bedtime. Discussed physical therapy however she deferred on referral.  States that she has someone that she knows that can help her do this on her own. Stay well-hydrated Advised if symptoms worsen or she develops new symptoms she should let us know Follow-up in 6 months or sooner if needed     Butch Penny, MSN, NP-C 12/24/2022, 1:36 PM [facility] [address], [address] [place], [place] [address] [phone]

## 2022-12-25 ENCOUNTER — Ambulatory Visit: Payer: HMO | Admitting: Adult Health

## 2022-12-26 NOTE — Telephone Encounter (Signed)
Rx refilled per last office visit note.

## 2022-12-27 DIAGNOSIS — F419 Anxiety disorder, unspecified: Secondary | ICD-10-CM | POA: Diagnosis not present

## 2022-12-27 DIAGNOSIS — K219 Gastro-esophageal reflux disease without esophagitis: Secondary | ICD-10-CM | POA: Diagnosis not present

## 2022-12-27 DIAGNOSIS — I10 Essential (primary) hypertension: Secondary | ICD-10-CM | POA: Diagnosis not present

## 2022-12-27 DIAGNOSIS — R1310 Dysphagia, unspecified: Secondary | ICD-10-CM | POA: Diagnosis not present

## 2022-12-27 DIAGNOSIS — I272 Pulmonary hypertension, unspecified: Secondary | ICD-10-CM | POA: Diagnosis not present

## 2022-12-27 DIAGNOSIS — G20A1 Parkinson's disease without dyskinesia, without mention of fluctuations: Secondary | ICD-10-CM | POA: Diagnosis not present

## 2022-12-27 DIAGNOSIS — N390 Urinary tract infection, site not specified: Secondary | ICD-10-CM | POA: Diagnosis not present

## 2022-12-27 DIAGNOSIS — I2510 Atherosclerotic heart disease of native coronary artery without angina pectoris: Secondary | ICD-10-CM | POA: Diagnosis not present

## 2022-12-27 DIAGNOSIS — Z7982 Long term (current) use of aspirin: Secondary | ICD-10-CM | POA: Diagnosis not present

## 2023-01-02 DIAGNOSIS — R059 Cough, unspecified: Secondary | ICD-10-CM | POA: Diagnosis not present

## 2023-01-03 DIAGNOSIS — R059 Cough, unspecified: Secondary | ICD-10-CM | POA: Diagnosis not present

## 2023-01-04 DIAGNOSIS — R5383 Other fatigue: Secondary | ICD-10-CM | POA: Diagnosis not present

## 2023-01-04 DIAGNOSIS — R059 Cough, unspecified: Secondary | ICD-10-CM | POA: Diagnosis not present

## 2023-01-06 DIAGNOSIS — M25511 Pain in right shoulder: Secondary | ICD-10-CM | POA: Diagnosis not present

## 2023-01-06 DIAGNOSIS — R519 Headache, unspecified: Secondary | ICD-10-CM | POA: Diagnosis not present

## 2023-01-06 DIAGNOSIS — S0003XA Contusion of scalp, initial encounter: Secondary | ICD-10-CM | POA: Diagnosis not present

## 2023-01-06 DIAGNOSIS — S199XXA Unspecified injury of neck, initial encounter: Secondary | ICD-10-CM | POA: Diagnosis not present

## 2023-01-10 DIAGNOSIS — F419 Anxiety disorder, unspecified: Secondary | ICD-10-CM | POA: Diagnosis not present

## 2023-01-10 DIAGNOSIS — S0181XA Laceration without foreign body of other part of head, initial encounter: Secondary | ICD-10-CM | POA: Diagnosis not present

## 2023-01-10 DIAGNOSIS — F32A Depression, unspecified: Secondary | ICD-10-CM | POA: Diagnosis not present

## 2023-01-10 DIAGNOSIS — M1909 Primary osteoarthritis, other specified site: Secondary | ICD-10-CM | POA: Diagnosis not present

## 2023-01-10 DIAGNOSIS — M8580 Other specified disorders of bone density and structure, unspecified site: Secondary | ICD-10-CM | POA: Diagnosis not present

## 2023-01-10 DIAGNOSIS — G20A1 Parkinson's disease without dyskinesia, without mention of fluctuations: Secondary | ICD-10-CM | POA: Diagnosis not present

## 2023-01-10 DIAGNOSIS — R918 Other nonspecific abnormal finding of lung field: Secondary | ICD-10-CM | POA: Diagnosis not present

## 2023-01-10 DIAGNOSIS — S0219XA Other fracture of base of skull, initial encounter for closed fracture: Secondary | ICD-10-CM | POA: Diagnosis not present

## 2023-01-10 DIAGNOSIS — S0121XA Laceration without foreign body of nose, initial encounter: Secondary | ICD-10-CM | POA: Diagnosis not present

## 2023-01-10 DIAGNOSIS — Z23 Encounter for immunization: Secondary | ICD-10-CM | POA: Diagnosis not present

## 2023-01-10 DIAGNOSIS — Z79899 Other long term (current) drug therapy: Secondary | ICD-10-CM | POA: Diagnosis not present

## 2023-01-10 DIAGNOSIS — S022XXA Fracture of nasal bones, initial encounter for closed fracture: Secondary | ICD-10-CM | POA: Diagnosis not present

## 2023-01-10 DIAGNOSIS — R296 Repeated falls: Secondary | ICD-10-CM | POA: Diagnosis not present

## 2023-01-10 DIAGNOSIS — M25531 Pain in right wrist: Secondary | ICD-10-CM | POA: Diagnosis not present

## 2023-01-10 DIAGNOSIS — S0003XA Contusion of scalp, initial encounter: Secondary | ICD-10-CM | POA: Diagnosis not present

## 2023-01-11 DIAGNOSIS — Z043 Encounter for examination and observation following other accident: Secondary | ICD-10-CM | POA: Diagnosis not present

## 2023-01-11 DIAGNOSIS — S199XXA Unspecified injury of neck, initial encounter: Secondary | ICD-10-CM | POA: Diagnosis not present

## 2023-01-11 DIAGNOSIS — S022XXA Fracture of nasal bones, initial encounter for closed fracture: Secondary | ICD-10-CM | POA: Diagnosis not present

## 2023-01-11 DIAGNOSIS — T1490XA Injury, unspecified, initial encounter: Secondary | ICD-10-CM | POA: Diagnosis not present

## 2023-01-11 DIAGNOSIS — I498 Other specified cardiac arrhythmias: Secondary | ICD-10-CM | POA: Diagnosis not present

## 2023-01-11 DIAGNOSIS — S3993XA Unspecified injury of pelvis, initial encounter: Secondary | ICD-10-CM | POA: Diagnosis not present

## 2023-01-11 DIAGNOSIS — M25531 Pain in right wrist: Secondary | ICD-10-CM | POA: Diagnosis not present

## 2023-01-12 DIAGNOSIS — R296 Repeated falls: Secondary | ICD-10-CM | POA: Diagnosis not present

## 2023-01-17 DIAGNOSIS — G20A1 Parkinson's disease without dyskinesia, without mention of fluctuations: Secondary | ICD-10-CM | POA: Diagnosis not present

## 2023-01-17 DIAGNOSIS — I10 Essential (primary) hypertension: Secondary | ICD-10-CM | POA: Diagnosis not present

## 2023-01-17 DIAGNOSIS — K219 Gastro-esophageal reflux disease without esophagitis: Secondary | ICD-10-CM | POA: Diagnosis not present

## 2023-01-18 DIAGNOSIS — S022XXA Fracture of nasal bones, initial encounter for closed fracture: Secondary | ICD-10-CM | POA: Diagnosis not present

## 2023-01-18 DIAGNOSIS — R2681 Unsteadiness on feet: Secondary | ICD-10-CM | POA: Diagnosis not present

## 2023-01-18 DIAGNOSIS — F419 Anxiety disorder, unspecified: Secondary | ICD-10-CM | POA: Diagnosis not present

## 2023-01-18 DIAGNOSIS — I10 Essential (primary) hypertension: Secondary | ICD-10-CM | POA: Diagnosis not present

## 2023-01-18 DIAGNOSIS — W010XXA Fall on same level from slipping, tripping and stumbling without subsequent striking against object, initial encounter: Secondary | ICD-10-CM | POA: Diagnosis not present

## 2023-02-06 DIAGNOSIS — I10 Essential (primary) hypertension: Secondary | ICD-10-CM | POA: Diagnosis not present

## 2023-02-06 DIAGNOSIS — F419 Anxiety disorder, unspecified: Secondary | ICD-10-CM | POA: Diagnosis not present

## 2023-02-26 DIAGNOSIS — D519 Vitamin B12 deficiency anemia, unspecified: Secondary | ICD-10-CM | POA: Diagnosis not present

## 2023-02-26 DIAGNOSIS — G20A1 Parkinson's disease without dyskinesia, without mention of fluctuations: Secondary | ICD-10-CM | POA: Diagnosis not present

## 2023-02-26 DIAGNOSIS — E559 Vitamin D deficiency, unspecified: Secondary | ICD-10-CM | POA: Diagnosis not present

## 2023-02-26 DIAGNOSIS — R7309 Other abnormal glucose: Secondary | ICD-10-CM | POA: Diagnosis not present

## 2023-03-01 DIAGNOSIS — Z79899 Other long term (current) drug therapy: Secondary | ICD-10-CM | POA: Diagnosis not present

## 2023-03-01 DIAGNOSIS — D559 Anemia due to enzyme disorder, unspecified: Secondary | ICD-10-CM | POA: Diagnosis not present

## 2023-03-01 DIAGNOSIS — F419 Anxiety disorder, unspecified: Secondary | ICD-10-CM | POA: Diagnosis not present

## 2023-03-01 DIAGNOSIS — M160 Bilateral primary osteoarthritis of hip: Secondary | ICD-10-CM | POA: Diagnosis not present

## 2023-03-01 DIAGNOSIS — Z9049 Acquired absence of other specified parts of digestive tract: Secondary | ICD-10-CM | POA: Diagnosis not present

## 2023-03-01 DIAGNOSIS — S022XXD Fracture of nasal bones, subsequent encounter for fracture with routine healing: Secondary | ICD-10-CM | POA: Diagnosis not present

## 2023-03-01 DIAGNOSIS — S02101D Fracture of base of skull, right side, subsequent encounter for fracture with routine healing: Secondary | ICD-10-CM | POA: Diagnosis not present

## 2023-03-01 DIAGNOSIS — Z9181 History of falling: Secondary | ICD-10-CM | POA: Diagnosis not present

## 2023-03-01 DIAGNOSIS — M461 Sacroiliitis, not elsewhere classified: Secondary | ICD-10-CM | POA: Diagnosis not present

## 2023-03-01 DIAGNOSIS — K219 Gastro-esophageal reflux disease without esophagitis: Secondary | ICD-10-CM | POA: Diagnosis not present

## 2023-03-01 DIAGNOSIS — R1310 Dysphagia, unspecified: Secondary | ICD-10-CM | POA: Diagnosis not present

## 2023-03-01 DIAGNOSIS — Z90710 Acquired absence of both cervix and uterus: Secondary | ICD-10-CM | POA: Diagnosis not present

## 2023-03-01 DIAGNOSIS — E559 Vitamin D deficiency, unspecified: Secondary | ICD-10-CM | POA: Diagnosis not present

## 2023-03-01 DIAGNOSIS — Z7982 Long term (current) use of aspirin: Secondary | ICD-10-CM | POA: Diagnosis not present

## 2023-03-01 DIAGNOSIS — I272 Pulmonary hypertension, unspecified: Secondary | ICD-10-CM | POA: Diagnosis not present

## 2023-03-01 DIAGNOSIS — D619 Aplastic anemia, unspecified: Secondary | ICD-10-CM | POA: Diagnosis not present

## 2023-03-01 DIAGNOSIS — Z9851 Tubal ligation status: Secondary | ICD-10-CM | POA: Diagnosis not present

## 2023-03-01 DIAGNOSIS — D519 Vitamin B12 deficiency anemia, unspecified: Secondary | ICD-10-CM | POA: Diagnosis not present

## 2023-03-01 DIAGNOSIS — G20B1 Parkinson's disease with dyskinesia, without mention of fluctuations: Secondary | ICD-10-CM | POA: Diagnosis not present

## 2023-03-01 DIAGNOSIS — M47817 Spondylosis without myelopathy or radiculopathy, lumbosacral region: Secondary | ICD-10-CM | POA: Diagnosis not present

## 2023-03-01 DIAGNOSIS — F32A Depression, unspecified: Secondary | ICD-10-CM | POA: Diagnosis not present

## 2023-03-01 DIAGNOSIS — Z87440 Personal history of urinary (tract) infections: Secondary | ICD-10-CM | POA: Diagnosis not present

## 2023-03-01 DIAGNOSIS — M8580 Other specified disorders of bone density and structure, unspecified site: Secondary | ICD-10-CM | POA: Diagnosis not present

## 2023-03-16 DIAGNOSIS — I10 Essential (primary) hypertension: Secondary | ICD-10-CM | POA: Diagnosis not present

## 2023-03-16 DIAGNOSIS — F419 Anxiety disorder, unspecified: Secondary | ICD-10-CM | POA: Diagnosis not present

## 2023-04-26 ENCOUNTER — Encounter: Payer: Self-pay | Admitting: Adult Health

## 2023-04-26 ENCOUNTER — Telehealth: Payer: Self-pay | Admitting: Adult Health

## 2023-04-26 NOTE — Telephone Encounter (Signed)
 LVM and sent letter in mail informing pt of need to reschedule 09/03/23 appt - NP schedule change

## 2023-09-03 ENCOUNTER — Ambulatory Visit: Payer: HMO | Admitting: Adult Health
